# Patient Record
Sex: Male | Born: 1940 | Race: White | Hispanic: No | Smoking: Former smoker
Health system: Southern US, Community
[De-identification: ages and names within clinical notes are randomized; demographics above are authoritative.]

## PROBLEM LIST (undated history)

## (undated) DIAGNOSIS — M545 Low back pain, unspecified: Secondary | ICD-10-CM

## (undated) DIAGNOSIS — I10 Essential (primary) hypertension: Secondary | ICD-10-CM

## (undated) DIAGNOSIS — E785 Hyperlipidemia, unspecified: Secondary | ICD-10-CM

## (undated) DIAGNOSIS — M109 Gout, unspecified: Secondary | ICD-10-CM

## (undated) DIAGNOSIS — N182 Chronic kidney disease, stage 2 (mild): Secondary | ICD-10-CM

## (undated) DIAGNOSIS — K8020 Calculus of gallbladder without cholecystitis without obstruction: Secondary | ICD-10-CM

## (undated) DIAGNOSIS — F32A Depression, unspecified: Secondary | ICD-10-CM

## (undated) DIAGNOSIS — E119 Type 2 diabetes mellitus without complications: Secondary | ICD-10-CM

## (undated) DIAGNOSIS — K760 Fatty (change of) liver, not elsewhere classified: Secondary | ICD-10-CM

## (undated) DIAGNOSIS — F329 Major depressive disorder, single episode, unspecified: Secondary | ICD-10-CM

## (undated) DIAGNOSIS — E669 Obesity, unspecified: Secondary | ICD-10-CM

## (undated) DIAGNOSIS — M25559 Pain in unspecified hip: Secondary | ICD-10-CM

## (undated) DIAGNOSIS — J90 Pleural effusion, not elsewhere classified: Secondary | ICD-10-CM

## (undated) HISTORY — DX: Essential (primary) hypertension: I10

## (undated) HISTORY — DX: Obesity, unspecified: E66.9

## (undated) HISTORY — DX: Chronic kidney disease, stage 2 (mild): N18.2

## (undated) HISTORY — DX: Low back pain: M54.5

## (undated) HISTORY — DX: Hyperlipidemia, unspecified: E78.5

## (undated) HISTORY — DX: Low back pain, unspecified: M54.50

## (undated) HISTORY — DX: Major depressive disorder, single episode, unspecified: F32.9

## (undated) HISTORY — DX: Type 2 diabetes mellitus without complications: E11.9

## (undated) HISTORY — DX: Pain in unspecified hip: M25.559

## (undated) HISTORY — DX: Gout, unspecified: M10.9

## (undated) HISTORY — DX: Depression, unspecified: F32.A

## (undated) HISTORY — PX: APPENDECTOMY: SHX54

---

## 1996-04-17 HISTORY — PX: CYST EXCISION: SHX5701

## 2005-01-03 ENCOUNTER — Ambulatory Visit: Payer: Self-pay | Admitting: Internal Medicine

## 2008-03-16 ENCOUNTER — Encounter: Admission: RE | Admit: 2008-03-16 | Discharge: 2008-03-16 | Payer: Self-pay | Admitting: Internal Medicine

## 2009-04-17 HISTORY — PX: TOTAL NEPHRECTOMY: SHX415

## 2009-08-19 ENCOUNTER — Encounter: Admission: RE | Admit: 2009-08-19 | Discharge: 2009-08-19 | Payer: Self-pay | Admitting: Internal Medicine

## 2009-08-30 ENCOUNTER — Encounter: Admission: RE | Admit: 2009-08-30 | Discharge: 2009-08-30 | Payer: Self-pay | Admitting: Internal Medicine

## 2009-09-29 ENCOUNTER — Encounter: Admission: RE | Admit: 2009-09-29 | Discharge: 2009-09-29 | Payer: Self-pay | Admitting: Urology

## 2009-10-06 ENCOUNTER — Encounter: Payer: Self-pay | Admitting: Urology

## 2009-10-06 ENCOUNTER — Inpatient Hospital Stay (HOSPITAL_COMMUNITY): Admission: RE | Admit: 2009-10-06 | Discharge: 2009-10-09 | Payer: Self-pay | Admitting: Urology

## 2010-05-03 ENCOUNTER — Encounter
Admission: RE | Admit: 2010-05-03 | Discharge: 2010-05-03 | Payer: Self-pay | Source: Home / Self Care | Attending: Internal Medicine | Admitting: Internal Medicine

## 2010-07-03 LAB — BASIC METABOLIC PANEL
BUN: 16 mg/dL (ref 6–23)
BUN: 25 mg/dL — ABNORMAL HIGH (ref 6–23)
CO2: 28 mEq/L (ref 19–32)
CO2: 31 mEq/L (ref 19–32)
Calcium: 10 mg/dL (ref 8.4–10.5)
Calcium: 8.1 mg/dL — ABNORMAL LOW (ref 8.4–10.5)
Calcium: 8.8 mg/dL (ref 8.4–10.5)
Chloride: 98 mEq/L (ref 96–112)
Creatinine, Ser: 0.91 mg/dL (ref 0.4–1.5)
Creatinine, Ser: 1.8 mg/dL — ABNORMAL HIGH (ref 0.4–1.5)
GFR calc Af Amer: 46 mL/min — ABNORMAL LOW (ref >60)
GFR calc Af Amer: 52 mL/min — ABNORMAL LOW (ref >60)
GFR calc Af Amer: >60 mL/min (ref >60)
GFR calc non Af Amer: 43 mL/min — ABNORMAL LOW (ref >60)
GFR calc non Af Amer: >60 mL/min (ref >60)
Sodium: 141 mEq/L (ref 135–145)

## 2010-07-03 LAB — GLUCOSE, CAPILLARY
Glucose-Capillary: 116 mg/dL — ABNORMAL HIGH (ref 70–99)
Glucose-Capillary: 119 mg/dL — ABNORMAL HIGH (ref 70–99)
Glucose-Capillary: 121 mg/dL — ABNORMAL HIGH (ref 70–99)
Glucose-Capillary: 126 mg/dL — ABNORMAL HIGH (ref 70–99)
Glucose-Capillary: 143 mg/dL — ABNORMAL HIGH (ref 70–99)
Glucose-Capillary: 164 mg/dL — ABNORMAL HIGH (ref 70–99)
Glucose-Capillary: 184 mg/dL — ABNORMAL HIGH (ref 70–99)

## 2010-07-03 LAB — CBC
HCT: 42.5 % (ref 39.0–52.0)
HCT: 47.3 % (ref 39.0–52.0)
Hemoglobin: 14.3 g/dL (ref 13.0–17.0)
Hemoglobin: 15.6 g/dL (ref 13.0–17.0)
MCH: 30.3 pg (ref 26.0–34.0)
MCH: 30.5 pg (ref 26.0–34.0)
MCHC: 33 g/dL (ref 30.0–36.0)
MCHC: 33.3 g/dL (ref 30.0–36.0)
MCV: 90.7 fL (ref 78.0–100.0)
MCV: 91 fL (ref 78.0–100.0)
MCV: 91.1 fL (ref 78.0–100.0)
Platelets: 192 10*3/uL (ref 150–400)
Platelets: 211 10*3/uL (ref 150–400)
Platelets: 218 10*3/uL (ref 150–400)
RDW: 13.7 % (ref 11.5–15.5)
RDW: 14 % (ref 11.5–15.5)
WBC: 6.8 10*3/uL (ref 4.0–10.5)

## 2010-07-03 LAB — SURGICAL PCR SCREEN: Staphylococcus aureus: NEGATIVE

## 2010-07-03 LAB — DIFFERENTIAL
Eosinophils Relative: 0 % (ref 0–5)
Lymphs Abs: 0.5 10*3/uL — ABNORMAL LOW (ref 0.7–4.0)
Monocytes Absolute: 0.9 10*3/uL (ref 0.1–1.0)
Neutro Abs: 9.7 10*3/uL — ABNORMAL HIGH (ref 1.7–7.7)
Neutrophils Relative %: 87 % — ABNORMAL HIGH (ref 43–77)

## 2011-11-28 IMAGING — CR DG ORBITS FOR FOREIGN BODY
2 series · 2 of 2 positions shown · non-contrast
Comparison: None available.

CLINICAL DATA: Metal exposure.

ORBITS FOR FOREIGN BODY - 2 VIEW

[view not recorded (1 of 2)]
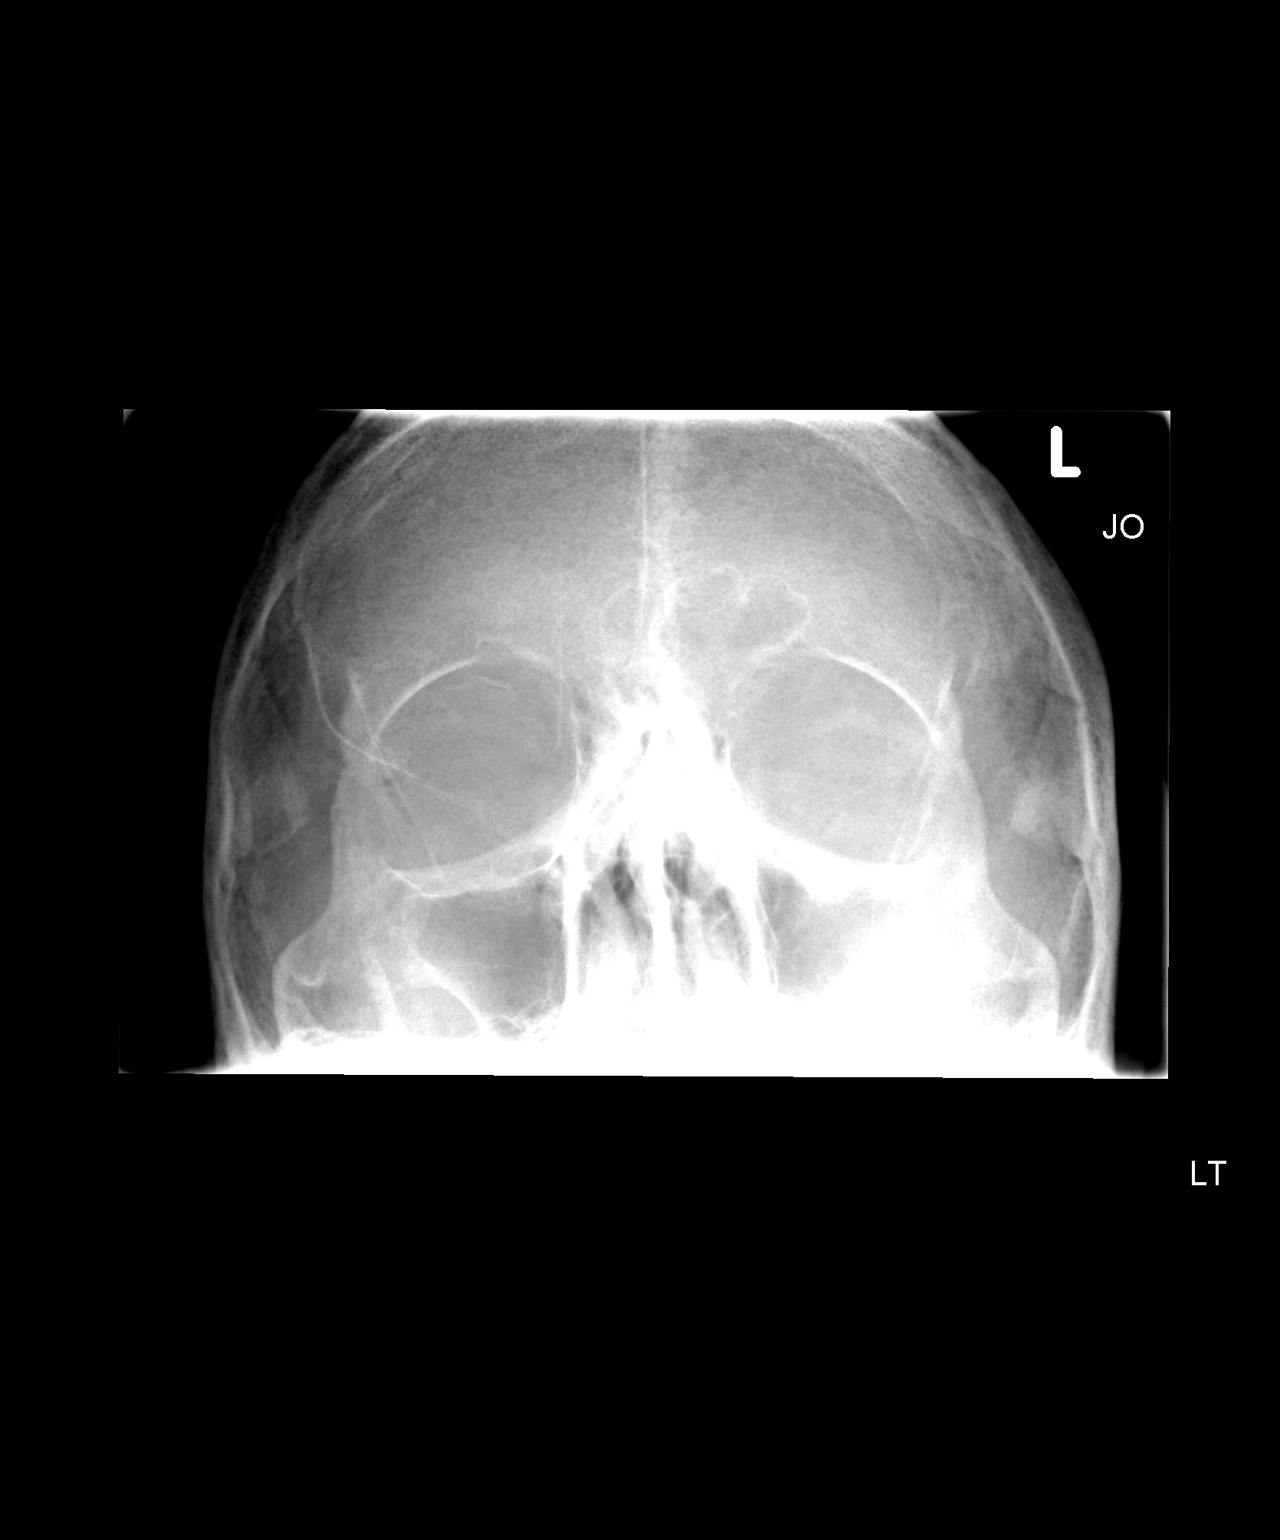

[view not recorded (2 of 2)]
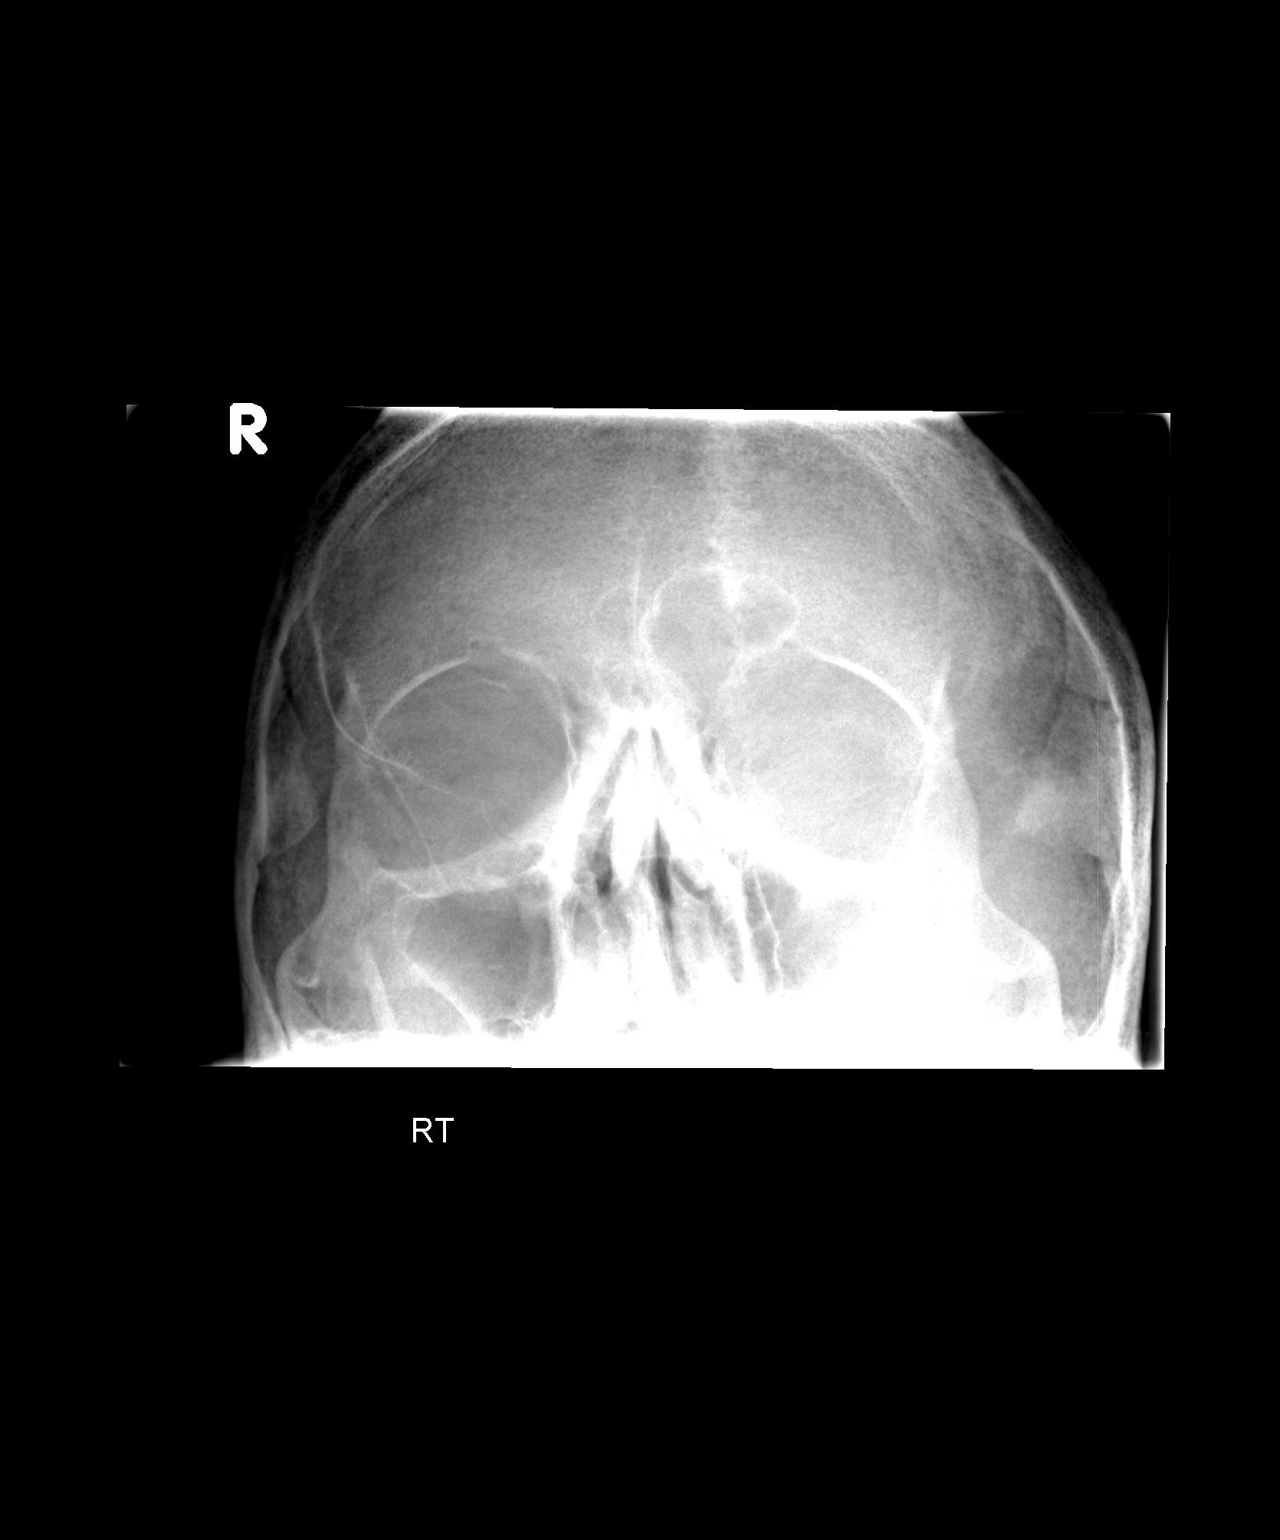

[2 of 2 positions shown; findings below may reference images not displayed]

FINDINGS: Two views the orbits demonstrate no radiopaque foreign
bodies.  The visualized paranasal sinuses are clear.  The left
maxillary sinus is smaller than the right, suggesting probable
chronic disease.
IMPRESSION: No radiopaque foreign body.

## 2012-07-16 ENCOUNTER — Other Ambulatory Visit: Payer: Medicare Other

## 2012-07-16 ENCOUNTER — Other Ambulatory Visit: Payer: Self-pay | Admitting: Geriatric Medicine

## 2012-07-16 DIAGNOSIS — E785 Hyperlipidemia, unspecified: Secondary | ICD-10-CM

## 2012-07-16 DIAGNOSIS — E119 Type 2 diabetes mellitus without complications: Secondary | ICD-10-CM

## 2012-07-16 DIAGNOSIS — N182 Chronic kidney disease, stage 2 (mild): Secondary | ICD-10-CM

## 2012-07-16 DIAGNOSIS — M109 Gout, unspecified: Secondary | ICD-10-CM

## 2012-07-17 LAB — COMPREHENSIVE METABOLIC PANEL
ALT: 14 IU/L (ref 0–44)
Albumin/Globulin Ratio: 1.8 (ref 1.1–2.5)
Alkaline Phosphatase: 66 IU/L (ref 39–117)
BUN/Creatinine Ratio: 18 (ref 10–22)
GFR calc Af Amer: 61 mL/min/{1.73_m2} (ref >59)
GFR calc non Af Amer: 52 mL/min/{1.73_m2} — ABNORMAL LOW (ref >59)
Glucose: 185 mg/dL — ABNORMAL HIGH (ref 65–99)
Potassium: 4.3 mmol/L (ref 3.5–5.2)
Total Bilirubin: 0.5 mg/dL (ref 0.0–1.2)
Total Protein: 6.2 g/dL (ref 6.0–8.5)

## 2012-07-17 LAB — URIC ACID: Uric Acid: 5.2 mg/dL (ref 3.7–8.6)

## 2012-07-17 LAB — HEMOGLOBIN A1C
Est. average glucose Bld gHb Est-mCnc: 160 mg/dL
Hgb A1c MFr Bld: 7.2 % — ABNORMAL HIGH (ref 4.8–5.6)

## 2012-07-17 LAB — LIPID PANEL
LDL Calculated: 119 mg/dL — ABNORMAL HIGH (ref 0–99)
VLDL Cholesterol Cal: 27 mg/dL (ref 5–40)

## 2012-07-23 ENCOUNTER — Other Ambulatory Visit: Payer: Self-pay | Admitting: *Deleted

## 2012-07-23 MED ORDER — HYDROCODONE-ACETAMINOPHEN 10-325 MG PO TABS: 1.0000 | ORAL_TABLET | Freq: Four times a day (QID) | ORAL | Status: DC | PRN

## 2012-07-24 ENCOUNTER — Ambulatory Visit: Payer: Medicare Other | Admitting: Internal Medicine

## 2012-08-14 ENCOUNTER — Encounter: Payer: Self-pay | Admitting: Geriatric Medicine

## 2012-08-14 ENCOUNTER — Ambulatory Visit: Payer: Medicare Other | Admitting: Internal Medicine

## 2012-08-15 ENCOUNTER — Ambulatory Visit: Payer: Medicare Other | Admitting: Internal Medicine

## 2012-08-26 ENCOUNTER — Other Ambulatory Visit: Payer: Self-pay | Admitting: *Deleted

## 2012-08-26 MED ORDER — HYDROCODONE-ACETAMINOPHEN 10-325 MG PO TABS: 1.0000 | ORAL_TABLET | Freq: Four times a day (QID) | ORAL | Status: DC | PRN

## 2012-09-17 ENCOUNTER — Other Ambulatory Visit: Payer: Self-pay | Admitting: *Deleted

## 2012-09-17 MED ORDER — DOXAZOSIN MESYLATE 8 MG PO TABS: ORAL_TABLET | ORAL | Status: DC

## 2012-10-22 ENCOUNTER — Other Ambulatory Visit: Payer: Self-pay | Admitting: Geriatric Medicine

## 2012-10-22 MED ORDER — OLMESARTAN-AMLODIPINE-HCTZ 40-10-25 MG PO TABS: ORAL_TABLET | ORAL | Status: DC

## 2012-10-22 MED ORDER — METFORMIN HCL 500 MG PO TABS: ORAL_TABLET | ORAL | Status: DC

## 2012-11-14 ENCOUNTER — Other Ambulatory Visit: Payer: Self-pay | Admitting: Geriatric Medicine

## 2012-11-14 DIAGNOSIS — E119 Type 2 diabetes mellitus without complications: Secondary | ICD-10-CM

## 2012-11-14 DIAGNOSIS — M109 Gout, unspecified: Secondary | ICD-10-CM

## 2012-11-14 DIAGNOSIS — E785 Hyperlipidemia, unspecified: Secondary | ICD-10-CM

## 2012-11-14 DIAGNOSIS — N182 Chronic kidney disease, stage 2 (mild): Secondary | ICD-10-CM

## 2012-11-15 ENCOUNTER — Other Ambulatory Visit: Payer: Medicare Other

## 2012-11-15 DIAGNOSIS — N182 Chronic kidney disease, stage 2 (mild): Secondary | ICD-10-CM

## 2012-11-15 DIAGNOSIS — M109 Gout, unspecified: Secondary | ICD-10-CM

## 2012-11-15 DIAGNOSIS — E119 Type 2 diabetes mellitus without complications: Secondary | ICD-10-CM

## 2012-11-15 DIAGNOSIS — E785 Hyperlipidemia, unspecified: Secondary | ICD-10-CM

## 2012-11-16 LAB — COMPREHENSIVE METABOLIC PANEL
AST: 16 IU/L (ref 0–40)
Alkaline Phosphatase: 63 IU/L (ref 39–117)
BUN/Creatinine Ratio: 16 (ref 10–22)
BUN: 26 mg/dL (ref 8–27)
CO2: 26 mmol/L (ref 18–29)
Chloride: 98 mmol/L (ref 97–108)
Sodium: 139 mmol/L (ref 134–144)

## 2012-11-16 LAB — URIC ACID: Uric Acid: 10.2 mg/dL — ABNORMAL HIGH (ref 3.7–8.6)

## 2012-11-16 LAB — LIPID PANEL
Cholesterol, Total: 189 mg/dL (ref 100–199)
Triglycerides: 140 mg/dL (ref 0–149)

## 2012-11-20 ENCOUNTER — Encounter: Payer: Self-pay | Admitting: Internal Medicine

## 2012-11-20 ENCOUNTER — Ambulatory Visit (INDEPENDENT_AMBULATORY_CARE_PROVIDER_SITE_OTHER): Payer: Medicare Other | Admitting: Internal Medicine

## 2012-11-20 VITALS — BP 190/70 | HR 69 | Temp 97.4°F | Resp 18 | Wt 232.4 lb

## 2012-11-20 DIAGNOSIS — M25559 Pain in unspecified hip: Secondary | ICD-10-CM

## 2012-11-20 DIAGNOSIS — M25551 Pain in right hip: Secondary | ICD-10-CM

## 2012-11-20 DIAGNOSIS — E785 Hyperlipidemia, unspecified: Secondary | ICD-10-CM

## 2012-11-20 DIAGNOSIS — M109 Gout, unspecified: Secondary | ICD-10-CM | POA: Insufficient documentation

## 2012-11-20 DIAGNOSIS — M25552 Pain in left hip: Secondary | ICD-10-CM | POA: Insufficient documentation

## 2012-11-20 DIAGNOSIS — N179 Acute kidney failure, unspecified: Secondary | ICD-10-CM | POA: Insufficient documentation

## 2012-11-20 DIAGNOSIS — E119 Type 2 diabetes mellitus without complications: Secondary | ICD-10-CM

## 2012-11-20 DIAGNOSIS — E669 Obesity, unspecified: Secondary | ICD-10-CM

## 2012-11-20 DIAGNOSIS — I10 Essential (primary) hypertension: Secondary | ICD-10-CM | POA: Insufficient documentation

## 2012-11-20 DIAGNOSIS — M545 Low back pain: Secondary | ICD-10-CM

## 2012-11-20 DIAGNOSIS — N182 Chronic kidney disease, stage 2 (mild): Secondary | ICD-10-CM

## 2012-11-20 NOTE — Patient Instructions (Addendum)
Continue current medications. Resume allopurinol to prevent gout. Try Aleve twice daily to help hip pain.

## 2012-11-20 NOTE — Progress Notes (Signed)
Subjective:    Patient ID: Douglas Soto, male    DOB: 26-Nov-1940, 72 y.o.   MRN: [id number]  HPI   Gout, unspecified: he sopped the allopurinol. Did not understaqnd it will prevent gout attaqcks.  Chronic kidney disease, stage II (mild): stable  Essential hypertension, malignant; continues with elevated SBP in office, but it is ,140 at home.  Lumbago: chronic and unchanged.  Diabetes mellitus without complication: adequate control  Hyperlipidemia: controlled  Obesity, unspecified: unchanged  Hip pain, bilateral: Hurting more in his hips. Cannot walk more than 100' without having to stop due to pain. Both hips affected.   Current Outpatient Prescriptions on File Prior to Visit  Medication Sig Dispense Refill  . allopurinol (ZYLOPRIM) 300 MG tablet       . doxazosin (CARDURA) 8 MG tablet Take one tablet by mouth once daily for blood pressure  30 tablet  6  . HYDROcodone-acetaminophen (NORCO) 10-325 MG per tablet Take 1 tablet by mouth every 6 (six) hours as needed for pain.  120 tablet  0  . metFORMIN (GLUCOPHAGE) 500 MG tablet Take one tablet by mouth once daily to control diabetes.  30 tablet  3  . multivitamin-iron-minerals-folic acid (CENTRUM) chewable tablet Chew 1 tablet by mouth daily.      . Olmesartan-Amlodipine-HCTZ (TRIBENZOR) 40-10-25 MG TABS Take one tablet by mouth once daily for blood pressure.  30 tablet  3   No current facility-administered medications on file prior to visit.    Review of Systems  Constitutional: Negative.   HENT: Negative.   Eyes: Negative.   Respiratory: Negative.   Cardiovascular: Negative for chest pain, palpitations and leg swelling.  Gastrointestinal: Negative.   Endocrine: Negative for heat intolerance, polydipsia, polyphagia and polyuria.       Diabetic  Genitourinary: Negative.   Musculoskeletal: Positive for back pain.       Hx gout  Skin: Negative.   Neurological: Negative.   Hematological: Negative.    Psychiatric/Behavioral: Negative.        Objective:BP 190/70  Pulse 69  Temp(Src) 97.4 F (36.3 C) (Oral)  Resp 18  Wt 232 lb 6.4 oz (105.416 kg)  SpO2 95%    Physical Exam  Constitutional: He is oriented to person, place, and time. No distress.  overweight  HENT:  Head: Normocephalic and atraumatic.  Right Ear: External ear normal.  Left Ear: External ear normal.  Eyes: Conjunctivae and EOM are normal. Pupils are equal, round, and reactive to light.  Neck: Normal range of motion. Neck supple. No JVD present. No tracheal deviation present. No thyromegaly present.  Cardiovascular: Normal rate, regular rhythm, normal heart sounds and intact distal pulses.  Exam reveals no gallop and no friction rub.   No murmur heard. Pulmonary/Chest: Effort normal and breath sounds normal. No respiratory distress. He has no wheezes. He has no rales.  Abdominal: Soft. Bowel sounds are normal. He exhibits no distension and no mass. There is no tenderness.  Musculoskeletal: Normal range of motion. He exhibits no edema.  Tender in lower back and hips.  Lymphadenopathy:    He has no cervical adenopathy.  Neurological: He is alert and oriented to person, place, and time. He has normal reflexes. No cranial nerve deficit. Coordination normal.  Skin: No rash noted. No erythema. No pallor.  Psychiatric: He has a normal mood and affect. His behavior is normal. Judgment and thought content normal.      Appointment on 11/15/2012  Component Date Value Range Status  . Glucose  11/15/2012 91  65 - 99 mg/dL Final  . BUN 78/29/5621 26  8 - 27 mg/dL Final  . Creatinine, Ser 11/15/2012 1.62* 0.76 - 1.27 mg/dL Final  . GFR calc non Af Amer 11/15/2012 42* >59 mL/min/1.73 Final  . GFR calc Af Amer 11/15/2012 49* >59 mL/min/1.73 Final  . BUN/Creatinine Ratio 11/15/2012 16  10 - 22 Final  . Sodium 11/15/2012 139  134 - 144 mmol/L Final  . Potassium 11/15/2012 4.1  3.5 - 5.2 mmol/L Final  . Chloride 11/15/2012 98   97 - 108 mmol/L Final  . CO2 11/15/2012 26  18 - 29 mmol/L Final  . Calcium 11/15/2012 10.0  8.6 - 10.2 mg/dL Final  . Total Protein 11/15/2012 6.5  6.0 - 8.5 g/dL Final  . Albumin 30/86/5784 4.1  3.5 - 4.8 g/dL Final  . Globulin, Total 11/15/2012 2.4  1.5 - 4.5 g/dL Final  . Albumin/Globulin Ratio 11/15/2012 1.7  1.1 - 2.5 Final  . Total Bilirubin 11/15/2012 0.4  0.0 - 1.2 mg/dL Final  . Alkaline Phosphatase 11/15/2012 63  39 - 117 IU/L Final  . AST 11/15/2012 16  0 - 40 IU/L Final  . ALT 11/15/2012 19  0 - 44 IU/L Final  . Cholesterol, Total 11/15/2012 189  100 - 199 mg/dL Final  . Triglycerides 11/15/2012 140  0 - 149 mg/dL Final  . HDL 69/62/9528 40  >39 mg/dL Final   Comment: According to ATP-III Guidelines, HDL-C >59 mg/dL is considered a                          negative risk factor for CHD.  Marland Kitchen VLDL Cholesterol Cal 11/15/2012 28  5 - 40 mg/dL Final  . LDL Calculated 11/15/2012 413* 0 - 99 mg/dL Final  . Chol/HDL Ratio 11/15/2012 4.7  0.0 - 5.0 ratio units Final  . Hemoglobin A1C 11/15/2012 7.1* 4.8 - 5.6 % Final   Comment:          Increased risk for diabetes: 5.7 - 6.4                                   Diabetes: >6.4                                   Glycemic control for adults with diabetes: <7.0  . Estimated average glucose 11/15/2012 157   Final  . Uric Acid 11/15/2012 10.2* 3.7 - 8.6 mg/dL Final              Therapeutic target for gout patients: <6.0       Assessment & Plan:  Gout, unspecified: resume allopurinol - Plan: Uric Acid  Chronic kidney disease, stage II (mild) - Plan: Basic metabolic panel  Essential hypertension, malignant: stable  Lumbago: continue pain meds as needed  Diabetes mellitus without complication - Plan: Hemoglobin A1c, Microalbumin/Creatinine Ratio, Urine  Hyperlipidemia - Plan: Lipid panel  Obesity, unspecified: encouraged weight loss  Hip pain, bilateral - Plan: Ambulatory referral to Orthopedic Surgery

## 2012-12-18 ENCOUNTER — Other Ambulatory Visit: Payer: Self-pay

## 2012-12-18 MED ORDER — HYDROCODONE-ACETAMINOPHEN 10-325 MG PO TABS: 1.0000 | ORAL_TABLET | Freq: Four times a day (QID) | ORAL | Status: DC | PRN

## 2013-01-15 ENCOUNTER — Other Ambulatory Visit: Payer: Self-pay | Admitting: *Deleted

## 2013-01-15 MED ORDER — ALLOPURINOL 300 MG PO TABS: 300.0000 mg | ORAL_TABLET | Freq: Every day | ORAL | Status: DC

## 2013-01-23 ENCOUNTER — Other Ambulatory Visit: Payer: Self-pay | Admitting: *Deleted

## 2013-01-23 MED ORDER — HYDROCODONE-ACETAMINOPHEN 10-325 MG PO TABS: 1.0000 | ORAL_TABLET | Freq: Four times a day (QID) | ORAL | Status: DC | PRN

## 2013-02-16 ENCOUNTER — Other Ambulatory Visit: Payer: Self-pay | Admitting: Internal Medicine

## 2013-02-20 ENCOUNTER — Other Ambulatory Visit: Payer: Self-pay | Admitting: *Deleted

## 2013-02-20 MED ORDER — HYDROCODONE-ACETAMINOPHEN 10-325 MG PO TABS: 1.0000 | ORAL_TABLET | Freq: Four times a day (QID) | ORAL | Status: DC | PRN

## 2013-03-17 ENCOUNTER — Other Ambulatory Visit: Payer: Medicare Other

## 2013-03-19 ENCOUNTER — Ambulatory Visit: Payer: Medicare Other | Admitting: Internal Medicine

## 2013-03-27 ENCOUNTER — Other Ambulatory Visit: Payer: Self-pay | Admitting: *Deleted

## 2013-03-27 MED ORDER — HYDROCODONE-ACETAMINOPHEN 10-325 MG PO TABS: 1.0000 | ORAL_TABLET | Freq: Four times a day (QID) | ORAL | Status: DC | PRN

## 2013-03-28 ENCOUNTER — Other Ambulatory Visit: Payer: Self-pay | Admitting: Internal Medicine

## 2013-04-24 ENCOUNTER — Other Ambulatory Visit: Payer: Self-pay | Admitting: *Deleted

## 2013-04-24 MED ORDER — HYDROCODONE-ACETAMINOPHEN 10-325 MG PO TABS
1.0000 | ORAL_TABLET | Freq: Four times a day (QID) | ORAL | Status: DC | PRN
Start: 2013-04-24 — End: 2013-05-23

## 2013-05-23 ENCOUNTER — Other Ambulatory Visit: Payer: Self-pay | Admitting: *Deleted

## 2013-05-23 MED ORDER — HYDROCODONE-ACETAMINOPHEN 10-325 MG PO TABS: 1.0000 | ORAL_TABLET | Freq: Four times a day (QID) | ORAL | Status: DC | PRN

## 2013-05-24 ENCOUNTER — Other Ambulatory Visit: Payer: Self-pay | Admitting: Internal Medicine

## 2013-06-10 ENCOUNTER — Other Ambulatory Visit: Payer: Medicare Other

## 2013-06-10 DIAGNOSIS — E119 Type 2 diabetes mellitus without complications: Secondary | ICD-10-CM

## 2013-06-10 DIAGNOSIS — E785 Hyperlipidemia, unspecified: Secondary | ICD-10-CM

## 2013-06-10 DIAGNOSIS — M109 Gout, unspecified: Secondary | ICD-10-CM

## 2013-06-10 DIAGNOSIS — N182 Chronic kidney disease, stage 2 (mild): Secondary | ICD-10-CM

## 2013-06-11 LAB — URIC ACID: Uric Acid: 5.4 mg/dL (ref 3.7–8.6)

## 2013-06-11 LAB — BASIC METABOLIC PANEL
BUN / CREAT RATIO: 15 (ref 10–22)
BUN: 20 mg/dL (ref 8–27)
CHLORIDE: 94 mmol/L — AB (ref 97–108)
CO2: 31 mmol/L — ABNORMAL HIGH (ref 18–29)
CREATININE: 1.31 mg/dL — AB (ref 0.76–1.27)
Calcium: 10.5 mg/dL — ABNORMAL HIGH (ref 8.6–10.2)
GFR calc non Af Amer: 54 mL/min/{1.73_m2} — ABNORMAL LOW (ref >59)
GFR, EST AFRICAN AMERICAN: 63 mL/min/{1.73_m2} (ref >59)
Glucose: 289 mg/dL — ABNORMAL HIGH (ref 65–99)
Potassium: 3.8 mmol/L (ref 3.5–5.2)
Sodium: 140 mmol/L (ref 134–144)

## 2013-06-11 LAB — HEMOGLOBIN A1C
Est. average glucose Bld gHb Est-mCnc: 209 mg/dL
Hgb A1c MFr Bld: 8.9 % — ABNORMAL HIGH (ref 4.8–5.6)

## 2013-06-11 LAB — LIPID PANEL
CHOL/HDL RATIO: 5.6 ratio — AB (ref 0.0–5.0)
Cholesterol, Total: 217 mg/dL — ABNORMAL HIGH (ref 100–199)
HDL: 39 mg/dL — AB (ref >39)
LDL CALC: 124 mg/dL — AB (ref 0–99)
TRIGLYCERIDES: 270 mg/dL — AB (ref 0–149)
VLDL Cholesterol Cal: 54 mg/dL — ABNORMAL HIGH (ref 5–40)

## 2013-06-13 ENCOUNTER — Other Ambulatory Visit: Payer: Medicare Other

## 2013-06-17 ENCOUNTER — Encounter: Payer: Self-pay | Admitting: Internal Medicine

## 2013-06-17 ENCOUNTER — Ambulatory Visit (INDEPENDENT_AMBULATORY_CARE_PROVIDER_SITE_OTHER): Payer: Medicare Other | Admitting: Internal Medicine

## 2013-06-17 VITALS — BP 160/78 | HR 73 | Temp 97.3°F | Wt 239.0 lb

## 2013-06-17 DIAGNOSIS — N182 Chronic kidney disease, stage 2 (mild): Secondary | ICD-10-CM

## 2013-06-17 DIAGNOSIS — I10 Essential (primary) hypertension: Secondary | ICD-10-CM

## 2013-06-17 DIAGNOSIS — E119 Type 2 diabetes mellitus without complications: Secondary | ICD-10-CM

## 2013-06-17 DIAGNOSIS — E785 Hyperlipidemia, unspecified: Secondary | ICD-10-CM

## 2013-06-17 DIAGNOSIS — M545 Low back pain, unspecified: Secondary | ICD-10-CM

## 2013-06-17 MED ORDER — HYDROCODONE-ACETAMINOPHEN 10-325 MG PO TABS: ORAL_TABLET | ORAL | Status: DC

## 2013-06-17 NOTE — Progress Notes (Signed)
Patient ID: Douglas Soto, male   DOB: 09-25-1940, 73 y.o.   MRN: [id number]    Location:    [facility]  Place of Service:  OFFICE    No Known Allergies  Chief Complaint  Patient presents with  . Medical Managment of Chronic Issues    4 month follow-up   . Sinus Problem    Ongoing sinus problem   . Tinnitus    Right ear ringing and ? if clogged     HPI:  Lumbago : chronic. unchanged  Essential hypertension, malignant: improved  Diabetes mellitus without complication: poor control. Gaining weight. Eats sweets and pre-made food  Chronic kidney disease, stage II (mild) : stable  Hyperlipidemia -: stable  Depression: "I don't care about anything". Goes to bar about twice weekly to drink with friends. Stays at home when his children invite him over.    Medications: Patient's Medications  New Prescriptions   No medications on file  Previous Medications   ALLOPURINOL (ZYLOPRIM) 300 MG TABLET    Take 1 tablet (300 mg total) by mouth daily.   DOXAZOSIN (CARDURA) 8 MG TABLET    take 1 tablet by mouth once daily for blood pressure   HYDROCODONE-ACETAMINOPHEN (NORCO) 10-325 MG PER TABLET    Take 1 tablet by mouth every 6 (six) hours as needed.   METFORMIN (GLUCOPHAGE) 500 MG TABLET    take 1 tablet by mouth once daily for diabetes CONTROL   MULTIVITAMIN-IRON-MINERALS-FOLIC ACID (CENTRUM) CHEWABLE TABLET    Chew 1 tablet by mouth daily.   TRIBENZOR 40-10-25 MG TABS    take 1 tablet by mouth once daily for blood pressure  Modified Medications   No medications on file  Discontinued Medications   No medications on file     Review of Systems  Constitutional: Negative.   HENT: Negative.   Eyes: Negative.   Respiratory: Negative.   Cardiovascular: Negative for chest pain, palpitations and leg swelling.  Gastrointestinal: Negative.   Endocrine: Negative for heat intolerance, polydipsia, polyphagia and polyuria.       Diabetic  Genitourinary: Negative.   Musculoskeletal: Positive  for back pain.       Hx gout  Skin: Negative.   Neurological: Negative.   Hematological: Negative.   Psychiatric/Behavioral: Negative.     Filed Vitals:   06/17/13 1501  BP: 160/78  Pulse: 73  Temp: 97.3 F (36.3 C)  TempSrc: Oral  Weight: 239 lb (108.41 kg)  SpO2: 98%   Physical Exam  Constitutional: He is oriented to person, place, and time. No distress.  overweight  HENT:  Head: Normocephalic and atraumatic.  Right Ear: External ear normal.  Left Ear: External ear normal.  Eyes: Conjunctivae and EOM are normal. Pupils are equal, round, and reactive to light.  Neck: Normal range of motion. Neck supple. No JVD present. No tracheal deviation present. No thyromegaly present.  Cardiovascular: Normal rate, regular rhythm, normal heart sounds and intact distal pulses.  Exam reveals no gallop and no friction rub.   No murmur heard. Pulmonary/Chest: Effort normal and breath sounds normal. No respiratory distress. He has no wheezes. He has no rales.  Abdominal: Soft. Bowel sounds are normal. He exhibits no distension and no mass. There is no tenderness.  Musculoskeletal: Normal range of motion. He exhibits no edema.  Tender in lower back and hips.  Lymphadenopathy:    He has no cervical adenopathy.  Neurological: He is alert and oriented to person, place, and time. He has normal reflexes. No  cranial nerve deficit. Coordination normal.  Skin: No rash noted. No erythema. No pallor.  Psychiatric: He has a normal mood and affect. His behavior is normal. Judgment and thought content normal.     Labs reviewed: Appointment on 06/10/2013  Component Date Value Ref Range Status  . Uric Acid 06/10/2013 5.4  3.7 - 8.6 mg/dL Final              Therapeutic target for gout patients: <6.0  . Glucose 06/10/2013 289* 65 - 99 mg/dL Final  . BUN 06/10/2013 20  8 - 27 mg/dL Final  . Creatinine, Ser 06/10/2013 1.31* 0.76 - 1.27 mg/dL Final  . GFR calc non Af Amer 06/10/2013 54* >59 mL/min/1.73  Final  . GFR calc Af Amer 06/10/2013 63  >59 mL/min/1.73 Final  . BUN/Creatinine Ratio 06/10/2013 15  10 - 22 Final  . Sodium 06/10/2013 140  134 - 144 mmol/L Final  . Potassium 06/10/2013 3.8  3.5 - 5.2 mmol/L Final  . Chloride 06/10/2013 94* 97 - 108 mmol/L Final  . CO2 06/10/2013 31* 18 - 29 mmol/L Final  . Calcium 06/10/2013 10.5* 8.6 - 10.2 mg/dL Final  . Cholesterol, Total 06/10/2013 217* 100 - 199 mg/dL Final  . Triglycerides 06/10/2013 270* 0 - 149 mg/dL Final  . HDL 06/10/2013 39* >39 mg/dL Final   Comment: According to ATP-III Guidelines, HDL-C >59 mg/dL is considered a                          negative risk factor for CHD.  Marland Kitchen VLDL Cholesterol Cal 06/10/2013 54* 5 - 40 mg/dL Final  . LDL Calculated 06/10/2013 124* 0 - 99 mg/dL Final  . Chol/HDL Ratio 06/10/2013 5.6* 0.0 - 5.0 ratio units Final   Comment:                                   T. Chol/HDL Ratio                                                                      Men  Women                                                        1/2 Avg.Risk  3.4    3.3                                                            Avg.Risk  5.0    4.4  2X Avg.Risk  9.6    7.1                                                         3X Avg.Risk 23.4   11.0  . Hemoglobin A1C 06/10/2013 8.9* 4.8 - 5.6 % Final   Comment:          Increased risk for diabetes: 5.7 - 6.4                                   Diabetes: >6.4                                   Glycemic control for adults with diabetes: <7.0  . Estimated average glucose 06/10/2013 209   Final      Assessment/Plan  1. Lumbago - HYDROcodone-acetaminophen (NORCO) 10-325 MG per tablet; One every 6 hours as needed for pain control  Dispense: 120 tablet; Refill: 0  2. Essential hypertension, malignant Continue current medications  3. Diabetes mellitus without complication Continue current meds. Focus on weight loss. Choose better  foods for diet. - Hemoglobin A1c; Future  4. Chronic kidney disease, stage II (mild) stable - Comprehensive metabolic panel; Future  5. Hyperlipidemia stable - Lipid panel; Future

## 2013-06-17 NOTE — Patient Instructions (Signed)
Take medication as listed. 

## 2013-06-18 LAB — MICROALBUMIN / CREATININE URINE RATIO
Creatinine, Ur: 135.3 mg/dL (ref 22.0–328.0)
MICROALB/CREAT RATIO: 96.8 mg/g creat — ABNORMAL HIGH (ref 0.0–30.0)
Microalbumin, Urine: 131 ug/mL — ABNORMAL HIGH (ref 0.0–17.0)

## 2013-06-23 ENCOUNTER — Other Ambulatory Visit: Payer: Self-pay | Admitting: *Deleted

## 2013-06-23 MED ORDER — METFORMIN HCL 500 MG PO TABS: ORAL_TABLET | ORAL | Status: DC

## 2013-07-24 ENCOUNTER — Other Ambulatory Visit: Payer: Self-pay | Admitting: *Deleted

## 2013-07-24 DIAGNOSIS — M545 Low back pain, unspecified: Secondary | ICD-10-CM

## 2013-07-24 MED ORDER — HYDROCODONE-ACETAMINOPHEN 10-325 MG PO TABS: ORAL_TABLET | ORAL | Status: DC

## 2013-08-19 ENCOUNTER — Other Ambulatory Visit: Payer: Self-pay | Admitting: Internal Medicine

## 2013-08-22 ENCOUNTER — Other Ambulatory Visit: Payer: Self-pay | Admitting: *Deleted

## 2013-08-22 DIAGNOSIS — M545 Low back pain, unspecified: Secondary | ICD-10-CM

## 2013-08-22 MED ORDER — HYDROCODONE-ACETAMINOPHEN 10-325 MG PO TABS: ORAL_TABLET | ORAL | Status: DC

## 2013-09-22 ENCOUNTER — Other Ambulatory Visit: Payer: Self-pay | Admitting: *Deleted

## 2013-09-22 DIAGNOSIS — M545 Low back pain, unspecified: Secondary | ICD-10-CM

## 2013-09-22 MED ORDER — HYDROCODONE-ACETAMINOPHEN 10-325 MG PO TABS: ORAL_TABLET | ORAL | Status: DC

## 2013-10-08 ENCOUNTER — Other Ambulatory Visit: Payer: Medicare Other

## 2013-10-08 DIAGNOSIS — N182 Chronic kidney disease, stage 2 (mild): Secondary | ICD-10-CM

## 2013-10-08 DIAGNOSIS — E785 Hyperlipidemia, unspecified: Secondary | ICD-10-CM

## 2013-10-08 DIAGNOSIS — E119 Type 2 diabetes mellitus without complications: Secondary | ICD-10-CM

## 2013-10-09 LAB — LIPID PANEL
Chol/HDL Ratio: 4.4 ratio units (ref 0.0–5.0)
Cholesterol, Total: 168 mg/dL (ref 100–199)
HDL: 38 mg/dL — AB (ref >39)
LDL Calculated: 93 mg/dL (ref 0–99)
Triglycerides: 185 mg/dL — ABNORMAL HIGH (ref 0–149)
VLDL CHOLESTEROL CAL: 37 mg/dL (ref 5–40)

## 2013-10-09 LAB — COMPREHENSIVE METABOLIC PANEL
A/G RATIO: 1.7 (ref 1.1–2.5)
ALT: 15 IU/L (ref 0–44)
AST: 10 IU/L (ref 0–40)
Albumin: 4 g/dL (ref 3.5–4.8)
Alkaline Phosphatase: 68 IU/L (ref 39–117)
BUN/Creatinine Ratio: 15 (ref 10–22)
BUN: 26 mg/dL (ref 8–27)
CALCIUM: 9.9 mg/dL (ref 8.6–10.2)
CO2: 29 mmol/L (ref 18–29)
CREATININE: 1.76 mg/dL — AB (ref 0.76–1.27)
Chloride: 96 mmol/L — ABNORMAL LOW (ref 97–108)
GFR calc Af Amer: 44 mL/min/{1.73_m2} — ABNORMAL LOW (ref >59)
GFR calc non Af Amer: 38 mL/min/{1.73_m2} — ABNORMAL LOW (ref >59)
GLOBULIN, TOTAL: 2.3 g/dL (ref 1.5–4.5)
GLUCOSE: 207 mg/dL — AB (ref 65–99)
POTASSIUM: 4 mmol/L (ref 3.5–5.2)
Sodium: 140 mmol/L (ref 134–144)
TOTAL PROTEIN: 6.3 g/dL (ref 6.0–8.5)
Total Bilirubin: 0.5 mg/dL (ref 0.0–1.2)

## 2013-10-09 LAB — HEMOGLOBIN A1C
ESTIMATED AVERAGE GLUCOSE: 200 mg/dL
Hgb A1c MFr Bld: 8.6 % — ABNORMAL HIGH (ref 4.8–5.6)

## 2013-10-10 ENCOUNTER — Encounter: Payer: Self-pay | Admitting: *Deleted

## 2013-10-10 ENCOUNTER — Telehealth: Payer: Self-pay | Admitting: *Deleted

## 2013-10-10 NOTE — Telephone Encounter (Signed)
Message copied by Eilene Ghazi on Fri Oct 10, 2013  9:53 AM ------      Message from: [place], [place] L      Created: Thu Oct 09, 2013  5:20 PM       Slight improvement in sugar average. ------

## 2013-10-10 NOTE — Telephone Encounter (Signed)
Spoke with patient regarding lab results, he states that he has made changes with his diet I encouraged to continue doing what he is doing.

## 2013-10-15 ENCOUNTER — Ambulatory Visit (INDEPENDENT_AMBULATORY_CARE_PROVIDER_SITE_OTHER): Payer: Medicare Other | Admitting: Internal Medicine

## 2013-10-15 ENCOUNTER — Encounter: Payer: Self-pay | Admitting: Internal Medicine

## 2013-10-15 VITALS — BP 164/80 | HR 82 | Temp 97.0°F | Wt 238.0 lb

## 2013-10-15 DIAGNOSIS — E119 Type 2 diabetes mellitus without complications: Secondary | ICD-10-CM

## 2013-10-15 DIAGNOSIS — E669 Obesity, unspecified: Secondary | ICD-10-CM

## 2013-10-15 DIAGNOSIS — M545 Low back pain, unspecified: Secondary | ICD-10-CM

## 2013-10-15 DIAGNOSIS — N182 Chronic kidney disease, stage 2 (mild): Secondary | ICD-10-CM

## 2013-10-15 DIAGNOSIS — E785 Hyperlipidemia, unspecified: Secondary | ICD-10-CM

## 2013-10-15 DIAGNOSIS — I4891 Unspecified atrial fibrillation: Secondary | ICD-10-CM | POA: Insufficient documentation

## 2013-10-15 DIAGNOSIS — I10 Essential (primary) hypertension: Secondary | ICD-10-CM

## 2013-10-15 MED ORDER — METFORMIN HCL 1000 MG PO TABS: ORAL_TABLET | ORAL | Status: DC

## 2013-10-15 MED ORDER — METOPROLOL TARTRATE 50 MG PO TABS: ORAL_TABLET | ORAL | Status: DC

## 2013-10-15 MED ORDER — HYDROCODONE-ACETAMINOPHEN 10-325 MG PO TABS: ORAL_TABLET | ORAL | Status: DC

## 2013-10-15 NOTE — Patient Instructions (Signed)
Add metoprolol as directed. Continue all other current medications.

## 2013-10-15 NOTE — Progress Notes (Signed)
Patient ID: Douglas Soto, male   DOB: 10/14/1940, 73 y.o.   MRN: [id number]    Location:    [facility]  Place of Service:  OFFICE    No Known Allergies  Chief Complaint  Patient presents with  . Medical Management of Chronic Issues    blood pressure, blood sugar, CKD, cholesterol   . Leg Swelling    off and on, more lately     HPI:  Has noted a pulse irregularity from time to time. Otherwise aymptomatic  Diabetes mellitus without complication - poor control. Poor dietary compliance  Chronic kidney disease, stage II (mild): stable  Essential hypertension, malignant - poor control  Hyperlipidemia: imporoved  Midline low back pain without sciatica - benefits from HYDROcodone-acetaminophen (NORCO) 10-325 MG per tablet  Obesity, unspecified: gaining  Atrial fibrillation, unspecified - new     Medications: Patient's Medications  New Prescriptions   No medications on file  Previous Medications   ALLOPURINOL (ZYLOPRIM) 300 MG TABLET    take 1 tablet by mouth once daily   DOXAZOSIN (CARDURA) 8 MG TABLET    take 1 tablet by mouth once daily for blood pressure   HYDROCODONE-ACETAMINOPHEN (NORCO) 10-325 MG PER TABLET    One every 6 hours as needed for pain control   METFORMIN (GLUCOPHAGE) 500 MG TABLET    Take one tablet by mouth once daily to control diabetes   MULTIVITAMIN-IRON-MINERALS-FOLIC ACID (CENTRUM) CHEWABLE TABLET    Chew 1 tablet by mouth daily.   TRIBENZOR 40-10-25 MG TABS    take 1 tablet by mouth once daily for blood pressure  Modified Medications   No medications on file  Discontinued Medications   No medications on file     Review of Systems  Constitutional: Negative.   HENT: Negative.   Eyes: Negative.   Respiratory: Negative.   Cardiovascular: Positive for palpitations and leg swelling. Negative for chest pain.  Gastrointestinal: Negative.   Endocrine: Negative for heat intolerance, polydipsia, polyphagia and polyuria.       Diabetic  Genitourinary:  Negative.   Musculoskeletal: Positive for back pain.       Hx gout  Skin: Negative.   Neurological: Negative.   Hematological: Negative.   Psychiatric/Behavioral: Negative.     Filed Vitals:   10/15/13 1136  BP: 206/96  Pulse: 84  Temp: 97 F (36.1 C)  TempSrc: Oral  Weight: 238 lb (107.956 kg)   There is no height on file to calculate BMI.  Physical Exam  Constitutional: He is oriented to person, place, and time. No distress.  overweight  HENT:  Head: Normocephalic and atraumatic.  Right Ear: External ear normal.  Left Ear: External ear normal.  Eyes: Conjunctivae and EOM are normal. Pupils are equal, round, and reactive to light.  Neck: Normal range of motion. Neck supple. No JVD present. No tracheal deviation present. No thyromegaly present.  Cardiovascular: Normal rate and intact distal pulses.  Exam reveals no gallop and no friction rub.   No murmur heard. AF  Pulmonary/Chest: Effort normal and breath sounds normal. No respiratory distress. He has no wheezes. He has no rales.  Abdominal: Soft. Bowel sounds are normal. He exhibits no distension and no mass. There is no tenderness.  Musculoskeletal: Normal range of motion. He exhibits no edema.  Tender in lower back and hips.  Lymphadenopathy:    He has no cervical adenopathy.  Neurological: He is alert and oriented to person, place, and time. He has normal reflexes. No cranial nerve  deficit. Coordination normal.  Skin: No rash noted. No erythema. No pallor.  Psychiatric: He has a normal mood and affect. His behavior is normal. Judgment and thought content normal.     Labs reviewed: Appointment on 10/08/2013  Component Date Value Ref Range Status  . Hemoglobin A1C 10/08/2013 8.6* 4.8 - 5.6 % Final   Comment:          Increased risk for diabetes: 5.7 - 6.4                                   Diabetes: >6.4                                   Glycemic control for adults with diabetes: <7.0  . Estimated average glucose  10/08/2013 200   Final  . Glucose 10/08/2013 207* 65 - 99 mg/dL Final  . BUN 10/08/2013 26  8 - 27 mg/dL Final  . Creatinine, Ser 10/08/2013 1.76* 0.76 - 1.27 mg/dL Final  . GFR calc non Af Amer 10/08/2013 38* >59 mL/min/1.73 Final  . GFR calc Af Amer 10/08/2013 44* >59 mL/min/1.73 Final  . BUN/Creatinine Ratio 10/08/2013 15  10 - 22 Final  . Sodium 10/08/2013 140  134 - 144 mmol/L Final  . Potassium 10/08/2013 4.0  3.5 - 5.2 mmol/L Final  . Chloride 10/08/2013 96* 97 - 108 mmol/L Final  . CO2 10/08/2013 29  18 - 29 mmol/L Final  . Calcium 10/08/2013 9.9  8.6 - 10.2 mg/dL Final  . Total Protein 10/08/2013 6.3  6.0 - 8.5 g/dL Final  . Albumin 10/08/2013 4.0  3.5 - 4.8 g/dL Final  . Globulin, Total 10/08/2013 2.3  1.5 - 4.5 g/dL Final  . Albumin/Globulin Ratio 10/08/2013 1.7  1.1 - 2.5 Final  . Total Bilirubin 10/08/2013 0.5  0.0 - 1.2 mg/dL Final  . Alkaline Phosphatase 10/08/2013 68  39 - 117 IU/L Final  . AST 10/08/2013 10  0 - 40 IU/L Final  . ALT 10/08/2013 15  0 - 44 IU/L Final  . Cholesterol, Total 10/08/2013 168  100 - 199 mg/dL Final  . Triglycerides 10/08/2013 185* 0 - 149 mg/dL Final  . HDL 10/08/2013 38* >39 mg/dL Final   Comment: According to ATP-III Guidelines, HDL-C >59 mg/dL is considered a                          negative risk factor for CHD.  Marland Kitchen VLDL Cholesterol Cal 10/08/2013 37  5 - 40 mg/dL Final  . LDL Calculated 10/08/2013 93  0 - 99 mg/dL Final  . Chol/HDL Ratio 10/08/2013 4.4  0.0 - 5.0 ratio units Final   Comment:                                   T. Chol/HDL Ratio                                                                      Men  Women  1/2 Avg.Risk  3.4    3.3                                                            Avg.Risk  5.0    4.4                                                         2X Avg.Risk  9.6    7.1                                                         3X Avg.Risk 23.4   11.0       Assessment/Plan  1. Diabetes mellitus without complication Increase metformin - metFORMIN (GLUCOPHAGE) 1000 MG tablet; One each morning to control diabetes  Dispense: 90 tablet; Refill: 3  2. Chronic kidney disease, stage II (mild) obsereve  3. Essential hypertension, malignant Continue current medications and add metoprolol (LOPRESSOR) 50 MG tablet; One twice daily to control BP and heart rhythm  Dispense: 180 tablet; Refill: 3  4. Hyperlipidemia controlled  5. Midline low back pain without sciatica - HYDROcodone-acetaminophen (NORCO) 10-325 MG per tablet; One every 6 hours as needed for pain control  Dispense: 120 tablet; Refill: 0  6. Obesity, unspecified Follow diet and lose weight  7. Atrial fibrillation, unspecified New. Added metoprolol (LOPRESSOR) 50 MG tablet; One twice daily to control BP and heart rhythm  Dispense: 180 tablet; Refill: 3 - EKG 12-Lead

## 2013-10-16 ENCOUNTER — Encounter: Payer: Self-pay | Admitting: Internal Medicine

## 2013-10-18 ENCOUNTER — Other Ambulatory Visit: Payer: Self-pay | Admitting: Internal Medicine

## 2013-10-20 ENCOUNTER — Other Ambulatory Visit: Payer: Self-pay | Admitting: *Deleted

## 2013-10-29 ENCOUNTER — Encounter: Payer: Self-pay | Admitting: Internal Medicine

## 2013-10-29 ENCOUNTER — Ambulatory Visit (INDEPENDENT_AMBULATORY_CARE_PROVIDER_SITE_OTHER): Payer: Medicare Other | Admitting: Internal Medicine

## 2013-10-29 VITALS — BP 134/82 | HR 63 | Temp 97.2°F | Resp 10 | Ht 66.08 in | Wt 234.0 lb

## 2013-10-29 DIAGNOSIS — I10 Essential (primary) hypertension: Secondary | ICD-10-CM

## 2013-10-29 DIAGNOSIS — E119 Type 2 diabetes mellitus without complications: Secondary | ICD-10-CM

## 2013-10-29 DIAGNOSIS — E785 Hyperlipidemia, unspecified: Secondary | ICD-10-CM

## 2013-10-29 DIAGNOSIS — N182 Chronic kidney disease, stage 2 (mild): Secondary | ICD-10-CM

## 2013-10-29 NOTE — Progress Notes (Signed)
Patient ID: Douglas Soto, male   DOB: 1940/12/18, 73 y.o.   MRN: [id number]    Location:    [facility]  Place of Service:  OFFICE    No Known Allergies  Chief Complaint  Patient presents with  . Follow-up    2 week follow-up   . Form Completion    [employer] Form, Patient refuses to continue with MMSE     HPI:  Essential hypertension, malignant - improved. Tolerating medications OK.  Diabetes mellitus without complication - has improved diet   Medications: Patient's Medications  New Prescriptions   No medications on file  Previous Medications   ALLOPURINOL (ZYLOPRIM) 300 MG TABLET    take 1 tablet by mouth once daily   DOXAZOSIN (CARDURA) 8 MG TABLET    take 1 tablet by mouth once daily for blood pressure   HYDROCODONE-ACETAMINOPHEN (NORCO) 10-325 MG PER TABLET    One every 6 hours as needed for pain control   METFORMIN (GLUCOPHAGE) 1000 MG TABLET    One each morning to control diabetes   METOPROLOL (LOPRESSOR) 50 MG TABLET    One twice daily to control BP and heart rhythm   MULTIVITAMIN-IRON-MINERALS-FOLIC ACID (CENTRUM) CHEWABLE TABLET    Chew 1 tablet by mouth daily.   TRIBENZOR 40-10-25 MG TABS    take 1 tablet by mouth once daily for blood pressure  Modified Medications   No medications on file  Discontinued Medications   No medications on file     Review of Systems  Constitutional: Negative.   HENT: Negative.   Eyes: Negative.   Respiratory: Negative.   Cardiovascular: Positive for palpitations and leg swelling. Negative for chest pain.  Gastrointestinal: Negative.   Endocrine: Negative for heat intolerance, polydipsia, polyphagia and polyuria.       Diabetic  Genitourinary: Negative.   Musculoskeletal: Positive for back pain.       Hx gout  Skin: Negative.   Neurological: Negative.   Hematological: Negative.   Psychiatric/Behavioral: Negative.     Filed Vitals:   10/29/13 1615  BP: 134/82  Pulse: 63  Temp: 97.2 F (36.2 C)  TempSrc: Oral  Resp: 10    Height: 5' 6.08" (1.678 m)  Weight: 234 lb (106.142 kg)  SpO2: 96%   Body mass index is 37.7 kg/(m^2).  Physical Exam  Constitutional: He is oriented to person, place, and time. No distress.  overweight  HENT:  Head: Normocephalic and atraumatic.  Right Ear: External ear normal.  Left Ear: External ear normal.  Eyes: Conjunctivae and EOM are normal. Pupils are equal, round, and reactive to light.  Neck: Normal range of motion. Neck supple. No JVD present. No tracheal deviation present. No thyromegaly present.  Cardiovascular: Normal rate and intact distal pulses.  Exam reveals no gallop and no friction rub.   No murmur heard. AF  Pulmonary/Chest: Effort normal and breath sounds normal. No respiratory distress. He has no wheezes. He has no rales.  Abdominal: Soft. Bowel sounds are normal. He exhibits no distension and no mass. There is no tenderness.  Musculoskeletal: Normal range of motion. He exhibits no edema.  Tender in lower back and hips.  Lymphadenopathy:    He has no cervical adenopathy.  Neurological: He is alert and oriented to person, place, and time. He has normal reflexes. No cranial nerve deficit. Coordination normal.  Skin: No rash noted. No erythema. No pallor.  Psychiatric: He has a normal mood and affect. His behavior is normal. Judgment and thought content normal.  Labs reviewed: Appointment on 10/08/2013  Component Date Value Ref Range Status  . Hemoglobin A1C 10/08/2013 8.6* 4.8 - 5.6 % Final   Comment:          Increased risk for diabetes: 5.7 - 6.4                                   Diabetes: >6.4                                   Glycemic control for adults with diabetes: <7.0  . Estimated average glucose 10/08/2013 200   Final  . Glucose 10/08/2013 207* 65 - 99 mg/dL Final  . BUN 10/08/2013 26  8 - 27 mg/dL Final  . Creatinine, Ser 10/08/2013 1.76* 0.76 - 1.27 mg/dL Final  . GFR calc non Af Amer 10/08/2013 38* >59 mL/min/1.73 Final  . GFR calc Af  Amer 10/08/2013 44* >59 mL/min/1.73 Final  . BUN/Creatinine Ratio 10/08/2013 15  10 - 22 Final  . Sodium 10/08/2013 140  134 - 144 mmol/L Final  . Potassium 10/08/2013 4.0  3.5 - 5.2 mmol/L Final  . Chloride 10/08/2013 96* 97 - 108 mmol/L Final  . CO2 10/08/2013 29  18 - 29 mmol/L Final  . Calcium 10/08/2013 9.9  8.6 - 10.2 mg/dL Final  . Total Protein 10/08/2013 6.3  6.0 - 8.5 g/dL Final  . Albumin 10/08/2013 4.0  3.5 - 4.8 g/dL Final  . Globulin, Total 10/08/2013 2.3  1.5 - 4.5 g/dL Final  . Albumin/Globulin Ratio 10/08/2013 1.7  1.1 - 2.5 Final  . Total Bilirubin 10/08/2013 0.5  0.0 - 1.2 mg/dL Final  . Alkaline Phosphatase 10/08/2013 68  39 - 117 IU/L Final  . AST 10/08/2013 10  0 - 40 IU/L Final  . ALT 10/08/2013 15  0 - 44 IU/L Final  . Cholesterol, Total 10/08/2013 168  100 - 199 mg/dL Final  . Triglycerides 10/08/2013 185* 0 - 149 mg/dL Final  . HDL 10/08/2013 38* >39 mg/dL Final   Comment: According to ATP-III Guidelines, HDL-C >59 mg/dL is considered a                          negative risk factor for CHD.  Marland Kitchen VLDL Cholesterol Cal 10/08/2013 37  5 - 40 mg/dL Final  . LDL Calculated 10/08/2013 93  0 - 99 mg/dL Final  . Chol/HDL Ratio 10/08/2013 4.4  0.0 - 5.0 ratio units Final   Comment:                                   T. Chol/HDL Ratio                                                                      Men  Women  1/2 Avg.Risk  3.4    3.3                                                            Avg.Risk  5.0    4.4                                                         2X Avg.Risk  9.6    7.1                                                         3X Avg.Risk 23.4   11.0      Assessment/Plan  1. Essential hypertension, malignant Continue current meds - Comprehensive metabolic panel; Future  2. Diabetes mellitus without complication Follow diet and take metformin - Hemoglobin A1c; Future - Comprehensive  metabolic panel; Future - Microalbumin, urine; Future  3. Hyperlipidemia - Lipid panel; Future  4. Chronic kidney disease, stage II (mild) -CMP, future

## 2013-10-29 NOTE — Patient Instructions (Signed)
Continue current medications. 

## 2013-11-01 ENCOUNTER — Encounter: Payer: Self-pay | Admitting: Internal Medicine

## 2013-11-17 ENCOUNTER — Other Ambulatory Visit: Payer: Self-pay | Admitting: *Deleted

## 2013-11-17 DIAGNOSIS — M545 Low back pain, unspecified: Secondary | ICD-10-CM

## 2013-11-17 MED ORDER — HYDROCODONE-ACETAMINOPHEN 10-325 MG PO TABS: ORAL_TABLET | ORAL | Status: DC

## 2013-11-17 NOTE — Telephone Encounter (Signed)
Patient Requested 

## 2013-12-23 ENCOUNTER — Other Ambulatory Visit: Payer: Self-pay | Admitting: *Deleted

## 2013-12-23 DIAGNOSIS — M545 Low back pain, unspecified: Secondary | ICD-10-CM

## 2013-12-23 MED ORDER — HYDROCODONE-ACETAMINOPHEN 10-325 MG PO TABS: ORAL_TABLET | ORAL | Status: DC

## 2013-12-23 NOTE — Telephone Encounter (Signed)
Patient Requested 

## 2014-01-19 ENCOUNTER — Other Ambulatory Visit: Payer: Self-pay | Admitting: Internal Medicine

## 2014-01-20 ENCOUNTER — Other Ambulatory Visit: Payer: Self-pay | Admitting: *Deleted

## 2014-01-20 DIAGNOSIS — M545 Low back pain, unspecified: Secondary | ICD-10-CM

## 2014-01-20 MED ORDER — HYDROCODONE-ACETAMINOPHEN 10-325 MG PO TABS: ORAL_TABLET | ORAL | Status: DC

## 2014-01-20 NOTE — Telephone Encounter (Signed)
Patient Requested and will pick up 

## 2014-02-03 ENCOUNTER — Encounter: Payer: Self-pay | Admitting: Internal Medicine

## 2014-02-03 ENCOUNTER — Ambulatory Visit (INDEPENDENT_AMBULATORY_CARE_PROVIDER_SITE_OTHER): Payer: Medicare Other | Admitting: Internal Medicine

## 2014-02-03 VITALS — BP 132/80 | HR 82 | Temp 97.9°F | Wt 240.0 lb

## 2014-02-03 DIAGNOSIS — M545 Low back pain, unspecified: Secondary | ICD-10-CM

## 2014-02-03 DIAGNOSIS — M25552 Pain in left hip: Secondary | ICD-10-CM

## 2014-02-03 DIAGNOSIS — N182 Chronic kidney disease, stage 2 (mild): Secondary | ICD-10-CM

## 2014-02-03 DIAGNOSIS — M25551 Pain in right hip: Secondary | ICD-10-CM

## 2014-02-03 DIAGNOSIS — E669 Obesity, unspecified: Secondary | ICD-10-CM

## 2014-02-03 DIAGNOSIS — E119 Type 2 diabetes mellitus without complications: Secondary | ICD-10-CM

## 2014-02-03 DIAGNOSIS — I10 Essential (primary) hypertension: Secondary | ICD-10-CM

## 2014-02-03 MED ORDER — HYDROCODONE-ACETAMINOPHEN 10-325 MG PO TABS: ORAL_TABLET | ORAL | Status: DC

## 2014-02-03 NOTE — Progress Notes (Signed)
Patient ID: Douglas Soto, male   DOB: June 05, 1940, 73 y.o.   MRN: [id number]    Facility  [facility]    Place of Service:   OFFICE   No Known Allergies  Chief Complaint  Patient presents with  . Medical Management of Chronic Issues    3 month follow-up, ongoing lower back pain, and ongoing fatigue   . Immunizations    Refused flu vaccine     HPI:  Midline low back pain without sciatica -stable. Benefits from: HYDROcodone-acetaminophen ([place]) 10-325 MG per tablet  Diabetes mellitus without complication: stable  Chronic kidney disease, stage II (mild): F/U lab next visit  Essential hypertension, malignant: im proved  Hip pain, bilateral: no longer able to do as much. Has hired someone to cut his grass  Obesity: gained 6 # in the last 3 months. Retaining a little fluid.    Medications: Patient's Medications  New Prescriptions   No medications on file  Previous Medications   ALLOPURINOL (ZYLOPRIM) 300 MG TABLET    take 1 tablet by mouth once daily   DOXAZOSIN (CARDURA) 8 MG TABLET    take 1 tablet by mouth once daily for blood pressure   HYDROCODONE-ACETAMINOPHEN (NORCO) 10-325 MG PER TABLET    Take One tablet by mouth every 6 hours as needed for pain control   METFORMIN (GLUCOPHAGE) 1000 MG TABLET    One each morning to control diabetes   METOPROLOL (LOPRESSOR) 50 MG TABLET    One twice daily to control BP and heart rhythm   MULTIVITAMIN-IRON-MINERALS-FOLIC ACID (CENTRUM) CHEWABLE TABLET    Chew 1 tablet by mouth daily.   TRIBENZOR 40-10-25 MG TABS    take 1 tablet by mouth once daily for blood pressure  Modified Medications   No medications on file  Discontinued Medications   No medications on file     Review of Systems  Constitutional: Negative.   HENT: Positive for tinnitus.   Eyes: Negative.   Respiratory: Negative.   Cardiovascular: Positive for palpitations and leg swelling. Negative for chest pain.  Gastrointestinal: Negative.   Endocrine: Negative for heat  intolerance, polydipsia, polyphagia and polyuria.       Diabetic  Genitourinary: Negative.   Musculoskeletal: Positive for back pain.       Hx gout  Skin: Negative.   Neurological: Negative.   Hematological: Negative.   Psychiatric/Behavioral: Negative.     Filed Vitals:   02/03/14 1405  BP: 132/80  Pulse: 82  Temp: 97.9 F (36.6 C)  TempSrc: Oral  Weight: 240 lb (108.863 kg)  SpO2: 95%   Body mass index is 38.66 kg/(m^2).  Physical Exam  Constitutional: He is oriented to person, place, and time. No distress.  overweight  HENT:  Head: Normocephalic and atraumatic.  Right Ear: External ear normal.  Left Ear: External ear normal.  Eyes: Conjunctivae and EOM are normal. Pupils are equal, round, and reactive to light.  Neck: Normal range of motion. Neck supple. No JVD present. No tracheal deviation present. No thyromegaly present.  Cardiovascular: Normal rate and intact distal pulses.  Exam reveals no gallop and no friction rub.   No murmur heard. AF  Pulmonary/Chest: Effort normal and breath sounds normal. No respiratory distress. He has no wheezes. He has no rales.  Abdominal: Soft. Bowel sounds are normal. He exhibits no distension and no mass. There is no tenderness.  Musculoskeletal: Normal range of motion. He exhibits edema (1+ bipedal).  Tender in lower back and hips.  Lymphadenopathy:  He has no cervical adenopathy.  Neurological: He is alert and oriented to person, place, and time. He has normal reflexes. No cranial nerve deficit. Coordination normal.  Skin: No rash noted. No erythema. No pallor.  Psychiatric: He has a normal mood and affect. His behavior is normal. Judgment and thought content normal.     Labs reviewed: No visits with results within 3 Month(s) from this visit. Latest known visit with results is:  Appointment on 10/08/2013  Component Date Value Ref Range Status  . Hemoglobin A1C 10/08/2013 8.6* 4.8 - 5.6 % Final   Comment:           Increased risk for diabetes: 5.7 - 6.4                                   Diabetes: >6.4                                   Glycemic control for adults with diabetes: <7.0  . Estimated average glucose 10/08/2013 200   Final  . Glucose 10/08/2013 207* 65 - 99 mg/dL Final  . BUN 10/08/2013 26  8 - 27 mg/dL Final  . Creatinine, Ser 10/08/2013 1.76* 0.76 - 1.27 mg/dL Final  . GFR calc non Af Amer 10/08/2013 38* >59 mL/min/1.73 Final  . GFR calc Af Amer 10/08/2013 44* >59 mL/min/1.73 Final  . BUN/Creatinine Ratio 10/08/2013 15  10 - 22 Final  . Sodium 10/08/2013 140  134 - 144 mmol/L Final  . Potassium 10/08/2013 4.0  3.5 - 5.2 mmol/L Final  . Chloride 10/08/2013 96* 97 - 108 mmol/L Final  . CO2 10/08/2013 29  18 - 29 mmol/L Final  . Calcium 10/08/2013 9.9  8.6 - 10.2 mg/dL Final  . Total Protein 10/08/2013 6.3  6.0 - 8.5 g/dL Final  . Albumin 10/08/2013 4.0  3.5 - 4.8 g/dL Final  . Globulin, Total 10/08/2013 2.3  1.5 - 4.5 g/dL Final  . Albumin/Globulin Ratio 10/08/2013 1.7  1.1 - 2.5 Final  . Total Bilirubin 10/08/2013 0.5  0.0 - 1.2 mg/dL Final  . Alkaline Phosphatase 10/08/2013 68  39 - 117 IU/L Final  . AST 10/08/2013 10  0 - 40 IU/L Final  . ALT 10/08/2013 15  0 - 44 IU/L Final  . Cholesterol, Total 10/08/2013 168  100 - 199 mg/dL Final  . Triglycerides 10/08/2013 185* 0 - 149 mg/dL Final  . HDL 10/08/2013 38* >39 mg/dL Final   Comment: According to ATP-III Guidelines, HDL-C >59 mg/dL is considered a                          negative risk factor for CHD.  Marland Kitchen VLDL Cholesterol Cal 10/08/2013 37  5 - 40 mg/dL Final  . LDL Calculated 10/08/2013 93  0 - 99 mg/dL Final  . Chol/HDL Ratio 10/08/2013 4.4  0.0 - 5.0 ratio units Final   Comment:                                   T. Chol/HDL Ratio  Men  Women                                                        1/2 Avg.Risk  3.4    3.3                                                             Avg.Risk  5.0    4.4                                                         2X Avg.Risk  9.6    7.1                                                         3X Avg.Risk 23.4   11.0     Assessment/Plan  1. Midline low back pain without sciatica - HYDROcodone-acetaminophen (NORCO) 10-325 MG per tablet; Take One tablet by mouth every 6 hours as needed for pain control  Dispense: 120 tablet; Refill: 0  2. Diabetes mellitus without complication Continue current meds  3. Chronic kidney disease, stage II (mild) -CMP, future  4. Essential hypertension, malignant Continue current meds  5. Hip pain, bilateral Continue HC/APAP  6. Obesity Follow appropriate diet

## 2014-02-03 NOTE — Patient Instructions (Signed)
Return for lab prior to next appointment.

## 2014-03-24 ENCOUNTER — Other Ambulatory Visit: Payer: Self-pay | Admitting: *Deleted

## 2014-03-24 DIAGNOSIS — M545 Low back pain, unspecified: Secondary | ICD-10-CM

## 2014-03-24 MED ORDER — HYDROCODONE-ACETAMINOPHEN 10-325 MG PO TABS: ORAL_TABLET | ORAL | Status: DC

## 2014-03-24 NOTE — Telephone Encounter (Signed)
Patient Requested and will pick up 

## 2014-04-08 ENCOUNTER — Encounter: Payer: Self-pay | Admitting: *Deleted

## 2014-04-21 ENCOUNTER — Other Ambulatory Visit: Payer: Self-pay | Admitting: *Deleted

## 2014-04-21 DIAGNOSIS — M545 Low back pain, unspecified: Secondary | ICD-10-CM

## 2014-04-21 MED ORDER — HYDROCODONE-ACETAMINOPHEN 10-325 MG PO TABS: ORAL_TABLET | ORAL | Status: DC

## 2014-04-21 NOTE — Telephone Encounter (Signed)
Patient requested and will pick up 

## 2014-05-18 ENCOUNTER — Other Ambulatory Visit: Payer: Medicare Other

## 2014-05-18 DIAGNOSIS — E785 Hyperlipidemia, unspecified: Secondary | ICD-10-CM

## 2014-05-18 DIAGNOSIS — I10 Essential (primary) hypertension: Secondary | ICD-10-CM

## 2014-05-18 DIAGNOSIS — E119 Type 2 diabetes mellitus without complications: Secondary | ICD-10-CM

## 2014-05-19 LAB — COMPREHENSIVE METABOLIC PANEL
ALBUMIN: 4 g/dL (ref 3.5–4.8)
ALT: 14 IU/L (ref 0–44)
AST: 11 IU/L (ref 0–40)
Albumin/Globulin Ratio: 1.7 (ref 1.1–2.5)
Alkaline Phosphatase: 70 IU/L (ref 39–117)
BUN/Creatinine Ratio: 15 (ref 10–22)
BUN: 22 mg/dL (ref 8–27)
CALCIUM: 9.5 mg/dL (ref 8.6–10.2)
CO2: 26 mmol/L (ref 18–29)
Chloride: 97 mmol/L (ref 97–108)
Creatinine, Ser: 1.47 mg/dL — ABNORMAL HIGH (ref 0.76–1.27)
GFR calc non Af Amer: 47 mL/min/{1.73_m2} — ABNORMAL LOW (ref >59)
GFR, EST AFRICAN AMERICAN: 54 mL/min/{1.73_m2} — AB (ref >59)
Globulin, Total: 2.3 g/dL (ref 1.5–4.5)
Glucose: 207 mg/dL — ABNORMAL HIGH (ref 65–99)
Potassium: 4.1 mmol/L (ref 3.5–5.2)
SODIUM: 138 mmol/L (ref 134–144)
TOTAL PROTEIN: 6.3 g/dL (ref 6.0–8.5)
Total Bilirubin: 0.6 mg/dL (ref 0.0–1.2)

## 2014-05-19 LAB — LIPID PANEL
CHOL/HDL RATIO: 4 ratio (ref 0.0–5.0)
Cholesterol, Total: 191 mg/dL (ref 100–199)
HDL: 48 mg/dL (ref >39)
LDL Calculated: 118 mg/dL — ABNORMAL HIGH (ref 0–99)
TRIGLYCERIDES: 126 mg/dL (ref 0–149)
VLDL Cholesterol Cal: 25 mg/dL (ref 5–40)

## 2014-05-19 LAB — HEMOGLOBIN A1C
Est. average glucose Bld gHb Est-mCnc: 275 mg/dL
Hgb A1c MFr Bld: 11.2 % — ABNORMAL HIGH (ref 4.8–5.6)

## 2014-05-20 ENCOUNTER — Other Ambulatory Visit: Payer: Medicare Other

## 2014-05-20 ENCOUNTER — Ambulatory Visit (INDEPENDENT_AMBULATORY_CARE_PROVIDER_SITE_OTHER): Payer: Medicare Other | Admitting: Internal Medicine

## 2014-05-20 ENCOUNTER — Encounter: Payer: Self-pay | Admitting: Internal Medicine

## 2014-05-20 VITALS — BP 120/58 | HR 84 | Temp 97.6°F | Resp 20 | Ht 66.0 in | Wt 240.0 lb

## 2014-05-20 DIAGNOSIS — M545 Low back pain, unspecified: Secondary | ICD-10-CM

## 2014-05-20 DIAGNOSIS — E669 Obesity, unspecified: Secondary | ICD-10-CM

## 2014-05-20 DIAGNOSIS — N182 Chronic kidney disease, stage 2 (mild): Secondary | ICD-10-CM

## 2014-05-20 DIAGNOSIS — E1165 Type 2 diabetes mellitus with hyperglycemia: Secondary | ICD-10-CM

## 2014-05-20 DIAGNOSIS — I10 Essential (primary) hypertension: Secondary | ICD-10-CM | POA: Diagnosis not present

## 2014-05-20 DIAGNOSIS — E785 Hyperlipidemia, unspecified: Secondary | ICD-10-CM

## 2014-05-20 DIAGNOSIS — E1122 Type 2 diabetes mellitus with diabetic chronic kidney disease: Secondary | ICD-10-CM | POA: Insufficient documentation

## 2014-05-20 DIAGNOSIS — E1129 Type 2 diabetes mellitus with other diabetic kidney complication: Secondary | ICD-10-CM | POA: Diagnosis not present

## 2014-05-20 DIAGNOSIS — M25551 Pain in right hip: Secondary | ICD-10-CM

## 2014-05-20 DIAGNOSIS — IMO0002 Reserved for concepts with insufficient information to code with codable children: Secondary | ICD-10-CM

## 2014-05-20 DIAGNOSIS — E119 Type 2 diabetes mellitus without complications: Secondary | ICD-10-CM | POA: Diagnosis not present

## 2014-05-20 DIAGNOSIS — M25552 Pain in left hip: Secondary | ICD-10-CM

## 2014-05-20 MED ORDER — HYDROCODONE-ACETAMINOPHEN 10-325 MG PO TABS: ORAL_TABLET | ORAL | Status: DC

## 2014-05-20 NOTE — Progress Notes (Signed)
Patient ID: Douglas Soto, male   DOB: 22-Jun-1940, 74 y.o.   MRN: [id number]    Facility  [facility]    Place of Service:   OFFICE   No Known Allergies  Chief Complaint  Patient presents with  . Medical Management of Chronic Issues    HPI:  DM type 2, uncontrolled, with renal complications -poor control with rise in A1c to 11.2. He admits to dietary noncompliance. "Eating a lot of junk food". This includes starches and sweets.   Obesity: Dietary noncompliance. No weight loss.  Hyperlipidemia: Stable  Midline low back pain without sciatica - unchanged. Does not wish to have referral to specialist at this time. Prefers to continue on his pain medications.  Essential hypertension, malignant - under control now.  Chronic kidney disease, stage II (mild) -unchanged  Hip pain, bilateral -he is aware that weight loss could help this problem    Medications: Patient's Medications  New Prescriptions   No medications on file  Previous Medications   ALLOPURINOL (ZYLOPRIM) 300 MG TABLET    take 1 tablet by mouth once daily   DOXAZOSIN (CARDURA) 8 MG TABLET    take 1 tablet by mouth once daily for blood pressure   HYDROCODONE-ACETAMINOPHEN (NORCO) 10-325 MG PER TABLET    Take One tablet by mouth every 6 hours as needed for pain control   METFORMIN (GLUCOPHAGE) 1000 MG TABLET    One each morning to control diabetes   METOPROLOL (LOPRESSOR) 50 MG TABLET    One twice daily to control BP and heart rhythm   MULTIVITAMIN-IRON-MINERALS-FOLIC ACID (CENTRUM) CHEWABLE TABLET    Chew 1 tablet by mouth daily.   TRIBENZOR 40-10-25 MG TABS    take 1 tablet by mouth once daily for blood pressure  Modified Medications   No medications on file  Discontinued Medications   No medications on file     Review of Systems  Constitutional: Negative.   HENT: Positive for tinnitus.   Eyes: Negative.   Respiratory: Negative.   Cardiovascular: Positive for palpitations and leg swelling. Negative for chest pain.   Gastrointestinal: Negative.   Endocrine: Negative for heat intolerance, polydipsia, polyphagia and polyuria.       Diabetic  Genitourinary: Negative.   Musculoskeletal: Positive for back pain.       Hx gout  Skin: Negative.   Neurological: Negative.   Hematological: Negative.   Psychiatric/Behavioral: Negative.     Filed Vitals:   05/20/14 1332  BP: 120/58  Pulse: 84  Temp: 97.6 F (36.4 C)  TempSrc: Oral  Resp: 20  Height: 5\' 6"  (1.676 m)  Weight: 240 lb (108.863 kg)  SpO2: 95%   Body mass index is 38.76 kg/(m^2).  Physical Exam  Constitutional: He is oriented to person, place, and time. No distress.  overweight  HENT:  Head: Normocephalic and atraumatic.  Right Ear: External ear normal.  Left Ear: External ear normal.  Eyes: Conjunctivae and EOM are normal. Pupils are equal, round, and reactive to light.  Neck: Normal range of motion. Neck supple. No JVD present. No tracheal deviation present. No thyromegaly present.  Cardiovascular: Normal rate and intact distal pulses.  Exam reveals no gallop and no friction rub.   No murmur heard. AF  Pulmonary/Chest: Effort normal and breath sounds normal. No respiratory distress. He has no wheezes. He has no rales.  Abdominal: Soft. Bowel sounds are normal. He exhibits no distension and no mass. There is no tenderness.  Musculoskeletal: Normal range of motion. He  exhibits edema (1+ bipedal).  Tender in lower back and hips.  Lymphadenopathy:    He has no cervical adenopathy.  Neurological: He is alert and oriented to person, place, and time. He has normal reflexes. No cranial nerve deficit. Coordination normal.  Skin: No rash noted. No erythema. No pallor.  Psychiatric: He has a normal mood and affect. His behavior is normal. Judgment and thought content normal.     Labs reviewed: Appointment on 05/18/2014  Component Date Value Ref Range Status  . Hgb A1c MFr Bld 05/18/2014 11.2* 4.8 - 5.6 % Final   Comment:           Pre-diabetes: 5.7 - 6.4          Diabetes: >6.4          Glycemic control for adults with diabetes: <7.0   . Est. average glucose Bld gHb Est-m* 05/18/2014 275   Final  . Glucose 05/18/2014 207* 65 - 99 mg/dL Final  . BUN 05/18/2014 22  8 - 27 mg/dL Final  . Creatinine, Ser 05/18/2014 1.47* 0.76 - 1.27 mg/dL Final  . GFR calc non Af Amer 05/18/2014 47* >59 mL/min/1.73 Final  . GFR calc Af Amer 05/18/2014 54* >59 mL/min/1.73 Final  . BUN/Creatinine Ratio 05/18/2014 15  10 - 22 Final  . Sodium 05/18/2014 138  134 - 144 mmol/L Final  . Potassium 05/18/2014 4.1  3.5 - 5.2 mmol/L Final  . Chloride 05/18/2014 97  97 - 108 mmol/L Final  . CO2 05/18/2014 26  18 - 29 mmol/L Final  . Calcium 05/18/2014 9.5  8.6 - 10.2 mg/dL Final  . Total Protein 05/18/2014 6.3  6.0 - 8.5 g/dL Final  . Albumin 05/18/2014 4.0  3.5 - 4.8 g/dL Final  . Globulin, Total 05/18/2014 2.3  1.5 - 4.5 g/dL Final  . Albumin/Globulin Ratio 05/18/2014 1.7  1.1 - 2.5 Final  . Total Bilirubin 05/18/2014 0.6  0.0 - 1.2 mg/dL Final  . Alkaline Phosphatase 05/18/2014 70  39 - 117 IU/L Final  . AST 05/18/2014 11  0 - 40 IU/L Final  . ALT 05/18/2014 14  0 - 44 IU/L Final  . Cholesterol, Total 05/18/2014 191  100 - 199 mg/dL Final  . Triglycerides 05/18/2014 126  0 - 149 mg/dL Final  . HDL 05/18/2014 48  >39 mg/dL Final   Comment: According to ATP-III Guidelines, HDL-C >59 mg/dL is considered a negative risk factor for CHD.   Marland Kitchen VLDL Cholesterol Cal 05/18/2014 25  5 - 40 mg/dL Final  . LDL Calculated 05/18/2014 118* 0 - 99 mg/dL Final  . Chol/HDL Ratio 05/18/2014 4.0  0.0 - 5.0 ratio units Final   Comment:                                   T. Chol/HDL Ratio                                             Men  Women                               1/2 Avg.Risk  3.4    3.3  Avg.Risk  5.0    4.4                                2X Avg.Risk  9.6    7.1                                3X Avg.Risk 23.4    11.0      Assessment/Plan  1. DM type 2, uncontrolled, with renal complications Discussed dietary compliance. Did not change medications. Patient has been able to successfully monitor diet and lose weight in the past. - Hemoglobin A1c; Future - Basic metabolic panel; Future - Microalbumin, urine; Future  2. Obesity Discussed dietary compliance necessity for weight loss. Improvements in average blood sugar, hip pain, and back pain were mentioned to the patient.  3. Hyperlipidemia Controlled  4. Midline low back pain without sciatica Lose weight - HYDROcodone-acetaminophen ([place]) 10-325 MG per tablet; Take One tablet by mouth every 6 hours as needed for pain control  Dispense: 120 tablet; Refill: 0  5. Essential hypertension, malignant Controlled - Basic metabolic panel; Future  6. Chronic kidney disease, stage II (mild) Unchanged  7. Hip pain, bilateral Lose weight

## 2014-05-21 LAB — MICROALBUMIN, URINE: Microalbumin, Urine: 38.9 ug/mL — ABNORMAL HIGH (ref 0.0–17.0)

## 2014-06-19 ENCOUNTER — Other Ambulatory Visit: Payer: Self-pay | Admitting: *Deleted

## 2014-06-19 DIAGNOSIS — M545 Low back pain, unspecified: Secondary | ICD-10-CM

## 2014-06-19 MED ORDER — HYDROCODONE-ACETAMINOPHEN 10-325 MG PO TABS: ORAL_TABLET | ORAL | Status: DC

## 2014-06-19 NOTE — Telephone Encounter (Signed)
Patient Requested and will pick up 

## 2014-06-25 ENCOUNTER — Other Ambulatory Visit: Payer: Self-pay | Admitting: Internal Medicine

## 2014-07-21 ENCOUNTER — Other Ambulatory Visit: Payer: Self-pay | Admitting: *Deleted

## 2014-07-21 DIAGNOSIS — M545 Low back pain, unspecified: Secondary | ICD-10-CM

## 2014-07-21 MED ORDER — HYDROCODONE-ACETAMINOPHEN 10-325 MG PO TABS: ORAL_TABLET | ORAL | Status: DC

## 2014-07-21 NOTE — Telephone Encounter (Signed)
Patient Requested and will pick up 

## 2014-08-18 ENCOUNTER — Other Ambulatory Visit: Payer: Medicare Other

## 2014-08-18 ENCOUNTER — Other Ambulatory Visit: Payer: Self-pay | Admitting: *Deleted

## 2014-08-18 DIAGNOSIS — E1129 Type 2 diabetes mellitus with other diabetic kidney complication: Secondary | ICD-10-CM

## 2014-08-18 DIAGNOSIS — M545 Low back pain, unspecified: Secondary | ICD-10-CM

## 2014-08-18 DIAGNOSIS — E1165 Type 2 diabetes mellitus with hyperglycemia: Secondary | ICD-10-CM | POA: Diagnosis not present

## 2014-08-18 DIAGNOSIS — IMO0002 Reserved for concepts with insufficient information to code with codable children: Secondary | ICD-10-CM

## 2014-08-18 DIAGNOSIS — I10 Essential (primary) hypertension: Secondary | ICD-10-CM

## 2014-08-18 MED ORDER — HYDROCODONE-ACETAMINOPHEN 10-325 MG PO TABS
ORAL_TABLET | ORAL | Status: DC
Start: 2014-08-18 — End: 2014-09-18

## 2014-08-18 NOTE — Telephone Encounter (Signed)
Patient requested while here for bloodwork.

## 2014-08-19 LAB — BASIC METABOLIC PANEL
BUN/Creatinine Ratio: 11 (ref 10–22)
BUN: 16 mg/dL (ref 8–27)
CO2: 25 mmol/L (ref 18–29)
Calcium: 10 mg/dL (ref 8.6–10.2)
Chloride: 98 mmol/L (ref 97–108)
Creatinine, Ser: 1.47 mg/dL — ABNORMAL HIGH (ref 0.76–1.27)
GFR calc non Af Amer: 47 mL/min/{1.73_m2} — ABNORMAL LOW (ref >59)
GFR, EST AFRICAN AMERICAN: 54 mL/min/{1.73_m2} — AB (ref >59)
GLUCOSE: 190 mg/dL — AB (ref 65–99)
POTASSIUM: 4 mmol/L (ref 3.5–5.2)
Sodium: 141 mmol/L (ref 134–144)

## 2014-08-19 LAB — HEMOGLOBIN A1C
Est. average glucose Bld gHb Est-mCnc: 200 mg/dL
Hgb A1c MFr Bld: 8.6 % — ABNORMAL HIGH (ref 4.8–5.6)

## 2014-08-20 ENCOUNTER — Other Ambulatory Visit: Payer: Medicare Other

## 2014-08-20 DIAGNOSIS — E1165 Type 2 diabetes mellitus with hyperglycemia: Secondary | ICD-10-CM | POA: Diagnosis not present

## 2014-08-20 DIAGNOSIS — E1129 Type 2 diabetes mellitus with other diabetic kidney complication: Secondary | ICD-10-CM | POA: Diagnosis not present

## 2014-08-20 DIAGNOSIS — IMO0002 Reserved for concepts with insufficient information to code with codable children: Secondary | ICD-10-CM

## 2014-08-21 LAB — MICROALBUMIN, URINE: Microalbumin, Urine: 19 ug/mL — ABNORMAL HIGH (ref 0.0–17.0)

## 2014-08-26 ENCOUNTER — Ambulatory Visit (INDEPENDENT_AMBULATORY_CARE_PROVIDER_SITE_OTHER): Payer: Medicare Other | Admitting: Internal Medicine

## 2014-08-26 ENCOUNTER — Encounter: Payer: Self-pay | Admitting: Internal Medicine

## 2014-08-26 VITALS — BP 130/74 | HR 78 | Temp 97.6°F | Ht 66.0 in | Wt 239.8 lb

## 2014-08-26 DIAGNOSIS — E669 Obesity, unspecified: Secondary | ICD-10-CM

## 2014-08-26 DIAGNOSIS — I10 Essential (primary) hypertension: Secondary | ICD-10-CM | POA: Diagnosis not present

## 2014-08-26 DIAGNOSIS — E785 Hyperlipidemia, unspecified: Secondary | ICD-10-CM | POA: Diagnosis not present

## 2014-08-26 DIAGNOSIS — IMO0002 Reserved for concepts with insufficient information to code with codable children: Secondary | ICD-10-CM

## 2014-08-26 DIAGNOSIS — E1165 Type 2 diabetes mellitus with hyperglycemia: Secondary | ICD-10-CM

## 2014-08-26 DIAGNOSIS — E1129 Type 2 diabetes mellitus with other diabetic kidney complication: Secondary | ICD-10-CM

## 2014-08-26 DIAGNOSIS — N182 Chronic kidney disease, stage 2 (mild): Secondary | ICD-10-CM | POA: Diagnosis not present

## 2014-08-26 NOTE — Progress Notes (Signed)
Patient ID: Douglas Soto, male   DOB: 10/30/1940, 74 y.o.   MRN: [id number]    Facility  [facility]    Place of Service:   OFFICE    No Known Allergies  Chief Complaint  Patient presents with  . Medical Management of Chronic Issues    3 Month follow up    HPI:  DM type 2, uncontrolled, with renal complications: improving. A1c was 11.2 3 mo ago, now 8.6.  Essential hypertension, malignant: controlled  Chronic kidney disease, stage II (mild): unchanged  Obesity: unchanged. Non compliant with diet  Hyperlipidemia: needs follow up    Medications: Patient's Medications  New Prescriptions   No medications on file  Previous Medications   ALLOPURINOL (ZYLOPRIM) 300 MG TABLET    take 1 tablet by mouth once daily   DOXAZOSIN (CARDURA) 8 MG TABLET    take 1 tablet by mouth once daily for blood pressure   HYDROCODONE-ACETAMINOPHEN (NORCO) 10-325 MG PER TABLET    Take One tablet by mouth every 6 hours as needed for pain control   METFORMIN (GLUCOPHAGE) 1000 MG TABLET    One each morning to control diabetes   METOPROLOL (LOPRESSOR) 50 MG TABLET    One twice daily to control BP and heart rhythm   MULTIVITAMIN-IRON-MINERALS-FOLIC ACID (CENTRUM) CHEWABLE TABLET    Chew 1 tablet by mouth daily.   TRIBENZOR 40-10-25 MG TABS    take 1 tablet by mouth once daily for blood pressure  Modified Medications   No medications on file  Discontinued Medications   No medications on file     Review of Systems  Constitutional: Negative.   HENT: Positive for tinnitus.   Eyes: Negative.   Respiratory: Negative.   Cardiovascular: Positive for palpitations and leg swelling. Negative for chest pain.  Gastrointestinal: Negative.   Endocrine: Negative for heat intolerance, polydipsia, polyphagia and polyuria.       Diabetic  Genitourinary: Negative.   Musculoskeletal: Positive for back pain.       Hx gout  Skin: Negative.   Neurological: Negative.   Hematological: Negative.     Psychiatric/Behavioral: Negative.     Filed Vitals:   08/26/14 1146 08/26/14 1156  BP: 148/82 130/74  Pulse: 84 78  Temp: 97.6 F (36.4 C)   TempSrc: Oral   Height: 5\' 6"  (1.676 m)   Weight: 239 lb 12.8 oz (108.773 kg)    Body mass index is 38.72 kg/(m^2).  Physical Exam  Constitutional: He is oriented to person, place, and time. No distress.  overweight  HENT:  Head: Normocephalic and atraumatic.  Right Ear: External ear normal.  Left Ear: External ear normal.  Eyes: Conjunctivae and EOM are normal. Pupils are equal, round, and reactive to light.  Neck: Normal range of motion. Neck supple. No JVD present. No tracheal deviation present. No thyromegaly present.  Cardiovascular: Normal rate and intact distal pulses.  Exam reveals no gallop and no friction rub.   No murmur heard. AF  Pulmonary/Chest: Effort normal and breath sounds normal. No respiratory distress. He has no wheezes. He has no rales.  Abdominal: Soft. Bowel sounds are normal. He exhibits no distension and no mass. There is no tenderness.  Musculoskeletal: Normal range of motion. He exhibits edema (1+ bipedal).  Tender in lower back and hips.  Lymphadenopathy:    He has no cervical adenopathy.  Neurological: He is alert and oriented to person, place, and time. He has normal reflexes. No cranial nerve deficit. Coordination normal.  Skin:  No rash noted. No erythema. No pallor.  Psychiatric: He has a normal mood and affect. His behavior is normal. Judgment and thought content normal.     Labs reviewed: Appointment on 08/20/2014  Component Date Value Ref Range Status  . Microalbum.,U,Random 08/20/2014 19.0* 0.0 - 17.0 ug/mL Final   Comment: **Effective Aug 24, 2014 the reference interval for**   Microalbumin, Urine will be changing to:                                         Not Estab.   Appointment on 08/18/2014  Component Date Value Ref Range Status  . Hgb A1c MFr Bld 08/18/2014 8.6* 4.8 - 5.6 % Final    Comment:          Pre-diabetes: 5.7 - 6.4          Diabetes: >6.4          Glycemic control for adults with diabetes: <7.0   . Est. average glucose Bld gHb Est-m* 08/18/2014 200   Final  . Glucose 08/18/2014 190* 65 - 99 mg/dL Final  . BUN 08/18/2014 16  8 - 27 mg/dL Final  . Creatinine, Ser 08/18/2014 1.47* 0.76 - 1.27 mg/dL Final  . GFR calc non Af Amer 08/18/2014 47* >59 mL/min/1.73 Final  . GFR calc Af Amer 08/18/2014 54* >59 mL/min/1.73 Final  . BUN/Creatinine Ratio 08/18/2014 11  10 - 22 Final  . Sodium 08/18/2014 141  134 - 144 mmol/L Final  . Potassium 08/18/2014 4.0  3.5 - 5.2 mmol/L Final  . Chloride 08/18/2014 98  97 - 108 mmol/L Final  . CO2 08/18/2014 25  18 - 29 mmol/L Final  . Calcium 08/18/2014 10.0  8.6 - 10.2 mg/dL Final     Assessment/Plan  1. DM type 2, uncontrolled, with renal complications continue current meds. Has dropped the A1C  By nearly 3 points since last visit - Hemoglobin A1c; Future - Basic metabolic panel; Future  2. Essential hypertension, malignant - Basic metabolic panel; Future  3. Chronic kidney disease, stage II (mild) stable  4. Obesity Continue to diet  5. Hyperlipidemia - Lipid panel; Future

## 2014-08-29 ENCOUNTER — Other Ambulatory Visit: Payer: Self-pay | Admitting: Internal Medicine

## 2014-09-18 ENCOUNTER — Other Ambulatory Visit: Payer: Self-pay | Admitting: *Deleted

## 2014-09-18 DIAGNOSIS — M545 Low back pain, unspecified: Secondary | ICD-10-CM

## 2014-09-18 MED ORDER — HYDROCODONE-ACETAMINOPHEN 10-325 MG PO TABS
ORAL_TABLET | ORAL | Status: DC
Start: 2014-09-18 — End: 2014-10-20

## 2014-09-18 NOTE — Telephone Encounter (Signed)
Patient requested and will pick up Monday.

## 2014-10-20 ENCOUNTER — Other Ambulatory Visit: Payer: Self-pay

## 2014-10-20 DIAGNOSIS — M545 Low back pain, unspecified: Secondary | ICD-10-CM

## 2014-10-20 MED ORDER — HYDROCODONE-ACETAMINOPHEN 10-325 MG PO TABS: ORAL_TABLET | ORAL | Status: DC

## 2014-10-20 NOTE — Telephone Encounter (Signed)
Patient aware rx will be available with in the next hour

## 2014-10-28 ENCOUNTER — Other Ambulatory Visit: Payer: Self-pay | Admitting: Internal Medicine

## 2014-11-19 ENCOUNTER — Other Ambulatory Visit: Payer: Self-pay | Admitting: *Deleted

## 2014-11-19 DIAGNOSIS — M545 Low back pain, unspecified: Secondary | ICD-10-CM

## 2014-11-19 MED ORDER — HYDROCODONE-ACETAMINOPHEN 10-325 MG PO TABS: ORAL_TABLET | ORAL | Status: DC

## 2014-11-19 NOTE — Telephone Encounter (Signed)
Patient requested and will pick up 

## 2014-12-07 ENCOUNTER — Other Ambulatory Visit: Payer: Medicare Other

## 2014-12-07 DIAGNOSIS — E1129 Type 2 diabetes mellitus with other diabetic kidney complication: Secondary | ICD-10-CM | POA: Diagnosis not present

## 2014-12-07 DIAGNOSIS — E1165 Type 2 diabetes mellitus with hyperglycemia: Principal | ICD-10-CM

## 2014-12-07 DIAGNOSIS — E785 Hyperlipidemia, unspecified: Secondary | ICD-10-CM

## 2014-12-07 DIAGNOSIS — I10 Essential (primary) hypertension: Secondary | ICD-10-CM

## 2014-12-07 DIAGNOSIS — IMO0002 Reserved for concepts with insufficient information to code with codable children: Secondary | ICD-10-CM

## 2014-12-08 LAB — LIPID PANEL
Chol/HDL Ratio: 5.6 ratio units — ABNORMAL HIGH (ref 0.0–5.0)
Cholesterol, Total: 201 mg/dL — ABNORMAL HIGH (ref 100–199)
HDL: 36 mg/dL — AB (ref >39)
LDL CALC: 115 mg/dL — AB (ref 0–99)
Triglycerides: 252 mg/dL — ABNORMAL HIGH (ref 0–149)
VLDL CHOLESTEROL CAL: 50 mg/dL — AB (ref 5–40)

## 2014-12-08 LAB — BASIC METABOLIC PANEL
BUN / CREAT RATIO: 17 (ref 10–22)
BUN: 27 mg/dL (ref 8–27)
CALCIUM: 10.9 mg/dL — AB (ref 8.6–10.2)
CHLORIDE: 93 mmol/L — AB (ref 97–108)
CO2: 30 mmol/L — ABNORMAL HIGH (ref 18–29)
CREATININE: 1.57 mg/dL — AB (ref 0.76–1.27)
GFR calc Af Amer: 50 mL/min/{1.73_m2} — ABNORMAL LOW (ref >59)
GFR calc non Af Amer: 43 mL/min/{1.73_m2} — ABNORMAL LOW (ref >59)
Glucose: 312 mg/dL — ABNORMAL HIGH (ref 65–99)
Potassium: 3.7 mmol/L (ref 3.5–5.2)
Sodium: 140 mmol/L (ref 134–144)

## 2014-12-08 LAB — HEMOGLOBIN A1C
ESTIMATED AVERAGE GLUCOSE: 283 mg/dL
Hgb A1c MFr Bld: 11.5 % — ABNORMAL HIGH (ref 4.8–5.6)

## 2014-12-09 ENCOUNTER — Ambulatory Visit (INDEPENDENT_AMBULATORY_CARE_PROVIDER_SITE_OTHER): Payer: Medicare Other | Admitting: Internal Medicine

## 2014-12-09 ENCOUNTER — Encounter: Payer: Self-pay | Admitting: Internal Medicine

## 2014-12-09 VITALS — BP 118/68 | HR 81 | Temp 97.3°F | Resp 20 | Ht 66.0 in | Wt 232.0 lb

## 2014-12-09 DIAGNOSIS — E669 Obesity, unspecified: Secondary | ICD-10-CM | POA: Diagnosis not present

## 2014-12-09 DIAGNOSIS — E1129 Type 2 diabetes mellitus with other diabetic kidney complication: Secondary | ICD-10-CM | POA: Diagnosis not present

## 2014-12-09 DIAGNOSIS — E1165 Type 2 diabetes mellitus with hyperglycemia: Secondary | ICD-10-CM

## 2014-12-09 DIAGNOSIS — E785 Hyperlipidemia, unspecified: Secondary | ICD-10-CM

## 2014-12-09 DIAGNOSIS — I10 Essential (primary) hypertension: Secondary | ICD-10-CM | POA: Diagnosis not present

## 2014-12-09 DIAGNOSIS — N182 Chronic kidney disease, stage 2 (mild): Secondary | ICD-10-CM | POA: Diagnosis not present

## 2014-12-09 DIAGNOSIS — IMO0002 Reserved for concepts with insufficient information to code with codable children: Secondary | ICD-10-CM

## 2014-12-09 MED ORDER — EMPAGLIFLOZIN 10 MG PO TABS: ORAL_TABLET | ORAL | Status: DC

## 2014-12-09 NOTE — Progress Notes (Signed)
Patient ID: Douglas Soto, male   DOB: 02-02-1941, 74 y.o.   MRN: [id number]    Facility  [facility]    Place of Service:   OFFICE    No Known Allergies  Chief Complaint  Patient presents with  . Medical Management of Chronic Issues    3 month follow-up    HPI:  DM type 2, uncontrolled, with renal complications - noncompliant with diet. Eating chocolates. Hemoglobin A1c is much higher than 3 months ago. A1c now 11.5. Patient has experienced increasing episodes of malaise and nausea since the metformin was increased. He denies diarrhea.  Essential hypertension, malignant - controlled  Chronic kidney disease, stage II (mild) - unchanged  Hyperlipidemia - mild elevation in LDL and slightly low HDL   Obesity - 7 pound weight loss since last seen    Medications: Patient's Medications  New Prescriptions   No medications on file  Previous Medications   ALLOPURINOL (ZYLOPRIM) 300 MG TABLET    take 1 tablet by mouth once daily   DOXAZOSIN (CARDURA) 8 MG TABLET    take 1 tablet by mouth once daily for blood pressure   HYDROCODONE-ACETAMINOPHEN (NORCO) 10-325 MG PER TABLET    Take One tablet by mouth every 6 hours as needed for pain control   METFORMIN (GLUCOPHAGE) 1000 MG TABLET    take 1 tablet by mouth every morning TO CONTROL DIABETES   METOPROLOL (LOPRESSOR) 50 MG TABLET    take 1 tablet by mouth twice a day TO CONTROL BP AND HEART RHYTHM   MULTIVITAMIN-IRON-MINERALS-FOLIC ACID (CENTRUM) CHEWABLE TABLET    Chew 1 tablet by mouth daily.   TRIBENZOR 40-10-25 MG TABS    take 1 tablet by mouth once daily for blood pressure  Modified Medications   No medications on file  Discontinued Medications   No medications on file     Review of Systems  Constitutional: Negative.   HENT: Positive for tinnitus.   Eyes: Negative.   Respiratory: Negative.   Cardiovascular: Positive for palpitations. Negative for chest pain and leg swelling.  Gastrointestinal: Negative.   Endocrine: Negative  for heat intolerance, polydipsia, polyphagia and polyuria.       Diabetic  Genitourinary: Negative.   Musculoskeletal: Positive for back pain.       Hx gout  Skin: Negative.   Neurological: Negative.   Hematological: Negative.   Psychiatric/Behavioral: Negative.   All other systems reviewed and are negative.   Filed Vitals:   12/09/14 1127  BP: 118/68  Pulse: 81  Temp: 97.3 F (36.3 C)  TempSrc: Oral  Resp: 20  Height: 5\' 6"  (1.676 m)  Weight: 232 lb (105.235 kg)  SpO2: 96%   Body mass index is 37.46 kg/(m^2).  Physical Exam  Constitutional: He is oriented to person, place, and time. No distress.  overweight  HENT:  Head: Normocephalic and atraumatic.  Right Ear: External ear normal.  Left Ear: External ear normal.  Eyes: Conjunctivae and EOM are normal. Pupils are equal, round, and reactive to light.  Neck: Normal range of motion. Neck supple. No JVD present. No tracheal deviation present. No thyromegaly present.  Cardiovascular: Normal rate, regular rhythm and intact distal pulses.  Exam reveals no gallop and no friction rub.   No murmur heard. Pulmonary/Chest: Effort normal and breath sounds normal. No respiratory distress. He has no wheezes. He has no rales.  Abdominal: Soft. Bowel sounds are normal. He exhibits no distension and no mass. There is no tenderness.  Musculoskeletal: Normal range  of motion. He exhibits no edema.  Tender in lower back and hips.  Lymphadenopathy:    He has no cervical adenopathy.  Neurological: He is alert and oriented to person, place, and time. He has normal reflexes. No cranial nerve deficit. Coordination normal.  Skin: No rash noted. No erythema. No pallor.  Psychiatric: He has a normal mood and affect. His behavior is normal. Judgment and thought content normal.     Labs reviewed: Appointment on 12/07/2014  Component Date Value Ref Range Status  . Hgb A1c MFr Bld 12/07/2014 11.5* 4.8 - 5.6 % Final   Comment:           Pre-diabetes: 5.7 - 6.4          Diabetes: >6.4          Glycemic control for adults with diabetes: <7.0   . Est. average glucose Bld gHb Est-m* 12/07/2014 283   Final  . Glucose 12/07/2014 312* 65 - 99 mg/dL Final  . BUN 12/07/2014 27  8 - 27 mg/dL Final  . Creatinine, Ser 12/07/2014 1.57* 0.76 - 1.27 mg/dL Final  . GFR calc non Af Amer 12/07/2014 43* >59 mL/min/1.73 Final  . GFR calc Af Amer 12/07/2014 50* >59 mL/min/1.73 Final  . BUN/Creatinine Ratio 12/07/2014 17  10 - 22 Final  . Sodium 12/07/2014 140  134 - 144 mmol/L Final  . Potassium 12/07/2014 3.7  3.5 - 5.2 mmol/L Final  . Chloride 12/07/2014 93* 97 - 108 mmol/L Final  . CO2 12/07/2014 30* 18 - 29 mmol/L Final  . Calcium 12/07/2014 10.9* 8.6 - 10.2 mg/dL Final  . Cholesterol, Total 12/07/2014 201* 100 - 199 mg/dL Final  . Triglycerides 12/07/2014 252* 0 - 149 mg/dL Final  . HDL 12/07/2014 36* >39 mg/dL Final   Comment: According to ATP-III Guidelines, HDL-C >59 mg/dL is considered a negative risk factor for CHD.   Marland Kitchen VLDL Cholesterol Cal 12/07/2014 50* 5 - 40 mg/dL Final  . LDL Calculated 12/07/2014 115* 0 - 99 mg/dL Final  . Chol/HDL Ratio 12/07/2014 5.6* 0.0 - 5.0 ratio units Final   Comment:                                   T. Chol/HDL Ratio                                             Men  Women                               1/2 Avg.Risk  3.4    3.3                                   Avg.Risk  5.0    4.4                                2X Avg.Risk  9.6    7.1                                3X Avg.Risk 23.4  11.0      Assessment/Plan  1. DM type 2, uncontrolled, with renal complications Discontinue metformin due to complaints of nausea since we increased the dose - empagliflozin (JARDIANCE) 10 MG TABS tablet; One each morning to control diabetes  Dispense: 30 tablet; Refill: 5 - Hemoglobin A1c; Future - Basic metabolic panel; Future  2. Essential hypertension, malignant Continue current medication - Basic  metabolic panel; Future  3. Chronic kidney disease, stage II (mild) Continue to observe - Basic metabolic panel; Future  4. Hyperlipidemia Continue to observe - Lipid panel; Future  5. Obesity Encouraged continued weight loss

## 2014-12-23 ENCOUNTER — Other Ambulatory Visit: Payer: Self-pay

## 2014-12-23 DIAGNOSIS — M545 Low back pain, unspecified: Secondary | ICD-10-CM

## 2014-12-23 MED ORDER — HYDROCODONE-ACETAMINOPHEN 10-325 MG PO TABS: ORAL_TABLET | ORAL | Status: DC

## 2015-01-01 ENCOUNTER — Other Ambulatory Visit: Payer: Self-pay | Admitting: Internal Medicine

## 2015-01-21 ENCOUNTER — Other Ambulatory Visit: Payer: Self-pay

## 2015-01-22 ENCOUNTER — Other Ambulatory Visit: Payer: Self-pay | Admitting: *Deleted

## 2015-01-22 DIAGNOSIS — M545 Low back pain, unspecified: Secondary | ICD-10-CM

## 2015-01-22 MED ORDER — HYDROCODONE-ACETAMINOPHEN 10-325 MG PO TABS: ORAL_TABLET | ORAL | Status: DC

## 2015-01-22 NOTE — Telephone Encounter (Signed)
Patient requested and will pick up 

## 2015-02-22 ENCOUNTER — Other Ambulatory Visit: Payer: Self-pay | Admitting: *Deleted

## 2015-02-22 DIAGNOSIS — M545 Low back pain, unspecified: Secondary | ICD-10-CM

## 2015-02-22 MED ORDER — HYDROCODONE-ACETAMINOPHEN 10-325 MG PO TABS: ORAL_TABLET | ORAL | Status: DC

## 2015-02-22 NOTE — Telephone Encounter (Signed)
Patient requested and will pick up. Narcotic contract printed. 

## 2015-03-08 ENCOUNTER — Other Ambulatory Visit: Payer: Medicare Other

## 2015-03-08 DIAGNOSIS — E1165 Type 2 diabetes mellitus with hyperglycemia: Secondary | ICD-10-CM | POA: Diagnosis not present

## 2015-03-08 DIAGNOSIS — E785 Hyperlipidemia, unspecified: Secondary | ICD-10-CM | POA: Diagnosis not present

## 2015-03-08 DIAGNOSIS — I10 Essential (primary) hypertension: Secondary | ICD-10-CM

## 2015-03-08 DIAGNOSIS — N182 Chronic kidney disease, stage 2 (mild): Secondary | ICD-10-CM | POA: Diagnosis not present

## 2015-03-08 DIAGNOSIS — E1129 Type 2 diabetes mellitus with other diabetic kidney complication: Secondary | ICD-10-CM | POA: Diagnosis not present

## 2015-03-08 DIAGNOSIS — IMO0002 Reserved for concepts with insufficient information to code with codable children: Secondary | ICD-10-CM

## 2015-03-09 LAB — LIPID PANEL
CHOL/HDL RATIO: 5.4 ratio — AB (ref 0.0–5.0)
CHOLESTEROL TOTAL: 195 mg/dL (ref 100–199)
HDL: 36 mg/dL — AB (ref >39)
LDL CALC: 123 mg/dL — AB (ref 0–99)
TRIGLYCERIDES: 180 mg/dL — AB (ref 0–149)
VLDL Cholesterol Cal: 36 mg/dL (ref 5–40)

## 2015-03-09 LAB — BASIC METABOLIC PANEL
BUN/Creatinine Ratio: 14 (ref 10–22)
BUN: 20 mg/dL (ref 8–27)
CALCIUM: 10 mg/dL (ref 8.6–10.2)
CO2: 33 mmol/L — AB (ref 18–29)
CREATININE: 1.42 mg/dL — AB (ref 0.76–1.27)
Chloride: 93 mmol/L — ABNORMAL LOW (ref 97–106)
GFR calc Af Amer: 56 mL/min/{1.73_m2} — ABNORMAL LOW (ref >59)
GFR, EST NON AFRICAN AMERICAN: 49 mL/min/{1.73_m2} — AB (ref >59)
GLUCOSE: 296 mg/dL — AB (ref 65–99)
POTASSIUM: 4.1 mmol/L (ref 3.5–5.2)
Sodium: 138 mmol/L (ref 136–144)

## 2015-03-09 LAB — HEMOGLOBIN A1C
Est. average glucose Bld gHb Est-mCnc: 292 mg/dL
Hgb A1c MFr Bld: 11.8 % — ABNORMAL HIGH (ref 4.8–5.6)

## 2015-03-10 ENCOUNTER — Encounter: Payer: Self-pay | Admitting: Internal Medicine

## 2015-03-10 ENCOUNTER — Encounter: Payer: Medicare Other | Admitting: Internal Medicine

## 2015-03-10 NOTE — Patient Instructions (Signed)
Bring copy of Advance Directives- Health Care Power of Attorney and/or Living Will to next appointment.   

## 2015-03-24 ENCOUNTER — Other Ambulatory Visit: Payer: Self-pay

## 2015-03-24 ENCOUNTER — Ambulatory Visit: Payer: Medicare Other | Admitting: Internal Medicine

## 2015-03-24 DIAGNOSIS — M545 Low back pain, unspecified: Secondary | ICD-10-CM

## 2015-03-24 MED ORDER — HYDROCODONE-ACETAMINOPHEN 10-325 MG PO TABS: ORAL_TABLET | ORAL | Status: DC

## 2015-03-30 ENCOUNTER — Ambulatory Visit (INDEPENDENT_AMBULATORY_CARE_PROVIDER_SITE_OTHER): Payer: Medicare Other | Admitting: Internal Medicine

## 2015-03-30 ENCOUNTER — Encounter: Payer: Self-pay | Admitting: Internal Medicine

## 2015-03-30 VITALS — BP 148/82 | HR 83 | Temp 97.4°F | Resp 20 | Ht 66.0 in | Wt 241.2 lb

## 2015-03-30 DIAGNOSIS — N182 Chronic kidney disease, stage 2 (mild): Secondary | ICD-10-CM

## 2015-03-30 DIAGNOSIS — M545 Low back pain: Secondary | ICD-10-CM

## 2015-03-30 DIAGNOSIS — G8929 Other chronic pain: Secondary | ICD-10-CM | POA: Diagnosis not present

## 2015-03-30 DIAGNOSIS — E1165 Type 2 diabetes mellitus with hyperglycemia: Secondary | ICD-10-CM | POA: Diagnosis not present

## 2015-03-30 DIAGNOSIS — E1122 Type 2 diabetes mellitus with diabetic chronic kidney disease: Secondary | ICD-10-CM

## 2015-03-30 DIAGNOSIS — N181 Chronic kidney disease, stage 1: Secondary | ICD-10-CM | POA: Diagnosis not present

## 2015-03-30 DIAGNOSIS — E785 Hyperlipidemia, unspecified: Secondary | ICD-10-CM | POA: Diagnosis not present

## 2015-03-30 DIAGNOSIS — IMO0002 Reserved for concepts with insufficient information to code with codable children: Secondary | ICD-10-CM

## 2015-03-30 DIAGNOSIS — I10 Essential (primary) hypertension: Secondary | ICD-10-CM

## 2015-03-30 MED ORDER — EMPAGLIFLOZIN 10 MG PO TABS: ORAL_TABLET | ORAL | Status: DC

## 2015-03-30 NOTE — Progress Notes (Signed)
Patient ID: Douglas Soto, male   DOB: 09-29-1940, 74 y.o.   MRN: [id number]    Facility  [facility]    Place of Service:   OFFICE    No Known Allergies  Chief Complaint  Patient presents with  . Medical Management of Chronic Issues    3 month follow-up for Hypertension ,DM, Hyperlipidemia    HPI:  Uncontrolled type 2 diabetes mellitus with stage 1 chronic kidney disease, without long-term current use of insulin (HCC) - has not been taking empagliflozin (JARDIANCE) 10 MG TABS table. Very poor dietary compliance  Chronic kidney disease, stage II (mild) - stable  Essential hypertension, malignant - mild elevation in SBP. No headache or chest pain  Hyperlipidemia - rising LDL. Poor dietary compliance  Chronic midline low back pain without sciatica - hurts every day    Medications: Patient's Medications  New Prescriptions   No medications on file  Previous Medications   ALLOPURINOL (ZYLOPRIM) 300 MG TABLET    take 1 tablet by mouth once daily   DOXAZOSIN (CARDURA) 8 MG TABLET    take 1 tablet by mouth once daily for blood pressure   EMPAGLIFLOZIN (JARDIANCE) 10 MG TABS TABLET    One each morning to control diabetes   HYDROCODONE-ACETAMINOPHEN (NORCO) 10-325 MG TABLET    Take One tablet by mouth every 6 hours as needed for pain control   METOPROLOL (LOPRESSOR) 50 MG TABLET    take 1 tablet by mouth twice a day TO CONTROL BP AND HEART RHYTHM   MULTIVITAMIN-IRON-MINERALS-FOLIC ACID (CENTRUM) CHEWABLE TABLET    Chew 1 tablet by mouth daily.   TRIBENZOR 40-10-25 MG TABS    take 1 tablet by mouth once daily for blood pressure  Modified Medications   No medications on file  Discontinued Medications   No medications on file    Review of Systems  Constitutional:       Gaining weight  HENT: Positive for tinnitus.   Eyes: Negative.   Respiratory: Negative.   Cardiovascular: Positive for palpitations. Negative for chest pain and leg swelling.  Gastrointestinal: Negative.   Endocrine:  Negative for heat intolerance, polydipsia, polyphagia and polyuria.       Diabetic  Genitourinary: Negative.   Musculoskeletal: Positive for back pain.       Hx gout  Skin: Negative.   Neurological: Negative.   Hematological: Negative.   Psychiatric/Behavioral: Negative.   All other systems reviewed and are negative.   Filed Vitals:   03/30/15 1542  BP: 148/82  Pulse: 83  Temp: 97.4 F (36.3 C)  TempSrc: Oral  Resp: 20  Height: 5' 6"  (1.676 m)  Weight: 241 lb 3.2 oz (109.408 kg)  SpO2: 95%   Body mass index is 38.95 kg/(m^2).  Physical Exam  Constitutional: He is oriented to person, place, and time. No distress.  overweight  HENT:  Head: Normocephalic and atraumatic.  Right Ear: External ear normal.  Left Ear: External ear normal.  Eyes: Conjunctivae and EOM are normal. Pupils are equal, round, and reactive to light.  Neck: Normal range of motion. Neck supple. No JVD present. No tracheal deviation present. No thyromegaly present.  Cardiovascular: Normal rate, regular rhythm and intact distal pulses.  Exam reveals no gallop and no friction rub.   No murmur heard. Pulmonary/Chest: Effort normal and breath sounds normal. No respiratory distress. He has no wheezes. He has no rales.  Abdominal: Soft. Bowel sounds are normal. He exhibits no distension and no mass. There is no tenderness.  Musculoskeletal: Normal range of motion. He exhibits no edema.  Tender in lower back and hips.  Lymphadenopathy:    He has no cervical adenopathy.  Neurological: He is alert and oriented to person, place, and time. He has normal reflexes. No cranial nerve deficit. Coordination normal.  Skin: No rash noted. No erythema. No pallor.  Psychiatric: He has a normal mood and affect. His behavior is normal. Judgment and thought content normal.    Labs reviewed: Lab Summary Latest Ref Rng 03/08/2015 12/07/2014 08/18/2014 05/18/2014  Hemoglobin 13.0-17.0 g/dL (None) (None) (None) (None)  Hematocrit  39.0-52.0 % (None) (None) (None) (None)  White count - (None) (None) (None) (None)  Platelet count - (None) (None) (None) (None)  Sodium 136 - 144 mmol/L 138 140 141 138  Potassium 3.5 - 5.2 mmol/L 4.1 3.7 4.0 4.1  Calcium 8.6 - 10.2 mg/dL 10.0 10.9(H) 10.0 9.5  Phosphorus - (None) (None) (None) (None)  Creatinine 0.76 - 1.27 mg/dL 1.42(H) 1.57(H) 1.47(H) 1.47(H)  AST 0 - 40 IU/L (None) (None) (None) 11  Alk Phos 39 - 117 IU/L (None) (None) (None) 70  Bilirubin 0.0 - 1.2 mg/dL (None) (None) (None) 0.6  Glucose 65 - 99 mg/dL 296(H) 312(H) 190(H) 207(H)  Cholesterol - (None) (None) (None) (None)  HDL cholesterol >39 mg/dL 36(L) 36(L) (None) 48  Triglycerides 0 - 149 mg/dL 180(H) 252(H) (None) 126  LDL Direct - (None) (None) (None) (None)  LDL Calc 0 - 99 mg/dL 123(H) 115(H) (None) 118(H)  Total protein - (None) (None) (None) (None)  Albumin 3.5 - 4.8 g/dL (None) (None) (None) 4.0   No results found for: TSH, T3TOTAL, T4TOTAL, THYROIDAB Lab Results  Component Value Date   BUN 20 03/08/2015   Lab Results  Component Value Date   HGBA1C 11.8* 03/08/2015    Assessment/Plan  1. Uncontrolled type 2 diabetes mellitus with stage 1 chronic kidney disease, without long-term current use of insulin (HCC) Resume empagliflozin (JARDIANCE) 10 MG TABS tablet; One each morning to control diabetes  Dispense: 30 tablet; Refill: 5 - Hemoglobin A1c; Future - Basic metabolic panel; Future - Microalbumin, urine; Future  2. Chronic kidney disease, stage II (mild) stable  3. Essential hypertension, malignant Mild elevation in SBP  4. Hyperlipidemia Worsening LDL  5. Chronic midline low back pain without sciatica unchanged

## 2015-03-30 NOTE — Progress Notes (Signed)
This encounter was created in error - please disregard.

## 2015-04-06 ENCOUNTER — Other Ambulatory Visit: Payer: Self-pay | Admitting: Internal Medicine

## 2015-04-08 ENCOUNTER — Other Ambulatory Visit: Payer: Self-pay

## 2015-04-08 MED ORDER — OLMESARTAN-AMLODIPINE-HCTZ 40-10-25 MG PO TABS: ORAL_TABLET | ORAL | Status: DC

## 2015-04-23 ENCOUNTER — Other Ambulatory Visit: Payer: Self-pay | Admitting: *Deleted

## 2015-04-23 DIAGNOSIS — M545 Low back pain, unspecified: Secondary | ICD-10-CM

## 2015-04-23 MED ORDER — HYDROCODONE-ACETAMINOPHEN 10-325 MG PO TABS: ORAL_TABLET | ORAL | Status: DC

## 2015-04-23 NOTE — Telephone Encounter (Signed)
Patient requested and will pick up 

## 2015-05-24 ENCOUNTER — Other Ambulatory Visit: Payer: Self-pay | Admitting: *Deleted

## 2015-05-24 DIAGNOSIS — M545 Low back pain, unspecified: Secondary | ICD-10-CM

## 2015-05-24 MED ORDER — HYDROCODONE-ACETAMINOPHEN 10-325 MG PO TABS: ORAL_TABLET | ORAL | Status: DC

## 2015-05-24 NOTE — Telephone Encounter (Signed)
Patient requested and will pick up 

## 2015-06-21 ENCOUNTER — Other Ambulatory Visit: Payer: Self-pay | Admitting: *Deleted

## 2015-06-21 DIAGNOSIS — M545 Low back pain, unspecified: Secondary | ICD-10-CM

## 2015-06-21 MED ORDER — HYDROCODONE-ACETAMINOPHEN 10-325 MG PO TABS: ORAL_TABLET | ORAL | Status: DC

## 2015-06-21 NOTE — Telephone Encounter (Signed)
Patient requested and will pick up 

## 2015-06-28 ENCOUNTER — Other Ambulatory Visit: Payer: Medicare Other

## 2015-06-28 DIAGNOSIS — N181 Chronic kidney disease, stage 1: Principal | ICD-10-CM

## 2015-06-28 DIAGNOSIS — E1122 Type 2 diabetes mellitus with diabetic chronic kidney disease: Secondary | ICD-10-CM | POA: Diagnosis not present

## 2015-06-28 DIAGNOSIS — E1165 Type 2 diabetes mellitus with hyperglycemia: Principal | ICD-10-CM

## 2015-06-28 DIAGNOSIS — IMO0002 Reserved for concepts with insufficient information to code with codable children: Secondary | ICD-10-CM

## 2015-06-29 LAB — BASIC METABOLIC PANEL
BUN/Creatinine Ratio: 15 (ref 10–22)
BUN: 23 mg/dL (ref 8–27)
CHLORIDE: 95 mmol/L — AB (ref 96–106)
CO2: 30 mmol/L — AB (ref 18–29)
Calcium: 9.7 mg/dL (ref 8.6–10.2)
Creatinine, Ser: 1.52 mg/dL — ABNORMAL HIGH (ref 0.76–1.27)
GFR calc Af Amer: 52 mL/min/{1.73_m2} — ABNORMAL LOW (ref >59)
GFR calc non Af Amer: 45 mL/min/{1.73_m2} — ABNORMAL LOW (ref >59)
GLUCOSE: 142 mg/dL — AB (ref 65–99)
POTASSIUM: 4 mmol/L (ref 3.5–5.2)
SODIUM: 139 mmol/L (ref 134–144)

## 2015-06-29 LAB — HEMOGLOBIN A1C
ESTIMATED AVERAGE GLUCOSE: 183 mg/dL
HEMOGLOBIN A1C: 8 % — AB (ref 4.8–5.6)

## 2015-06-30 ENCOUNTER — Encounter: Payer: Self-pay | Admitting: Internal Medicine

## 2015-06-30 ENCOUNTER — Other Ambulatory Visit: Payer: Medicare Other

## 2015-06-30 ENCOUNTER — Ambulatory Visit (INDEPENDENT_AMBULATORY_CARE_PROVIDER_SITE_OTHER): Payer: Medicare Other | Admitting: Internal Medicine

## 2015-06-30 VITALS — BP 128/64 | HR 54 | Temp 97.7°F | Ht 66.0 in | Wt 224.0 lb

## 2015-06-30 DIAGNOSIS — M545 Low back pain, unspecified: Secondary | ICD-10-CM

## 2015-06-30 DIAGNOSIS — E1122 Type 2 diabetes mellitus with diabetic chronic kidney disease: Secondary | ICD-10-CM

## 2015-06-30 DIAGNOSIS — E1165 Type 2 diabetes mellitus with hyperglycemia: Secondary | ICD-10-CM

## 2015-06-30 DIAGNOSIS — N182 Chronic kidney disease, stage 2 (mild): Secondary | ICD-10-CM

## 2015-06-30 DIAGNOSIS — IMO0002 Reserved for concepts with insufficient information to code with codable children: Secondary | ICD-10-CM

## 2015-06-30 DIAGNOSIS — N181 Chronic kidney disease, stage 1: Secondary | ICD-10-CM | POA: Diagnosis not present

## 2015-06-30 DIAGNOSIS — I10 Essential (primary) hypertension: Secondary | ICD-10-CM | POA: Diagnosis not present

## 2015-06-30 DIAGNOSIS — E785 Hyperlipidemia, unspecified: Secondary | ICD-10-CM

## 2015-06-30 DIAGNOSIS — G8929 Other chronic pain: Secondary | ICD-10-CM

## 2015-06-30 NOTE — Progress Notes (Signed)
Patient ID: Douglas Soto, male   DOB: June 21, 1940, 75 y.o.   MRN: [id number]   Location:  [facility]  Provider: Jeanmarie Hubert, M.D.  Code Status: full Goals of Care:  Advanced Directives 06/30/2015  Does patient have an advance directive? Yes  Type of Advance Directive -  Does patient want to make changes to advanced directive? -  Copy of advanced directive(s) in chart? -     Chief Complaint  Patient presents with  . Medical Management of Chronic Issues    3 mo f/u w/ labs blood sugar, blood pressure, CKD, cholesterol    HPI: Patient is a 75 y.o. male seen today for medical management of chronic diseases.    Uncontrolled type 2 diabetes mellitus with stage 2 chronic kidney disease, without long-term current use of insulin ([facility]) - patient is following a better diet. He has lost 17 pounds. His hemoglobin A1c has dropped to 8.0 from a previous value of greater than 11.  Chronic kidney disease, stage II (mild) - stable  Essential hypertension, malignant - controlled  Chronic midline low back pain without sciatica - continues to benefit from hydrocodone                 Hyperlipidemia - controlled     Past Medical History  Diagnosis Date  . Gout, unspecified   . Chronic kidney disease, stage II (mild)   . Pain in joint, pelvic region and thigh   . Essential hypertension, malignant   . Special screening for malignant neoplasm of prostate   . Lumbago   . Depression   . Diabetes mellitus without complication ([facility])   . Hyperlipidemia   . Obesity, unspecified     Past Surgical History  Procedure Laterality Date  . Appendectomy    . Cyst excision  1998    back  . 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  Always use your most recent med list.               allopurinol 300 MG tablet  Commonly known as:  ZYLOPRIM  take 1 tablet by mouth once daily     doxazosin 8 MG tablet  Commonly known as:  CARDURA  take 1 tablet by mouth once daily for blood pressure     empagliflozin 10 MG Tabs tablet  Commonly known as:  JARDIANCE  One each morning to control diabetes     HYDROcodone-acetaminophen 10-325 MG tablet  Commonly known as:  NORCO  Take One tablet by mouth every 6 hours as needed for pain control     metoprolol 50 MG tablet  Commonly known as:  LOPRESSOR  take 1 tablet by mouth twice a day TO CONTROL BP AND HEART RHYTHM     multivitamin-iron-minerals-folic acid chewable tablet  Chew 1 tablet by mouth daily.     Olmesartan-Amlodipine-HCTZ 40-10-25 MG Tabs  Commonly known as:  TRIBENZOR  take 1 tablet by mouth once daily for blood pressure        Review of Systems:  Review of Systems  Constitutional:       Gaining weight  HENT: Positive for tinnitus.   Eyes: Negative.   Respiratory: Negative.   Cardiovascular: Positive for palpitations. Negative for chest pain and leg swelling.  Gastrointestinal: Negative.   Endocrine: Negative for heat intolerance, polydipsia,  polyphagia and polyuria.       Diabetic  Genitourinary: Negative.   Musculoskeletal: Positive for back pain.       Hx gout  Skin: Negative.   Neurological: Negative.   Hematological: Negative.   Psychiatric/Behavioral: Negative.   All other systems reviewed and are negative.   Health Maintenance  Topic Date Due  . PNA vac Low Risk Adult (2 of 2 - PCV13) 04/17/2009  . TETANUS/TDAP  06/15/2012  . INFLUENZA VACCINE  10/16/2015 (Originally 11/16/2014)  . OPHTHALMOLOGY EXAM  04/17/2016 (Originally 01/24/1952)  . COLONOSCOPY  04/17/2016 (Originally 01/24/1992)  . FOOT EXAM  08/26/2015  . HEMOGLOBIN A1C  12/29/2015  . ZOSTAVAX  Completed    Physical Exam: Filed Vitals:   06/30/15 1427  BP: 128/64  Pulse: 54  Temp: 97.7 F (36.5 C)  TempSrc: Oral  Height: 5\' 6"  (1.676 m)  Weight: 224 lb (101.606 kg)  SpO2: 96%   Body mass index is 36.17  kg/(m^2). Physical Exam  Constitutional: He is oriented to person, place, and time. No distress.  overweight  HENT:  Head: Normocephalic and atraumatic.  Right Ear: External ear normal.  Left Ear: External ear normal.  Eyes: Conjunctivae and EOM are normal. Pupils are equal, round, and reactive to light.  Neck: Normal range of motion. Neck supple. No JVD present. No tracheal deviation present. No thyromegaly present.  Cardiovascular: Normal rate, regular rhythm and intact distal pulses.  Exam reveals no gallop and no friction rub.   No murmur heard. Pulmonary/Chest: Effort normal and breath sounds normal. No respiratory distress. He has no wheezes. He has no rales.  Abdominal: Soft. Bowel sounds are normal. He exhibits no distension and no mass. There is no tenderness.  Musculoskeletal: Normal range of motion. He exhibits no edema.  Tender in lower back and hips.  Lymphadenopathy:    He has no cervical adenopathy.  Neurological: He is alert and oriented to person, place, and time. He has normal reflexes. No cranial nerve deficit. Coordination normal.  Skin: No rash noted. No erythema. No pallor.  Psychiatric: He has a normal mood and affect. His behavior is normal. Judgment and thought content normal.    Labs reviewed: Basic Metabolic Panel:  Recent Labs  12/07/14 0933 03/08/15 1116 06/28/15 0900  NA 140 138 139  K 3.7 4.1 4.0  CL 93* 93* 95*  CO2 30* 33* 30*  GLUCOSE 312* 296* 142*  BUN 27 20 23   CREATININE 1.57* 1.42* 1.52*  CALCIUM 10.9* 10.0 9.7   Liver Function Tests: No results for input(s): AST, ALT, ALKPHOS, BILITOT, PROT, ALBUMIN in the last 8760 hours. No results for input(s): LIPASE, AMYLASE in the last 8760 hours. No results for input(s): AMMONIA in the last 8760 hours. CBC: No results for input(s): WBC, NEUTROABS, HGB, HCT, MCV, PLT in the last 8760 hours. Lipid Panel:  Recent Labs  12/07/14 0933 03/08/15 1116  CHOL 201* 195  HDL 36* 36*  LDLCALC  115* 123*  TRIG 252* 180*  CHOLHDL 5.6* 5.4*   Lab Results  Component Value Date   HGBA1C 8.0* 06/28/2015    Procedures since last visit: No results found.  Assessment/Plan 1. Uncontrolled type 2 diabetes mellitus with stage 2 chronic kidney disease, without long-term current use of insulin (HCC) - Hemoglobin A1c; Future - Basic metabolic panel; Future - Microalbumin, urine; Future  2. Chronic kidney disease, stage II (mild) - Basic metabolic panel; Future  3. Essential hypertension, malignant - Basic metabolic panel; Future  4.  Chronic midline low back pain without sciatica Continue pain medicines as needed  5. Hyperlipidemia - Lipid panel; Future

## 2015-07-01 LAB — MICROALBUMIN, URINE: Microalbumin, Urine: 16.8 ug/mL

## 2015-07-23 ENCOUNTER — Other Ambulatory Visit: Payer: Self-pay | Admitting: *Deleted

## 2015-07-23 DIAGNOSIS — M545 Low back pain, unspecified: Secondary | ICD-10-CM

## 2015-07-23 MED ORDER — HYDROCODONE-ACETAMINOPHEN 10-325 MG PO TABS: 1.0000 | ORAL_TABLET | Freq: Four times a day (QID) | ORAL | Status: DC | PRN

## 2015-08-09 ENCOUNTER — Other Ambulatory Visit: Payer: Self-pay | Admitting: *Deleted

## 2015-08-09 MED ORDER — DOXAZOSIN MESYLATE 8 MG PO TABS: ORAL_TABLET | ORAL | Status: DC

## 2015-08-09 NOTE — Telephone Encounter (Signed)
[facility] 

## 2015-08-18 ENCOUNTER — Encounter: Payer: Self-pay | Admitting: Internal Medicine

## 2015-08-20 ENCOUNTER — Other Ambulatory Visit: Payer: Self-pay | Admitting: *Deleted

## 2015-08-20 DIAGNOSIS — M545 Low back pain, unspecified: Secondary | ICD-10-CM

## 2015-08-20 MED ORDER — HYDROCODONE-ACETAMINOPHEN 10-325 MG PO TABS: 1.0000 | ORAL_TABLET | Freq: Four times a day (QID) | ORAL | Status: DC | PRN

## 2015-08-20 NOTE — Telephone Encounter (Signed)
Patient requested and will pick up 

## 2015-09-17 ENCOUNTER — Telehealth: Payer: Self-pay | Admitting: Internal Medicine

## 2015-09-17 ENCOUNTER — Other Ambulatory Visit: Payer: Self-pay | Admitting: Internal Medicine

## 2015-09-17 ENCOUNTER — Other Ambulatory Visit: Payer: Self-pay | Admitting: *Deleted

## 2015-09-17 DIAGNOSIS — R609 Edema, unspecified: Secondary | ICD-10-CM

## 2015-09-17 DIAGNOSIS — M545 Low back pain, unspecified: Secondary | ICD-10-CM

## 2015-09-17 MED ORDER — FUROSEMIDE 20 MG PO TABS: ORAL_TABLET | ORAL | Status: DC

## 2015-09-17 MED ORDER — HYDROCODONE-ACETAMINOPHEN 10-325 MG PO TABS: 1.0000 | ORAL_TABLET | Freq: Four times a day (QID) | ORAL | Status: DC | PRN

## 2015-09-17 NOTE — Telephone Encounter (Signed)
Patient was notified that Rx is ready to be picked up.

## 2015-09-17 NOTE — Telephone Encounter (Signed)
Received call from patient saying he had swelling of the feet and ankles for weeks. Getting concerned. Non compliant with diet.  Appt in 2 months.  Sent new Rx for furosemide 20 mg qd to his pharmacy.

## 2015-09-17 NOTE — Telephone Encounter (Signed)
Patient requested and will pick up 

## 2015-10-11 ENCOUNTER — Other Ambulatory Visit: Payer: Self-pay | Admitting: *Deleted

## 2015-10-11 MED ORDER — OLMESARTAN-AMLODIPINE-HCTZ 40-10-25 MG PO TABS: ORAL_TABLET | ORAL | Status: DC

## 2015-10-11 NOTE — Telephone Encounter (Signed)
[facility] 

## 2015-10-22 ENCOUNTER — Other Ambulatory Visit: Payer: Self-pay

## 2015-10-22 DIAGNOSIS — M545 Low back pain, unspecified: Secondary | ICD-10-CM

## 2015-10-22 MED ORDER — HYDROCODONE-ACETAMINOPHEN 10-325 MG PO TABS: 1.0000 | ORAL_TABLET | Freq: Four times a day (QID) | ORAL | Status: DC | PRN

## 2015-11-09 NOTE — Addendum Note (Signed)
Addended by: Moshe Cipro MESHELL A on: 11/09/2015 04:21 PM   Modules accepted: Orders

## 2015-11-11 ENCOUNTER — Other Ambulatory Visit: Payer: Self-pay

## 2015-11-11 MED ORDER — METFORMIN HCL 1000 MG PO TABS: 1000.0000 mg | ORAL_TABLET | Freq: Every day | ORAL | 3 refills | Status: DC

## 2015-11-11 MED ORDER — METOPROLOL TARTRATE 50 MG PO TABS: ORAL_TABLET | ORAL | 3 refills | Status: DC

## 2015-11-11 NOTE — Telephone Encounter (Signed)
Refill request was received from [facility] for metoprolol 50 mg tablets and metformin 1000 mg tablets.  Rx were sent electronically.

## 2015-11-19 ENCOUNTER — Other Ambulatory Visit: Payer: Medicare Other

## 2015-11-19 ENCOUNTER — Other Ambulatory Visit: Payer: Self-pay

## 2015-11-19 DIAGNOSIS — I10 Essential (primary) hypertension: Secondary | ICD-10-CM

## 2015-11-19 DIAGNOSIS — IMO0002 Reserved for concepts with insufficient information to code with codable children: Secondary | ICD-10-CM

## 2015-11-19 DIAGNOSIS — E1165 Type 2 diabetes mellitus with hyperglycemia: Principal | ICD-10-CM

## 2015-11-19 DIAGNOSIS — E785 Hyperlipidemia, unspecified: Secondary | ICD-10-CM | POA: Diagnosis not present

## 2015-11-19 DIAGNOSIS — E1122 Type 2 diabetes mellitus with diabetic chronic kidney disease: Secondary | ICD-10-CM | POA: Diagnosis not present

## 2015-11-19 DIAGNOSIS — M545 Low back pain, unspecified: Secondary | ICD-10-CM

## 2015-11-19 DIAGNOSIS — N182 Chronic kidney disease, stage 2 (mild): Secondary | ICD-10-CM | POA: Diagnosis not present

## 2015-11-19 LAB — LIPID PANEL
CHOLESTEROL: 203 mg/dL — AB (ref 125–200)
HDL: 51 mg/dL (ref >=40)
LDL Cholesterol: 125 mg/dL (ref <130)
TRIGLYCERIDES: 134 mg/dL (ref <150)
Total CHOL/HDL Ratio: 4 Ratio (ref <=5.0)
VLDL: 27 mg/dL (ref <30)

## 2015-11-19 LAB — BASIC METABOLIC PANEL
BUN: 32 mg/dL — AB (ref 7–25)
CHLORIDE: 92 mmol/L — AB (ref 98–110)
CO2: 36 mmol/L — ABNORMAL HIGH (ref 20–31)
CREATININE: 1.85 mg/dL — AB (ref 0.70–1.18)
Calcium: 9.9 mg/dL (ref 8.6–10.3)
Glucose, Bld: 168 mg/dL — ABNORMAL HIGH (ref 65–99)
Potassium: 4 mmol/L (ref 3.5–5.3)
Sodium: 138 mmol/L (ref 135–146)

## 2015-11-19 LAB — HEMOGLOBIN A1C
HEMOGLOBIN A1C: 7.7 % — AB (ref <5.7)
MEAN PLASMA GLUCOSE: 174 mg/dL

## 2015-11-19 MED ORDER — HYDROCODONE-ACETAMINOPHEN 10-325 MG PO TABS: 1.0000 | ORAL_TABLET | Freq: Four times a day (QID) | ORAL | 0 refills | Status: DC | PRN

## 2015-11-20 LAB — MICROALBUMIN, URINE: MICROALB UR: 17.2 mg/dL

## 2015-11-23 ENCOUNTER — Encounter: Payer: Medicare Other | Admitting: Internal Medicine

## 2015-12-01 ENCOUNTER — Encounter: Payer: Self-pay | Admitting: Internal Medicine

## 2015-12-01 ENCOUNTER — Ambulatory Visit (INDEPENDENT_AMBULATORY_CARE_PROVIDER_SITE_OTHER): Payer: Medicare Other | Admitting: Internal Medicine

## 2015-12-01 VITALS — BP 152/78 | HR 84 | Temp 98.1°F | Ht 67.0 in | Wt 221.0 lb

## 2015-12-01 DIAGNOSIS — E785 Hyperlipidemia, unspecified: Secondary | ICD-10-CM

## 2015-12-01 DIAGNOSIS — I10 Essential (primary) hypertension: Secondary | ICD-10-CM

## 2015-12-01 DIAGNOSIS — E1165 Type 2 diabetes mellitus with hyperglycemia: Secondary | ICD-10-CM

## 2015-12-01 DIAGNOSIS — E1122 Type 2 diabetes mellitus with diabetic chronic kidney disease: Secondary | ICD-10-CM

## 2015-12-01 DIAGNOSIS — N182 Chronic kidney disease, stage 2 (mild): Secondary | ICD-10-CM

## 2015-12-01 DIAGNOSIS — R609 Edema, unspecified: Secondary | ICD-10-CM | POA: Diagnosis not present

## 2015-12-01 DIAGNOSIS — M109 Gout, unspecified: Secondary | ICD-10-CM | POA: Diagnosis not present

## 2015-12-01 DIAGNOSIS — M25552 Pain in left hip: Secondary | ICD-10-CM

## 2015-12-01 DIAGNOSIS — G8929 Other chronic pain: Secondary | ICD-10-CM

## 2015-12-01 DIAGNOSIS — M25551 Pain in right hip: Secondary | ICD-10-CM | POA: Diagnosis not present

## 2015-12-01 DIAGNOSIS — E669 Obesity, unspecified: Secondary | ICD-10-CM | POA: Diagnosis not present

## 2015-12-01 DIAGNOSIS — M545 Low back pain: Secondary | ICD-10-CM | POA: Diagnosis not present

## 2015-12-01 DIAGNOSIS — I4891 Unspecified atrial fibrillation: Secondary | ICD-10-CM | POA: Diagnosis not present

## 2015-12-01 DIAGNOSIS — IMO0002 Reserved for concepts with insufficient information to code with codable children: Secondary | ICD-10-CM

## 2015-12-01 MED ORDER — APIXABAN 5 MG PO TABS: ORAL_TABLET | ORAL | 4 refills | Status: DC

## 2015-12-01 MED ORDER — APIXABAN 5 MG PO TABS: ORAL_TABLET | ORAL | 0 refills | Status: DC

## 2015-12-01 NOTE — Progress Notes (Signed)
Facility  [facility]    Place of Service:   OFFICE    No Known Allergies  Chief Complaint  Patient presents with  . Annual Exam    Wellness exam  . Medical Management of Chronic Issues    medication management, blood pressure, blood sugar, cholesterol, CKD, A-Fib    HPI:  Essential hypertension, malignant - mild elevation in the SBP  Chronic kidney disease, stage II (mild) - mild rise in the BUN and the creatinine since last checked  Uncontrolled type 2 diabetes mellitus with stage 2 chronic kidney disease, without long-term current use of insulin (HCC) - some improvement in the A1c since last checked. Still not compliant with diet.  Edema, unspecified type - 2+ biedal  Hyperlipidemia - controlled  Chronic midline low back pain without sciatica - chronic and unchanged  Obesity - not complaint with diet  Gout, unspecified - no attacks in many months  Hip pain, bilateral - chronic and unchanged  Atrial fibrillation, unspecified type ([place]) - new diagnosis. There ws evidence of this on his EKG in 2016. No chest pain or palpitations. Chronically fatigued and with little stamina. Has not seen cardiologist and is not anticoagulated.   Medications: Patient's Medications  New Prescriptions   No medications on file  Previous Medications   ALLOPURINOL (ZYLOPRIM) 300 MG TABLET    take 1 tablet by mouth once daily   DOXAZOSIN (CARDURA) 8 MG TABLET    Take one tablet by mouth once daily for blood pressure   EMPAGLIFLOZIN (JARDIANCE) 10 MG TABS TABLET    One each morning to control diabetes   FUROSEMIDE (LASIX) 20 MG TABLET    One each morning to reduce edema   HYDROCODONE-ACETAMINOPHEN (NORCO) 10-325 MG TABLET    Take 1 tablet by mouth every 6 (six) hours as needed (pain).   METFORMIN (GLUCOPHAGE) 1000 MG TABLET    Take 1 tablet (1,000 mg total) by mouth daily with breakfast.   METOPROLOL (LOPRESSOR) 50 MG TABLET    take 1 tablet by mouth twice a day TO CONTROL BP AND HEART RHYTHM     MULTIVITAMIN-IRON-MINERALS-FOLIC ACID (CENTRUM) CHEWABLE TABLET    Chew 1 tablet by mouth daily.   OLMESARTAN-AMLODIPINE-HCTZ (TRIBENZOR) 40-10-25 MG TABS    take 1 tablet by mouth once daily for blood pressure  Modified Medications   No medications on file  Discontinued Medications   No medications on file     Review of Systems  Constitutional: Positive for fatigue. Negative for activity change, appetite change, fever and unexpected weight change.       Gaining weight  HENT: Positive for tinnitus. Negative for congestion, ear pain, hearing loss, rhinorrhea, sore throat, trouble swallowing and voice change.   Eyes: Negative.        Corrective lenses  Respiratory: Negative.  Negative for cough, choking, chest tightness, shortness of breath and wheezing.   Cardiovascular: Positive for palpitations and leg swelling. Negative for chest pain.       History of AF  Gastrointestinal: Negative.  Negative for abdominal distention, abdominal pain, constipation, diarrhea, nausea, rectal pain and vomiting.  Endocrine: Negative for cold intolerance, heat intolerance, polydipsia, polyphagia and polyuria.       Diabetic  Genitourinary: Negative.  Negative for dysuria, frequency, testicular pain and urgency.       Not incontinent  Musculoskeletal: Positive for back pain. Negative for arthralgias, gait problem, myalgias and neck pain.       Hx gout  Skin: Negative.  Negative for color change, pallor and rash.  Allergic/Immunologic: Negative.   Neurological: Negative.  Negative for dizziness, tremors, syncope, speech difficulty, weakness, numbness and headaches.  Hematological: Negative.  Negative for adenopathy. Does not bruise/bleed easily.  Psychiatric/Behavioral: Negative.  Negative for behavioral problems, confusion, decreased concentration, hallucinations and sleep disturbance. The patient is not nervous/anxious.   All other systems reviewed and are negative.   Vitals:   12/01/15 1416  BP:  (!) 152/78  Pulse: 84  Temp: 98.1 F (36.7 C)  TempSrc: Oral  SpO2: 95%  Weight: 221 lb (100.2 kg)  Height: 5' 7"  (1.702 m)   Wt Readings from Last 3 Encounters:  12/01/15 221 lb (100.2 kg)  06/30/15 224 lb (101.6 kg)  03/30/15 241 lb 3.2 oz (109.4 kg)    Body mass index is 34.61 kg/m.  Physical Exam  Constitutional: He is oriented to person, place, and time. He appears well-developed and well-nourished. No distress.  overweight  HENT:  Head: Normocephalic and atraumatic.  Right Ear: External ear normal.  Left Ear: External ear normal.  Nose: Nose normal.  Mouth/Throat: Oropharynx is clear and moist. No oropharyngeal exudate.  Eyes: Conjunctivae and EOM are normal. Pupils are equal, round, and reactive to light.  Neck: Normal range of motion. Neck supple. No JVD present. No tracheal deviation present. No thyromegaly present.  Cardiovascular: Normal rate, regular rhythm, normal heart sounds and intact distal pulses.  Exam reveals no gallop and no friction rub.   No murmur heard. Af. Rate controlled.  Pulmonary/Chest: Effort normal and breath sounds normal. No respiratory distress. He has no wheezes. He has no rales. He exhibits no tenderness.  Abdominal: Soft. Bowel sounds are normal. He exhibits no distension and no mass. There is no tenderness.  Musculoskeletal: Normal range of motion. He exhibits edema. He exhibits no tenderness.  Tender in lower back and hips.  Lymphadenopathy:    He has no cervical adenopathy.  Neurological: He is alert and oriented to person, place, and time. He has normal reflexes. No cranial nerve deficit. Coordination normal.  Skin: No rash noted. No erythema. No pallor.  Psychiatric: He has a normal mood and affect. His behavior is normal. Judgment and thought content normal.     Labs reviewed: Lab Summary Latest Ref Rng & Units 11/19/2015 06/28/2015 03/08/2015 12/07/2014  Hemoglobin 13.0-17.0 g/dL (None) (None) (None) (None)  Hematocrit 39.0-52.0  % (None) (None) (None) (None)  White count - (None) (None) (None) (None)  Platelet count - (None) (None) (None) (None)  Sodium 135 - 146 mmol/L 138 139 138 140  Potassium 3.5 - 5.3 mmol/L 4.0 4.0 4.1 3.7  Calcium 8.6 - 10.3 mg/dL 9.9 9.7 10.0 10.9(H)  Phosphorus - (None) (None) (None) (None)  Creatinine 0.70 - 1.18 mg/dL 1.85(H) 1.52(H) 1.42(H) 1.57(H)  AST - (None) (None) (None) (None)  Alk Phos - (None) (None) (None) (None)  Bilirubin - (None) (None) (None) (None)  Glucose 65 - 99 mg/dL 168(H) 142(H) 296(H) 312(H)  Cholesterol 125 - 200 mg/dL 203(H) (None) (None) (None)  HDL cholesterol >=40 mg/dL 51 (None) 36(L) 36(L)  Triglycerides <150 mg/dL 134 (None) 180(H) 252(H)  LDL Direct - (None) (None) (None) (None)  LDL Calc <130 mg/dL 125 (None) 123(H) 115(H)  Total protein - (None) (None) (None) (None)  Albumin - (None) (None) (None) (None)  Some recent data might be hidden   No results found for: TSH Lab Results  Component Value Date   BUN 32 (H) 11/19/2015   BUN 23 06/28/2015  BUN 20 03/08/2015   Lab Results  Component Value Date   CREATININE 1.85 (H) 11/19/2015   CREATININE 1.52 (H) 06/28/2015   CREATININE 1.42 (H) 03/08/2015   Lab Results  Component Value Date   HGBA1C 7.7 (H) 11/19/2015   HGBA1C 8.0 (H) 06/28/2015   HGBA1C 11.8 (H) 03/08/2015       Assessment/Plan  1. Essential hypertension, malignant Reasonably well controlled - EKG 74-[facility] - Basic metabolic panel; Future  2. Chronic kidney disease, stage II (mild) Slightly worse  - Basic metabolic panel; Future  3. Uncontrolled type 2 diabetes mellitus with stage 2 chronic kidney disease, without long-term current use of insulin ([place]) Emphasized weight loss and dietary compliance - Hemoglobin A1c; Future - Basic metabolic panel; Future  4. Edema, unspecified type chronic  5. Hyperlipidemia controlled  6. Chronic midline low back pain without sciatica unchanged  7. Obesity Follow diet  8.  Gout, unspecified asymptomatic  9. Hip pain, bilateral unchanged  10. Atrial fibrillation, unspecified type ([place]) New problem - apixaban (ELIQUIS) 5 MG TABS tablet; One twice daily to reduce risk of stroke associated with atrial fibrillation  Dispense: 180 tablet; Refill: 4 - apixaban (ELIQUIS) 5 MG TABS tablet; One twice daily to reduce the risk of stroke associated with atrial fibrillation  Dispense: 60 tablet; Refill: 0 -Cardiology referral

## 2015-12-02 ENCOUNTER — Telehealth: Payer: Self-pay | Admitting: *Deleted

## 2015-12-02 NOTE — Telephone Encounter (Signed)
Received Prior Authorization request from [employer] for Eliquis. Called #[phone] and spoke with Kya and was APPROVED through 04/16/2016. [id number]

## 2015-12-17 ENCOUNTER — Other Ambulatory Visit: Payer: Self-pay | Admitting: *Deleted

## 2015-12-17 ENCOUNTER — Encounter: Payer: Self-pay | Admitting: Cardiology

## 2015-12-17 DIAGNOSIS — M545 Low back pain, unspecified: Secondary | ICD-10-CM

## 2015-12-17 MED ORDER — HYDROCODONE-ACETAMINOPHEN 10-325 MG PO TABS: 1.0000 | ORAL_TABLET | Freq: Four times a day (QID) | ORAL | 0 refills | Status: DC | PRN

## 2015-12-17 NOTE — Telephone Encounter (Signed)
Spoke with patient and advised rx ready for pick-up and it will be at the front desk.  

## 2015-12-17 NOTE — Telephone Encounter (Signed)
Patient requested and will pick up 

## 2015-12-21 ENCOUNTER — Institutional Professional Consult (permissible substitution): Payer: Self-pay | Admitting: Cardiology

## 2015-12-21 NOTE — Progress Notes (Deleted)
Electrophysiology Office Note   Date:  12/21/2015   ID:  Douglas Soto, DOB November 19, 1940, MRN [id number]  PCP:  Jeanmarie Hubert, MD  Cardiologist:  *** Primary Electrophysiologist: *** Elim Peale Meredith Leeds, MD    No chief complaint on file.    History of Present Illness: Douglas Soto is a 75 y.o. male who presents today for electrophysiology evaluation.   Hx CKD II, DM2, HTN who was recently found to be in atrial fibrillation by his PCP.  He was placed on Eliquis.   Today, he denies*** symptoms of palpitations, chest pain, shortness of breath, orthopnea, PND, lower extremity edema, claudication, dizziness, presyncope, syncope, bleeding, or neurologic sequela. The patient is tolerating medications without difficulties and is otherwise without complaint today.    Past Medical History:  Diagnosis Date  . Chronic kidney disease, stage II (mild)   . Depression   . Diabetes mellitus without complication ([place])   . Essential hypertension, malignant   . Gout, unspecified   . Hyperlipidemia   . Lumbago   . Obesity, unspecified   . Pain in joint, pelvic region and thigh   . Special screening for malignant neoplasm of prostate    Past Surgical History:  Procedure Laterality Date  . APPENDECTOMY    . CYST EXCISION  1998   back  . 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 allopurinol (ZYLOPRIM) 300 MG tablet take 1 tablet by mouth once daily 30 tablet 5  . apixaban (ELIQUIS) 5 MG TABS tablet One twice daily to reduce risk of stroke associated with atrial fibrillation 180 tablet 4  . apixaban (ELIQUIS) 5 MG TABS tablet One twice daily to reduce the risk of stroke associated with atrial fibrillation 60 tablet 0  . doxazosin (CARDURA) 8 MG tablet Take one tablet by mouth once daily for blood pressure 30 tablet 5  . empagliflozin (JARDIANCE) 10 MG TABS tablet One each morning to control diabetes 30  tablet 5  . furosemide (LASIX) 20 MG tablet One each morning to reduce edema 30 tablet 5  . HYDROcodone-acetaminophen (NORCO) 10-325 MG tablet Take 1 tablet by mouth every 6 (six) hours as needed (pain). 120 tablet 0  . metFORMIN (GLUCOPHAGE) 1000 MG tablet Take 1 tablet (1,000 mg total) by mouth daily with breakfast. 30 tablet 3  . metoprolol (LOPRESSOR) 50 MG tablet take 1 tablet by mouth twice a day TO CONTROL BP AND HEART RHYTHM 180 tablet 3  . multivitamin-iron-minerals-folic acid (CENTRUM) chewable tablet Chew 1 tablet by mouth daily.    . Olmesartan-Amlodipine-HCTZ (TRIBENZOR) 40-10-25 MG TABS take 1 tablet by mouth once daily for blood pressure 30 tablet 5   No current facility-administered medications for this visit.     Allergies:   Review of patient's allergies indicates no known allergies.   Social History:  The patient  reports that he quit smoking about 43 years ago. His smokeless tobacco use includes Chew. He reports that he drinks about 4.2 oz of alcohol per week . He reports that he does not use drugs.   Family History:  The patient's family history includes Diabetes in his brother and brother; Heart Problems in his father and mother; Heart disease in his brother; Pneumonia in his maternal grandfather.    ROS:  Please see the history of present illness.   Otherwise, review of systems is positive for ***.   All other systems are reviewed  and negative.    PHYSICAL EXAM: VS:  There were no vitals taken for this visit. , BMI There is no height or weight on file to calculate BMI. GEN: Well nourished, well developed, in no acute distress  HEENT: normal  Neck: no JVD, carotid bruits, or masses Cardiac: ***RRR; no murmurs, rubs, or gallops,no edema  Respiratory:  clear to auscultation bilaterally, normal work of breathing GI: soft, nontender, nondistended, + BS MS: no deformity or atrophy  Skin: warm and dry Neuro:  Strength and sensation are intact Psych: euthymic mood, full  affect  EKG:  EKG {ACTION; IS/IS [id number] ordered today. Personal review of the ekg ordered shows ***  Recent Labs: 11/19/2015: BUN 32; Creat 1.85; Potassium 4.0; Sodium 138    Lipid Panel     Component Value Date/Time   CHOL 203 (H) 11/19/2015 0803   CHOL 195 03/08/2015 1116   TRIG 134 11/19/2015 0803   HDL 51 11/19/2015 0803   HDL 36 (L) 03/08/2015 1116   CHOLHDL 4.0 11/19/2015 0803   VLDL 27 11/19/2015 0803   LDLCALC 125 11/19/2015 0803   LDLCALC 123 (H) 03/08/2015 1116     Wt Readings from Last 3 Encounters:  12/01/15 221 lb (100.2 kg)  06/30/15 224 lb (101.6 kg)  03/30/15 241 lb 3.2 oz (109.4 kg)      Other studies Reviewed: Additional studies/ records that were reviewed today include: Epic notes   ASSESSMENT AND PLAN:  1.  Persistent atrial fibrillation:  This patients CHA2DS2-VASc Score and unadjusted Ischemic Stroke Rate (% per year) is equal to 3.2 % stroke rate/year from a score of 3  Above score calculated as 1 point each if present [CHF, HTN, DM, Vascular=MI/PAD/Aortic Plaque, Age if 65-74, or Male] Above score calculated as 2 points each if present [Age > 75, or Stroke/TIA/TE]  2. Hypertension:   Current medicines are reviewed at length with the patient today.   The patient {ACTIONS; HAS/DOES NOT HAVE:19233} concerns regarding his medicines.  The following changes were made today:  {NONE DEFAULTED:18576::"none"}  Labs/ tests ordered today include: *** No orders of the defined types were placed in this encounter.    Disposition:   FU with *** {gen number [id number] {Days to years:10300}  Signed, Rafaela Dinius Meredith Leeds, MD  12/21/2015 3:19 PM     [place] [address] [phone] (office) [phone] (fax)

## 2015-12-22 ENCOUNTER — Encounter: Payer: Self-pay | Admitting: Cardiology

## 2016-01-14 ENCOUNTER — Other Ambulatory Visit: Payer: Self-pay | Admitting: *Deleted

## 2016-01-14 DIAGNOSIS — M545 Low back pain, unspecified: Secondary | ICD-10-CM

## 2016-01-14 MED ORDER — HYDROCODONE-ACETAMINOPHEN 10-325 MG PO TABS: 1.0000 | ORAL_TABLET | Freq: Four times a day (QID) | ORAL | 0 refills | Status: DC | PRN

## 2016-01-14 NOTE — Telephone Encounter (Signed)
Patient requested and will pick up 

## 2016-02-08 ENCOUNTER — Other Ambulatory Visit: Payer: Self-pay | Admitting: Internal Medicine

## 2016-02-15 ENCOUNTER — Telehealth: Payer: Self-pay

## 2016-02-15 ENCOUNTER — Other Ambulatory Visit: Payer: Self-pay | Admitting: *Deleted

## 2016-02-15 MED ORDER — HYDROCODONE-ACETAMINOPHEN 10-325 MG PO TABS: 1.0000 | ORAL_TABLET | Freq: Four times a day (QID) | ORAL | 0 refills | Status: DC | PRN

## 2016-02-15 NOTE — Telephone Encounter (Signed)
Patient requested and will pick up 

## 2016-02-15 NOTE — Telephone Encounter (Signed)
I called patient to let him know that he has a prescription ready to pick up at the office. Prescription was placed in filing cabinet at front desk.  

## 2016-03-12 ENCOUNTER — Other Ambulatory Visit: Payer: Self-pay | Admitting: Internal Medicine

## 2016-03-16 ENCOUNTER — Other Ambulatory Visit: Payer: Self-pay

## 2016-03-16 MED ORDER — HYDROCODONE-ACETAMINOPHEN 10-325 MG PO TABS: 1.0000 | ORAL_TABLET | Freq: Four times a day (QID) | ORAL | 0 refills | Status: DC | PRN

## 2016-03-25 NOTE — Progress Notes (Signed)
Cardiology Office Note    Date:  03/27/2016   ID:  ALVESTER KNOEDLER, DOB 1941/01/22, MRN [id number]  PCP:  Jeanmarie Hubert, MD  Cardiologist:  Oralia Criger [place], MD    History of Present Illness:  Douglas Soto is a 75 y.o. male referred by Dr. Nyoka Cowden for evaluation of atrial fibrillation. He has a history of DM, HTN, and CKD stage 2. He also has hyperlipidemia.  He was noted on his last physical to be in Afib. This was also seen on Ecg in July 2015. He states sometimes he feels his heart beat irregular but otherwise denies any chest pain, dizziness, SOB. Mild edema at times. No syncope. No prior history of TIA or CVA. Overall feels he is doing very well. No other prior cardiac history or evaluation.   Past Medical History:  Diagnosis Date  . Chronic kidney disease, stage II (mild)   . Depression   . Diabetes mellitus without complication ([place])   . Essential hypertension, malignant   . Gout, unspecified   . Hyperlipidemia   . Lumbago   . Obesity, unspecified   . Pain in joint, pelvic region and thigh   . Special screening for malignant neoplasm of prostate     Past Surgical History:  Procedure Laterality Date  . APPENDECTOMY    . CYST EXCISION  1998   back  . 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 allopurinol (ZYLOPRIM) 300 MG tablet take 1 tablet by mouth once daily 30 tablet 5  . apixaban (ELIQUIS) 5 MG TABS tablet One twice daily to reduce risk of stroke associated with atrial fibrillation 180 tablet 4  . apixaban (ELIQUIS) 5 MG TABS tablet One twice daily to reduce the risk of stroke associated with atrial fibrillation 60 tablet 0  . doxazosin (CARDURA) 8 MG tablet take 1 tablet by mouth once daily for blood pressure 30 tablet 5  . empagliflozin (JARDIANCE) 10 MG TABS tablet One each morning to control diabetes 30 tablet 5  . furosemide (LASIX) 20 MG tablet One  each morning to reduce edema 30 tablet 5  . HYDROcodone-acetaminophen (NORCO) 10-325 MG tablet Take 1 tablet by mouth every 6 (six) hours as needed (pain). 120 tablet 0  . metFORMIN (GLUCOPHAGE) 1000 MG tablet take 1 tablet by mouth once daily WITH BREAKFAST 30 tablet 3  . metoprolol (LOPRESSOR) 50 MG tablet take 1 tablet by mouth twice a day TO CONTROL BP AND HEART RHYTHM 180 tablet 3  . multivitamin-iron-minerals-folic acid (CENTRUM) chewable tablet Chew 1 tablet by mouth daily.    . Olmesartan-Amlodipine-HCTZ (TRIBENZOR) 40-10-25 MG TABS take 1 tablet by mouth once daily for blood pressure 30 tablet 5   No facility-administered medications prior to visit.      Allergies:   Patient has no known allergies.   Social History   Social History  . Marital status: Widowed    Spouse name: N/A  . Number of children: N/A  . Years of education: N/A   Social History Main Topics  . Smoking status: Former Smoker    Quit date: 04/17/1972  . Smokeless tobacco: Current User    Types: Chew  . Alcohol use 4.2 oz/week    7 Cans of beer per week  . Drug use: No  . Sexual activity: Not Asked   Other Topics Concern  . None   Social History Narrative  Widow   Former smoker stopped 1974, chews tobacco   Alcohol beer on weekends   Exercise  Walks dog           Family History:  The patient's family history includes Diabetes in his brother and brother; Heart Problems in his father and mother; Heart disease in his brother; Pneumonia in his maternal grandfather.   ROS:   Please see the history of present illness.    ROS All other systems reviewed and are negative.   PHYSICAL EXAM:   VS:  BP 124/70   Pulse 73   Ht 5\' 8"  (1.727 m)   Wt 235 lb 3.2 oz (106.7 kg)   BMI 35.76 kg/m    GEN: Well nourished, overweight, in no acute distress  HEENT: normal  Neck: no JVD, carotid bruits, or masses Cardiac: IRRR; no murmurs, rubs, or gallops,no edema  Respiratory:  clear to auscultation  bilaterally, normal work of breathing GI: soft, nontender, nondistended, + BS MS: no deformity or atrophy  Skin: warm and dry, no rash Neuro:  Alert and Oriented x 3, Strength and sensation are intact Psych: euthymic mood, full affect  Wt Readings from Last 3 Encounters:  03/27/16 235 lb 3.2 oz (106.7 kg)  12/01/15 221 lb (100.2 kg)  06/30/15 224 lb (101.6 kg)      Studies/Labs Reviewed:   EKG:  EKG is ordered today.  The ekg ordered today demonstrates Atrial fibrillation with rate 73. Otherwise normal.  Recent Labs: 11/19/2015: BUN 32; Creat 1.85; Potassium 4.0; Sodium 138   Lipid Panel    Component Value Date/Time   CHOL 203 (H) 11/19/2015 0803   CHOL 195 03/08/2015 1116   TRIG 134 11/19/2015 0803   HDL 51 11/19/2015 0803   HDL 36 (L) 03/08/2015 1116   CHOLHDL 4.0 11/19/2015 0803   VLDL 27 11/19/2015 0803   LDLCALC 125 11/19/2015 0803   LDLCALC 123 (H) 03/08/2015 1116    Additional studies/ records that were reviewed today include:  none    ASSESSMENT:    1. Persistent atrial fibrillation ([place])   2. Essential hypertension, malignant   3. Pure hypercholesterolemia   4. Uncontrolled type 2 diabetes mellitus with stage 2 chronic kidney disease, without long-term current use of insulin (HCC)      PLAN:  In order of problems listed above:  1. Atrial fibrillation is probably of longstanding duration. Rate is well controlled on metoprolol. [place] Vasc score of 3. Now on Eliquis for anticoagulation. To completer work up will check TSH and CBC. Will check Echo. Would recommend long term strategy of rate control and anticoagulation since he is asymptomatic. 2. BP is currently well controlled.     Medication Adjustments/Labs and Tests Ordered: Current medicines are reviewed at length with the patient today.  Concerns regarding medicines are outlined above.  Medication changes, Labs and Tests ordered today are listed in the Patient Instructions below. Patient Instructions   We will  Check your blood counts and thyroid test  We will schedule you for an Echocardiogram  I will see you in 3 months.    Signed, Ankur Snowdon [place], MD  03/27/2016 11:04 AM    [place] [address] [place], [place], [address] [phone]

## 2016-03-27 ENCOUNTER — Ambulatory Visit (INDEPENDENT_AMBULATORY_CARE_PROVIDER_SITE_OTHER): Payer: Medicare Other | Admitting: Cardiology

## 2016-03-27 ENCOUNTER — Encounter: Payer: Self-pay | Admitting: Cardiology

## 2016-03-27 VITALS — BP 124/70 | HR 73 | Ht 68.0 in | Wt 235.2 lb

## 2016-03-27 DIAGNOSIS — I4819 Other persistent atrial fibrillation: Secondary | ICD-10-CM

## 2016-03-27 DIAGNOSIS — I481 Persistent atrial fibrillation: Secondary | ICD-10-CM | POA: Diagnosis not present

## 2016-03-27 DIAGNOSIS — N182 Chronic kidney disease, stage 2 (mild): Secondary | ICD-10-CM

## 2016-03-27 DIAGNOSIS — IMO0002 Reserved for concepts with insufficient information to code with codable children: Secondary | ICD-10-CM

## 2016-03-27 DIAGNOSIS — E1122 Type 2 diabetes mellitus with diabetic chronic kidney disease: Secondary | ICD-10-CM

## 2016-03-27 DIAGNOSIS — I10 Essential (primary) hypertension: Secondary | ICD-10-CM | POA: Diagnosis not present

## 2016-03-27 DIAGNOSIS — E7800 Pure hypercholesterolemia, unspecified: Secondary | ICD-10-CM

## 2016-03-27 DIAGNOSIS — E1165 Type 2 diabetes mellitus with hyperglycemia: Secondary | ICD-10-CM | POA: Diagnosis not present

## 2016-03-27 LAB — CBC WITH DIFFERENTIAL/PLATELET
BASOS ABS: 0 {cells}/uL (ref 0–200)
Basophils Relative: 0 %
EOS ABS: 182 {cells}/uL (ref 15–500)
Eosinophils Relative: 2 %
HEMATOCRIT: 45.7 % (ref 38.5–50.0)
HEMOGLOBIN: 15 g/dL (ref 13.2–17.1)
LYMPHS ABS: 1365 {cells}/uL (ref 850–3900)
LYMPHS PCT: 15 %
MCH: 30 pg (ref 27.0–33.0)
MCHC: 32.8 g/dL (ref 32.0–36.0)
MCV: 91.4 fL (ref 80.0–100.0)
MONO ABS: 819 {cells}/uL (ref 200–950)
MPV: 9.7 fL (ref 7.5–12.5)
Monocytes Relative: 9 %
NEUTROS PCT: 74 %
Neutro Abs: 6734 cells/uL (ref 1500–7800)
Platelets: 232 10*3/uL (ref 140–400)
RBC: 5 MIL/uL (ref 4.20–5.80)
RDW: 13.5 % (ref 11.0–15.0)
WBC: 9.1 10*3/uL (ref 3.8–10.8)

## 2016-03-27 LAB — TSH: TSH: 1.63 mIU/L (ref 0.40–4.50)

## 2016-03-27 NOTE — Patient Instructions (Signed)
We will  Check your blood counts and thyroid test  We will schedule you for an Echocardiogram  I will see you in 3 months.

## 2016-03-31 ENCOUNTER — Other Ambulatory Visit: Payer: Medicare Other

## 2016-03-31 DIAGNOSIS — E1165 Type 2 diabetes mellitus with hyperglycemia: Principal | ICD-10-CM

## 2016-03-31 DIAGNOSIS — N182 Chronic kidney disease, stage 2 (mild): Secondary | ICD-10-CM | POA: Diagnosis not present

## 2016-03-31 DIAGNOSIS — I10 Essential (primary) hypertension: Secondary | ICD-10-CM | POA: Diagnosis not present

## 2016-03-31 DIAGNOSIS — E1122 Type 2 diabetes mellitus with diabetic chronic kidney disease: Secondary | ICD-10-CM | POA: Diagnosis not present

## 2016-03-31 DIAGNOSIS — IMO0002 Reserved for concepts with insufficient information to code with codable children: Secondary | ICD-10-CM

## 2016-03-31 LAB — BASIC METABOLIC PANEL
BUN: 28 mg/dL — AB (ref 7–25)
CALCIUM: 9.8 mg/dL (ref 8.6–10.3)
CO2: 33 mmol/L — ABNORMAL HIGH (ref 20–31)
Chloride: 96 mmol/L — ABNORMAL LOW (ref 98–110)
Creat: 1.55 mg/dL — ABNORMAL HIGH (ref 0.70–1.18)
GLUCOSE: 220 mg/dL — AB (ref 65–99)
Potassium: 3.4 mmol/L — ABNORMAL LOW (ref 3.5–5.3)
SODIUM: 140 mmol/L (ref 135–146)

## 2016-03-31 LAB — HEMOGLOBIN A1C
Hgb A1c MFr Bld: 8.6 % — ABNORMAL HIGH (ref <5.7)
Mean Plasma Glucose: 200 mg/dL

## 2016-04-04 ENCOUNTER — Encounter: Payer: Self-pay | Admitting: Internal Medicine

## 2016-04-04 ENCOUNTER — Ambulatory Visit (INDEPENDENT_AMBULATORY_CARE_PROVIDER_SITE_OTHER): Payer: Medicare Other | Admitting: Internal Medicine

## 2016-04-04 VITALS — BP 150/84 | HR 85 | Temp 97.5°F | Ht 68.0 in | Wt 238.0 lb

## 2016-04-04 DIAGNOSIS — M545 Low back pain, unspecified: Secondary | ICD-10-CM

## 2016-04-04 DIAGNOSIS — E7800 Pure hypercholesterolemia, unspecified: Secondary | ICD-10-CM | POA: Diagnosis not present

## 2016-04-04 DIAGNOSIS — E1165 Type 2 diabetes mellitus with hyperglycemia: Secondary | ICD-10-CM

## 2016-04-04 DIAGNOSIS — E1122 Type 2 diabetes mellitus with diabetic chronic kidney disease: Secondary | ICD-10-CM

## 2016-04-04 DIAGNOSIS — Z6836 Body mass index (BMI) 36.0-36.9, adult: Secondary | ICD-10-CM | POA: Diagnosis not present

## 2016-04-04 DIAGNOSIS — I481 Persistent atrial fibrillation: Secondary | ICD-10-CM

## 2016-04-04 DIAGNOSIS — I10 Essential (primary) hypertension: Secondary | ICD-10-CM | POA: Diagnosis not present

## 2016-04-04 DIAGNOSIS — G8929 Other chronic pain: Secondary | ICD-10-CM

## 2016-04-04 DIAGNOSIS — IMO0001 Reserved for inherently not codable concepts without codable children: Secondary | ICD-10-CM

## 2016-04-04 DIAGNOSIS — N182 Chronic kidney disease, stage 2 (mild): Secondary | ICD-10-CM

## 2016-04-04 DIAGNOSIS — IMO0002 Reserved for concepts with insufficient information to code with codable children: Secondary | ICD-10-CM

## 2016-04-04 DIAGNOSIS — E6609 Other obesity due to excess calories: Secondary | ICD-10-CM | POA: Diagnosis not present

## 2016-04-04 DIAGNOSIS — I4819 Other persistent atrial fibrillation: Secondary | ICD-10-CM

## 2016-04-04 NOTE — Progress Notes (Signed)
Facility  [facility]    Place of Service:   OFFICE    No Known Allergies  Chief Complaint  Patient presents with  . Medical Management of Chronic Issues    4 month medication management blood pressure, blood sugar, CKD, cholesterol , review labs.    HPI:  Uncontrolled type 2 diabetes mellitus with stage 2 chronic kidney disease, without long-term current use of insulin (HCC) - noncompliant with diet  Chronic kidney disease, stage II (mild) - unchanged  Persistent atrial fibrillation ([place]) - scheduled for echo in jan 2018. Seeing cardiologist  Essential hypertension, malignant - mild elevation in SBP today  Pure hypercholesterolemia - needs follow up  Class 2 obesity due to excess calories with serious comorbidity and body mass index (BMI) of 36.0 to 36.9 in adult - noncompliant with diet  Chronic midline low back pain without sciatica - may be getting worse, but he does not want to see specialist yet.    Medications: Patient's Medications  New Prescriptions   No medications on file  Previous Medications   ALLOPURINOL (ZYLOPRIM) 300 MG TABLET    take 1 tablet by mouth once daily   APIXABAN (ELIQUIS) 5 MG TABS TABLET    One twice daily to reduce risk of stroke associated with atrial fibrillation   APIXABAN (ELIQUIS) 5 MG TABS TABLET    One twice daily to reduce the risk of stroke associated with atrial fibrillation   DOXAZOSIN (CARDURA) 8 MG TABLET    take 1 tablet by mouth once daily for blood pressure   EMPAGLIFLOZIN (JARDIANCE) 10 MG TABS TABLET    One each morning to control diabetes   FUROSEMIDE (LASIX) 20 MG TABLET    One each morning to reduce edema   HYDROCODONE-ACETAMINOPHEN (NORCO) 10-325 MG TABLET    Take 1 tablet by mouth every 6 (six) hours as needed (pain).   METFORMIN (GLUCOPHAGE) 1000 MG TABLET    take 1 tablet by mouth once daily WITH BREAKFAST   METOPROLOL (LOPRESSOR) 50 MG TABLET    take 1 tablet by mouth twice a day TO CONTROL BP AND HEART RHYTHM   MULTIVITAMIN-IRON-MINERALS-FOLIC ACID (CENTRUM) CHEWABLE TABLET    Chew 1 tablet by mouth daily.   OLMESARTAN-AMLODIPINE-HCTZ (TRIBENZOR) 40-10-25 MG TABS    take 1 tablet by mouth once daily for blood pressure  Modified Medications   No medications on file  Discontinued Medications   No medications on file    Review of Systems  Constitutional: Positive for fatigue. Negative for activity change, appetite change, fever and unexpected weight change.       Gaining weight  HENT: Positive for tinnitus. Negative for congestion, ear pain, hearing loss, rhinorrhea, sore throat, trouble swallowing and voice change.   Eyes: Negative.        Corrective lenses  Respiratory: Negative.  Negative for cough, choking, chest tightness, shortness of breath and wheezing.   Cardiovascular: Positive for palpitations and leg swelling. Negative for chest pain.       History of AF  Gastrointestinal: Negative.  Negative for abdominal distention, abdominal pain, constipation, diarrhea, nausea, rectal pain and vomiting.  Endocrine: Negative for cold intolerance, heat intolerance, polydipsia, polyphagia and polyuria.       Diabetic  Genitourinary: Negative.  Negative for dysuria, frequency, testicular pain and urgency.       Not incontinent  Musculoskeletal: Positive for back pain. Negative for arthralgias, gait problem, myalgias and neck pain.       Hx gout  Skin:  Negative.  Negative for color change, pallor and rash.  Allergic/Immunologic: Negative.   Neurological: Negative.  Negative for dizziness, tremors, syncope, speech difficulty, weakness, numbness and headaches.  Hematological: Negative.  Negative for adenopathy. Does not bruise/bleed easily.  Psychiatric/Behavioral: Negative.  Negative for behavioral problems, confusion, decreased concentration, hallucinations and sleep disturbance. The patient is not nervous/anxious.   All other systems reviewed and are negative.   Vitals:   04/04/16 1359  BP: (!)  150/84  Pulse: 85  Temp: 97.5 F (36.4 C)  SpO2: 93%  Weight: 238 lb (108 kg)  Height: _0  (1.727 m)   Body mass index is 36.19 kg/m. Wt Readings from Last 3 Encounters:  04/04/16 238 lb (108 kg)  03/27/16 235 lb 3.2 oz (106.7 kg)  12/01/15 221 lb (100.2 kg)      Physical Exam  Constitutional: He is oriented to person, place, and time. He appears well-developed and well-nourished. No distress.  overweight  HENT:  Head: Normocephalic and atraumatic.  Right Ear: External ear normal.  Left Ear: External ear normal.  Nose: Nose normal.  Mouth/Throat: Oropharynx is clear and moist. No oropharyngeal exudate.  Eyes: Conjunctivae and EOM are normal. Pupils are equal, round, and reactive to light.  Neck: Normal range of motion. Neck supple. No JVD present. No tracheal deviation present. No thyromegaly present.  Cardiovascular: Normal rate, regular rhythm, normal heart sounds and intact distal pulses.  Exam reveals no gallop and no friction rub.   No murmur heard. Af. Rate controlled.  Pulmonary/Chest: Effort normal and breath sounds normal. No respiratory distress. He has no wheezes. He has no rales. He exhibits no tenderness.  Abdominal: Soft. Bowel sounds are normal. He exhibits no distension and no mass. There is no tenderness.  Musculoskeletal: Normal range of motion. He exhibits edema. He exhibits no tenderness.  Tender in lower back and hips.  Lymphadenopathy:    He has no cervical adenopathy.  Neurological: He is alert and oriented to person, place, and time. He has normal reflexes. No cranial nerve deficit. Coordination normal.  Skin: No rash noted. No erythema. No pallor.  Psychiatric: He has a normal mood and affect. His behavior is normal. Judgment and thought content normal.    Labs reviewed: Lab Summary Latest Ref Rng & Units 03/31/2016 03/27/2016 11/19/2015 06/28/2015 03/08/2015  Hemoglobin 13.2 - 17.1 g/dL (None) 15.0 (None) (None) (None)  Hematocrit 38.5 - 50.0 %  (None) 45.7 (None) (None) (None)  White count 3.8 - 10.8 K/uL (None) 9.1 (None) (None) (None)  Platelet count 140 - 400 K/uL (None) 232 (None) (None) (None)  Sodium 135 - 146 mmol/L 140 (None) 138 139 138  Potassium 3.5 - 5.3 mmol/L 3.4(L) (None) 4.0 4.0 4.1  Calcium 8.6 - 10.3 mg/dL 9.8 (None) 9.9 9.7 10.0  Phosphorus - (None) (None) (None) (None) (None)  Creatinine 0.70 - 1.18 mg/dL 1.55(H) (None) 1.85(H) 1.52(H) 1.42(H)  AST - (None) (None) (None) (None) (None)  Alk Phos - (None) (None) (None) (None) (None)  Bilirubin - (None) (None) (None) (None) (None)  Glucose 65 - 99 mg/dL 220(H) (None) 168(H) 142(H) 296(H)  Cholesterol 125 - 200 mg/dL (None) (None) 203(H) (None) (None)  HDL cholesterol >=40 mg/dL (None) (None) 51 (None) 36(L)  Triglycerides <150 mg/dL (None) (None) 134 (None) 180(H)  LDL Direct - (None) (None) (None) (None) (None)  LDL Calc <130 mg/dL (None) (None) 125 (None) 123(H)  Total protein - (None) (None) (None) (None) (None)  Albumin - (None) (None) (None) (None) (None)  Some  recent data might be hidden   Lab Results  Component Value Date   TSH 1.63 03/27/2016   Lab Results  Component Value Date   BUN 28 (H) 03/31/2016   BUN 32 (H) 11/19/2015   BUN 23 06/28/2015   Lab Results  Component Value Date   HGBA1C 8.6 (H) 03/31/2016   HGBA1C 7.7 (H) 11/19/2015   HGBA1C 8.0 (H) 06/28/2015    Assessment/Plan  1. Uncontrolled type 2 diabetes mellitus with stage 2 chronic kidney disease, without long-term current use of insulin (HCC) Follow diet - Hemoglobin A1c; Future - Basic metabolic panel; Future - Microalbumin, urine; Future  2. Chronic kidney disease, stage II (mild) - Basic metabolic panel; Future  3. Persistent atrial fibrillation ([place]) Follow up with cardiologist  4. Essential hypertension, malignant Continue current meds - Basic metabolic panel; Future  5. Pure hypercholesterolemia - Lipid panel; Future  6. Class 2 obesity due to excess  calories with serious comorbidity and body mass index (BMI) of 36.0 to 36.9 in adult Follow diet  7. Chronic midline low back pain without sciatica The current medical regimen is effective;  continue present plan and medications.

## 2016-04-11 ENCOUNTER — Other Ambulatory Visit: Payer: Self-pay | Admitting: Internal Medicine

## 2016-04-11 DIAGNOSIS — R609 Edema, unspecified: Secondary | ICD-10-CM

## 2016-04-18 ENCOUNTER — Other Ambulatory Visit: Payer: Self-pay

## 2016-04-18 ENCOUNTER — Telehealth: Payer: Self-pay

## 2016-04-18 ENCOUNTER — Ambulatory Visit (HOSPITAL_COMMUNITY): Payer: Medicare Other | Attending: Cardiovascular Disease

## 2016-04-18 ENCOUNTER — Other Ambulatory Visit: Payer: Self-pay | Admitting: *Deleted

## 2016-04-18 DIAGNOSIS — I481 Persistent atrial fibrillation: Secondary | ICD-10-CM | POA: Insufficient documentation

## 2016-04-18 DIAGNOSIS — I517 Cardiomegaly: Secondary | ICD-10-CM | POA: Diagnosis not present

## 2016-04-18 DIAGNOSIS — E1165 Type 2 diabetes mellitus with hyperglycemia: Secondary | ICD-10-CM | POA: Insufficient documentation

## 2016-04-18 DIAGNOSIS — N182 Chronic kidney disease, stage 2 (mild): Secondary | ICD-10-CM

## 2016-04-18 DIAGNOSIS — I4819 Other persistent atrial fibrillation: Secondary | ICD-10-CM

## 2016-04-18 DIAGNOSIS — E1122 Type 2 diabetes mellitus with diabetic chronic kidney disease: Secondary | ICD-10-CM | POA: Insufficient documentation

## 2016-04-18 DIAGNOSIS — E7800 Pure hypercholesterolemia, unspecified: Secondary | ICD-10-CM

## 2016-04-18 DIAGNOSIS — IMO0002 Reserved for concepts with insufficient information to code with codable children: Secondary | ICD-10-CM

## 2016-04-18 DIAGNOSIS — I10 Essential (primary) hypertension: Secondary | ICD-10-CM

## 2016-04-18 MED ORDER — HYDROCODONE-ACETAMINOPHEN 10-325 MG PO TABS: 1.0000 | ORAL_TABLET | Freq: Four times a day (QID) | ORAL | 0 refills | Status: DC | PRN

## 2016-04-18 NOTE — Telephone Encounter (Signed)
Patient requested and will pick up 

## 2016-04-18 NOTE — Telephone Encounter (Signed)
I spoke with patient to let him know that there is a prescription ready to be picked up at the office. Prescription for hydrocodone/APAP 10-325 mg tablets #120.  Prescription was placed in filing cabinet at front desk.

## 2016-05-11 ENCOUNTER — Other Ambulatory Visit: Payer: Self-pay | Admitting: Internal Medicine

## 2016-05-18 ENCOUNTER — Other Ambulatory Visit: Payer: Self-pay | Admitting: Internal Medicine

## 2016-05-18 ENCOUNTER — Telehealth: Payer: Self-pay | Admitting: Internal Medicine

## 2016-05-18 DIAGNOSIS — G8929 Other chronic pain: Secondary | ICD-10-CM

## 2016-05-18 DIAGNOSIS — M109 Gout, unspecified: Secondary | ICD-10-CM

## 2016-05-18 DIAGNOSIS — M545 Low back pain: Principal | ICD-10-CM

## 2016-05-18 MED ORDER — NAPROXEN 500 MG PO TABS: ORAL_TABLET | ORAL | 5 refills | Status: DC

## 2016-05-18 MED ORDER — HYDROCODONE-ACETAMINOPHEN 10-325 MG PO TABS: 1.0000 | ORAL_TABLET | Freq: Four times a day (QID) | ORAL | 0 refills | Status: DC | PRN

## 2016-05-18 NOTE — Telephone Encounter (Signed)
Patient is requesting refill of Hydrocodone/ APAP. Also states that he is having a painful gout attack in the foot. Says he has been taking the allopurinol regularly. Says the hydocodone does not work for the gout pains.  I refilled HC/ APAP and wrote Rx for Naprosyn

## 2016-06-15 ENCOUNTER — Other Ambulatory Visit: Payer: Self-pay

## 2016-06-15 DIAGNOSIS — M545 Low back pain, unspecified: Secondary | ICD-10-CM

## 2016-06-15 DIAGNOSIS — G8929 Other chronic pain: Secondary | ICD-10-CM

## 2016-06-15 MED ORDER — HYDROCODONE-ACETAMINOPHEN 10-325 MG PO TABS: 1.0000 | ORAL_TABLET | Freq: Four times a day (QID) | ORAL | 0 refills | Status: DC | PRN

## 2016-06-16 ENCOUNTER — Other Ambulatory Visit: Payer: Self-pay | Admitting: *Deleted

## 2016-06-16 DIAGNOSIS — G8929 Other chronic pain: Secondary | ICD-10-CM

## 2016-06-16 DIAGNOSIS — M545 Low back pain: Principal | ICD-10-CM

## 2016-06-16 MED ORDER — HYDROCODONE-ACETAMINOPHEN 10-325 MG PO TABS: 1.0000 | ORAL_TABLET | Freq: Four times a day (QID) | ORAL | 0 refills | Status: DC | PRN

## 2016-06-16 NOTE — Telephone Encounter (Signed)
Patient requested and will pick up 

## 2016-06-16 NOTE — Telephone Encounter (Signed)
Patient informed rx signed and ready for pickup  

## 2016-06-23 NOTE — Progress Notes (Deleted)
Cardiology Office Note    Date:  06/23/2016   ID:  Douglas Soto, DOB 05/05/1940, MRN [id number]  PCP:  Jeanmarie Hubert, MD  Cardiologist:  Maxen Rowland Martinique, MD    History of Present Illness:  Douglas Soto is a 76 y.o. male seen for follow up  of atrial fibrillation. He has a history of DM, HTN, and CKD stage 2. He also has hyperlipidemia.  He was noted in August 2017 to be in Afib. This was also seen on Ecg in July 2015. Confirmed again in Dec. 2017. He states sometimes he feels his heart beat irregular but otherwise denies any chest pain, dizziness, SOB. Mild edema at times. No syncope. No prior history of TIA or CVA. Overall feels he is doing very well. No other prior cardiac history or evaluation.   Past Medical History:  Diagnosis Date  . Chronic kidney disease, stage II (mild)   . Depression   . Diabetes mellitus without complication ([place])   . Essential hypertension, malignant   . Gout, unspecified   . Hyperlipidemia   . Lumbago   . Obesity, unspecified   . Pain in joint, pelvic region and thigh   . Special screening for malignant neoplasm of prostate     Past Surgical History:  Procedure Laterality Date  . APPENDECTOMY    . CYST EXCISION  1998   back  . 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 allopurinol (ZYLOPRIM) 300 MG tablet take 1 tablet by mouth once daily 30 tablet 5  . allopurinol (ZYLOPRIM) 300 MG tablet take 1 tablet by mouth once daily 30 tablet 3  . apixaban (ELIQUIS) 5 MG TABS tablet One twice daily to reduce risk of stroke associated with atrial fibrillation 180 tablet 4  . apixaban (ELIQUIS) 5 MG TABS tablet One twice daily to reduce the risk of stroke associated with atrial fibrillation 60 tablet 0  . doxazosin (CARDURA) 8 MG tablet take 1 tablet by mouth once daily for blood pressure 30 tablet 5  . empagliflozin (JARDIANCE) 10 MG TABS  tablet One each morning to control diabetes 30 tablet 5  . furosemide (LASIX) 20 MG tablet take 1 tablet by mouth every morning to REDUCE EDEMA 30 tablet 5  . HYDROcodone-acetaminophen (NORCO) 10-325 MG tablet Take 1 tablet by mouth every 6 (six) hours as needed (pain). 120 tablet 0  . metFORMIN (GLUCOPHAGE) 1000 MG tablet take 1 tablet by mouth once daily WITH BREAKFAST 30 tablet 3  . metoprolol (LOPRESSOR) 50 MG tablet take 1 tablet by mouth twice a day TO CONTROL BP AND HEART RHYTHM 180 tablet 3  . multivitamin-iron-minerals-folic acid (CENTRUM) chewable tablet Chew 1 tablet by mouth daily.    . naproxen (NAPROSYN) 500 MG tablet Take one tablet twice daily to help gout pains 30 tablet 5  . Olmesartan-Amlodipine-HCTZ 40-10-25 MG TABS take 1 tablet by mouth once daily for blood pressure 30 tablet 5   No facility-administered medications prior to visit.      Allergies:   Patient has no known allergies.   Social History   Social History  . Marital status: Widowed    Spouse name: N/A  . Number of children: N/A  . Years of education: N/A   Social History Main Topics  . Smoking status: Former Smoker    Quit date: 04/17/1972  . Smokeless tobacco: Current User  Types: Chew  . Alcohol use 4.2 oz/week    7 Cans of beer per week  . Drug use: No  . Sexual activity: Not on file   Other Topics Concern  . Not on file   Social History Narrative   Widow   Former smoker stopped 1974, chews tobacco   Alcohol beer on weekends   Exercise  Walks dog           Family History:  The patient's family history includes Diabetes in his brother and brother; Heart Problems in his father and mother; Heart disease in his brother; Pneumonia in his maternal grandfather.   ROS:   Please see the history of present illness.    ROS All other systems reviewed and are negative.   PHYSICAL EXAM:   VS:  There were no vitals taken for this visit.   GEN: Well nourished, overweight, in no acute distress    HEENT: normal  Neck: no JVD, carotid bruits, or masses Cardiac: IRRR; no murmurs, rubs, or gallops,no edema  Respiratory:  clear to auscultation bilaterally, normal work of breathing GI: soft, nontender, nondistended, + BS MS: no deformity or atrophy  Skin: warm and dry, no rash Neuro:  Alert and Oriented x 3, Strength and sensation are intact Psych: euthymic mood, full affect  Wt Readings from Last 3 Encounters:  04/04/16 238 lb (108 kg)  03/27/16 235 lb 3.2 oz (106.7 kg)  12/01/15 221 lb (100.2 kg)      Studies/Labs Reviewed:   EKG:  EKG is ordered today.  The ekg ordered today demonstrates Atrial fibrillation with rate 73. Otherwise normal.  Recent Labs: 03/27/2016: Hemoglobin 15.0; Platelets 232; TSH 1.63 03/31/2016: BUN 28; Creat 1.55; Potassium 3.4; Sodium 140   Lipid Panel    Component Value Date/Time   CHOL 203 (H) 11/19/2015 0803   CHOL 195 03/08/2015 1116   TRIG 134 11/19/2015 0803   HDL 51 11/19/2015 0803   HDL 36 (L) 03/08/2015 1116   CHOLHDL 4.0 11/19/2015 0803   VLDL 27 11/19/2015 0803   LDLCALC 125 11/19/2015 0803   LDLCALC 123 (H) 03/08/2015 1116    Additional studies/ records that were reviewed today include:  Echo: 04/18/16: Study Conclusions  - Left ventricle: The cavity size was normal. There was mild   concentric hypertrophy. Systolic function was normal. The   estimated ejection fraction was in the range of 60% to 65%. Wall   motion was normal; there were no regional wall motion   abnormalities. - Mitral valve: Calcified annulus. Mildly thickened leaflets . - Left atrium: The atrium was mildly dilated. - Pulmonary arteries: PA peak pressure: 32 mm Hg (S).    ASSESSMENT:    No diagnosis found.   PLAN:  In order of problems listed above:  1. Atrial fibrillation is probably of longstanding duration. Rate is well controlled on metoprolol. [place] Vasc score of 3. Now on Eliquis for anticoagulation.  Would recommend long term strategy of  rate control and anticoagulation since he is asymptomatic. 2. BP is currently well controlled.     Medication Adjustments/Labs and Tests Ordered: Current medicines are reviewed at length with the patient today.  Concerns regarding medicines are outlined above.  Medication changes, Labs and Tests ordered today are listed in the Patient Instructions below. There are no Patient Instructions on file for this visit.   Signed, Gabrien Mentink Martinique, MD  06/23/2016 8:12 AM    [facility] [address], [place], [place], [address] [phone]

## 2016-06-26 ENCOUNTER — Ambulatory Visit: Payer: Medicare Other | Admitting: Cardiology

## 2016-07-10 ENCOUNTER — Other Ambulatory Visit: Payer: Self-pay | Admitting: Internal Medicine

## 2016-07-10 ENCOUNTER — Other Ambulatory Visit: Payer: Medicare Other

## 2016-07-12 ENCOUNTER — Ambulatory Visit: Payer: Medicare Other | Admitting: Internal Medicine

## 2016-07-17 ENCOUNTER — Other Ambulatory Visit: Payer: Self-pay

## 2016-07-17 ENCOUNTER — Other Ambulatory Visit: Payer: Medicare Other

## 2016-07-17 DIAGNOSIS — E7800 Pure hypercholesterolemia, unspecified: Secondary | ICD-10-CM

## 2016-07-17 DIAGNOSIS — N182 Chronic kidney disease, stage 2 (mild): Secondary | ICD-10-CM | POA: Diagnosis not present

## 2016-07-17 DIAGNOSIS — E1122 Type 2 diabetes mellitus with diabetic chronic kidney disease: Secondary | ICD-10-CM

## 2016-07-17 DIAGNOSIS — I10 Essential (primary) hypertension: Secondary | ICD-10-CM

## 2016-07-17 DIAGNOSIS — E1165 Type 2 diabetes mellitus with hyperglycemia: Principal | ICD-10-CM

## 2016-07-17 DIAGNOSIS — IMO0002 Reserved for concepts with insufficient information to code with codable children: Secondary | ICD-10-CM

## 2016-07-17 DIAGNOSIS — G8929 Other chronic pain: Secondary | ICD-10-CM

## 2016-07-17 DIAGNOSIS — M545 Low back pain: Principal | ICD-10-CM

## 2016-07-17 LAB — LIPID PANEL
CHOL/HDL RATIO: 3.3 ratio (ref <5.0)
Cholesterol: 168 mg/dL (ref <200)
HDL: 51 mg/dL (ref >40)
LDL CALC: 106 mg/dL — AB (ref <100)
TRIGLYCERIDES: 57 mg/dL (ref <150)
VLDL: 11 mg/dL (ref <30)

## 2016-07-17 LAB — BASIC METABOLIC PANEL
BUN: 37 mg/dL — AB (ref 7–25)
CHLORIDE: 96 mmol/L — AB (ref 98–110)
CO2: 30 mmol/L (ref 20–31)
Calcium: 9.3 mg/dL (ref 8.6–10.3)
Creat: 1.83 mg/dL — ABNORMAL HIGH (ref 0.70–1.18)
Glucose, Bld: 195 mg/dL — ABNORMAL HIGH (ref 65–99)
POTASSIUM: 3.7 mmol/L (ref 3.5–5.3)
SODIUM: 137 mmol/L (ref 135–146)

## 2016-07-17 MED ORDER — HYDROCODONE-ACETAMINOPHEN 10-325 MG PO TABS: 1.0000 | ORAL_TABLET | Freq: Four times a day (QID) | ORAL | 0 refills | Status: DC | PRN

## 2016-07-17 NOTE — Telephone Encounter (Signed)
Patient called and requested medication stated he has a lab appt. Stated that he would pick up at his lab appt at 11:15 am

## 2016-07-18 LAB — MICROALBUMIN, URINE: MICROALB UR: 2.8 mg/dL

## 2016-07-18 LAB — HEMOGLOBIN A1C
HEMOGLOBIN A1C: 8.3 % — AB (ref <5.7)
Mean Plasma Glucose: 192 mg/dL

## 2016-07-19 ENCOUNTER — Encounter: Payer: Self-pay | Admitting: Internal Medicine

## 2016-07-19 ENCOUNTER — Ambulatory Visit (INDEPENDENT_AMBULATORY_CARE_PROVIDER_SITE_OTHER): Payer: Medicare Other | Admitting: Internal Medicine

## 2016-07-19 VITALS — BP 114/64 | HR 75 | Temp 97.8°F | Ht 68.0 in | Wt 238.0 lb

## 2016-07-19 DIAGNOSIS — I481 Persistent atrial fibrillation: Secondary | ICD-10-CM | POA: Diagnosis not present

## 2016-07-19 DIAGNOSIS — I10 Essential (primary) hypertension: Secondary | ICD-10-CM

## 2016-07-19 DIAGNOSIS — E1122 Type 2 diabetes mellitus with diabetic chronic kidney disease: Secondary | ICD-10-CM

## 2016-07-19 DIAGNOSIS — E7800 Pure hypercholesterolemia, unspecified: Secondary | ICD-10-CM

## 2016-07-19 DIAGNOSIS — Z6836 Body mass index (BMI) 36.0-36.9, adult: Secondary | ICD-10-CM | POA: Diagnosis not present

## 2016-07-19 DIAGNOSIS — IMO0002 Reserved for concepts with insufficient information to code with codable children: Secondary | ICD-10-CM

## 2016-07-19 DIAGNOSIS — M545 Low back pain, unspecified: Secondary | ICD-10-CM

## 2016-07-19 DIAGNOSIS — G8929 Other chronic pain: Secondary | ICD-10-CM | POA: Diagnosis not present

## 2016-07-19 DIAGNOSIS — H60391 Other infective otitis externa, right ear: Secondary | ICD-10-CM

## 2016-07-19 DIAGNOSIS — I4819 Other persistent atrial fibrillation: Secondary | ICD-10-CM

## 2016-07-19 DIAGNOSIS — E1165 Type 2 diabetes mellitus with hyperglycemia: Secondary | ICD-10-CM

## 2016-07-19 DIAGNOSIS — E6609 Other obesity due to excess calories: Secondary | ICD-10-CM

## 2016-07-19 DIAGNOSIS — IMO0001 Reserved for inherently not codable concepts without codable children: Secondary | ICD-10-CM

## 2016-07-19 DIAGNOSIS — N182 Chronic kidney disease, stage 2 (mild): Secondary | ICD-10-CM

## 2016-07-19 DIAGNOSIS — H6090 Unspecified otitis externa, unspecified ear: Secondary | ICD-10-CM | POA: Insufficient documentation

## 2016-07-19 DIAGNOSIS — R609 Edema, unspecified: Secondary | ICD-10-CM

## 2016-07-19 MED ORDER — NEOMYCIN-POLYMYXIN-HC 3.5-10000-1 OT SOLN: OTIC | 0 refills | Status: DC

## 2016-07-19 MED ORDER — EMPAGLIFLOZIN 25 MG PO TABS: ORAL_TABLET | ORAL | 3 refills | Status: DC

## 2016-07-19 NOTE — Addendum Note (Signed)
Addended by: Estill Dooms on: 07/19/2016 03:11 PM   Modules accepted: Orders

## 2016-07-19 NOTE — Progress Notes (Addendum)
Facility  [facility]    Place of Service:   OFFICE    No Known Allergies  Chief Complaint  Patient presents with  . Medical Management of Chronic Issues    3 month medication management blood pressure, blood sugar, A-Fib, cholesterol, review labs.  . Ear Pain    right ear infected better today, last week worse.     HPI:  Uncontrolled type 2 diabetes mellitus with stage 2 chronic kidney disease, without long-term current use of insulin ([place]) - could be better controlled. Non compliant ewith diet.  Essential hypertension, malignant - controlled  Chronic kidney disease, stage II (mild) - slight worseniing over the last year  Persistent atrial fibrillation ([place]) - remains in controlled rate and is anticoagulated  Edema, unspecified type - occurs off and on. Does not use the furosemide every day. Goes down overnight.  Pure hypercholesterolemia - controlled  Chronic midline low back pain without sciatica - unchanged. contines to benefit from current pain meds.  Class 2 obesity due to excess calories with serious comorbidity and body mass index (BMI) of 36.0 to 36.9 in adult - lives alonbe. Non compliant with diet.    Medications: Patient's Medications  New Prescriptions   No medications on file  Previous Medications   ALLOPURINOL (ZYLOPRIM) 300 MG TABLET    take 1 tablet by mouth once daily   APIXABAN (ELIQUIS) 5 MG TABS TABLET    One twice daily to reduce the risk of stroke associated with atrial fibrillation   DOXAZOSIN (CARDURA) 8 MG TABLET    take 1 tablet by mouth once daily for blood pressure   EMPAGLIFLOZIN (JARDIANCE) 10 MG TABS TABLET    One each morning to control diabetes   FUROSEMIDE (LASIX) 20 MG TABLET    take 1 tablet by mouth every morning to REDUCE EDEMA   HYDROCODONE-ACETAMINOPHEN (NORCO) 10-325 MG TABLET    Take 1 tablet by mouth every 6 (six) hours as needed (pain).   METFORMIN (GLUCOPHAGE) 1000 MG TABLET    take 1 tablet by mouth once daily WITH BREAKFAST     METOPROLOL (LOPRESSOR) 50 MG TABLET    take 1 tablet by mouth twice a day TO CONTROL BP AND HEART RHYTHM   MULTIVITAMIN-IRON-MINERALS-FOLIC ACID (CENTRUM) CHEWABLE TABLET    Chew 1 tablet by mouth daily.   NAPROXEN (NAPROSYN) 500 MG TABLET    Take one tablet twice daily to help gout pains   OLMESARTAN-AMLODIPINE-HCTZ 40-10-25 MG TABS    take 1 tablet by mouth once daily for blood pressure  Modified Medications   No medications on file  Discontinued Medications   ALLOPURINOL (ZYLOPRIM) 300 MG TABLET    take 1 tablet by mouth once daily   APIXABAN (ELIQUIS) 5 MG TABS TABLET    One twice daily to reduce risk of stroke associated with atrial fibrillation    Review of Systems  Constitutional: Positive for fatigue. Negative for activity change, appetite change, fever and unexpected weight change.       Gaining weight  HENT: Positive for tinnitus. Negative for congestion, ear pain, hearing loss, rhinorrhea, sore throat, trouble swallowing and voice change.   Eyes: Negative.        Corrective lenses  Respiratory: Negative.  Negative for cough, choking, chest tightness, shortness of breath and wheezing.   Cardiovascular: Positive for palpitations and leg swelling. Negative for chest pain.       History of AF  Gastrointestinal: Negative.  Negative for abdominal distention, abdominal pain, constipation,  diarrhea, nausea, rectal pain and vomiting.  Endocrine: Negative for cold intolerance, heat intolerance, polydipsia, polyphagia and polyuria.       Diabetic  Genitourinary: Negative.  Negative for dysuria, frequency, testicular pain and urgency.       Not incontinent  Musculoskeletal: Positive for back pain. Negative for arthralgias, gait problem, myalgias and neck pain.       Hx gout  Skin: Negative.  Negative for color change, pallor and rash.  Allergic/Immunologic: Negative.   Neurological: Negative.  Negative for dizziness, tremors, syncope, speech difficulty, weakness, numbness and  headaches.  Hematological: Negative.  Negative for adenopathy. Does not bruise/bleed easily.  Psychiatric/Behavioral: Negative.  Negative for behavioral problems, confusion, decreased concentration, hallucinations and sleep disturbance. The patient is not nervous/anxious.   All other systems reviewed and are negative.   Vitals:   07/19/16 1413  BP: 114/64  Pulse: 75  Temp: 97.8 F (36.6 C)  TempSrc: Oral  SpO2: 98%  Weight: 238 lb (108 kg)  Height: _0  (1.727 m)   Body mass index is 36.19 kg/m. Wt Readings from Last 3 Encounters:  07/19/16 238 lb (108 kg)  04/04/16 238 lb (108 kg)  03/27/16 235 lb 3.2 oz (106.7 kg)      Physical Exam  Constitutional: He is oriented to person, place, and time. He appears well-developed and well-nourished. No distress.  overweight  HENT:  Head: Normocephalic and atraumatic.  Right Ear: External ear normal.  Left Ear: External ear normal.  Nose: Nose normal.  Mouth/Throat: Oropharynx is clear and moist. No oropharyngeal exudate.  Irritated in the right EAC  Eyes: Conjunctivae and EOM are normal. Pupils are equal, round, and reactive to light.  Neck: Normal range of motion. Neck supple. No JVD present. No tracheal deviation present. No thyromegaly present.  Cardiovascular: Normal rate, regular rhythm, normal heart sounds and intact distal pulses.  Exam reveals no gallop and no friction rub.   No murmur heard. Af. Rate controlled.  Pulmonary/Chest: Effort normal and breath sounds normal. No respiratory distress. He has no wheezes. He has no rales. He exhibits no tenderness.  Abdominal: Soft. Bowel sounds are normal. He exhibits no distension and no mass. There is no tenderness.  Musculoskeletal: Normal range of motion. He exhibits edema. He exhibits no tenderness.  Tender in lower back and hips.  Lymphadenopathy:    He has no cervical adenopathy.  Neurological: He is alert and oriented to person, place, and time. He has normal reflexes.  No cranial nerve deficit. Coordination normal.  Skin: No rash noted. No erythema. No pallor.  Psychiatric: He has a normal mood and affect. His behavior is normal. Judgment and thought content normal.    Labs reviewed: Lab Summary Latest Ref Rng & Units 07/17/2016 03/31/2016 03/27/2016 11/19/2015 06/28/2015  Hemoglobin 13.2 - 17.1 g/dL (None) (None) 15.0 (None) (None)  Hematocrit 38.5 - 50.0 % (None) (None) 45.7 (None) (None)  White count 3.8 - 10.8 K/uL (None) (None) 9.1 (None) (None)  Platelet count 140 - 400 K/uL (None) (None) 232 (None) (None)  Sodium 135 - 146 mmol/L 137 140 (None) 138 139  Potassium 3.5 - 5.3 mmol/L 3.7 3.4(L) (None) 4.0 4.0  Calcium 8.6 - 10.3 mg/dL 9.3 9.8 (None) 9.9 9.7  Phosphorus - (None) (None) (None) (None) (None)  Creatinine 0.70 - 1.18 mg/dL 1.83(H) 1.55(H) (None) 1.85(H) 1.52(H)  AST - (None) (None) (None) (None) (None)  Alk Phos - (None) (None) (None) (None) (None)  Bilirubin - (None) (None) (None) (None) (None)  Glucose 65 - 99 mg/dL 195(H) 220(H) (None) 168(H) 142(H)  Cholesterol <200 mg/dL 168 (None) (None) 203(H) (None)  HDL cholesterol >40 mg/dL 51 (None) (None) 51 (None)  Triglycerides <150 mg/dL 57 (None) (None) 134 (None)  LDL Direct - (None) (None) (None) (None) (None)  LDL Calc <100 mg/dL 106(H) (None) (None) 125 (None)  Total protein - (None) (None) (None) (None) (None)  Albumin - (None) (None) (None) (None) (None)  Some recent data might be hidden   Lab Results  Component Value Date   TSH 1.63 03/27/2016   Lab Results  Component Value Date   BUN 37 (H) 07/17/2016   BUN 28 (H) 03/31/2016   BUN 32 (H) 11/19/2015   Lab Results  Component Value Date   HGBA1C 8.3 (H) 07/17/2016   HGBA1C 8.6 (H) 03/31/2016   HGBA1C 7.7 (H) 11/19/2015    Assessment/Plan  1. Uncontrolled type 2 diabetes mellitus with stage 2 chronic kidney disease, without long-term current use of insulin  Increase dose of Jardiance: - empagliflozin (JARDIANCE) 25 MG  TABS tablet; Take one each morning to control diabetes  Dispense: 90 tablet; Refill: 3 - Hemoglobin A1c; Future - Basic metabolic panel; Future  2. Essential hypertension, malignant The current medical regimen is effective;  continue present plan and medications. - Basic metabolic panel; Future  3. Chronic kidney disease, stage II (mild) The current medical regimen is effective;  continue present plan and medications. - Basic metabolic panel; Future  4. Persistent atrial fibrillation (HCC) - EKG 12-Lead; Future  5. Edema, unspecified type Use furosemide  6. Pure hypercholesterolemia The current medical regimen is effective;  continue present plan and medications.  7. Chronic midline low back pain without sciatica The current medical regimen is effective;  continue present plan and medications.  8. Class 2 obesity due to excess calories with serious comorbidity and body mass index (BMI) of 36.0 to 36.9 in adult Strongly encouraged weight loss.  9. Right otitis -Cortisporin Otic 3 gtts tid to right EAC

## 2016-08-11 ENCOUNTER — Other Ambulatory Visit: Payer: Self-pay | Admitting: Internal Medicine

## 2016-08-16 ENCOUNTER — Other Ambulatory Visit: Payer: Self-pay | Admitting: *Deleted

## 2016-08-16 DIAGNOSIS — M545 Low back pain: Principal | ICD-10-CM

## 2016-08-16 DIAGNOSIS — G8929 Other chronic pain: Secondary | ICD-10-CM

## 2016-08-16 MED ORDER — HYDROCODONE-ACETAMINOPHEN 10-325 MG PO TABS
1.0000 | ORAL_TABLET | Freq: Four times a day (QID) | ORAL | 0 refills | Status: DC | PRN
Start: 2016-08-16 — End: 2016-09-15

## 2016-08-16 NOTE — Telephone Encounter (Signed)
Patient requested and will pick up 

## 2016-08-18 ENCOUNTER — Telehealth: Payer: Self-pay | Admitting: *Deleted

## 2016-08-18 NOTE — Telephone Encounter (Signed)
Tried calling patient, Voicemail not set up and cannot leave message. Will try again later.

## 2016-08-18 NOTE — Telephone Encounter (Signed)
Patient walked into office to pick up Rx and stated that he is having Watery eyes and Cough and wants to know what he can take for his allergies.  Also stated that since he has had the cough he pulled something in his back and wants a muscle relaxer.  Also stated that he can't sleep at night and wants to know what he can take for rest.  I offered patient an appointment but he refused and stated that he wasn't coming in for this. Please Advise.

## 2016-08-18 NOTE — Telephone Encounter (Signed)
May use Claritin 10 mg daily for allergies, also can use refresh eye drops as needed to help with eyes Can use cough suppressant such as delsym OTC which is a 12 hour cough syrup.  To use pain medication as prescribed for back pain. May also use OTC biofreeze, benegay, icyhot OR try the salonpas patch OTC Will need an appt if symptoms persist

## 2016-08-23 NOTE — Telephone Encounter (Signed)
Tried calling patient, Voicemail not set up and cannot leave message.

## 2016-09-04 ENCOUNTER — Other Ambulatory Visit: Payer: Self-pay | Admitting: Internal Medicine

## 2016-09-15 ENCOUNTER — Other Ambulatory Visit: Payer: Self-pay | Admitting: *Deleted

## 2016-09-15 DIAGNOSIS — M545 Low back pain: Principal | ICD-10-CM

## 2016-09-15 DIAGNOSIS — G8929 Other chronic pain: Secondary | ICD-10-CM

## 2016-09-15 MED ORDER — HYDROCODONE-ACETAMINOPHEN 10-325 MG PO TABS: 1.0000 | ORAL_TABLET | Freq: Four times a day (QID) | ORAL | 0 refills | Status: DC | PRN

## 2016-09-15 NOTE — Telephone Encounter (Signed)
Patient requested and will pick up 

## 2016-10-04 NOTE — Addendum Note (Signed)
Addended by: Royann Shivers A on: 10/04/2016 02:39 PM   Modules accepted: Orders

## 2016-10-10 ENCOUNTER — Other Ambulatory Visit: Payer: Self-pay | Admitting: Internal Medicine

## 2016-10-10 DIAGNOSIS — R609 Edema, unspecified: Secondary | ICD-10-CM

## 2016-10-13 ENCOUNTER — Other Ambulatory Visit: Payer: Self-pay | Admitting: *Deleted

## 2016-10-13 DIAGNOSIS — G8929 Other chronic pain: Secondary | ICD-10-CM

## 2016-10-13 DIAGNOSIS — M545 Low back pain: Principal | ICD-10-CM

## 2016-10-13 MED ORDER — HYDROCODONE-ACETAMINOPHEN 10-325 MG PO TABS
1.0000 | ORAL_TABLET | Freq: Four times a day (QID) | ORAL | 0 refills | Status: DC | PRN
Start: 2016-10-13 — End: 2016-11-14

## 2016-10-13 NOTE — Telephone Encounter (Signed)
Patient requested and will pick up 

## 2016-11-14 ENCOUNTER — Other Ambulatory Visit: Payer: Self-pay | Admitting: Nurse Practitioner

## 2016-11-14 ENCOUNTER — Other Ambulatory Visit: Payer: Medicare Other

## 2016-11-14 ENCOUNTER — Other Ambulatory Visit: Payer: Self-pay

## 2016-11-14 DIAGNOSIS — E1122 Type 2 diabetes mellitus with diabetic chronic kidney disease: Secondary | ICD-10-CM | POA: Diagnosis not present

## 2016-11-14 DIAGNOSIS — E1165 Type 2 diabetes mellitus with hyperglycemia: Secondary | ICD-10-CM | POA: Diagnosis not present

## 2016-11-14 DIAGNOSIS — I10 Essential (primary) hypertension: Secondary | ICD-10-CM

## 2016-11-14 DIAGNOSIS — G8929 Other chronic pain: Secondary | ICD-10-CM

## 2016-11-14 DIAGNOSIS — IMO0001 Reserved for inherently not codable concepts without codable children: Secondary | ICD-10-CM

## 2016-11-14 DIAGNOSIS — M545 Low back pain: Principal | ICD-10-CM

## 2016-11-14 DIAGNOSIS — R609 Edema, unspecified: Secondary | ICD-10-CM

## 2016-11-14 DIAGNOSIS — N182 Chronic kidney disease, stage 2 (mild): Secondary | ICD-10-CM | POA: Diagnosis not present

## 2016-11-14 DIAGNOSIS — IMO0002 Reserved for concepts with insufficient information to code with codable children: Secondary | ICD-10-CM

## 2016-11-14 MED ORDER — HYDROCODONE-ACETAMINOPHEN 10-325 MG PO TABS: 1.0000 | ORAL_TABLET | Freq: Four times a day (QID) | ORAL | 0 refills | Status: DC | PRN

## 2016-11-14 NOTE — Telephone Encounter (Signed)
Last refill 10/13/16. appt 11/16/16 with Janett Billow.

## 2016-11-15 LAB — HEMOGLOBIN A1C
Hgb A1c MFr Bld: 8.4 % — ABNORMAL HIGH (ref <5.7)
Mean Plasma Glucose: 194 mg/dL

## 2016-11-15 LAB — BASIC METABOLIC PANEL
BUN: 27 mg/dL — ABNORMAL HIGH (ref 7–25)
CALCIUM: 10 mg/dL (ref 8.6–10.3)
CO2: 33 mmol/L — AB (ref 20–31)
Chloride: 97 mmol/L — ABNORMAL LOW (ref 98–110)
Creat: 1.62 mg/dL — ABNORMAL HIGH (ref 0.70–1.18)
GLUCOSE: 189 mg/dL — AB (ref 65–99)
Potassium: 4.2 mmol/L (ref 3.5–5.3)
SODIUM: 142 mmol/L (ref 135–146)

## 2016-11-16 ENCOUNTER — Encounter: Payer: Self-pay | Admitting: Nurse Practitioner

## 2016-11-16 ENCOUNTER — Ambulatory Visit (INDEPENDENT_AMBULATORY_CARE_PROVIDER_SITE_OTHER): Payer: Medicare Other | Admitting: Nurse Practitioner

## 2016-11-16 VITALS — BP 122/74 | HR 63 | Temp 98.1°F | Resp 17 | Ht 68.0 in | Wt 232.6 lb

## 2016-11-16 DIAGNOSIS — N182 Chronic kidney disease, stage 2 (mild): Secondary | ICD-10-CM | POA: Diagnosis not present

## 2016-11-16 DIAGNOSIS — M1A9XX0 Chronic gout, unspecified, without tophus (tophi): Secondary | ICD-10-CM

## 2016-11-16 DIAGNOSIS — IMO0002 Reserved for concepts with insufficient information to code with codable children: Secondary | ICD-10-CM

## 2016-11-16 DIAGNOSIS — R609 Edema, unspecified: Secondary | ICD-10-CM | POA: Diagnosis not present

## 2016-11-16 DIAGNOSIS — I10 Essential (primary) hypertension: Secondary | ICD-10-CM | POA: Diagnosis not present

## 2016-11-16 DIAGNOSIS — E1165 Type 2 diabetes mellitus with hyperglycemia: Secondary | ICD-10-CM

## 2016-11-16 DIAGNOSIS — I481 Persistent atrial fibrillation: Secondary | ICD-10-CM

## 2016-11-16 DIAGNOSIS — E1122 Type 2 diabetes mellitus with diabetic chronic kidney disease: Secondary | ICD-10-CM

## 2016-11-16 DIAGNOSIS — I4819 Other persistent atrial fibrillation: Secondary | ICD-10-CM

## 2016-11-16 DIAGNOSIS — Z23 Encounter for immunization: Secondary | ICD-10-CM

## 2016-11-16 MED ORDER — METFORMIN HCL 1000 MG PO TABS: 1000.0000 mg | ORAL_TABLET | Freq: Two times a day (BID) | ORAL | 3 refills | Status: DC

## 2016-11-16 NOTE — Progress Notes (Signed)
Careteam: Patient Care Team: Lauree Chandler, NP as PCP - General (Geriatric Medicine)  Advanced Directive information Does Patient Have a Medical Advance Directive?: Yes, Type of Advance Directive: [place];Living will  No Known Allergies  Chief Complaint  Patient presents with  . Medical Management of Chronic Issues    Pt is being seen for a 3 month routine visit. Pt wants prevnar 13 today. Does not want shingrix     HPI: Patient is a 76 y.o. male seen in the office today for routine follow up.  Former pt of Dr Nyoka Cowden. Pt with hx of uncontrolled DM with CKD, htn, a fib, venous insufficiency, hyperlipidemia, chronic back pain, obesity and others Doing well. No new complaints since previous visit with Dr Nyoka Cowden.  Uses naproxen as needed for pain in his big toe that he associates with gout. Rarely uses (1 time in 8 month). Takes 1 pill and goes away.  Edema- uses lasix every day. Swelling is Not bad at all.  htn- takes blood pressure at home- 120s/70s at home. Taking olmesartan-amldipine-hctz and lopressor twice daily and doxazosin  noncompliant with diet- eats a lot of sodium, states he does not eat right.  Unable to do exercise due to pain in hips and back- uses hydrocodone/apap for this pain. Takes medication from this office only. Takes all that is prescribed and does not have side effects from medication.   Review of Systems:  Review of Systems  Constitutional: Negative for chills, fever and weight loss.  HENT: Negative for tinnitus.   Respiratory: Negative for cough, sputum production and shortness of breath.   Cardiovascular: Negative for chest pain, palpitations and leg swelling.  Gastrointestinal: Negative for abdominal pain, constipation, diarrhea and heartburn.  Genitourinary: Negative for dysuria, frequency and urgency.  Musculoskeletal: Positive for back pain and joint pain. Negative for falls and myalgias.  Skin: Negative.   Neurological:  Negative for dizziness and headaches.  Psychiatric/Behavioral: Negative for depression and memory loss. The patient does not have insomnia.     Past Medical History:  Diagnosis Date  . Chronic kidney disease, stage II (mild)   . Depression   . Diabetes mellitus without complication ([place])   . Essential hypertension, malignant   . Gout, unspecified   . Hyperlipidemia   . Lumbago   . Obesity, unspecified   . Pain in joint, pelvic region and thigh   . Special screening for malignant neoplasm of prostate    Past Surgical History:  Procedure Laterality Date  . APPENDECTOMY    . CYST EXCISION  1998   back  . 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 His smokeless tobacco use includes Chew. He reports that he drinks about 3.0 oz of alcohol per week . He reports that he does not use drugs.  Family History  Problem Relation Age of Onset  . Heart Problems Father   . Heart disease Brother   . Diabetes Brother   . Diabetes Brother   . Heart Problems Mother   . Pneumonia Maternal Grandfather     Medications: Patient's Medications  New Prescriptions   No medications on file  Previous Medications   ALLOPURINOL (ZYLOPRIM) 300 MG TABLET    take 1 tablet by mouth once daily   APIXABAN (ELIQUIS) 5 MG TABS TABLET    One twice daily to reduce the risk of stroke associated with atrial  fibrillation   DOXAZOSIN (CARDURA) 8 MG TABLET    take 1 tablet by mouth once daily for blood pressure   EMPAGLIFLOZIN (JARDIANCE) 25 MG TABS TABLET    Take one each morning to control diabetes   FUROSEMIDE (LASIX) 20 MG TABLET    take 1 tablet by mouth every morning to REDUCE EDEMA   HYDROCODONE-ACETAMINOPHEN (NORCO) 10-325 MG TABLET    Take 1 tablet by mouth every 6 (six) hours as needed (pain).   METFORMIN (GLUCOPHAGE) 1000 MG TABLET    take 1 tablet by mouth once daily WITH BREAKFAST   METOPROLOL (LOPRESSOR) 50 MG TABLET     take 1 tablet by mouth twice a day TO CONTROL BP AND HEART RHYTHM   MULTIVITAMIN-IRON-MINERALS-FOLIC ACID (CENTRUM) CHEWABLE TABLET    Chew 1 tablet by mouth daily.   NAPROXEN (NAPROSYN) 500 MG TABLET    Take one tablet twice daily to help gout pains   OLMESARTAN-AMLODIPINE-HCTZ 40-10-25 MG TABS    take 1 tablet by mouth once daily for blood pressure  Modified Medications   No medications on file  Discontinued Medications   NEOMYCIN-POLYMYXIN-HYDROCORTISONE (CORTISPORIN) OTIC SOLUTION    3 drops in affected ear 3 times daily to treat infection     Physical Exam:  Vitals:   11/16/16 1310  BP: 122/74  Pulse: 63  Resp: 17  Temp: 98.1 F (36.7 C)  TempSrc: Oral  SpO2: 96%  Weight: 232 lb 9.6 oz (105.5 kg)  Height: 5' 8"  (1.727 m)   Body mass index is 35.37 kg/m.  Physical Exam  Constitutional: He is oriented to person, place, and time. He appears well-developed and well-nourished. No distress.  overweight  HENT:  Head: Normocephalic and atraumatic.  Nose: Nose normal.  Mouth/Throat: Oropharynx is clear and moist. No oropharyngeal exudate.  Irritated in the right EAC  Eyes: Pupils are equal, round, and reactive to light. Conjunctivae and EOM are normal.  Neck: Normal range of motion. Neck supple. No JVD present. No tracheal deviation present. No thyromegaly present.  Cardiovascular: Normal rate and normal heart sounds.  An irregularly irregular rhythm present. Exam reveals no gallop and no friction rub.   No murmur heard. Pulmonary/Chest: Effort normal and breath sounds normal. No respiratory distress. He has no wheezes. He has no rales. He exhibits no tenderness.  Abdominal: Soft. Bowel sounds are normal. He exhibits no distension and no mass. There is no tenderness.  Musculoskeletal: Normal range of motion. He exhibits edema (trace). He exhibits no tenderness.  Lymphadenopathy:    He has no cervical adenopathy.  Neurological: He is alert and oriented to person, place, and  time. He has normal reflexes. No cranial nerve deficit. Coordination normal.  Skin: No rash noted. No erythema. No pallor.  Psychiatric: He has a normal mood and affect. His behavior is normal. Judgment and thought content normal.    Labs reviewed: Basic Metabolic Panel:  Recent Labs  03/27/16 1141 03/31/16 0811 07/17/16 1100 11/14/16 0216  NA  --  140 137 142  K  --  3.4* 3.7 4.2  CL  --  96* 96* 97*  CO2  --  33* 30 33*  GLUCOSE  --  220* 195* 189*  BUN  --  28* 37* 27*  CREATININE  --  1.55* 1.83* 1.62*  CALCIUM  --  9.8 9.3 10.0  TSH 1.63  --   --   --   Estimated Creatinine Clearance: 47.1 mL/min (A) (by C-G formula based on SCr of 1.62  mg/dL (H)).  Liver Function Tests: No results for input(s): AST, ALT, ALKPHOS, BILITOT, PROT, ALBUMIN in the last 8760 hours. No results for input(s): LIPASE, AMYLASE in the last 8760 hours. No results for input(s): AMMONIA in the last 8760 hours. CBC:  Recent Labs  03/27/16 1141  WBC 9.1  NEUTROABS 6,734  HGB 15.0  HCT 45.7  MCV 91.4  PLT 232   Lipid Panel:  Recent Labs  11/19/15 0803 07/17/16 1100  CHOL 203* 168  HDL 51 51  LDLCALC 125 106*  TRIG 134 57  CHOLHDL 4.0 3.3   TSH:  Recent Labs  03/27/16 1141  TSH 1.63   A1C: Lab Results  Component Value Date   HGBA1C 8.4 (H) 11/14/2016     Assessment/Plan 1. Need for pneumococcal vaccine - Pneumococcal conjugate vaccine 13-valent  2. Uncontrolled type 2 diabetes mellitus with stage 2 chronic kidney disease, without long-term current use of insulin (HCC) -not controlled, poor diet choices. Discussed dietary changes.  Will increase metformin to 1000 mg BID with meals. To cont jardiance 25 mg by mouth daily  - metFORMIN (GLUCOPHAGE) 1000 MG tablet; Take 1 tablet (1,000 mg total) by mouth 2 (two) times daily with a meal.  Dispense: 30 tablet; Refill: 3 - CMP with eGFR; Future - Hemoglobin A1c; Future  3. Essential hypertension, malignant Stable on current  regimen, to take blood pressure once stopping lasix. Cont current regimen and discussed DASH diet.   4. Chronic kidney disease, stage II (mild) Stable, encouraged proper hydration. Will stop lasix and instructed to avoid NSAIDS.   5. Persistent atrial fibrillation (HCC) -rate controlled, conts on lopressor for rate and eliquis for anticoagulation.  - CBC with Differential/Platelets; Future  6. Edema, unspecified type -improved, currently without edema. Will stop lasix at this time and monitor.   7. Chronic gout involving toe without tophus, unspecified cause, unspecified laterality -unsure if he is taking allopurinol, will call us with this once he gets home -uses naproxen rarely as needed for pain, due to renal disease would prefer him not taking this if possible.  - Uric acid; Future  To follow up in 3 months with labs prior to visit.  Carlos American. Harle Battiest  [facility] [phone] 8 am - 5 pm) [phone] (after hours)

## 2016-11-16 NOTE — Patient Instructions (Addendum)
To increase metformin to 1000 mg by mouth twice daily  STOP LASIX  Verify if you are taking the allopurinol   DASH Eating Plan DASH stands for "Dietary Approaches to Stop Hypertension." The DASH eating plan is a healthy eating plan that has been shown to reduce high blood pressure (hypertension). It may also reduce your risk for type 2 diabetes, heart disease, and stroke. The DASH eating plan may also help with weight loss. What are tips for following this plan? General guidelines  Avoid eating more than 2,300 mg (milligrams) of salt (sodium) a day. If you have hypertension, you may need to reduce your sodium intake to 1,500 mg a day.  Limit alcohol intake to no more than 1 drink a day for nonpregnant women and 2 drinks a day for men. One drink equals 12 oz of beer, 5 oz of wine, or 1 oz of hard liquor.  Work with your health care provider to maintain a healthy body weight or to lose weight. Ask what an ideal weight is for you.  Get at least 30 minutes of exercise that causes your heart to beat faster (aerobic exercise) most days of the week. Activities may include walking, swimming, or biking.  Work with your health care provider or diet and nutrition specialist (dietitian) to adjust your eating plan to your individual calorie needs. Reading food labels  Check food labels for the amount of sodium per serving. Choose foods with less than 5 percent of the Daily Value of sodium. Generally, foods with less than 300 mg of sodium per serving fit into this eating plan.  To find whole grains, look for the word "whole" as the first word in the ingredient list. Shopping  Buy products labeled as "low-sodium" or "no salt added."  Buy fresh foods. Avoid canned foods and premade or frozen meals. Cooking  Avoid adding salt when cooking. Use salt-free seasonings or herbs instead of table salt or sea salt. Check with your health care provider or pharmacist before using salt substitutes.  Do not fry  foods. Cook foods using healthy methods such as baking, boiling, grilling, and broiling instead.  Cook with heart-healthy oils, such as olive, canola, soybean, or sunflower oil. Meal planning   Eat a balanced diet that includes: ? 5 or more servings of fruits and vegetables each day. At each meal, try to fill half of your plate with fruits and vegetables. ? Up to 6-8 servings of whole grains each day. ? Less than 6 oz of lean meat, poultry, or fish each day. A 3-oz serving of meat is about the same size as a deck of cards. One egg equals 1 oz. ? 2 servings of low-fat dairy each day. ? A serving of nuts, seeds, or beans 5 times each week. ? Heart-healthy fats. Healthy fats called Omega-3 fatty acids are found in foods such as flaxseeds and coldwater fish, like sardines, salmon, and mackerel.  Limit how much you eat of the following: ? Canned or prepackaged foods. ? Food that is high in trans fat, such as fried foods. ? Food that is high in saturated fat, such as fatty meat. ? Sweets, desserts, sugary drinks, and other foods with added sugar. ? Full-fat dairy products.  Do not salt foods before eating.  Try to eat at least 2 vegetarian meals each week.  Eat more home-cooked food and less restaurant, buffet, and fast food.  When eating at a restaurant, ask that your food be prepared with less salt or  no salt, if possible. What foods are recommended? The items listed may not be a complete list. Talk with your dietitian about what dietary choices are best for you. Grains Whole-grain or whole-wheat bread. Whole-grain or whole-wheat pasta. Brown rice. [place]. Bulgur. Whole-grain and low-sodium cereals. Pita bread. Low-fat, low-sodium crackers. Whole-wheat flour tortillas. Vegetables Fresh or frozen vegetables (raw, steamed, roasted, or grilled). Low-sodium or reduced-sodium tomato and vegetable juice. Low-sodium or reduced-sodium tomato sauce and tomato paste. Low-sodium or  reduced-sodium canned vegetables. Fruits All fresh, dried, or frozen fruit. Canned fruit in natural juice (without added sugar). Meat and other protein foods Skinless chicken or [place]. Ground chicken or [place]. Pork with fat trimmed off. Fish and seafood. Egg whites. Dried beans, peas, or lentils. Unsalted nuts, nut butters, and seeds. Unsalted canned beans. Lean cuts of beef with fat trimmed off. Low-sodium, lean deli meat. Dairy Low-fat (1%) or fat-free (skim) milk. Fat-free, low-fat, or reduced-fat cheeses. Nonfat, low-sodium ricotta or cottage cheese. Low-fat or nonfat yogurt. Low-fat, low-sodium cheese. Fats and oils Soft margarine without trans fats. Vegetable oil. Low-fat, reduced-fat, or light mayonnaise and salad dressings (reduced-sodium). Canola, safflower, olive, soybean, and sunflower oils. Avocado. Seasoning and other foods Herbs. Spices. Seasoning mixes without salt. Unsalted popcorn and pretzels. Fat-free sweets. What foods are not recommended? The items listed may not be a complete list. Talk with your dietitian about what dietary choices are best for you. Grains Baked goods made with fat, such as croissants, muffins, or some breads. Dry pasta or rice meal packs. Vegetables Creamed or fried vegetables. Vegetables in a cheese sauce. Regular canned vegetables (not low-sodium or reduced-sodium). Regular canned tomato sauce and paste (not low-sodium or reduced-sodium). Regular tomato and vegetable juice (not low-sodium or reduced-sodium). Angie Fava. Olives. Fruits Canned fruit in a light or heavy syrup. Fried fruit. Fruit in cream or butter sauce. Meat and other protein foods Fatty cuts of meat. Ribs. Fried meat. Berniece Salines. Sausage. Bologna and other processed lunch meats. Salami. Fatback. Hotdogs. Bratwurst. Salted nuts and seeds. Canned beans with added salt. Canned or smoked fish. Whole eggs or egg yolks. Chicken or [place] with skin. Dairy Whole or 2% milk, cream, and half-and-half.  Whole or full-fat cream cheese. Whole-fat or sweetened yogurt. Full-fat cheese. Nondairy creamers. Whipped toppings. Processed cheese and cheese spreads. Fats and oils Butter. Stick margarine. Lard. Shortening. Ghee. Bacon fat. Tropical oils, such as coconut, palm kernel, or palm oil. Seasoning and other foods Salted popcorn and pretzels. Onion salt, garlic salt, seasoned salt, table salt, and sea salt. Worcestershire sauce. Tartar sauce. Barbecue sauce. Teriyaki sauce. Soy sauce, including reduced-sodium. Steak sauce. Canned and packaged gravies. Fish sauce. Oyster sauce. Cocktail sauce. Horseradish that you find on the shelf. Ketchup. Mustard. Meat flavorings and tenderizers. Bouillon cubes. Hot sauce and Tabasco sauce. Premade or packaged marinades. Premade or packaged taco seasonings. Relishes. Regular salad dressings. Where to find more information:  National Heart, Lung, and [place]: https://wilson-eaton.com/  American Heart Association: www.heart.org Summary  The DASH eating plan is a healthy eating plan that has been shown to reduce high blood pressure (hypertension). It may also reduce your risk for type 2 diabetes, heart disease, and stroke.  With the DASH eating plan, you should limit salt (sodium) intake to 2,300 mg a day. If you have hypertension, you may need to reduce your sodium intake to 1,500 mg a day.  When on the DASH eating plan, aim to eat more fresh fruits and vegetables, whole grains, lean proteins, low-fat dairy, and  heart-healthy fats.  Work with your health care provider or diet and nutrition specialist (dietitian) to adjust your eating plan to your individual calorie needs. This information is not intended to replace advice given to you by your health care provider. Make sure you discuss any questions you have with your health care provider. Document Released: 03/23/2011 Document Revised: 03/27/2016 Document Reviewed: 03/27/2016 Elsevier Interactive Patient Education   2017 [employer].

## 2016-11-17 LAB — URIC ACID: Uric Acid, Serum: 12.5 mg/dL — ABNORMAL HIGH (ref 4.0–8.0)

## 2016-11-21 ENCOUNTER — Other Ambulatory Visit: Payer: Self-pay

## 2016-11-21 DIAGNOSIS — M1A9XX0 Chronic gout, unspecified, without tophus (tophi): Secondary | ICD-10-CM

## 2016-11-28 DIAGNOSIS — Z1211 Encounter for screening for malignant neoplasm of colon: Secondary | ICD-10-CM | POA: Diagnosis not present

## 2016-11-28 DIAGNOSIS — Z1212 Encounter for screening for malignant neoplasm of rectum: Secondary | ICD-10-CM | POA: Diagnosis not present

## 2016-11-28 LAB — COLOGUARD: COLOGUARD: POSITIVE

## 2016-12-06 ENCOUNTER — Other Ambulatory Visit: Payer: Self-pay | Admitting: Nurse Practitioner

## 2016-12-06 ENCOUNTER — Encounter: Payer: Self-pay | Admitting: *Deleted

## 2016-12-06 ENCOUNTER — Encounter: Payer: Self-pay | Admitting: Nurse Practitioner

## 2016-12-06 DIAGNOSIS — R195 Other fecal abnormalities: Secondary | ICD-10-CM

## 2016-12-06 NOTE — Progress Notes (Signed)
cologuard results came back positive, called Pt and gave him these results. Explained that a positive results indicated either precancerous or colorectal cancer and that he would need further evaluation by GI via colonoscopy.  Order has been placed for referral

## 2016-12-08 ENCOUNTER — Encounter: Payer: Self-pay | Admitting: Gastroenterology

## 2016-12-15 ENCOUNTER — Other Ambulatory Visit: Payer: Self-pay | Admitting: *Deleted

## 2016-12-15 DIAGNOSIS — G8929 Other chronic pain: Secondary | ICD-10-CM

## 2016-12-15 DIAGNOSIS — M545 Low back pain: Principal | ICD-10-CM

## 2016-12-15 MED ORDER — HYDROCODONE-ACETAMINOPHEN 10-325 MG PO TABS: 1.0000 | ORAL_TABLET | Freq: Four times a day (QID) | ORAL | 0 refills | Status: DC | PRN

## 2016-12-15 NOTE — Telephone Encounter (Signed)
Patient requested and will pick up [place]CC[place] Database Checked.  

## 2017-01-06 ENCOUNTER — Other Ambulatory Visit: Payer: Self-pay | Admitting: Internal Medicine

## 2017-01-15 ENCOUNTER — Other Ambulatory Visit: Payer: Self-pay | Admitting: *Deleted

## 2017-01-15 DIAGNOSIS — G8929 Other chronic pain: Secondary | ICD-10-CM

## 2017-01-15 DIAGNOSIS — M545 Low back pain, unspecified: Secondary | ICD-10-CM

## 2017-01-15 MED ORDER — HYDROCODONE-ACETAMINOPHEN 10-325 MG PO TABS: 1.0000 | ORAL_TABLET | Freq: Four times a day (QID) | ORAL | 0 refills | Status: DC | PRN

## 2017-01-15 NOTE — Telephone Encounter (Signed)
Patient requested and will pick up [place]CC[place] Database Checked.  

## 2017-01-18 ENCOUNTER — Telehealth: Payer: Self-pay | Admitting: *Deleted

## 2017-01-18 NOTE — Telephone Encounter (Signed)
Attempted to call patient to clarify if he is taking Eliquis or not.  It is still in his list of medications but was noted in appointment notes that he was not taking it.  There was no answer and no voicemail to leave message.  Will need to attempt patient again to confirm.

## 2017-02-08 ENCOUNTER — Encounter: Payer: Medicare Other | Admitting: Gastroenterology

## 2017-02-09 ENCOUNTER — Other Ambulatory Visit: Payer: Self-pay | Admitting: *Deleted

## 2017-02-09 MED ORDER — DOXAZOSIN MESYLATE 8 MG PO TABS: ORAL_TABLET | ORAL | 5 refills | Status: DC

## 2017-02-09 NOTE — Telephone Encounter (Signed)
[facility] 

## 2017-02-14 ENCOUNTER — Other Ambulatory Visit: Payer: Self-pay

## 2017-02-14 DIAGNOSIS — M545 Low back pain: Principal | ICD-10-CM

## 2017-02-14 DIAGNOSIS — G8929 Other chronic pain: Secondary | ICD-10-CM

## 2017-02-14 MED ORDER — HYDROCODONE-ACETAMINOPHEN 10-325 MG PO TABS: 1.0000 | ORAL_TABLET | Freq: Four times a day (QID) | ORAL | 0 refills | Status: DC | PRN

## 2017-02-14 NOTE — Telephone Encounter (Signed)
[place] Database verified and compliance confirmed   

## 2017-02-15 ENCOUNTER — Other Ambulatory Visit: Payer: Medicare Other

## 2017-02-15 DIAGNOSIS — E1122 Type 2 diabetes mellitus with diabetic chronic kidney disease: Secondary | ICD-10-CM | POA: Diagnosis not present

## 2017-02-15 DIAGNOSIS — N182 Chronic kidney disease, stage 2 (mild): Secondary | ICD-10-CM | POA: Diagnosis not present

## 2017-02-15 DIAGNOSIS — M1A9XX0 Chronic gout, unspecified, without tophus (tophi): Secondary | ICD-10-CM

## 2017-02-15 DIAGNOSIS — I481 Persistent atrial fibrillation: Secondary | ICD-10-CM | POA: Diagnosis not present

## 2017-02-15 DIAGNOSIS — I4819 Other persistent atrial fibrillation: Secondary | ICD-10-CM

## 2017-02-15 DIAGNOSIS — IMO0002 Reserved for concepts with insufficient information to code with codable children: Secondary | ICD-10-CM

## 2017-02-15 DIAGNOSIS — E1165 Type 2 diabetes mellitus with hyperglycemia: Principal | ICD-10-CM

## 2017-02-16 LAB — CBC WITH DIFFERENTIAL/PLATELET
BASOS ABS: 59 {cells}/uL (ref 0–200)
Basophils Relative: 0.7 %
EOS ABS: 378 {cells}/uL (ref 15–500)
Eosinophils Relative: 4.5 %
HEMATOCRIT: 41.3 % (ref 38.5–50.0)
HEMOGLOBIN: 13.7 g/dL (ref 13.2–17.1)
LYMPHS ABS: 1336 {cells}/uL (ref 850–3900)
MCH: 28.4 pg (ref 27.0–33.0)
MCHC: 33.2 g/dL (ref 32.0–36.0)
MCV: 85.7 fL (ref 80.0–100.0)
MPV: 10.1 fL (ref 7.5–12.5)
Monocytes Relative: 7.8 %
NEUTROS ABS: 5972 {cells}/uL (ref 1500–7800)
Neutrophils Relative %: 71.1 %
Platelets: 255 10*3/uL (ref 140–400)
RBC: 4.82 10*6/uL (ref 4.20–5.80)
RDW: 12.8 % (ref 11.0–15.0)
Total Lymphocyte: 15.9 %
WBC mixed population: 655 cells/uL (ref 200–950)
WBC: 8.4 10*3/uL (ref 3.8–10.8)

## 2017-02-16 LAB — HEMOGLOBIN A1C
HEMOGLOBIN A1C: 9.3 %{Hb} — AB (ref <5.7)
MEAN PLASMA GLUCOSE: 220 (calc)
eAG (mmol/L): 12.2 (calc)

## 2017-02-16 LAB — COMPLETE METABOLIC PANEL WITH GFR
AG RATIO: 1.5 (calc) (ref 1.0–2.5)
ALBUMIN MSPROF: 3.9 g/dL (ref 3.6–5.1)
ALT: 10 U/L (ref 9–46)
AST: 10 U/L (ref 10–35)
Alkaline phosphatase (APISO): 77 U/L (ref 40–115)
BUN/Creatinine Ratio: 12 (calc) (ref 6–22)
BUN: 20 mg/dL (ref 7–25)
CALCIUM: 10 mg/dL (ref 8.6–10.3)
CO2: 32 mmol/L (ref 20–32)
Chloride: 97 mmol/L — ABNORMAL LOW (ref 98–110)
Creat: 1.61 mg/dL — ABNORMAL HIGH (ref 0.70–1.18)
GFR, EST AFRICAN AMERICAN: 48 mL/min/{1.73_m2} — AB (ref >=60)
GFR, EST NON AFRICAN AMERICAN: 41 mL/min/{1.73_m2} — AB (ref >=60)
Globulin: 2.6 g/dL (calc) (ref 1.9–3.7)
Glucose, Bld: 203 mg/dL — ABNORMAL HIGH (ref 65–99)
POTASSIUM: 3.5 mmol/L (ref 3.5–5.3)
Sodium: 139 mmol/L (ref 135–146)
TOTAL PROTEIN: 6.5 g/dL (ref 6.1–8.1)
Total Bilirubin: 0.9 mg/dL (ref 0.2–1.2)

## 2017-02-16 LAB — URIC ACID: Uric Acid, Serum: 9.3 mg/dL — ABNORMAL HIGH (ref 4.0–8.0)

## 2017-02-19 ENCOUNTER — Ambulatory Visit (INDEPENDENT_AMBULATORY_CARE_PROVIDER_SITE_OTHER): Payer: Medicare Other | Admitting: Nurse Practitioner

## 2017-02-19 ENCOUNTER — Encounter: Payer: Self-pay | Admitting: Nurse Practitioner

## 2017-02-19 VITALS — BP 118/72 | HR 78 | Temp 97.5°F | Resp 18 | Ht 68.0 in | Wt 240.8 lb

## 2017-02-19 DIAGNOSIS — E1165 Type 2 diabetes mellitus with hyperglycemia: Secondary | ICD-10-CM | POA: Diagnosis not present

## 2017-02-19 DIAGNOSIS — Z6836 Body mass index (BMI) 36.0-36.9, adult: Secondary | ICD-10-CM

## 2017-02-19 DIAGNOSIS — E1129 Type 2 diabetes mellitus with other diabetic kidney complication: Secondary | ICD-10-CM | POA: Diagnosis not present

## 2017-02-19 DIAGNOSIS — E7800 Pure hypercholesterolemia, unspecified: Secondary | ICD-10-CM | POA: Diagnosis not present

## 2017-02-19 DIAGNOSIS — I10 Essential (primary) hypertension: Secondary | ICD-10-CM | POA: Diagnosis not present

## 2017-02-19 DIAGNOSIS — G8929 Other chronic pain: Secondary | ICD-10-CM | POA: Diagnosis not present

## 2017-02-19 DIAGNOSIS — IMO0002 Reserved for concepts with insufficient information to code with codable children: Secondary | ICD-10-CM

## 2017-02-19 DIAGNOSIS — M109 Gout, unspecified: Secondary | ICD-10-CM | POA: Diagnosis not present

## 2017-02-19 DIAGNOSIS — M545 Low back pain: Secondary | ICD-10-CM | POA: Diagnosis not present

## 2017-02-19 MED ORDER — FEBUXOSTAT 40 MG PO TABS: 40.0000 mg | ORAL_TABLET | Freq: Every day | ORAL | 3 refills | Status: DC

## 2017-02-19 NOTE — Progress Notes (Signed)
Careteam: Patient Care Team: Lauree Chandler, NP as PCP - General (Geriatric Medicine)  Advanced Directive information Does Patient Have a Medical Advance Directive?: Yes, Type of Advance Directive: [place];Living will, Does patient want to make changes to medical advance directive?: No - Patient declined  Allergies  Allergen Reactions  . Allopurinol Other (See Comments)    Whelps on body and sores in mouth     Chief Complaint  Patient presents with  . Medical Management of Chronic Issues    Pt is being seen for a 3 month routine visit. Pt reports no concerns today.      HPI: Patient is a 76 y.o. male seen in the office today for routine follow up.  Pt with hx of gout, DM, CKD, htn, a fib, venous insufficiency, hyperlipidemia, chronic back pain, obesity and others.   DM- at last visit discussed increasing metformin from 500 BID to 1000 mg BID, also taking jardiance   Gout- uric acid has improved but not at goal. Not taking allopurinol due to reaction. This was added to allergy list. No recent gout flares.   Edema- stopped lasix at last visit. No edema  A fib-on eliquis, no palpitations, no abnormal bleeding.   Feel good, would like to lose weight.  Knows he eats poorly.  Unable to walk because of hip and back pain.  Used to work on concrete and does not feel like anything will help his pain.  Has thought about surgery but does not want to go back to a hospital.  Review of Systems:  Review of Systems  Constitutional: Negative for chills, fever and weight loss.  HENT: Negative for tinnitus.   Respiratory: Negative for cough, sputum production and shortness of breath.   Cardiovascular: Negative for chest pain, palpitations and leg swelling.  Gastrointestinal: Negative for abdominal pain, constipation, diarrhea and heartburn.  Genitourinary: Negative for dysuria, frequency and urgency.  Musculoskeletal: Positive for back pain and joint pain.  Negative for falls and myalgias.  Skin: Negative.   Neurological: Negative for dizziness and headaches.  Psychiatric/Behavioral: Negative for depression and memory loss. The patient does not have insomnia.     Past Medical History:  Diagnosis Date  . Chronic kidney disease, stage II (mild)   . Depression   . Diabetes mellitus without complication ([place])   . Essential hypertension, malignant   . Gout, unspecified   . Hyperlipidemia   . Lumbago   . Obesity, unspecified   . Pain in joint, pelvic region and thigh   . Special screening for malignant neoplasm of prostate    Past Surgical History:  Procedure Laterality Date  . APPENDECTOMY    . CYST EXCISION  1998   back  . 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 His smokeless tobacco use includes chew. He reports that he drinks about 3.0 oz of alcohol per week. He reports that he does not use drugs.  Family History  Problem Relation Age of Onset  . Heart Problems Father   . Heart disease Brother   . Diabetes Brother   . Diabetes Brother   . Heart Problems Mother   . Pneumonia Maternal Grandfather     Medications:   Medication List        Accurate as of 02/19/17  1:22 PM. Always use your most recent med list.  apixaban 5 MG Tabs tablet Commonly known as:  ELIQUIS One twice daily to reduce the risk of stroke associated with atrial fibrillation   doxazosin 8 MG tablet Commonly known as:  CARDURA Take one tablet by mouth once daily for blood pressure   empagliflozin 25 MG Tabs tablet Commonly known as:  JARDIANCE Take one each morning to control diabetes   HYDROcodone-acetaminophen 10-325 MG tablet Commonly known as:  NORCO Take 1 tablet by mouth every 6 (six) hours as needed (pain).   metFORMIN 1000 MG tablet Commonly known as:  GLUCOPHAGE Take 1 tablet (1,000 mg total) by mouth 2 (two) times daily with a meal.     metoprolol tartrate 50 MG tablet Commonly known as:  LOPRESSOR take 1 tablet by mouth twice a day TO CONTROL BLOOD PRESSURE AND HEART RHYTHM   multivitamin-iron-minerals-folic acid chewable tablet   Olmesartan-Amlodipine-HCTZ 40-10-25 MG Tabs take 1 tablet by mouth once daily for blood pressure        Physical Exam:  Vitals:   02/19/17 1307  BP: 118/72  Pulse: 78  Resp: 18  Temp: (!) 97.5 F (36.4 C)  TempSrc: Oral  SpO2: 96%  Weight: 240 lb 12.8 oz (109.2 kg)  Height: _0  (1.727 m)   Body mass index is 36.61 kg/m.  Physical Exam  Constitutional: He is oriented to person, place, and time. He appears well-developed and well-nourished. No distress.  overweight  HENT:  Head: Normocephalic and atraumatic.  Nose: Nose normal.  Mouth/Throat: Oropharynx is clear and moist. No oropharyngeal exudate.  Eyes: Conjunctivae and EOM are normal. Pupils are equal, round, and reactive to light.  Neck: Normal range of motion. Neck supple.  Cardiovascular: Normal rate and normal heart sounds. An irregularly irregular rhythm present. Exam reveals no gallop and no friction rub.  No murmur heard. Pulmonary/Chest: Effort normal and breath sounds normal.  Abdominal: Soft. Bowel sounds are normal. He exhibits no distension. There is no tenderness.  Musculoskeletal: Normal range of motion. He exhibits no edema or tenderness.  Neurological: He is alert and oriented to person, place, and time. He has normal reflexes. No cranial nerve deficit. Coordination normal.  Skin: Skin is warm and dry.  Psychiatric: He has a normal mood and affect. His behavior is normal. Judgment and thought content normal.   Labs reviewed: Basic Metabolic Panel: Recent Labs    03/27/16 1141  07/17/16 1100 11/14/16 0216 02/15/17 1338  NA  --    < > 137 142 139  K  --    < > 3.7 4.2 3.5  CL  --    < > 96* 97* 97*  CO2  --    < > 30 33* 32  GLUCOSE  --    < > 195* 189* 203*  BUN  --    < > 37* 27* 20   CREATININE  --    < > 1.83* 1.62* 1.61*  CALCIUM  --    < > 9.3 10.0 10.0  TSH 1.63  --   --   --   --    < > = values in this interval not displayed.   Liver Function Tests: Recent Labs    02/15/17 1338  AST 10  ALT 10  BILITOT 0.9  PROT 6.5   No results for input(s): LIPASE, AMYLASE in the last 8760 hours. No results for input(s): AMMONIA in the last 8760 hours. CBC: Recent Labs    03/27/16 1141 02/15/17 1338  WBC 9.1 8.4  NEUTROABS 6,734 5,972  HGB 15.0 13.7  HCT 45.7 41.3  MCV 91.4 85.7  PLT 232 255   Lipid Panel: Recent Labs    07/17/16 1100  CHOL 168  HDL 51  LDLCALC 106*  TRIG 57  CHOLHDL 3.3   TSH: Recent Labs    03/27/16 1141  TSH 1.63   A1C: Lab Results  Component Value Date   HGBA1C 9.3 (H) 02/15/2017     Assessment/Plan 1. Hypertension, unspecified type Controlled on current regimen. Will cont at this time.   2. DM type 2, uncontrolled, with renal complications (HCC) -X8B worse, only using metformin once daily, educated on these directions, also to use jardiance. May need to start additional medication if A1c does not improve. Pt reports he is willing to work on diet as it has gotten worse.  - Hemoglobin A1c; Future - BMP with eGFR; Future  3. Gout, unspecified cause, unspecified chronicity, unspecified site -no recent flare but uric acid level elevated, will start uloric at this time and follow up level prior to next visit.  - febuxostat (ULORIC) 40 MG tablet; Take 1 tablet (40 mg total) daily by mouth.  Dispense: 30 tablet; Refill: 3 - Uric acid; Future  4. Pure hypercholesterolemia -diet information given will follow up lipids on next lab  - Lipid Panel; Future  5. Class 2 severe obesity due to excess calories with serious comorbidity and body mass index (BMI) of 36.0 to 36.9 in adult [facility]) -does not wish to see nutritionist. Encouraged weight loss with activity and diet modifications. Unable to do exercise due to chronic back and  hip pain. Information provided on diet.   6. Chronic midline low back pain without sciatica -stable and chronic. Uses hydrocodone-apap for pain relief.   Next appt:  Carlos American. Harle Battiest  [facility] [phone] 8 am - 5 pm) [phone] (after hours)

## 2017-02-19 NOTE — Patient Instructions (Signed)
Start taking metformin 1000 mg by mouth TWICE DAILY with breakfast and Supper  Add Uloric for gout.   DASH Eating Plan DASH stands for "Dietary Approaches to Stop Hypertension." The DASH eating plan is a healthy eating plan that has been shown to reduce high blood pressure (hypertension). It may also reduce your risk for type 2 diabetes, heart disease, and stroke. The DASH eating plan may also help with weight loss. What are tips for following this plan? General guidelines  Avoid eating more than 2,300 mg (milligrams) of salt (sodium) a day. If you have hypertension, you may need to reduce your sodium intake to 1,500 mg a day.  Limit alcohol intake to no more than 1 drink a day for nonpregnant women and 2 drinks a day for men. One drink equals 12 oz of beer, 5 oz of wine, or 1 oz of hard liquor.  Work with your health care provider to maintain a healthy body weight or to lose weight. Ask what an ideal weight is for you.  Get at least 30 minutes of exercise that causes your heart to beat faster (aerobic exercise) most days of the week. Activities may include walking, swimming, or biking.  Work with your health care provider or diet and nutrition specialist (dietitian) to adjust your eating plan to your individual calorie needs. Reading food labels  Check food labels for the amount of sodium per serving. Choose foods with less than 5 percent of the Daily Value of sodium. Generally, foods with less than 300 mg of sodium per serving fit into this eating plan.  To find whole grains, look for the word "whole" as the first word in the ingredient list. Shopping  Buy products labeled as "low-sodium" or "no salt added."  Buy fresh foods. Avoid canned foods and premade or frozen meals. Cooking  Avoid adding salt when cooking. Use salt-free seasonings or herbs instead of table salt or sea salt. Check with your health care provider or pharmacist before using salt substitutes.  Do not fry foods.  Cook foods using healthy methods such as baking, boiling, grilling, and broiling instead.  Cook with heart-healthy oils, such as olive, canola, soybean, or sunflower oil. Meal planning   Eat a balanced diet that includes: ? 5 or more servings of fruits and vegetables each day. At each meal, try to fill half of your plate with fruits and vegetables. ? Up to 6-8 servings of whole grains each day. ? Less than 6 oz of lean meat, poultry, or fish each day. A 3-oz serving of meat is about the same size as a deck of cards. One egg equals 1 oz. ? 2 servings of low-fat dairy each day. ? A serving of nuts, seeds, or beans 5 times each week. ? Heart-healthy fats. Healthy fats called Omega-3 fatty acids are found in foods such as flaxseeds and coldwater fish, like sardines, salmon, and mackerel.  Limit how much you eat of the following: ? Canned or prepackaged foods. ? Food that is high in trans fat, such as fried foods. ? Food that is high in saturated fat, such as fatty meat. ? Sweets, desserts, sugary drinks, and other foods with added sugar. ? Full-fat dairy products.  Do not salt foods before eating.  Try to eat at least 2 vegetarian meals each week.  Eat more home-cooked food and less restaurant, buffet, and fast food.  When eating at a restaurant, ask that your food be prepared with less salt or no salt, if  possible. What foods are recommended? The items listed may not be a complete list. Talk with your dietitian about what dietary choices are best for you. Grains Whole-grain or whole-wheat bread. Whole-grain or whole-wheat pasta. Brown rice. [place]. Bulgur. Whole-grain and low-sodium cereals. Pita bread. Low-fat, low-sodium crackers. Whole-wheat flour tortillas. Vegetables Fresh or frozen vegetables (raw, steamed, roasted, or grilled). Low-sodium or reduced-sodium tomato and vegetable juice. Low-sodium or reduced-sodium tomato sauce and tomato paste. Low-sodium or reduced-sodium  canned vegetables. Fruits All fresh, dried, or frozen fruit. Canned fruit in natural juice (without added sugar). Meat and other protein foods Skinless chicken or [place]. Ground chicken or [place]. Pork with fat trimmed off. Fish and seafood. Egg whites. Dried beans, peas, or lentils. Unsalted nuts, nut butters, and seeds. Unsalted canned beans. Lean cuts of beef with fat trimmed off. Low-sodium, lean deli meat. Dairy Low-fat (1%) or fat-free (skim) milk. Fat-free, low-fat, or reduced-fat cheeses. Nonfat, low-sodium ricotta or cottage cheese. Low-fat or nonfat yogurt. Low-fat, low-sodium cheese. Fats and oils Soft margarine without trans fats. Vegetable oil. Low-fat, reduced-fat, or light mayonnaise and salad dressings (reduced-sodium). Canola, safflower, olive, soybean, and sunflower oils. Avocado. Seasoning and other foods Herbs. Spices. Seasoning mixes without salt. Unsalted popcorn and pretzels. Fat-free sweets. What foods are not recommended? The items listed may not be a complete list. Talk with your dietitian about what dietary choices are best for you. Grains Baked goods made with fat, such as croissants, muffins, or some breads. Dry pasta or rice meal packs. Vegetables Creamed or fried vegetables. Vegetables in a cheese sauce. Regular canned vegetables (not low-sodium or reduced-sodium). Regular canned tomato sauce and paste (not low-sodium or reduced-sodium). Regular tomato and vegetable juice (not low-sodium or reduced-sodium). Angie Fava. Olives. Fruits Canned fruit in a light or heavy syrup. Fried fruit. Fruit in cream or butter sauce. Meat and other protein foods Fatty cuts of meat. Ribs. Fried meat. Berniece Salines. Sausage. Bologna and other processed lunch meats. Salami. Fatback. Hotdogs. Bratwurst. Salted nuts and seeds. Canned beans with added salt. Canned or smoked fish. Whole eggs or egg yolks. Chicken or [place] with skin. Dairy Whole or 2% milk, cream, and half-and-half. Whole or  full-fat cream cheese. Whole-fat or sweetened yogurt. Full-fat cheese. Nondairy creamers. Whipped toppings. Processed cheese and cheese spreads. Fats and oils Butter. Stick margarine. Lard. Shortening. Ghee. Bacon fat. Tropical oils, such as coconut, palm kernel, or palm oil. Seasoning and other foods Salted popcorn and pretzels. Onion salt, garlic salt, seasoned salt, table salt, and sea salt. Worcestershire sauce. Tartar sauce. Barbecue sauce. Teriyaki sauce. Soy sauce, including reduced-sodium. Steak sauce. Canned and packaged gravies. Fish sauce. Oyster sauce. Cocktail sauce. Horseradish that you find on the shelf. Ketchup. Mustard. Meat flavorings and tenderizers. Bouillon cubes. Hot sauce and Tabasco sauce. Premade or packaged marinades. Premade or packaged taco seasonings. Relishes. Regular salad dressings. Where to find more information:  National Heart, Lung, and [place]: https://wilson-eaton.com/  American Heart Association: www.heart.org Summary  The DASH eating plan is a healthy eating plan that has been shown to reduce high blood pressure (hypertension). It may also reduce your risk for type 2 diabetes, heart disease, and stroke.  With the DASH eating plan, you should limit salt (sodium) intake to 2,300 mg a day. If you have hypertension, you may need to reduce your sodium intake to 1,500 mg a day.  When on the DASH eating plan, aim to eat more fresh fruits and vegetables, whole grains, lean proteins, low-fat dairy, and heart-healthy fats.  Work with your health care provider or diet and nutrition specialist (dietitian) to adjust your eating plan to your individual calorie needs. This information is not intended to replace advice given to you by your health care provider. Make sure you discuss any questions you have with your health care provider. Document Released: 03/23/2011 Document Revised: 03/27/2016 Document Reviewed: 03/27/2016 Elsevier Interactive Patient Education  2017  [employer].

## 2017-02-23 ENCOUNTER — Telehealth: Payer: Self-pay

## 2017-02-23 NOTE — Telephone Encounter (Signed)
PA for uloric 40 mg tablets has been approved through 04/16/2018.    Patient and pharmacy have been notified.

## 2017-02-23 NOTE — Telephone Encounter (Signed)
Incoming fax received on Wednesday for PA-Uloric. I attempted to complete this prior authorization twice vis covermymeds and the information would not process.    I called yesterday around 4:30pm to initiate PA via phone ([phone]), fax received this morning, completed, and faxed back. Awaiting response from insurance company

## 2017-03-07 ENCOUNTER — Telehealth: Payer: Self-pay

## 2017-03-07 NOTE — Telephone Encounter (Signed)
I called patient to see if he would be interested in taking lipitor 20 mg daily to help lower his cholesterol. Patient stated that he was not interested in taking a statin due to past experience. Patient believes that he is already taking a cholesterol medication because he is taking olmesartan-amlodipine-HCTZ 40-10-25 mg. I explained that this specific medication is for treatment of hypertension but patient would not be swayed in his opinion. I confirmed with Dr. Eulas Post that this medication is not used to treat hyperlipidemia.

## 2017-03-07 NOTE — Telephone Encounter (Signed)
Noted  

## 2017-03-16 ENCOUNTER — Other Ambulatory Visit: Payer: Self-pay | Admitting: *Deleted

## 2017-03-16 DIAGNOSIS — M545 Low back pain, unspecified: Secondary | ICD-10-CM

## 2017-03-16 DIAGNOSIS — G8929 Other chronic pain: Secondary | ICD-10-CM

## 2017-03-16 MED ORDER — HYDROCODONE-ACETAMINOPHEN 10-325 MG PO TABS: 1.0000 | ORAL_TABLET | Freq: Four times a day (QID) | ORAL | 0 refills | Status: DC | PRN

## 2017-03-16 NOTE — Telephone Encounter (Signed)
Patient requested and will pick up [place] Database Verified.  

## 2017-03-26 ENCOUNTER — Telehealth: Payer: Self-pay | Admitting: Nurse Practitioner

## 2017-03-26 ENCOUNTER — Other Ambulatory Visit: Payer: Self-pay | Admitting: *Deleted

## 2017-03-26 DIAGNOSIS — N182 Chronic kidney disease, stage 2 (mild): Principal | ICD-10-CM

## 2017-03-26 DIAGNOSIS — IMO0002 Reserved for concepts with insufficient information to code with codable children: Secondary | ICD-10-CM

## 2017-03-26 DIAGNOSIS — E1165 Type 2 diabetes mellitus with hyperglycemia: Principal | ICD-10-CM

## 2017-03-26 DIAGNOSIS — E1122 Type 2 diabetes mellitus with diabetic chronic kidney disease: Secondary | ICD-10-CM

## 2017-03-26 MED ORDER — METFORMIN HCL 1000 MG PO TABS: 1000.0000 mg | ORAL_TABLET | Freq: Two times a day (BID) | ORAL | 3 refills | Status: DC

## 2017-03-26 NOTE — Telephone Encounter (Signed)
I called the patient to schedule his AWV, but there was no answer and the voicemail wasn't set up. VDM (DD)

## 2017-03-26 NOTE — Telephone Encounter (Signed)
[facility] 

## 2017-04-03 ENCOUNTER — Telehealth: Payer: Self-pay | Admitting: *Deleted

## 2017-04-03 NOTE — Telephone Encounter (Signed)
How long has he been having symptoms? an antibiotic will make his stomach virus symptoms worse and if he has not had symptoms for over 7 days would recommend supportive care Would recommend mucinex DM by mouth twice daily with full glass of water for 7 days Plain nasal saline spray throughout the day as needed for nasal congestion  May use tylenol 325 mg 2 tablets every 6 hours as needed aches and pains or sore throat humidifier in the home to help with the dry air Keep well hydrated- to use low sugar sports drink, can also dilute them and increase water as well

## 2017-04-03 NOTE — Telephone Encounter (Signed)
Patient called and stated that he has Chest and Head Congestion and Stomach Virus. Patient stated he cannot come in due to the Stomach Virus, Nausea and Diarrhea. Patient is wanting a antibiotic to help with the congestion. Stated he has a low grade fever and coughing up green mucus. Stated that he has tired OTC Mucinex and NyQuil and Increased his fluids with no relief. Please Advise.

## 2017-04-03 NOTE — Telephone Encounter (Signed)
Patient notified and agreed. Stated that he will keep taking the mucinex and increase his fluids. Will call if symptoms change.

## 2017-04-16 ENCOUNTER — Other Ambulatory Visit: Payer: Self-pay | Admitting: *Deleted

## 2017-04-16 DIAGNOSIS — G8929 Other chronic pain: Secondary | ICD-10-CM

## 2017-04-16 DIAGNOSIS — M545 Low back pain, unspecified: Secondary | ICD-10-CM

## 2017-04-16 MED ORDER — HYDROCODONE-ACETAMINOPHEN 10-325 MG PO TABS: 1.0000 | ORAL_TABLET | Freq: Four times a day (QID) | ORAL | 0 refills | Status: DC | PRN

## 2017-04-16 NOTE — Telephone Encounter (Signed)
Patient called requested Refill on Hydrocodone.  [place] Database Verified.  Pharmacy Confirmed Pended Rx and sent to [facility] for approval.

## 2017-04-23 ENCOUNTER — Other Ambulatory Visit: Payer: Self-pay | Admitting: *Deleted

## 2017-04-23 MED ORDER — OLMESARTAN-AMLODIPINE-HCTZ 40-10-25 MG PO TABS: ORAL_TABLET | ORAL | 1 refills | Status: DC

## 2017-04-23 NOTE — Telephone Encounter (Signed)
[facility] 

## 2017-04-30 ENCOUNTER — Other Ambulatory Visit: Payer: Self-pay

## 2017-04-30 DIAGNOSIS — M109 Gout, unspecified: Secondary | ICD-10-CM

## 2017-05-17 ENCOUNTER — Other Ambulatory Visit: Payer: Medicare Other

## 2017-05-18 ENCOUNTER — Other Ambulatory Visit: Payer: Self-pay | Admitting: *Deleted

## 2017-05-18 DIAGNOSIS — M545 Low back pain: Principal | ICD-10-CM

## 2017-05-18 DIAGNOSIS — G8929 Other chronic pain: Secondary | ICD-10-CM

## 2017-05-18 MED ORDER — HYDROCODONE-ACETAMINOPHEN 10-325 MG PO TABS: 1.0000 | ORAL_TABLET | Freq: Four times a day (QID) | ORAL | 0 refills | Status: DC | PRN

## 2017-05-18 NOTE — Telephone Encounter (Signed)
Patient requested. [place]CCSRS Database Verified Pharmacy Confirmed Pended Rx and sent to [facility] for approval.  

## 2017-05-21 ENCOUNTER — Other Ambulatory Visit: Payer: Medicare Other

## 2017-05-21 DIAGNOSIS — M109 Gout, unspecified: Secondary | ICD-10-CM

## 2017-05-21 DIAGNOSIS — E7800 Pure hypercholesterolemia, unspecified: Secondary | ICD-10-CM | POA: Diagnosis not present

## 2017-05-21 DIAGNOSIS — E1165 Type 2 diabetes mellitus with hyperglycemia: Secondary | ICD-10-CM | POA: Diagnosis not present

## 2017-05-21 DIAGNOSIS — IMO0002 Reserved for concepts with insufficient information to code with codable children: Secondary | ICD-10-CM

## 2017-05-21 DIAGNOSIS — E1129 Type 2 diabetes mellitus with other diabetic kidney complication: Secondary | ICD-10-CM

## 2017-05-22 ENCOUNTER — Encounter: Payer: Self-pay | Admitting: Nurse Practitioner

## 2017-05-22 ENCOUNTER — Ambulatory Visit (INDEPENDENT_AMBULATORY_CARE_PROVIDER_SITE_OTHER): Payer: Medicare Other | Admitting: Nurse Practitioner

## 2017-05-22 VITALS — BP 144/80 | HR 100 | Temp 98.1°F | Ht 68.0 in | Wt 219.0 lb

## 2017-05-22 DIAGNOSIS — IMO0002 Reserved for concepts with insufficient information to code with codable children: Secondary | ICD-10-CM

## 2017-05-22 DIAGNOSIS — G8929 Other chronic pain: Secondary | ICD-10-CM | POA: Diagnosis not present

## 2017-05-22 DIAGNOSIS — N183 Chronic kidney disease, stage 3 unspecified: Secondary | ICD-10-CM

## 2017-05-22 DIAGNOSIS — I10 Essential (primary) hypertension: Secondary | ICD-10-CM | POA: Diagnosis not present

## 2017-05-22 DIAGNOSIS — E7800 Pure hypercholesterolemia, unspecified: Secondary | ICD-10-CM | POA: Diagnosis not present

## 2017-05-22 DIAGNOSIS — M545 Low back pain: Secondary | ICD-10-CM

## 2017-05-22 DIAGNOSIS — E0822 Diabetes mellitus due to underlying condition with diabetic chronic kidney disease: Secondary | ICD-10-CM | POA: Diagnosis not present

## 2017-05-22 DIAGNOSIS — I481 Persistent atrial fibrillation: Secondary | ICD-10-CM | POA: Diagnosis not present

## 2017-05-22 DIAGNOSIS — I4819 Other persistent atrial fibrillation: Secondary | ICD-10-CM

## 2017-05-22 DIAGNOSIS — M109 Gout, unspecified: Secondary | ICD-10-CM | POA: Diagnosis not present

## 2017-05-22 DIAGNOSIS — E0865 Diabetes mellitus due to underlying condition with hyperglycemia: Secondary | ICD-10-CM | POA: Diagnosis not present

## 2017-05-22 DIAGNOSIS — Z6836 Body mass index (BMI) 36.0-36.9, adult: Secondary | ICD-10-CM

## 2017-05-22 LAB — URIC ACID: Uric Acid, Serum: 5.6 mg/dL (ref 4.0–8.0)

## 2017-05-22 LAB — LIPID PANEL
Cholesterol: 180 mg/dL (ref <200)
HDL: 44 mg/dL (ref >40)
LDL CHOLESTEROL (CALC): 114 mg/dL — AB
Non-HDL Cholesterol (Calc): 136 mg/dL (calc) — ABNORMAL HIGH (ref <130)
Total CHOL/HDL Ratio: 4.1 (calc) (ref <5.0)
Triglycerides: 114 mg/dL (ref <150)

## 2017-05-22 LAB — BASIC METABOLIC PANEL WITH GFR
BUN / CREAT RATIO: 13 (calc) (ref 6–22)
BUN: 21 mg/dL (ref 7–25)
CO2: 36 mmol/L — ABNORMAL HIGH (ref 20–32)
CREATININE: 1.56 mg/dL — AB (ref 0.70–1.18)
Calcium: 10 mg/dL (ref 8.6–10.3)
Chloride: 97 mmol/L — ABNORMAL LOW (ref 98–110)
GFR, EST AFRICAN AMERICAN: 50 mL/min/{1.73_m2} — AB (ref >=60)
GFR, EST NON AFRICAN AMERICAN: 43 mL/min/{1.73_m2} — AB (ref >=60)
Glucose, Bld: 179 mg/dL — ABNORMAL HIGH (ref 65–99)
Potassium: 4.4 mmol/L (ref 3.5–5.3)
Sodium: 139 mmol/L (ref 135–146)

## 2017-05-22 LAB — HEMOGLOBIN A1C
HEMOGLOBIN A1C: 8.4 %{Hb} — AB (ref <5.7)
MEAN PLASMA GLUCOSE: 194 (calc)
eAG (mmol/L): 10.8 (calc)

## 2017-05-22 MED ORDER — EMPAGLIFLOZIN 25 MG PO TABS: ORAL_TABLET | ORAL | 3 refills | Status: DC

## 2017-05-22 MED ORDER — ROSUVASTATIN CALCIUM 5 MG PO TABS: 5.0000 mg | ORAL_TABLET | Freq: Every day | ORAL | 2 refills | Status: DC

## 2017-05-22 NOTE — Patient Instructions (Addendum)
Good job on your weight loss!  Try to take metformin with food to see if this helps with feeling bad.  To start crestor 5 mg daily for cholesterol Continue to work on diet- A1c is better but not controlled- may need to add another medication if unable to tolerate the metformin twice daily  Take medication when you get home

## 2017-05-22 NOTE — Progress Notes (Signed)
Careteam: Patient Care Team: Lauree Chandler, NP as PCP - General (Geriatric Medicine)  Advanced Directive information Does Patient Have a Medical Advance Directive?: Yes, Type of Advance Directive: [place];Living will  Allergies  Allergen Reactions  . Allopurinol Other (See Comments)    Whelps on body and sores in mouth     Chief Complaint  Patient presents with  . Medical Management of Chronic Issues    3 month follow-up   . Medication Management    Did not take B/P medication today (forgot)      HPI: Patient is a 77 y.o. male seen in the office today for routine follow up.  Blood pressure at home 120s/70s HR in the 70s  Has had weight loss- stopped eating bread and weight went down from 241-219 lbs No exercise just diet changes. Hips hurt when he does any walking.   Pain in lower back and hips- using hydrocodone- apap- using every 6 hours using 3-4 a day. Occasional constipation. Getting only from [facility].  Takes all that is prescribed.   Gout- no recent flares- using uloric 40 mg daily   DM- A1c improved however not at goal. Metformin makes him feel bad,  Review of Systems:  Review of Systems  Constitutional: Negative for chills, fever and weight loss.  HENT: Negative for tinnitus.   Respiratory: Negative for cough, sputum production and shortness of breath.   Cardiovascular: Negative for chest pain, palpitations and leg swelling.  Gastrointestinal: Negative for abdominal pain, constipation, diarrhea and heartburn.  Genitourinary: Negative for dysuria, frequency and urgency.  Musculoskeletal: Positive for back pain and joint pain. Negative for falls and myalgias.  Skin: Negative.   Neurological: Negative for dizziness and headaches.  Psychiatric/Behavioral: Negative for depression and memory loss. The patient does not have insomnia.     Past Medical History:  Diagnosis Date  . Chronic kidney disease, stage II (mild)   . Depression   .  Diabetes mellitus without complication ([place])   . Essential hypertension, malignant   . Gout, unspecified   . Hyperlipidemia   . Lumbago   . Obesity, unspecified   . Pain in joint, pelvic region and thigh   . Special screening for malignant neoplasm of prostate    Past Surgical History:  Procedure Laterality Date  . APPENDECTOMY    . CYST EXCISION  1998   back  . 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 His smokeless tobacco use includes chew. He reports that he drinks about 3.0 oz of alcohol per week. He reports that he does not use drugs.  Family History  Problem Relation Age of Onset  . Heart Problems Father   . Heart disease Brother   . Diabetes Brother   . Diabetes Brother   . Heart Problems Mother   . Pneumonia Maternal Grandfather     Medications: Patient's Medications  New Prescriptions   No medications on file  Previous Medications   APIXABAN (ELIQUIS) 5 MG TABS TABLET    One twice daily to reduce the risk of stroke associated with atrial fibrillation   DOXAZOSIN (CARDURA) 8 MG TABLET    Take one tablet by mouth once daily for blood pressure   EMPAGLIFLOZIN (JARDIANCE) 25 MG TABS TABLET    Take one each morning to control diabetes   FEBUXOSTAT (ULORIC) 40 MG TABLET    Take 1 tablet (40  mg total) daily by mouth.   HYDROCODONE-ACETAMINOPHEN (NORCO) 10-325 MG TABLET    Take 1 tablet by mouth every 6 (six) hours as needed (pain).   METFORMIN (GLUCOPHAGE) 1000 MG TABLET    Take 1 tablet (1,000 mg total) by mouth 2 (two) times daily with a meal.   METOPROLOL TARTRATE (LOPRESSOR) 50 MG TABLET    take 1 tablet by mouth twice a day TO CONTROL BLOOD PRESSURE AND HEART RHYTHM   MULTIVITAMIN-IRON-MINERALS-FOLIC ACID (CENTRUM) CHEWABLE TABLET    Chew 1 tablet by mouth daily.   OLMESARTAN-AMLODIPINE-HCTZ 40-10-25 MG TABS    take 1 tablet by mouth once daily for blood pressure  Modified  Medications   No medications on file  Discontinued Medications   No medications on file     Physical Exam:  Vitals:   05/22/17 1307  BP: (!) 144/80  Pulse: 100  Temp: 98.1 F (36.7 C)  TempSrc: Oral  SpO2: 96%  Weight: 219 lb (99.3 kg)  Height: 5\' 8"  (1.727 m)   Body mass index is 33.3 kg/m.  Physical Exam  Constitutional: He is oriented to person, place, and time. He appears well-developed and well-nourished. No distress.  overweight  HENT:  Head: Normocephalic and atraumatic.  Nose: Nose normal.  Mouth/Throat: Oropharynx is clear and moist. No oropharyngeal exudate.  Eyes: Conjunctivae and EOM are normal. Pupils are equal, round, and reactive to light.  Neck: Normal range of motion. Neck supple.  Cardiovascular: Normal rate and normal heart sounds. An irregularly irregular rhythm present. Exam reveals no gallop and no friction rub.  No murmur heard. Pulmonary/Chest: Effort normal and breath sounds normal.  Abdominal: Soft. Bowel sounds are normal. He exhibits no distension. There is no tenderness.  Musculoskeletal: Normal range of motion. He exhibits no edema or tenderness.  Neurological: He is alert and oriented to person, place, and time. He has normal reflexes. No cranial nerve deficit. Coordination normal.  Skin: Skin is warm and dry.  Psychiatric: He has a normal mood and affect. His behavior is normal. Judgment and thought content normal.    Labs reviewed: Basic Metabolic Panel: Recent Labs    11/14/16 0216 02/15/17 1338 05/21/17 1400  NA 142 139 139  K 4.2 3.5 4.4  CL 97* 97* 97*  CO2 33* 32 36*  GLUCOSE 189* 203* 179*  BUN 27* 20 21  CREATININE 1.62* 1.61* 1.56*  CALCIUM 10.0 10.0 10.0   Liver Function Tests: Recent Labs    02/15/17 1338  AST 10  ALT 10  BILITOT 0.9  PROT 6.5   No results for input(s): LIPASE, AMYLASE in the last 8760 hours. No results for input(s): AMMONIA in the last 8760 hours. CBC: Recent Labs    02/15/17 1338    WBC 8.4  NEUTROABS 5,972  HGB 13.7  HCT 41.3  MCV 85.7  PLT 255   Lipid Panel: Recent Labs    07/17/16 1100 05/21/17 1400  CHOL 168 180  HDL 51 44  LDLCALC 106*  --   TRIG 57 114  CHOLHDL 3.3 4.1   TSH: No results for input(s): TSH in the last 8760 hours. A1C: Lab Results  Component Value Date   HGBA1C 8.4 (H) 05/21/2017     Assessment/Plan 1. Gout, unspecified cause, unspecified chronicity, unspecified site No recent flares, uric acid now at goal. To cont uloric daily  2. Hypertension, unspecified type Elevated today because he forgot medication, encouraged compliance with morning medications. Continue dietary changes and medication as prescribed.  3. Class 2 severe obesity due to excess calories with serious comorbidity and body mass index (BMI) of 36.0 to 36.9 in adult [facility]) -intentional weight loss due to diet modifications, to continue low carb, heart healthy diet.   4. Chronic midline low back pain without sciatica -continues on Hydrocodone-APAP   5. Pure hypercholesterolemia -not at goal despite dietary changes.  - rosuvastatin (CRESTOR) 5 MG tablet; Take 1 tablet (5 mg total) by mouth daily.  Dispense: 30 tablet; Refill: 2 - COMPLETE METABOLIC PANEL WITH GFR; Future - Lipid Panel; Future  6. CKD (chronic kidney disease) stage 3, GFR 30-59 ml/min (HCC) Stable, Encouraged proper hydration and to avoid NSAIDS (Aleve, Advil, Motrin, Ibuprofen). Stressed importance of proper blood pressure and blood sugar control.   7. Diabetes mellitus due to underlying condition, uncontrolled, with stage 3 chronic kidney disease, without long-term current use of insulin (HCC) -with dietary changes and weight loss. A1c has improved but not at goal. -discussed continuing dietary changes.  - only taking once daily and reports it makes him feel poorly, educated to take with food to see if this helps with symptoms and try to take twice daily with meals. -continues jardiance  daily  - empagliflozin (JARDIANCE) 25 MG TABS tablet; Take one each morning to control diabetes  Dispense: 90 tablet; Refill: 3 - Hemoglobin A1c; Future  8. Persistent atrial fibrillation (HCC) Rate controlled on metoprolol, continues on eliquis  -CBC; future  Next appt: 09/11/2017 with lab work prior to visit Jessica K. Harle Battiest  [facility] [phone] 8 am - 5 pm) [phone] (after hours)

## 2017-06-15 ENCOUNTER — Other Ambulatory Visit: Payer: Self-pay | Admitting: *Deleted

## 2017-06-15 DIAGNOSIS — M545 Low back pain, unspecified: Secondary | ICD-10-CM

## 2017-06-15 DIAGNOSIS — G8929 Other chronic pain: Secondary | ICD-10-CM

## 2017-06-15 MED ORDER — HYDROCODONE-ACETAMINOPHEN 10-325 MG PO TABS: 1.0000 | ORAL_TABLET | Freq: Four times a day (QID) | ORAL | 0 refills | Status: DC | PRN

## 2017-06-15 NOTE — Telephone Encounter (Signed)
Patient requested. [place]CCSRS Database Verified Pharmacy Confirmed Pended Rx and sent to [facility] for approval.  

## 2017-07-03 ENCOUNTER — Other Ambulatory Visit: Payer: Self-pay | Admitting: Nurse Practitioner

## 2017-07-03 DIAGNOSIS — M109 Gout, unspecified: Secondary | ICD-10-CM

## 2017-07-13 ENCOUNTER — Other Ambulatory Visit: Payer: Self-pay | Admitting: *Deleted

## 2017-07-13 DIAGNOSIS — M545 Low back pain, unspecified: Secondary | ICD-10-CM

## 2017-07-13 DIAGNOSIS — G8929 Other chronic pain: Secondary | ICD-10-CM

## 2017-07-13 MED ORDER — HYDROCODONE-ACETAMINOPHEN 10-325 MG PO TABS: 1.0000 | ORAL_TABLET | Freq: Four times a day (QID) | ORAL | 0 refills | Status: DC | PRN

## 2017-07-13 NOTE — Telephone Encounter (Signed)
Patient called requested narcotic Rx.  [place] Verified LR: 06/15/2017 Pharmacy Confirmed Pended Rx and sent to [facility] for approval.

## 2017-07-30 ENCOUNTER — Other Ambulatory Visit: Payer: Self-pay | Admitting: Nurse Practitioner

## 2017-07-30 DIAGNOSIS — N182 Chronic kidney disease, stage 2 (mild): Principal | ICD-10-CM

## 2017-07-30 DIAGNOSIS — E1122 Type 2 diabetes mellitus with diabetic chronic kidney disease: Secondary | ICD-10-CM

## 2017-07-30 DIAGNOSIS — E1165 Type 2 diabetes mellitus with hyperglycemia: Principal | ICD-10-CM

## 2017-07-30 DIAGNOSIS — IMO0002 Reserved for concepts with insufficient information to code with codable children: Secondary | ICD-10-CM

## 2017-08-06 ENCOUNTER — Other Ambulatory Visit: Payer: Self-pay | Admitting: Nurse Practitioner

## 2017-08-06 DIAGNOSIS — E7800 Pure hypercholesterolemia, unspecified: Secondary | ICD-10-CM

## 2017-08-14 ENCOUNTER — Other Ambulatory Visit: Payer: Self-pay | Admitting: *Deleted

## 2017-08-14 DIAGNOSIS — M545 Low back pain, unspecified: Secondary | ICD-10-CM

## 2017-08-14 DIAGNOSIS — G8929 Other chronic pain: Secondary | ICD-10-CM

## 2017-08-14 MED ORDER — HYDROCODONE-ACETAMINOPHEN 10-325 MG PO TABS: 1.0000 | ORAL_TABLET | Freq: Four times a day (QID) | ORAL | 0 refills | Status: DC | PRN

## 2017-08-14 NOTE — Telephone Encounter (Signed)
Patient requested [facility] Confirmed LR: 07/13/17 Pended Rx and sent to [facility] for approval.

## 2017-09-03 ENCOUNTER — Other Ambulatory Visit: Payer: Self-pay | Admitting: Nurse Practitioner

## 2017-09-11 ENCOUNTER — Other Ambulatory Visit: Payer: Medicare Other

## 2017-09-11 ENCOUNTER — Other Ambulatory Visit: Payer: Self-pay

## 2017-09-11 DIAGNOSIS — E0822 Diabetes mellitus due to underlying condition with diabetic chronic kidney disease: Secondary | ICD-10-CM | POA: Diagnosis not present

## 2017-09-11 DIAGNOSIS — IMO0002 Reserved for concepts with insufficient information to code with codable children: Secondary | ICD-10-CM

## 2017-09-11 DIAGNOSIS — I481 Persistent atrial fibrillation: Secondary | ICD-10-CM | POA: Diagnosis not present

## 2017-09-11 DIAGNOSIS — N183 Chronic kidney disease, stage 3 (moderate): Secondary | ICD-10-CM | POA: Diagnosis not present

## 2017-09-11 DIAGNOSIS — E0865 Diabetes mellitus due to underlying condition with hyperglycemia: Secondary | ICD-10-CM | POA: Diagnosis not present

## 2017-09-11 DIAGNOSIS — E7800 Pure hypercholesterolemia, unspecified: Secondary | ICD-10-CM | POA: Diagnosis not present

## 2017-09-11 DIAGNOSIS — I4819 Other persistent atrial fibrillation: Secondary | ICD-10-CM

## 2017-09-11 DIAGNOSIS — M545 Low back pain: Principal | ICD-10-CM

## 2017-09-11 DIAGNOSIS — G8929 Other chronic pain: Secondary | ICD-10-CM

## 2017-09-11 MED ORDER — HYDROCODONE-ACETAMINOPHEN 10-325 MG PO TABS: 1.0000 | ORAL_TABLET | Freq: Four times a day (QID) | ORAL | 0 refills | Status: DC | PRN

## 2017-09-11 NOTE — Telephone Encounter (Signed)
Opened in error

## 2017-09-11 NOTE — Telephone Encounter (Signed)
Patient requested a refill on hydrocodone. Rx was pended to provider for approval  after verifying last fill date, provider, and quantity on PMP [place]

## 2017-09-11 NOTE — Addendum Note (Signed)
Addended by: Denyse Amass on: 09/11/2017 01:46 PM   Modules accepted: Orders

## 2017-09-12 LAB — CBC WITH DIFFERENTIAL/PLATELET
Basophils Absolute: 41 cells/uL (ref 0–200)
Basophils Relative: 0.5 %
Eosinophils Absolute: 243 cells/uL (ref 15–500)
Eosinophils Relative: 3 %
HEMATOCRIT: 39.4 % (ref 38.5–50.0)
Hemoglobin: 13 g/dL — ABNORMAL LOW (ref 13.2–17.1)
LYMPHS ABS: 1304 {cells}/uL (ref 850–3900)
MCH: 29 pg (ref 27.0–33.0)
MCHC: 33 g/dL (ref 32.0–36.0)
MCV: 87.8 fL (ref 80.0–100.0)
MPV: 9.3 fL (ref 7.5–12.5)
Monocytes Relative: 9.2 %
NEUTROS PCT: 71.2 %
Neutro Abs: 5767 cells/uL (ref 1500–7800)
Platelets: 228 10*3/uL (ref 140–400)
RBC: 4.49 10*6/uL (ref 4.20–5.80)
RDW: 12.7 % (ref 11.0–15.0)
Total Lymphocyte: 16.1 %
WBC: 8.1 10*3/uL (ref 3.8–10.8)
WBCMIX: 745 {cells}/uL (ref 200–950)

## 2017-09-12 LAB — COMPLETE METABOLIC PANEL WITH GFR
AG Ratio: 1.7 (calc) (ref 1.0–2.5)
ALBUMIN MSPROF: 4 g/dL (ref 3.6–5.1)
ALKALINE PHOSPHATASE (APISO): 67 U/L (ref 40–115)
ALT: 11 U/L (ref 9–46)
AST: 13 U/L (ref 10–35)
BILIRUBIN TOTAL: 1.3 mg/dL — AB (ref 0.2–1.2)
BUN / CREAT RATIO: 15 (calc) (ref 6–22)
BUN: 24 mg/dL (ref 7–25)
CHLORIDE: 98 mmol/L (ref 98–110)
CO2: 31 mmol/L (ref 20–32)
CREATININE: 1.57 mg/dL — AB (ref 0.70–1.18)
Calcium: 10.2 mg/dL (ref 8.6–10.3)
GFR, EST AFRICAN AMERICAN: 49 mL/min/{1.73_m2} — AB (ref >=60)
GFR, Est Non African American: 42 mL/min/{1.73_m2} — ABNORMAL LOW (ref >=60)
GLUCOSE: 117 mg/dL — AB (ref 65–99)
Globulin: 2.4 g/dL (calc) (ref 1.9–3.7)
Potassium: 4 mmol/L (ref 3.5–5.3)
Sodium: 138 mmol/L (ref 135–146)
TOTAL PROTEIN: 6.4 g/dL (ref 6.1–8.1)

## 2017-09-12 LAB — LIPID PANEL
CHOLESTEROL: 147 mg/dL (ref <200)
HDL: 50 mg/dL (ref >40)
LDL Cholesterol (Calc): 78 mg/dL (calc)
Non-HDL Cholesterol (Calc): 97 mg/dL (calc) (ref <130)
TRIGLYCERIDES: 106 mg/dL (ref <150)
Total CHOL/HDL Ratio: 2.9 (calc) (ref <5.0)

## 2017-09-12 LAB — HEMOGLOBIN A1C
EAG (MMOL/L): 7.9 (calc)
Hgb A1c MFr Bld: 6.6 % of total Hgb — ABNORMAL HIGH (ref <5.7)
Mean Plasma Glucose: 143 (calc)

## 2017-09-14 ENCOUNTER — Encounter: Payer: Self-pay | Admitting: Nurse Practitioner

## 2017-09-14 ENCOUNTER — Ambulatory Visit (INDEPENDENT_AMBULATORY_CARE_PROVIDER_SITE_OTHER): Payer: Medicare Other | Admitting: Nurse Practitioner

## 2017-09-14 VITALS — BP 134/60 | HR 72 | Temp 98.0°F | Ht 68.0 in | Wt 228.0 lb

## 2017-09-14 DIAGNOSIS — E0865 Diabetes mellitus due to underlying condition with hyperglycemia: Secondary | ICD-10-CM | POA: Diagnosis not present

## 2017-09-14 DIAGNOSIS — N183 Chronic kidney disease, stage 3 unspecified: Secondary | ICD-10-CM

## 2017-09-14 DIAGNOSIS — M109 Gout, unspecified: Secondary | ICD-10-CM | POA: Diagnosis not present

## 2017-09-14 DIAGNOSIS — I10 Essential (primary) hypertension: Secondary | ICD-10-CM | POA: Diagnosis not present

## 2017-09-14 DIAGNOSIS — E66812 Obesity, class 2: Secondary | ICD-10-CM

## 2017-09-14 DIAGNOSIS — G8929 Other chronic pain: Secondary | ICD-10-CM | POA: Diagnosis not present

## 2017-09-14 DIAGNOSIS — E0822 Diabetes mellitus due to underlying condition with diabetic chronic kidney disease: Secondary | ICD-10-CM | POA: Diagnosis not present

## 2017-09-14 DIAGNOSIS — Z6836 Body mass index (BMI) 36.0-36.9, adult: Secondary | ICD-10-CM | POA: Diagnosis not present

## 2017-09-14 DIAGNOSIS — I4891 Unspecified atrial fibrillation: Secondary | ICD-10-CM

## 2017-09-14 DIAGNOSIS — M545 Low back pain, unspecified: Secondary | ICD-10-CM

## 2017-09-14 DIAGNOSIS — IMO0002 Reserved for concepts with insufficient information to code with codable children: Secondary | ICD-10-CM

## 2017-09-14 DIAGNOSIS — E7800 Pure hypercholesterolemia, unspecified: Secondary | ICD-10-CM

## 2017-09-14 MED ORDER — RIVAROXABAN 20 MG PO TABS: 20.0000 mg | ORAL_TABLET | Freq: Every day | ORAL | 1 refills | Status: DC

## 2017-09-14 NOTE — Progress Notes (Signed)
Careteam: Patient Care Team: Lauree Chandler, NP as PCP - General (Geriatric Medicine)  Advanced Directive information    Allergies  Allergen Reactions  . Allopurinol Other (See Comments)    Whelps on body and sores in mouth     Chief Complaint  Patient presents with  . Medical Management of Chronic Issues    3 month follow up and discuss labs     HPI: Patient is a 77 y.o. male seen in the office today for routine follow up.   Has completed healthcare POA and living will but no copy in epic- reports daughter and attorney have this.   Hyperlipidemia- started Crestor and been toleratng well.   DM- A1c improved to 6.6 from 8.4 he has started taking his medication at this time. Not made diet changes. Having to break metformin in half and taking them throughout the day and this resolves nausea. Also taking jardiance.   Chronic back, hip and knee pain. Using hydrocodone-apap PRN. Occasional constipation but mostly controlled on diet.   A fib- eliquis was prescribed but not taking due to bruising has not taken for a while. CHADs2 score of 4. Discussed risk of major stroke or MI with not taking anticoagulate. Using metoprolol for rate  Taking ASA unsure of dose but thinks it is 325 mg   Review of Systems:  Review of Systems  Constitutional: Negative for chills, fever and weight loss.  HENT: Negative for tinnitus.   Respiratory: Negative for cough, sputum production and shortness of breath.   Cardiovascular: Negative for chest pain, palpitations and leg swelling.  Gastrointestinal: Negative for abdominal pain, constipation, diarrhea and heartburn.  Genitourinary: Negative for dysuria, frequency and urgency.  Musculoskeletal: Positive for back pain and joint pain. Negative for falls and myalgias.  Skin: Negative.   Neurological: Negative for dizziness and headaches.  Psychiatric/Behavioral: Negative for depression and memory loss. The patient does not have insomnia.      Past Medical History:  Diagnosis Date  . Chronic kidney disease, stage II (mild)   . Depression   . Diabetes mellitus without complication ([place])   . Essential hypertension, malignant   . Gout, unspecified   . Hyperlipidemia   . Lumbago   . Obesity, unspecified   . Pain in joint, pelvic region and thigh   . Special screening for malignant neoplasm of prostate    Past Surgical History:  Procedure Laterality Date  . APPENDECTOMY    . CYST EXCISION  1998   back  . 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 His smokeless tobacco use includes chew. He reports that he drinks about 3.0 oz of alcohol per week. He reports that he does not use drugs.  Family History  Problem Relation Age of Onset  . Heart Problems Father   . Heart disease Brother   . Diabetes Brother   . Diabetes Brother   . Heart Problems Mother   . Pneumonia Maternal Grandfather     Medications: Patient's Medications  New Prescriptions   No medications on file  Previous Medications   APIXABAN (ELIQUIS) 5 MG TABS TABLET    One twice daily to reduce the risk of stroke associated with atrial fibrillation   DOXAZOSIN (CARDURA) 8 MG TABLET    TAKE 1 TABLET BY MOUTH ONCE DAILY FOR BLOOD PRESSURE   EMPAGLIFLOZIN (JARDIANCE) 25 MG TABS TABLET    Take one  each morning to control diabetes   HYDROCODONE-ACETAMINOPHEN (NORCO) 10-325 MG TABLET    Take 1 tablet by mouth every 6 (six) hours as needed (pain).   METFORMIN (GLUCOPHAGE) 1000 MG TABLET    TAKE 1 TABLET BY MOUTH TWICE DAILY WITH MEALS   METOPROLOL TARTRATE (LOPRESSOR) 50 MG TABLET    take 1 tablet by mouth twice a day TO CONTROL BLOOD PRESSURE AND HEART RHYTHM   MULTIVITAMIN-IRON-MINERALS-FOLIC ACID (CENTRUM) CHEWABLE TABLET    Chew 1 tablet by mouth daily.   OLMESARTAN-AMLODIPINE-HCTZ 40-10-25 MG TABS    take 1 tablet by mouth once daily for blood pressure   ROSUVASTATIN  (CRESTOR) 5 MG TABLET    TAKE 1 TABLET BY MOUTH ONCE DAILY   ULORIC 40 MG TABLET    TAKE 1 TABLET BY MOUTH ONCE DAILY  Modified Medications   No medications on file  Discontinued Medications   No medications on file     Physical Exam:  Vitals:   09/14/17 1119  BP: 134/60  Pulse: 72  Temp: 98 F (36.7 C)  SpO2: 96%  Weight: 228 lb (103.4 kg)  Height: 5\' 8"  (1.727 m)   Body mass index is 34.67 kg/m.  Physical Exam  Constitutional: He is oriented to person, place, and time. He appears well-developed and well-nourished. No distress.  overweight  HENT:  Head: Normocephalic and atraumatic.  Nose: Nose normal.  Mouth/Throat: Oropharynx is clear and moist. No oropharyngeal exudate.  Eyes: Pupils are equal, round, and reactive to light. Conjunctivae and EOM are normal.  Neck: Normal range of motion. Neck supple.  Cardiovascular: Normal rate and normal heart sounds. An irregularly irregular rhythm present. Exam reveals no gallop and no friction rub.  No murmur heard. Pulmonary/Chest: Effort normal and breath sounds normal.  Abdominal: Soft. Bowel sounds are normal. He exhibits no distension. There is no tenderness.  Musculoskeletal: Normal range of motion. He exhibits no edema or tenderness.  Neurological: He is alert and oriented to person, place, and time. He has normal reflexes. No cranial nerve deficit. Coordination normal.  Skin: Skin is warm and dry.  Psychiatric: He has a normal mood and affect. His behavior is normal. Judgment and thought content normal.    Labs reviewed: Basic Metabolic Panel: Recent Labs    02/15/17 1338 05/21/17 1400 09/11/17 1346  NA 139 139 138  K 3.5 4.4 4.0  CL 97* 97* 98  CO2 32 36* 31  GLUCOSE 203* 179* 117*  BUN 20 21 24   CREATININE 1.61* 1.56* 1.57*  CALCIUM 10.0 10.0 10.2   Liver Function Tests: Recent Labs    02/15/17 1338 09/11/17 1346  AST 10 13  ALT 10 11  BILITOT 0.9 1.3*  PROT 6.5 6.4   No results for input(s):  LIPASE, AMYLASE in the last 8760 hours. No results for input(s): AMMONIA in the last 8760 hours. CBC: Recent Labs    02/15/17 1338 09/11/17 1346  WBC 8.4 8.1  NEUTROABS 5,972 5,767  HGB 13.7 13.0*  HCT 41.3 39.4  MCV 85.7 87.8  PLT 255 228   Lipid Panel: Recent Labs    05/21/17 1400 09/11/17 1346  CHOL 180 147  HDL 44 50  LDLCALC 114* 78  TRIG 114 106  CHOLHDL 4.1 2.9   TSH: No results for input(s): TSH in the last 8760 hours. A1C: Lab Results  Component Value Date   HGBA1C 6.6 (H) 09/11/2017     Assessment/Plan 1. Atrial fibrillation, unspecified type ([place]) NOT currently taking eliquis due  to bruising however taking ASA possible 325 mg daily; educated ASA most likely causing bruising (still bruising even though he has been off eliquis for years) To decrease ASA to 81 mg daily and will add xarelto to reduce risk of MI or CVA. - rivaroxaban (XARELTO) 20 MG TABS tablet; Take 1 tablet (20 mg total) by mouth daily with supper.  Dispense: 30 tablet; Refill: 1 - 20 mg dosing based on calculated CrCl based on weight.   2. Hypertension, unspecified type - blood pressure stable, will cont current regimen   3. Class 2 severe obesity due to excess calories with serious comorbidity and body mass index (BMI) of 36.0 to 36.9 in adult [facility]) -weigh up since last visit, discussed working on diet modifications as prior to keep up with positive weight loss.   4. Pure hypercholesterolemia Improved on crestor, will cont current regimen.   5. Chronic midline low back pain without sciatica -controlled with hydrocodone-apap  6. CKD (chronic kidney disease) stage 3, GFR 30-59 ml/min (HCC) Encourage proper hydration and to avoid NSAIDS (Aleve, Advil, Motrin, Ibuprofen)   7. Gout, unspecified cause, unspecified chronicity, unspecified site - to continue uloric, no recent flares   Next appt: 3 months with lab work prior to [employer]. [place] [place] [facility] [phone]

## 2017-09-14 NOTE — Patient Instructions (Addendum)
To look at ASA that you are taking- to reduce ASA to 81 mg by mouth daily To start xarelto 20 mg by mouth daily to reduce the risk of major heart attack or stroke  To follow up in 3 months with Dr Eulas Post, lab work prior to appt

## 2017-09-19 ENCOUNTER — Other Ambulatory Visit: Payer: Self-pay

## 2017-09-19 NOTE — Patient Outreach (Signed)
[place] [facility]) Care Management  09/19/2017  [name] 20-Jul-1940 [id number]   Medication Adherence call to [facility] patient did not answer he is past due on Rosuvastatin 5 mg. [place] said patient pick up Rosuvastatin 5 mg on 09/12/17 for a 30 days supply. [name] is showing under [facility].for a Medication Adherence call.   [place] Management Direct Dial [phone]  Fax [phone] [email or web address]

## 2017-09-28 ENCOUNTER — Other Ambulatory Visit: Payer: Self-pay | Admitting: Nurse Practitioner

## 2017-10-11 ENCOUNTER — Other Ambulatory Visit: Payer: Self-pay | Admitting: *Deleted

## 2017-10-11 ENCOUNTER — Other Ambulatory Visit: Payer: Self-pay | Admitting: Nurse Practitioner

## 2017-10-11 DIAGNOSIS — G8929 Other chronic pain: Secondary | ICD-10-CM

## 2017-10-11 DIAGNOSIS — M545 Low back pain: Principal | ICD-10-CM

## 2017-10-11 DIAGNOSIS — E7800 Pure hypercholesterolemia, unspecified: Secondary | ICD-10-CM

## 2017-10-11 MED ORDER — HYDROCODONE-ACETAMINOPHEN 10-325 MG PO TABS: 1.0000 | ORAL_TABLET | Freq: Four times a day (QID) | ORAL | 0 refills | Status: DC | PRN

## 2017-10-11 NOTE — Telephone Encounter (Signed)
Patient requested [place] Verified LR: 09/11/17 Pharmacy Confirmed Pended Rx and sent to [facility] for approval.

## 2017-11-13 ENCOUNTER — Other Ambulatory Visit: Payer: Self-pay | Admitting: *Deleted

## 2017-11-13 DIAGNOSIS — G8929 Other chronic pain: Secondary | ICD-10-CM

## 2017-11-13 DIAGNOSIS — M545 Low back pain: Principal | ICD-10-CM

## 2017-11-13 MED ORDER — HYDROCODONE-ACETAMINOPHEN 10-325 MG PO TABS: 1.0000 | ORAL_TABLET | Freq: Four times a day (QID) | ORAL | 0 refills | Status: DC | PRN

## 2017-11-13 NOTE — Telephone Encounter (Signed)
Patient requested refill [place] Verified LR: 10/11/2017 Pharmacy Confirmed Pended Rx and sent to Dr. Eulas Post for approval. (Jessica's Patient)

## 2017-12-13 ENCOUNTER — Other Ambulatory Visit: Payer: Self-pay | Admitting: *Deleted

## 2017-12-13 DIAGNOSIS — G8929 Other chronic pain: Secondary | ICD-10-CM

## 2017-12-13 DIAGNOSIS — M545 Low back pain: Principal | ICD-10-CM

## 2017-12-13 MED ORDER — HYDROCODONE-ACETAMINOPHEN 10-325 MG PO TABS: 1.0000 | ORAL_TABLET | Freq: Four times a day (QID) | ORAL | 0 refills | Status: DC | PRN

## 2017-12-13 NOTE — Telephone Encounter (Signed)
Patient called and requested refill [facility] Verified LR: 11/13/2017 Pharmacy Confirmed Pended Rx and sent to [facility] for approval. (Jessica's Patient)

## 2017-12-14 ENCOUNTER — Other Ambulatory Visit: Payer: Self-pay | Admitting: Nurse Practitioner

## 2017-12-24 ENCOUNTER — Other Ambulatory Visit: Payer: Medicare Other

## 2017-12-24 DIAGNOSIS — I4891 Unspecified atrial fibrillation: Secondary | ICD-10-CM | POA: Diagnosis not present

## 2017-12-24 DIAGNOSIS — E7800 Pure hypercholesterolemia, unspecified: Secondary | ICD-10-CM

## 2017-12-24 DIAGNOSIS — IMO0002 Reserved for concepts with insufficient information to code with codable children: Secondary | ICD-10-CM

## 2017-12-24 DIAGNOSIS — E0822 Diabetes mellitus due to underlying condition with diabetic chronic kidney disease: Secondary | ICD-10-CM

## 2017-12-24 DIAGNOSIS — E0865 Diabetes mellitus due to underlying condition with hyperglycemia: Secondary | ICD-10-CM | POA: Diagnosis not present

## 2017-12-24 DIAGNOSIS — N183 Chronic kidney disease, stage 3 (moderate): Secondary | ICD-10-CM | POA: Diagnosis not present

## 2017-12-24 LAB — CBC WITH DIFFERENTIAL/PLATELET
Basophils Absolute: 51 cells/uL (ref 0–200)
Basophils Relative: 0.6 %
EOS PCT: 4.2 %
Eosinophils Absolute: 357 cells/uL (ref 15–500)
HEMATOCRIT: 27.9 % — AB (ref 38.5–50.0)
HEMOGLOBIN: 9.2 g/dL — AB (ref 13.2–17.1)
LYMPHS ABS: 1530 {cells}/uL (ref 850–3900)
MCH: 28.6 pg (ref 27.0–33.0)
MCHC: 33 g/dL (ref 32.0–36.0)
MCV: 86.6 fL (ref 80.0–100.0)
MPV: 9.7 fL (ref 7.5–12.5)
Monocytes Relative: 7.9 %
NEUTROS PCT: 69.3 %
Neutro Abs: 5891 cells/uL (ref 1500–7800)
Platelets: 229 10*3/uL (ref 140–400)
RBC: 3.22 10*6/uL — AB (ref 4.20–5.80)
RDW: 14.2 % (ref 11.0–15.0)
Total Lymphocyte: 18 %
WBC mixed population: 672 cells/uL (ref 200–950)
WBC: 8.5 10*3/uL (ref 3.8–10.8)

## 2017-12-24 LAB — COMPREHENSIVE METABOLIC PANEL
AG Ratio: 1.6 (calc) (ref 1.0–2.5)
ALT: 12 U/L (ref 9–46)
AST: 12 U/L (ref 10–35)
Albumin: 3.6 g/dL (ref 3.6–5.1)
Alkaline phosphatase (APISO): 58 U/L (ref 40–115)
BUN / CREAT RATIO: 14 (calc) (ref 6–22)
BUN: 23 mg/dL (ref 7–25)
CHLORIDE: 98 mmol/L (ref 98–110)
CO2: 34 mmol/L — ABNORMAL HIGH (ref 20–32)
CREATININE: 1.65 mg/dL — AB (ref 0.70–1.18)
Calcium: 9.8 mg/dL (ref 8.6–10.3)
GLOBULIN: 2.2 g/dL (ref 1.9–3.7)
GLUCOSE: 153 mg/dL — AB (ref 65–99)
Potassium: 3.8 mmol/L (ref 3.5–5.3)
Sodium: 139 mmol/L (ref 135–146)
Total Bilirubin: 0.7 mg/dL (ref 0.2–1.2)
Total Protein: 5.8 g/dL — ABNORMAL LOW (ref 6.1–8.1)

## 2017-12-24 LAB — LIPID PANEL
CHOLESTEROL: 135 mg/dL (ref <200)
HDL: 35 mg/dL — AB (ref >40)
LDL Cholesterol (Calc): 81 mg/dL (calc)
NON-HDL CHOLESTEROL (CALC): 100 mg/dL (ref <130)
Total CHOL/HDL Ratio: 3.9 (calc) (ref <5.0)
Triglycerides: 95 mg/dL (ref <150)

## 2017-12-24 LAB — HEMOGLOBIN A1C
EAG (MMOL/L): 8.5 (calc)
Hgb A1c MFr Bld: 7 % of total Hgb — ABNORMAL HIGH (ref <5.7)
Mean Plasma Glucose: 154 (calc)

## 2017-12-28 ENCOUNTER — Ambulatory Visit: Payer: Medicare Other | Admitting: Internal Medicine

## 2018-01-01 ENCOUNTER — Ambulatory Visit (INDEPENDENT_AMBULATORY_CARE_PROVIDER_SITE_OTHER): Payer: Medicare Other | Admitting: Internal Medicine

## 2018-01-01 ENCOUNTER — Encounter: Payer: Self-pay | Admitting: Internal Medicine

## 2018-01-01 VITALS — BP 128/78 | HR 70 | Temp 98.4°F | Ht 68.0 in | Wt 240.4 lb

## 2018-01-01 DIAGNOSIS — R600 Localized edema: Secondary | ICD-10-CM

## 2018-01-01 DIAGNOSIS — D649 Anemia, unspecified: Secondary | ICD-10-CM

## 2018-01-01 DIAGNOSIS — M109 Gout, unspecified: Secondary | ICD-10-CM

## 2018-01-01 DIAGNOSIS — E1169 Type 2 diabetes mellitus with other specified complication: Secondary | ICD-10-CM | POA: Diagnosis not present

## 2018-01-01 DIAGNOSIS — M25551 Pain in right hip: Secondary | ICD-10-CM

## 2018-01-01 DIAGNOSIS — E1159 Type 2 diabetes mellitus with other circulatory complications: Secondary | ICD-10-CM

## 2018-01-01 DIAGNOSIS — N183 Chronic kidney disease, stage 3 unspecified: Secondary | ICD-10-CM

## 2018-01-01 DIAGNOSIS — I480 Paroxysmal atrial fibrillation: Secondary | ICD-10-CM | POA: Diagnosis not present

## 2018-01-01 DIAGNOSIS — I10 Essential (primary) hypertension: Secondary | ICD-10-CM

## 2018-01-01 DIAGNOSIS — M25552 Pain in left hip: Secondary | ICD-10-CM

## 2018-01-01 DIAGNOSIS — R195 Other fecal abnormalities: Secondary | ICD-10-CM

## 2018-01-01 NOTE — Patient Instructions (Signed)
Continue current medications as ordered  Will call with xray results  Will call with GI referral for colonoscopy  Reduce carbs in diet and increase exercise as tolerated (chair exercises are good too)  Keep legs elevated when seated  Follow up in 4 mos with Janett Billow for DM, HTN, anemia, chronic pain. Fasting labs prior to appt

## 2018-01-01 NOTE — Progress Notes (Signed)
Patient ID: Douglas Soto, male   DOB: May 10, 1940, 77 y.o.   MRN: [id number]   Location:  [facility] OFFICE  Provider: DR Arletha Grippe  Code Status:  Goals of Care:  Advanced Directives 01/01/2018  Does Patient Have a Medical Advance Directive? No  Type of Advance Directive -  Does patient want to make changes to medical advance directive? -  Copy of Jolly in Chart? -     Chief Complaint  Patient presents with  . Medical Management of Chronic Issues    Pt is being seen for a 3 month routine visit.   . Depression    Score of 0  . Audit C Screening    Score of 4  . Health Maintenance    Pt is due for a DM foot exam    HPI: Patient is a 77 y.o. male seen today for medical management of chronic diseases.  He has never had a colonoscopy and has hx (+) cologuard in Aug 2018.  DM - BS not checked at home. A1c 7%; LDL 81  Hyperlipidemia - LDL at goal but not HDL; poor exercise tolerance; takes crestor. LDL 81; HDL 35  CKD - stage 3; Cr 1.65  Anemia - etiology unknown; await hemoccult test (provided kit in office today); Hgb 9.2  Gout - uric acid level 5.6; no recent attacks on uloric  HTN/PAF - BP stable on cardura and ; rate controlled on lopressor; takes xeralto daily.  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   Past Medical History:  Diagnosis Date  . Chronic kidney disease, stage II (mild)   . Depression   . Diabetes mellitus without complication ([place])   . Essential hypertension, malignant   . Gout, unspecified   . Hyperlipidemia   . Lumbago   . Obesity, unspecified   . Pain in joint, pelvic region and thigh   . Special screening for malignant neoplasm of prostate     Past Surgical History:  Procedure Laterality Date  . APPENDECTOMY    . CYST EXCISION  1998   back  . 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 His smokeless tobacco use includes chew. He reports that he drinks about 5.0 standard drinks of alcohol per week. He reports that he does not use drugs. Social History   Socioeconomic History  . Marital status: Widowed    Spouse name: Not on file  . Number of children: Not on file  . Years of education: Not on file  . Highest education level: Not on file  Occupational History  . Not on file  Social Needs  . Financial resource strain: Not on file  . Food insecurity:    Worry: Not on file    Inability: Not on file  . Transportation needs:    Medical: Not on file    Non-medical: Not on file  Tobacco Use  . Smoking status: Former Smoker    Last attempt to quit: 04/17/1972    Years since quitting: 45.7  . Smokeless tobacco: Current User    Types: Chew  Substance and Sexual Activity  . Alcohol use: Yes    Alcohol/week: 5.0 standard drinks    Types: 5 Cans of beer per week  . Drug use: No  . Sexual  activity: Not Currently  Lifestyle  . Physical activity:    Days per week: Not on file    Minutes per session: Not on file  . Stress: Not on file  Relationships  . Social connections:    Talks on phone: Not on file    Gets together: Not on file    Attends religious service: Not on file    Active member of club or organization: Not on file    Attends meetings of clubs or organizations: Not on file    Relationship status: Not on file  . Intimate partner violence:    Fear of current or ex partner: Not on file    Emotionally abused: Not on file    Physically abused: Not on file    Forced sexual activity: Not on file  Other Topics Concern  . Not on file  Social History Narrative   Widow   Former smoker stopped 1974, chews tobacco   Alcohol beer on weekends   Exercise  Walks dog       Family History  Problem Relation Age of Onset  . Heart Problems Father   . Heart disease Brother   . Diabetes Brother   . Diabetes Brother   .  Heart Problems Mother   . Pneumonia Maternal Grandfather     Allergies  Allergen Reactions  . Allopurinol Other (See Comments)    Whelps on body and sores in mouth     Outpatient Encounter Medications as of 01/01/2018  Medication Sig  . doxazosin (CARDURA) 8 MG tablet TAKE 1 TABLET BY MOUTH ONCE DAILY FOR BLOOD PRESSURE  . empagliflozin (JARDIANCE) 25 MG TABS tablet Take one each morning to control diabetes  . HYDROcodone-acetaminophen (NORCO) 10-325 MG tablet Take 1 tablet by mouth every 6 (six) hours as needed (pain).  . metFORMIN (GLUCOPHAGE) 1000 MG tablet TAKE 1 TABLET BY MOUTH TWICE DAILY WITH MEALS  . metoprolol tartrate (LOPRESSOR) 50 MG tablet TAKE 1 TABLET BY MOUTH TWICE DAILY TO CONTROL BLOOD PRESSURE AND HEART RHYTHM  . multivitamin-iron-minerals-folic acid (CENTRUM) chewable tablet Chew 1 tablet by mouth daily.  . Olmesartan-amLODIPine-HCTZ 40-10-25 MG TABS TAKE 1 TABLET BY MOUTH ONCE DAILY FOR BLOOD PRESSURE  . rivaroxaban (XARELTO) 20 MG TABS tablet Take 1 tablet (20 mg total) by mouth daily with supper.  . rosuvastatin (CRESTOR) 5 MG tablet TAKE 1 TABLET BY MOUTH ONCE DAILY  . ULORIC 40 MG tablet TAKE 1 TABLET BY MOUTH ONCE DAILY   No facility-administered encounter medications on file as of 01/01/2018.     Review of Systems:  Review of Systems  Constitutional: Positive for activity change and fatigue.  Cardiovascular: Positive for leg swelling.  Musculoskeletal: Positive for arthralgias and gait problem.  All other systems reviewed and are negative.   Health Maintenance  Topic Date Due  . FOOT EXAM  07/19/2017  . OPHTHALMOLOGY EXAM  03/15/2018 (Originally 01/24/1951)  . TETANUS/TDAP  03/15/2018 (Originally 06/15/2012)  . INFLUENZA VACCINE  01/15/2019 (Originally 11/15/2017)  . HEMOGLOBIN A1C  06/24/2018  . Fecal DNA (Cologuard)  11/29/2019  . PNA vac Low Risk Adult  Completed    Physical Exam: Vitals:   01/01/18 1416  BP: 128/78  Pulse: 70  Temp: 98.4 F  (36.9 C)  TempSrc: Oral  SpO2: 98%  Weight: 240 lb 6.4 oz (109 kg)  Height: 5' 8" (1.727 m)   Body mass index is 36.55 kg/m. Physical Exam  Constitutional: He is oriented to person, place, and time. He appears  well-developed and well-nourished.  HENT:  Mouth/Throat: Oropharynx is clear and moist.  MMM; no oral thrush  Eyes: Pupils are equal, round, and reactive to light. No scleral icterus.  Neck: Neck supple. Carotid bruit is not present. No thyromegaly present.  Cardiovascular: Normal rate, regular rhythm and intact distal pulses. Exam reveals no gallop and no friction rub.  Murmur (1/6 SEM) heard. +2 pitting BLE edema; no calf TTP; chronic venous stasis changes  Pulmonary/Chest: Effort normal and breath sounds normal. He has no wheezes. He has no rales. He exhibits no tenderness.  Reduced BS b/l but no w/r/r  Abdominal: Soft. Bowel sounds are normal. He exhibits distension. He exhibits no abdominal bruit, no pulsatile midline mass and no mass. There is no hepatomegaly. There is no tenderness. There is no rebound and no guarding.  obese  Musculoskeletal: He exhibits edema and tenderness.  Lymphadenopathy:    He has no cervical adenopathy.  Neurological: He is alert and oriented to person, place, and time. He has normal reflexes.  Skin: Skin is warm and dry. No rash noted.  Psychiatric: He has a normal mood and affect. His behavior is normal. Judgment and thought content normal.    Labs reviewed: Basic Metabolic Panel: Recent Labs    05/21/17 1400 09/11/17 1346 12/24/17 1335  NA 139 138 139  K 4.4 4.0 3.8  CL 97* 98 98  CO2 36* 31 34*  GLUCOSE 179* 117* 153*  BUN _0 CREATININE 1.56* 1.57* 1.65*  CALCIUM 10.0 10.2 9.8   Liver Function Tests: Recent Labs    02/15/17 1338 09/11/17 1346 12/24/17 1335  AST _1 ALT _2 BILITOT 0.9 1.3* 0.7  PROT 6.5 6.4 5.8*   No results for input(s): LIPASE, AMYLASE in the last 8760 hours. No results for  input(s): AMMONIA in the last 8760 hours. CBC: Recent Labs    02/15/17 1338 09/11/17 1346 12/24/17 1335  WBC 8.4 8.1 8.5  NEUTROABS 5,972 5,767 5,891  HGB 13.7 13.0* 9.2*  HCT 41.3 39.4 27.9*  MCV 85.7 87.8 86.6  PLT 255 228 229   Lipid Panel: Recent Labs    05/21/17 1400 09/11/17 1346 12/24/17 1335  CHOL 180 147 135  HDL 44 50 35*  LDLCALC 114* 78 81  TRIG 114 106 95  CHOLHDL 4.1 2.9 3.9   Lab Results  Component Value Date   HGBA1C 7.0 (H) 12/24/2017    Procedures since last visit: No results found.  Assessment/Plan   ICD-10-CM   1. Pain of both hip joints M25.551 DG Pelvis 1-2 Views   M25.552   2. Bilateral lower extremity edema R60.0   3. PAF (paroxysmal atrial fibrillation) (HCC) I48.0   4. CKD (chronic kidney disease) stage 3, GFR 30-59 ml/min (HCC) N18.3   5. Type 2 diabetes mellitus with other specified complication, without long-term current use of insulin (HCC) E11.69   6. Gout, unspecified cause, unspecified chronicity, unspecified site M10.9   7. Hypertension associated with diabetes ([place]) E11.59    I10   8. Positive colorectal cancer screening using Cologuard test R19.5 Ambulatory referral to Gastroenterology  9. Anemia, unspecified type D64.9 Ambulatory referral to Gastroenterology   Continue current medications as ordered  Will call with xray results  Will call with GI referral for colonoscopy  Reduce carbs in diet and increase exercise as tolerated (chair exercises are good too)  Keep legs elevated when seated  Follow up in 4 mos with Janett Billow for DM,  HTN, anemia, chronic pain. Fasting labs prior to appt (cmp, a1c, lipid panel, cbc)    Monica S. Perlie Gold  [facility] [address] [place], [place] [address] [phone] Cell (Monday-Friday 8 AM - 5 PM) [phone] After 5 PM and follow prompts

## 2018-01-02 ENCOUNTER — Encounter: Payer: Self-pay | Admitting: Gastroenterology

## 2018-01-15 ENCOUNTER — Other Ambulatory Visit: Payer: Self-pay | Admitting: *Deleted

## 2018-01-15 DIAGNOSIS — G8929 Other chronic pain: Secondary | ICD-10-CM

## 2018-01-15 DIAGNOSIS — M545 Low back pain: Principal | ICD-10-CM

## 2018-01-15 MED ORDER — HYDROCODONE-ACETAMINOPHEN 10-325 MG PO TABS: 1.0000 | ORAL_TABLET | Freq: Four times a day (QID) | ORAL | 0 refills | Status: DC | PRN

## 2018-01-15 NOTE — Telephone Encounter (Signed)
Patient requested refill NCCSRS Database Verified LR: 12/13/2017 Pharmacy Confirmed Pended Rx and sent to [facility] for approval.

## 2018-01-23 ENCOUNTER — Ambulatory Visit: Payer: Medicare Other | Admitting: Gastroenterology

## 2018-01-28 ENCOUNTER — Other Ambulatory Visit: Payer: Self-pay

## 2018-01-28 NOTE — Patient Outreach (Signed)
[facility]) Care Management  01/28/2018  Douglas Soto Dec 28, 1940 [id number]   Medication Adherence call to [name] patient did not answer patient is due on  Jardieance 25 mg and Rosuvastatin 5 mg under [place].  [facility] Management Direct Dial [phone]  Fax [phone] [name]

## 2018-02-13 ENCOUNTER — Other Ambulatory Visit: Payer: Self-pay | Admitting: *Deleted

## 2018-02-13 DIAGNOSIS — M545 Low back pain: Principal | ICD-10-CM

## 2018-02-13 DIAGNOSIS — G8929 Other chronic pain: Secondary | ICD-10-CM

## 2018-02-13 MED ORDER — HYDROCODONE-ACETAMINOPHEN 10-325 MG PO TABS: 1.0000 | ORAL_TABLET | Freq: Four times a day (QID) | ORAL | 0 refills | Status: DC | PRN

## 2018-02-13 NOTE — Telephone Encounter (Signed)
Patient requested Refill [place] Verified LR: 01/15/2018 Pended Rx and sent to [facility] for approval.

## 2018-02-15 DIAGNOSIS — K8020 Calculus of gallbladder without cholecystitis without obstruction: Secondary | ICD-10-CM

## 2018-02-15 DIAGNOSIS — K760 Fatty (change of) liver, not elsewhere classified: Secondary | ICD-10-CM

## 2018-02-15 DIAGNOSIS — J90 Pleural effusion, not elsewhere classified: Secondary | ICD-10-CM

## 2018-02-15 HISTORY — DX: Fatty (change of) liver, not elsewhere classified: K76.0

## 2018-02-15 HISTORY — DX: Pleural effusion, not elsewhere classified: J90

## 2018-02-15 HISTORY — DX: Calculus of gallbladder without cholecystitis without obstruction: K80.20

## 2018-02-19 ENCOUNTER — Other Ambulatory Visit: Payer: Self-pay

## 2018-02-19 ENCOUNTER — Telehealth: Payer: Self-pay

## 2018-02-19 ENCOUNTER — Emergency Department (HOSPITAL_COMMUNITY): Payer: Medicare Other

## 2018-02-19 ENCOUNTER — Inpatient Hospital Stay (HOSPITAL_COMMUNITY)
Admission: EM | Admit: 2018-02-19 | Discharge: 2018-02-25 | DRG: 377 | Disposition: A | Discharge status: Home / Self Care | Payer: Medicare Other | Attending: Family Medicine | Admitting: Family Medicine

## 2018-02-19 ENCOUNTER — Encounter (HOSPITAL_COMMUNITY): Payer: Self-pay

## 2018-02-19 DIAGNOSIS — E1129 Type 2 diabetes mellitus with other diabetic kidney complication: Secondary | ICD-10-CM | POA: Diagnosis not present

## 2018-02-19 DIAGNOSIS — I10 Essential (primary) hypertension: Secondary | ICD-10-CM | POA: Diagnosis present

## 2018-02-19 DIAGNOSIS — Z9114 Patient's other noncompliance with medication regimen: Secondary | ICD-10-CM

## 2018-02-19 DIAGNOSIS — R0602 Shortness of breath: Secondary | ICD-10-CM

## 2018-02-19 DIAGNOSIS — R1900 Intra-abdominal and pelvic swelling, mass and lump, unspecified site: Secondary | ICD-10-CM

## 2018-02-19 DIAGNOSIS — N183 Chronic kidney disease, stage 3 unspecified: Secondary | ICD-10-CM | POA: Insufficient documentation

## 2018-02-19 DIAGNOSIS — K922 Gastrointestinal hemorrhage, unspecified: Secondary | ICD-10-CM | POA: Diagnosis present

## 2018-02-19 DIAGNOSIS — E785 Hyperlipidemia, unspecified: Secondary | ICD-10-CM | POA: Diagnosis present

## 2018-02-19 DIAGNOSIS — Z7984 Long term (current) use of oral hypoglycemic drugs: Secondary | ICD-10-CM

## 2018-02-19 DIAGNOSIS — K2901 Acute gastritis with bleeding: Secondary | ICD-10-CM | POA: Diagnosis not present

## 2018-02-19 DIAGNOSIS — D62 Acute posthemorrhagic anemia: Secondary | ICD-10-CM | POA: Diagnosis not present

## 2018-02-19 DIAGNOSIS — Z833 Family history of diabetes mellitus: Secondary | ICD-10-CM

## 2018-02-19 DIAGNOSIS — N179 Acute kidney failure, unspecified: Secondary | ICD-10-CM | POA: Diagnosis present

## 2018-02-19 DIAGNOSIS — K921 Melena: Principal | ICD-10-CM | POA: Diagnosis present

## 2018-02-19 DIAGNOSIS — E1165 Type 2 diabetes mellitus with hyperglycemia: Secondary | ICD-10-CM | POA: Diagnosis not present

## 2018-02-19 DIAGNOSIS — Z9049 Acquired absence of other specified parts of digestive tract: Secondary | ICD-10-CM

## 2018-02-19 DIAGNOSIS — E876 Hypokalemia: Secondary | ICD-10-CM | POA: Diagnosis not present

## 2018-02-19 DIAGNOSIS — K8010 Calculus of gallbladder with chronic cholecystitis without obstruction: Secondary | ICD-10-CM | POA: Diagnosis not present

## 2018-02-19 DIAGNOSIS — E1122 Type 2 diabetes mellitus with diabetic chronic kidney disease: Secondary | ICD-10-CM | POA: Diagnosis not present

## 2018-02-19 DIAGNOSIS — E11649 Type 2 diabetes mellitus with hypoglycemia without coma: Secondary | ICD-10-CM | POA: Diagnosis not present

## 2018-02-19 DIAGNOSIS — I129 Hypertensive chronic kidney disease with stage 1 through stage 4 chronic kidney disease, or unspecified chronic kidney disease: Secondary | ICD-10-CM | POA: Diagnosis present

## 2018-02-19 DIAGNOSIS — Z8349 Family history of other endocrine, nutritional and metabolic diseases: Secondary | ICD-10-CM

## 2018-02-19 DIAGNOSIS — K760 Fatty (change of) liver, not elsewhere classified: Secondary | ICD-10-CM | POA: Diagnosis present

## 2018-02-19 DIAGNOSIS — R188 Other ascites: Secondary | ICD-10-CM | POA: Diagnosis present

## 2018-02-19 DIAGNOSIS — I4821 Permanent atrial fibrillation: Secondary | ICD-10-CM | POA: Diagnosis not present

## 2018-02-19 DIAGNOSIS — D123 Benign neoplasm of transverse colon: Secondary | ICD-10-CM | POA: Diagnosis not present

## 2018-02-19 DIAGNOSIS — M109 Gout, unspecified: Secondary | ICD-10-CM | POA: Diagnosis present

## 2018-02-19 DIAGNOSIS — D124 Benign neoplasm of descending colon: Secondary | ICD-10-CM

## 2018-02-19 DIAGNOSIS — Z8249 Family history of ischemic heart disease and other diseases of the circulatory system: Secondary | ICD-10-CM

## 2018-02-19 DIAGNOSIS — I272 Pulmonary hypertension, unspecified: Secondary | ICD-10-CM | POA: Diagnosis present

## 2018-02-19 DIAGNOSIS — Z6836 Body mass index (BMI) 36.0-36.9, adult: Secondary | ICD-10-CM

## 2018-02-19 DIAGNOSIS — I5031 Acute diastolic (congestive) heart failure: Secondary | ICD-10-CM

## 2018-02-19 DIAGNOSIS — Z888 Allergy status to other drugs, medicaments and biological substances status: Secondary | ICD-10-CM

## 2018-02-19 DIAGNOSIS — Z79891 Long term (current) use of opiate analgesic: Secondary | ICD-10-CM

## 2018-02-19 DIAGNOSIS — G8929 Other chronic pain: Secondary | ICD-10-CM | POA: Diagnosis present

## 2018-02-19 DIAGNOSIS — M545 Low back pain: Secondary | ICD-10-CM | POA: Diagnosis not present

## 2018-02-19 DIAGNOSIS — J9601 Acute respiratory failure with hypoxia: Secondary | ICD-10-CM | POA: Diagnosis not present

## 2018-02-19 DIAGNOSIS — Z905 Acquired absence of kidney: Secondary | ICD-10-CM

## 2018-02-19 DIAGNOSIS — H9190 Unspecified hearing loss, unspecified ear: Secondary | ICD-10-CM | POA: Diagnosis present

## 2018-02-19 DIAGNOSIS — K5730 Diverticulosis of large intestine without perforation or abscess without bleeding: Secondary | ICD-10-CM | POA: Diagnosis not present

## 2018-02-19 DIAGNOSIS — K8020 Calculus of gallbladder without cholecystitis without obstruction: Secondary | ICD-10-CM | POA: Diagnosis not present

## 2018-02-19 DIAGNOSIS — D649 Anemia, unspecified: Secondary | ICD-10-CM | POA: Diagnosis not present

## 2018-02-19 DIAGNOSIS — R140 Abdominal distension (gaseous): Secondary | ICD-10-CM | POA: Diagnosis present

## 2018-02-19 DIAGNOSIS — I482 Chronic atrial fibrillation, unspecified: Secondary | ICD-10-CM | POA: Diagnosis not present

## 2018-02-19 DIAGNOSIS — I340 Nonrheumatic mitral (valve) insufficiency: Secondary | ICD-10-CM | POA: Diagnosis not present

## 2018-02-19 DIAGNOSIS — K635 Polyp of colon: Secondary | ICD-10-CM

## 2018-02-19 DIAGNOSIS — Z85528 Personal history of other malignant neoplasm of kidney: Secondary | ICD-10-CM

## 2018-02-19 DIAGNOSIS — I959 Hypotension, unspecified: Secondary | ICD-10-CM | POA: Diagnosis not present

## 2018-02-19 DIAGNOSIS — D120 Benign neoplasm of cecum: Secondary | ICD-10-CM | POA: Diagnosis not present

## 2018-02-19 DIAGNOSIS — Z79899 Other long term (current) drug therapy: Secondary | ICD-10-CM

## 2018-02-19 DIAGNOSIS — D631 Anemia in chronic kidney disease: Secondary | ICD-10-CM | POA: Diagnosis not present

## 2018-02-19 DIAGNOSIS — D122 Benign neoplasm of ascending colon: Secondary | ICD-10-CM | POA: Diagnosis not present

## 2018-02-19 DIAGNOSIS — F17220 Nicotine dependence, chewing tobacco, uncomplicated: Secondary | ICD-10-CM | POA: Diagnosis present

## 2018-02-19 DIAGNOSIS — I872 Venous insufficiency (chronic) (peripheral): Secondary | ICD-10-CM | POA: Diagnosis present

## 2018-02-19 DIAGNOSIS — Z7982 Long term (current) use of aspirin: Secondary | ICD-10-CM

## 2018-02-19 DIAGNOSIS — D509 Iron deficiency anemia, unspecified: Secondary | ICD-10-CM | POA: Diagnosis not present

## 2018-02-19 DIAGNOSIS — J90 Pleural effusion, not elsewhere classified: Secondary | ICD-10-CM | POA: Diagnosis not present

## 2018-02-19 DIAGNOSIS — I509 Heart failure, unspecified: Secondary | ICD-10-CM

## 2018-02-19 DIAGNOSIS — R6881 Early satiety: Secondary | ICD-10-CM | POA: Diagnosis present

## 2018-02-19 DIAGNOSIS — R609 Edema, unspecified: Secondary | ICD-10-CM | POA: Diagnosis not present

## 2018-02-19 DIAGNOSIS — R630 Anorexia: Secondary | ICD-10-CM | POA: Diagnosis present

## 2018-02-19 DIAGNOSIS — R0902 Hypoxemia: Secondary | ICD-10-CM | POA: Diagnosis not present

## 2018-02-19 DIAGNOSIS — I4891 Unspecified atrial fibrillation: Secondary | ICD-10-CM | POA: Diagnosis not present

## 2018-02-19 DIAGNOSIS — Z836 Family history of other diseases of the respiratory system: Secondary | ICD-10-CM

## 2018-02-19 HISTORY — DX: Fatty (change of) liver, not elsewhere classified: K76.0

## 2018-02-19 HISTORY — DX: Calculus of gallbladder without cholecystitis without obstruction: K80.20

## 2018-02-19 HISTORY — DX: Pleural effusion, not elsewhere classified: J90

## 2018-02-19 LAB — BASIC METABOLIC PANEL
ANION GAP: 6 (ref 5–15)
BUN: 29 mg/dL — ABNORMAL HIGH (ref 8–23)
CHLORIDE: 99 mmol/L (ref 98–111)
CO2: 31 mmol/L (ref 22–32)
Calcium: 9.4 mg/dL (ref 8.9–10.3)
Creatinine, Ser: 1.82 mg/dL — ABNORMAL HIGH (ref 0.61–1.24)
GFR calc non Af Amer: 34 mL/min — ABNORMAL LOW (ref >60)
GFR, EST AFRICAN AMERICAN: 40 mL/min — AB (ref >60)
Glucose, Bld: 143 mg/dL — ABNORMAL HIGH (ref 70–99)
Potassium: 4 mmol/L (ref 3.5–5.1)
Sodium: 136 mmol/L (ref 135–145)

## 2018-02-19 LAB — CBC WITH DIFFERENTIAL/PLATELET
Abs Immature Granulocytes: 0.04 10*3/uL (ref 0.00–0.07)
Basophils Absolute: 0.1 10*3/uL (ref 0.0–0.1)
Basophils Relative: 1 %
Eosinophils Absolute: 0.1 10*3/uL (ref 0.0–0.5)
Eosinophils Relative: 1 %
HEMATOCRIT: 25.8 % — AB (ref 39.0–52.0)
HEMOGLOBIN: 7.4 g/dL — AB (ref 13.0–17.0)
IMMATURE GRANULOCYTES: 1 %
LYMPHS ABS: 0.8 10*3/uL (ref 0.7–4.0)
LYMPHS PCT: 10 %
MCH: 24 pg — ABNORMAL LOW (ref 26.0–34.0)
MCHC: 28.7 g/dL — ABNORMAL LOW (ref 30.0–36.0)
MCV: 83.8 fL (ref 80.0–100.0)
Monocytes Absolute: 0.7 10*3/uL (ref 0.1–1.0)
Monocytes Relative: 9 %
NEUTROS PCT: 78 %
NRBC: 0 % (ref 0.0–0.2)
Neutro Abs: 6.7 10*3/uL (ref 1.7–7.7)
Platelets: 199 10*3/uL (ref 150–400)
RBC: 3.08 MIL/uL — ABNORMAL LOW (ref 4.22–5.81)
RDW: 15.7 % — ABNORMAL HIGH (ref 11.5–15.5)
WBC: 8.4 10*3/uL (ref 4.0–10.5)

## 2018-02-19 LAB — PREPARE RBC (CROSSMATCH)

## 2018-02-19 LAB — PROTIME-INR
INR: 0.93
PROTHROMBIN TIME: 12.4 s (ref 11.4–15.2)

## 2018-02-19 LAB — I-STAT TROPONIN, ED: Troponin i, poc: 0 ng/mL (ref 0.00–0.08)

## 2018-02-19 LAB — ABO/RH: ABO/RH(D): O NEG

## 2018-02-19 LAB — BRAIN NATRIURETIC PEPTIDE: B NATRIURETIC PEPTIDE 5: 670 pg/mL — AB (ref 0.0–100.0)

## 2018-02-19 LAB — POC OCCULT BLOOD, ED: Fecal Occult Bld: POSITIVE — AB

## 2018-02-19 MED ORDER — ONDANSETRON HCL 4 MG PO TABS: 4.0000 mg | ORAL_TABLET | Freq: Four times a day (QID) | ORAL | Status: DC | PRN

## 2018-02-19 MED ORDER — SODIUM CHLORIDE 0.9 % IV SOLN
80.0000 mg | Freq: Once | INTRAVENOUS | Status: AC
  Administered 2018-02-19: 80 mg via INTRAVENOUS
  Filled 2018-02-19: qty 80

## 2018-02-19 MED ORDER — ROSUVASTATIN CALCIUM 10 MG PO TABS
5.0000 mg | ORAL_TABLET | Freq: Every day | ORAL | Status: DC
  Administered 2018-02-20 – 2018-02-21 (×2): 5 mg via ORAL
  Filled 2018-02-19 (×2): qty 1

## 2018-02-19 MED ORDER — AMLODIPINE BESYLATE 10 MG PO TABS: 10.0000 mg | ORAL_TABLET | Freq: Every day | ORAL | Status: DC

## 2018-02-19 MED ORDER — METOPROLOL TARTRATE 50 MG PO TABS: 50.0000 mg | ORAL_TABLET | Freq: Every day | ORAL | Status: DC

## 2018-02-19 MED ORDER — FUROSEMIDE 10 MG/ML IJ SOLN
60.0000 mg | Freq: Once | INTRAMUSCULAR | Status: AC
  Administered 2018-02-19: 60 mg via INTRAVENOUS
  Filled 2018-02-19: qty 6

## 2018-02-19 MED ORDER — ACETAMINOPHEN 650 MG RE SUPP: 650.0000 mg | Freq: Four times a day (QID) | RECTAL | Status: DC | PRN

## 2018-02-19 MED ORDER — INSULIN ASPART 100 UNIT/ML ~~LOC~~ SOLN: 0.0000 [IU] | Freq: Every day | SUBCUTANEOUS | Status: DC

## 2018-02-19 MED ORDER — HYDROCODONE-ACETAMINOPHEN 10-325 MG PO TABS
1.0000 | ORAL_TABLET | Freq: Four times a day (QID) | ORAL | Status: DC | PRN
  Administered 2018-02-20 – 2018-02-23 (×10): 1 via ORAL
  Filled 2018-02-19 (×10): qty 1

## 2018-02-19 MED ORDER — ONDANSETRON HCL 4 MG/2ML IJ SOLN: 4.0000 mg | Freq: Four times a day (QID) | INTRAMUSCULAR | Status: DC | PRN

## 2018-02-19 MED ORDER — FUROSEMIDE 10 MG/ML IJ SOLN: 40.0000 mg | Freq: Two times a day (BID) | INTRAMUSCULAR | Status: DC

## 2018-02-19 MED ORDER — INSULIN ASPART 100 UNIT/ML ~~LOC~~ SOLN
0.0000 [IU] | Freq: Three times a day (TID) | SUBCUTANEOUS | Status: DC
  Administered 2018-02-20: 1 [IU] via SUBCUTANEOUS
  Administered 2018-02-22: 2 [IU] via SUBCUTANEOUS
  Administered 2018-02-23: 1 [IU] via SUBCUTANEOUS
  Administered 2018-02-23: 2 [IU] via SUBCUTANEOUS
  Administered 2018-02-24: 1 [IU] via SUBCUTANEOUS
  Administered 2018-02-24: 2 [IU] via SUBCUTANEOUS
  Administered 2018-02-25: 1 [IU] via SUBCUTANEOUS
  Administered 2018-02-25: 2 [IU] via SUBCUTANEOUS

## 2018-02-19 MED ORDER — DOXAZOSIN MESYLATE 2 MG PO TABS: 8.0000 mg | ORAL_TABLET | Freq: Every day | ORAL | Status: DC

## 2018-02-19 MED ORDER — HYDRALAZINE HCL 20 MG/ML IJ SOLN: 5.0000 mg | INTRAMUSCULAR | Status: DC | PRN

## 2018-02-19 MED ORDER — SODIUM CHLORIDE 0.9 % IV SOLN
8.0000 mg/h | INTRAVENOUS | Status: DC
  Administered 2018-02-19 – 2018-02-20 (×2): 8 mg/h via INTRAVENOUS
  Filled 2018-02-19 (×3): qty 80

## 2018-02-19 MED ORDER — ZOLPIDEM TARTRATE 5 MG PO TABS: 5.0000 mg | ORAL_TABLET | Freq: Every evening | ORAL | Status: DC | PRN

## 2018-02-19 MED ORDER — CENTRUM PO CHEW: 1.0000 | CHEWABLE_TABLET | Freq: Every day | ORAL | Status: DC

## 2018-02-19 MED ORDER — ALBUTEROL SULFATE (2.5 MG/3ML) 0.083% IN NEBU: 2.5000 mg | INHALATION_SOLUTION | RESPIRATORY_TRACT | Status: DC | PRN

## 2018-02-19 MED ORDER — ACETAMINOPHEN 325 MG PO TABS
650.0000 mg | ORAL_TABLET | Freq: Four times a day (QID) | ORAL | Status: DC | PRN
  Filled 2018-02-19: qty 2

## 2018-02-19 MED ORDER — ADULT MULTIVITAMIN W/MINERALS CH
1.0000 | ORAL_TABLET | Freq: Every day | ORAL | Status: DC
  Administered 2018-02-20 – 2018-02-21 (×2): 1 via ORAL
  Filled 2018-02-19 (×2): qty 1

## 2018-02-19 MED ORDER — SODIUM CHLORIDE 0.9% IV SOLUTION: Freq: Once | INTRAVENOUS | Status: DC

## 2018-02-19 NOTE — Progress Notes (Signed)
Called to take report. I was informed that nurse is unavailable to give report.

## 2018-02-19 NOTE — ED Provider Notes (Signed)
[facility] EMERGENCY DEPARTMENT Provider Note   CSN: [id number] Arrival date & time: 02/19/18  1624     History   Chief Complaint Chief Complaint  Patient presents with  . Shortness of Breath    HPI Douglas Soto is a 77 y.o. male.  Pt presents to the ED today with sob and abdominal distention for the past few weeks.  Pt said his legs have gotten swollen.  Sob worse with exertion.  The pt denies f/c.  Cough started today.   He has also had black stool for several days.  He is supposed to be on Xarelto for a.fib, but has not taken it in months because it caused him to bleed.  He does take ASA.  He has never had a colonoscopy, but has one scheduled in 2 weeks with [facility].  CHA2DS2/VAS Stroke Risk Points  Current as of 3 minutes ago     4 >= 2 Points: High Risk  1 - 1.99 Points: Medium Risk  0 Points: Low Risk    This is the only CHA2DS2/VAS Stroke Risk Points available for the past  year.:  Last Change: N/A     Details    This score determines the patient's risk of having a stroke if the  patient has atrial fibrillation.       Points Metrics  0 Has Congestive Heart Failure:  No    Current as of 3 minutes ago  0 Has Vascular Disease:  No    Current as of 3 minutes ago  1 Has Hypertension:  Yes    Current as of 3 minutes ago  2 Age:  22    Current as of 3 minutes ago  1 Has Diabetes:  Yes    Current as of 3 minutes ago  0 Had Stroke:  No  Had TIA:  No  Had thromboembolism:  No    Current as of 3 minutes ago  0 Male:  No    Current as of 3 minutes ago              Past Medical History:  Diagnosis Date  . Chronic kidney disease, stage II (mild)   . Depression   . Diabetes mellitus without complication ([place])   . Essential hypertension, malignant   . Gout, unspecified   . Hyperlipidemia   . Lumbago   . Obesity, unspecified   . Pain in joint, pelvic region and thigh   . Special screening for malignant neoplasm of prostate      Patient Active Problem List   Diagnosis Date Noted  . GIB (gastrointestinal bleeding) 02/19/2018  . Symptomatic anemia 02/19/2018  . Pleural effusion on right 02/19/2018  . SOB (shortness of breath) 02/19/2018  . Otitis externa 07/19/2016  . Edema 09/17/2015  . DM type 2, uncontrolled, with renal complications ([place]) 47/12/6281  . Atrial fibrillation ([place]) 10/15/2013  . Hip pain, bilateral 11/20/2012  . Gout   . Acute renal failure superimposed on stage 3 chronic kidney disease ([place])   . Essential hypertension, malignant   . Lumbago   . Hyperlipidemia   . Obesity     Past Surgical History:  Procedure Laterality Date  . APPENDECTOMY    . CYST EXCISION  1998   back  . 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  Authorizing Provider  doxazosin (CARDURA) 8 MG tablet TAKE 1 TABLET BY MOUTH ONCE DAILY FOR BLOOD PRESSURE Patient taking differently: Take 8 mg by mouth daily. FOR BLOOD PRESSURE 09/03/17  Yes Lauree Chandler, NP  empagliflozin (JARDIANCE) 25 MG TABS tablet Take one each morning to control diabetes Patient taking differently: Take 25 mg by mouth every morning.  05/22/17  Yes Lauree Chandler, NP  HYDROcodone-acetaminophen (NORCO) 10-325 MG tablet Take 1 tablet by mouth every 6 (six) hours as needed (pain). 02/13/18  Yes Lauree Chandler, NP  metFORMIN (GLUCOPHAGE) 1000 MG tablet TAKE 1 TABLET BY MOUTH TWICE DAILY WITH MEALS Patient taking differently: Take 1,000 mg by mouth 2 (two) times daily.  07/30/17  Yes Lauree Chandler, NP  metoprolol tartrate (LOPRESSOR) 50 MG tablet TAKE 1 TABLET BY MOUTH TWICE DAILY TO CONTROL BLOOD PRESSURE AND HEART RHYTHM Patient taking differently: Take 50 mg by mouth daily.  12/14/17  Yes Lauree Chandler, NP  multivitamin-iron-minerals-folic acid (CENTRUM) chewable tablet Chew 1 tablet by mouth daily.   Yes  [provider]  Olmesartan-amLODIPine-HCTZ 40-10-25 MG TABS TAKE 1 TABLET BY MOUTH ONCE DAILY FOR BLOOD PRESSURE Patient taking differently: Take 1 tablet by mouth daily.  09/28/17  Yes Lauree Chandler, NP  rivaroxaban (XARELTO) 20 MG TABS tablet Take 1 tablet (20 mg total) by mouth daily with supper. 09/14/17  Yes Lauree Chandler, NP  rosuvastatin (CRESTOR) 5 MG tablet TAKE 1 TABLET BY MOUTH ONCE DAILY Patient taking differently: Take 5 mg by mouth daily.  10/11/17  Yes Lauree Chandler, NP  ULORIC 40 MG tablet TAKE 1 TABLET BY MOUTH ONCE DAILY Patient not taking: Reported on 02/19/2018 07/03/17   Lauree Chandler, NP    Family History Family History  Problem Relation Age of Onset  . Heart Problems Father   . Heart disease Brother   . Diabetes Brother   . Diabetes Brother   . Heart Problems Mother   . Pneumonia Maternal Grandfather     Social History Social History   Tobacco Use  . Smoking status: Former Smoker    Last attempt to quit: 04/17/1972    Years since quitting: 45.8  . Smokeless tobacco: Current User    Types: Chew  Substance Use Topics  . Alcohol use: Yes    Alcohol/week: 5.0 standard drinks    Types: 5 Cans of beer per week  . Drug use: No     Allergies   Allopurinol   Review of Systems Review of Systems  Respiratory: Positive for shortness of breath.   Cardiovascular: Positive for leg swelling.  Gastrointestinal: Positive for abdominal distention.  All other systems reviewed and are negative.    Physical Exam Updated Vital Signs BP (!) 134/104   Pulse 76   Temp (!) 97.4 F (36.3 C) (Oral)   Resp 18   Ht 5\' 8"  (1.727 m)   Wt 108.9 kg   SpO2 95%   BMI 36.49 kg/m   Physical Exam  Constitutional: He is oriented to person, place, and time. He appears well-developed and well-nourished.  HENT:  Head: Normocephalic and atraumatic.  Mouth/Throat: Oropharynx is clear and moist.  Eyes: Pupils are equal, round, and reactive to light.  EOM are normal.  Neck: Normal range of motion. Neck supple.  Cardiovascular: Normal rate. An irregularly irregular rhythm present.  Pulmonary/Chest: Tachypnea noted. He has rhonchi.  Abdominal: Soft. Bowel sounds are normal.  Genitourinary: Rectal exam shows guaiac positive stool.  Musculoskeletal:  Normal range of motion.       Right lower leg: He exhibits edema.       Left lower leg: He exhibits edema.  Neurological: He is alert and oriented to person, place, and time.  Skin: Skin is warm.  Psychiatric: He has a normal mood and affect. His behavior is normal.  Nursing note and vitals reviewed.    ED Treatments / Results  Labs (all labs ordered are listed, but only abnormal results are displayed) Labs Reviewed  BASIC METABOLIC PANEL - Abnormal; Notable for the following components:      Result Value   Glucose, Bld 143 (*)    BUN 29 (*)    Creatinine, Ser 1.82 (*)    GFR calc non Af Amer 34 (*)    GFR calc Af Amer 40 (*)    All other components within normal limits  CBC WITH DIFFERENTIAL/PLATELET - Abnormal; Notable for the following components:   RBC 3.08 (*)    Hemoglobin 7.4 (*)    HCT 25.8 (*)    MCH 24.0 (*)    MCHC 28.7 (*)    RDW 15.7 (*)    All other components within normal limits  BRAIN NATRIURETIC PEPTIDE - Abnormal; Notable for the following components:   B Natriuretic Peptide 670.0 (*)    All other components within normal limits  POC OCCULT BLOOD, ED - Abnormal; Notable for the following components:   Fecal Occult Bld POSITIVE (*)    All other components within normal limits  PROTIME-INR  I-STAT TROPONIN, ED  PREPARE RBC (CROSSMATCH)  TYPE AND SCREEN  ABO/RH    EKG EKG Interpretation  Date/Time:  Tuesday February 19 2018 16:33:50 EST Ventricular Rate:  77 PR Interval:    QRS Duration: 96 QT Interval:  384 QTC Calculation: 435 R Axis:   64 Text Interpretation:  Atrial fibrillation Low voltage, extremity and precordial leads No old tracing to  compare Confirmed by Isla Pence [phone]) on 02/19/2018 5:12:07 PM   Radiology Dg Chest Port 1 View  Result Date: 02/19/2018 CLINICAL DATA:  Short of breath EXAM: PORTABLE CHEST 1 VIEW COMPARISON:  09/29/2009 FINDINGS: Moderate right pleural effusion. Dense airspace disease at the right middle lobe and right base. Cardiomediastinal silhouette is largely obscured. There is vascular congestion. No pneumothorax. IMPRESSION: Moderate right pleural effusion with dense atelectasis or pneumonia at the right middle lobe and right base. Vascular congestion. Electronically Signed   By: Donavan Foil M.D.   On: 02/19/2018 17:10    Procedures Procedures (including critical care time)  Medications Ordered in ED Medications  0.9 %  sodium chloride infusion (Manually program via Guardrails IV Fluids) (has no administration in time range)  pantoprazole (PROTONIX) 80 mg in sodium chloride 0.9 % 100 mL IVPB (has no administration in time range)  pantoprazole (PROTONIX) 80 mg in sodium chloride 0.9 % 250 mL (0.32 mg/mL) infusion (has no administration in time range)  furosemide (LASIX) injection 60 mg (60 mg Intravenous Given 02/19/18 1712)     Initial Impression / Assessment and Plan / ED Course  I have reviewed the triage vital signs and the nursing notes.  Pertinent labs & imaging results that were available during my care of the patient were reviewed by me and considered in my medical decision making (see chart for details).  He is hypoxic, so pt was placed on oxygen.  On 2L, O2 sats are in the mid 90s.  Pt placed on a protonix gtt and 2 units  blood ordered for transfusion.    Pt d/w Dr. Fuller Plan ( GI) who will see pt in the morning as his vitals are stable.    CRITICAL CARE Performed by: Isla Pence   Total critical care time: 30 minutes  Critical care time was exclusive of separately billable procedures and treating other patients.  Critical care was necessary to treat or prevent  imminent or life-threatening deterioration.  Critical care was time spent personally by me on the following activities: development of treatment plan with patient and/or surrogate as well as nursing, discussions with consultants, evaluation of patient's response to treatment, examination of patient, obtaining history from patient or surrogate, ordering and performing treatments and interventions, ordering and review of laboratory studies, ordering and review of radiographic studies, pulse oximetry and re-evaluation of patient's condition.  Pt d/w Dr. Blaine Hamper (triad) for admission.  Final Clinical Impressions(s) / ED Diagnoses   Final diagnoses:  Pleural effusion on right  Symptomatic anemia  Upper GI bleed  CKD (chronic kidney disease) stage 3, GFR 30-59 ml/min (HCC)  Atrial fibrillation with controlled ventricular rate [facility])    ED Discharge Orders    None       Isla Pence, MD 02/19/18 1947

## 2018-02-19 NOTE — Progress Notes (Addendum)
BP 85/63. Text Paged Dr Blaine Hamper. Awaiting response. Call back received from Dr Blaine Hamper to recheck BP in leg. BP checked in right leg. BP 125/81. Dr Blaine Hamper made aware. Per Dr Blaine Hamper nurse should start blood transfusion and hold BP medication.

## 2018-02-19 NOTE — Telephone Encounter (Signed)
noted thank you

## 2018-02-19 NOTE — H&P (Signed)
History and Physical    Douglas Soto:[id number] DOB: 02/09/1941 DOA: 02/19/2018  Referring MD/NP/PA:   PCP: Lauree Chandler, NP   Patient coming from:  The patient is coming from home.  At baseline, pt is independent for most of ADL.        Chief Complaint: Dark stool, shortness of breath, bilateral leg edema and abdominal edema  HPI: Douglas Soto is a 77 y.o. 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  Patient states that he has been having bilateral leg edema and abdominal swelling/distention for more than 2 months, which has been progressively getting worse.  He developed shortness of breath in the past 2 weeks, which has been progressively getting worse.  His shortness of breath is worse on exertion.  No chest pain, cough, fever or chills.  He states that he he noted black stool in the past 2 days.  He has nausea, no vomiting, diarrhea or abdominal pain.  No symptoms of UTI.  No unilateral weakness.  Patient is taking baby aspirin.  Patient states that he stopped taking Xarelto a year ago since he had skin bruises.Marland Kitchen He never had colonoscopy or EGD in the past.  ED Course: pt was found to have positive FOBT, hemoglobin 7.4 (Hg 13.0 on 09/11/2017-->9.2 on 12/24/2017), BNP 670, INR 0.93, worsening renal function, temperature normal, no tachycardia, no tachypnea, oxygen saturation 88% on room air, which improved to 95% on 2 L nasal cannula oxygen.  Chest x-ray showed moderate right pleural effusion and vascular congestion.  Patient is admitted to telemetry bed as inpatient.  Review of Systems:   General: no fevers, chills, has poor appetite, has fatigue HEENT: no blurry vision, hearing changes or sore throat Respiratory: has dyspnea, no  coughing, wheezing CV: no chest pain, no palpitations GI: has nausea, no vomiting, abdominal pain, diarrhea, constipation. Has dark stool. GU: no dysuria, burning on urination, increased urinary frequency, hematuria  Ext: has leg edema Neuro: no unilateral weakness, numbness, or tingling, no vision change or hearing loss Skin: no rash, no skin tear. MSK: No muscle spasm, no deformity, no limitation of range of movement in spin Heme: No easy bruising.  Travel history: No recent long distant travel.  Allergy:  Allergies  Allergen Reactions  . Allopurinol Other (See Comments)    Whelps on body and sores in mouth     Past Medical History:  Diagnosis Date  . Chronic kidney disease, stage II (mild)   . Depression   . Diabetes mellitus without complication ([place])   . Essential hypertension, malignant   . Gout, unspecified   . Hyperlipidemia   . Lumbago   . Obesity, unspecified   . Pain in joint, pelvic region and thigh   . Special screening for malignant neoplasm of prostate     Past Surgical History:  Procedure Laterality Date  . APPENDECTOMY    . CYST EXCISION  1998   back  . 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 His smokeless tobacco use includes chew. He reports that he drinks about 5.0 standard drinks of alcohol per week. He reports that he does not use drugs.  Family History:  Family History  Problem Relation Age  of Onset  . Heart Problems Father   . Heart disease Brother   . Diabetes Brother   . Diabetes Brother   . Heart Problems Mother   . Pneumonia Maternal Grandfather      Prior to Admission medications   Medication Sig Start Date End Date Taking? Authorizing Provider  doxazosin (CARDURA) 8 MG tablet TAKE 1 TABLET BY MOUTH ONCE DAILY FOR BLOOD PRESSURE Patient taking differently: Take 8 mg by mouth daily. FOR BLOOD PRESSURE 09/03/17  Yes Lauree Chandler, NP    empagliflozin (JARDIANCE) 25 MG TABS tablet Take one each morning to control diabetes Patient taking differently: Take 25 mg by mouth every morning.  05/22/17  Yes Lauree Chandler, NP  HYDROcodone-acetaminophen (NORCO) 10-325 MG tablet Take 1 tablet by mouth every 6 (six) hours as needed (pain). 02/13/18  Yes Lauree Chandler, NP  metFORMIN (GLUCOPHAGE) 1000 MG tablet TAKE 1 TABLET BY MOUTH TWICE DAILY WITH MEALS Patient taking differently: Take 1,000 mg by mouth 2 (two) times daily.  07/30/17  Yes Lauree Chandler, NP  metoprolol tartrate (LOPRESSOR) 50 MG tablet TAKE 1 TABLET BY MOUTH TWICE DAILY TO CONTROL BLOOD PRESSURE AND HEART RHYTHM Patient taking differently: Take 50 mg by mouth daily.  12/14/17  Yes Lauree Chandler, NP  multivitamin-iron-minerals-folic acid (CENTRUM) chewable tablet Chew 1 tablet by mouth daily.   Yes [provider]  Olmesartan-amLODIPine-HCTZ 40-10-25 MG TABS TAKE 1 TABLET BY MOUTH ONCE DAILY FOR BLOOD PRESSURE Patient taking differently: Take 1 tablet by mouth daily.  09/28/17  Yes Lauree Chandler, NP  rivaroxaban (XARELTO) 20 MG TABS tablet Take 1 tablet (20 mg total) by mouth daily with supper. 09/14/17  Yes Lauree Chandler, NP  rosuvastatin (CRESTOR) 5 MG tablet TAKE 1 TABLET BY MOUTH ONCE DAILY Patient taking differently: Take 5 mg by mouth daily.  10/11/17  Yes Lauree Chandler, NP  ULORIC 40 MG tablet TAKE 1 TABLET BY MOUTH ONCE DAILY Patient not taking: Reported on 02/19/2018 07/03/17   Lauree Chandler, NP    Physical Exam: Vitals:   02/19/18 2252 02/19/18 2304 02/19/18 2307 02/19/18 2333  BP: (S) (!) 85/63 125/81  (!) 130/95  Pulse: 73 79 88 83  Resp: 18  18 20   Temp: 98.2 F (36.8 C)  (!) 97.4 F (36.3 C) (!) 97.5 F (36.4 C)  TempSrc: Oral  Oral Oral  SpO2: 96%  97% 96%  Weight:      Height:       General: Not in acute distress. Pale looking. HEENT:       Eyes: PERRL, EOMI, no scleral icterus.       ENT: No discharge  from the ears and nose, no pharynx injection, no tonsillar enlargement.        Neck: positive JVD, no bruit, no mass felt. Heme: No neck lymph node enlargement. Cardiac: S1/S2, RRR, No murmurs, No gallops or rubs. Respiratory: No rales, wheezing, rhonchi or rubs. GI: Soft, nondistended, nontender, no rebound pain, no organomegaly, BS present. GU: No hematuria Ext: 2+ pitting leg edema bilaterally. 2+DP/PT pulse bilaterally. Musculoskeletal: No joint deformities, No joint redness or warmth, no limitation of ROM in spin. Skin: No rashes.  Neuro: Alert, oriented X3, cranial nerves II-XII grossly intact, moves all extremities normally.   Psych: Patient is not psychotic, no suicidal or hemocidal ideation.  Labs on Admission: I have personally reviewed following labs and imaging studies  CBC: Recent Labs  Lab 02/19/18 1709 02/19/18 2257  WBC 8.4 8.7  NEUTROABS 6.7  --   HGB 7.4* 7.2*  HCT 25.8* 24.3*  MCV 83.8 81.5  PLT 199 081   Basic Metabolic Panel: Recent Labs  Lab 02/19/18 1709  NA 136  K 4.0  CL 99  CO2 31  GLUCOSE 143*  BUN 29*  CREATININE 1.82*  CALCIUM 9.4   GFR: Estimated Creatinine Clearance: 41.3 mL/min (A) (by C-G formula based on SCr of 1.82 mg/dL (H)). Liver Function Tests: Recent Labs  Lab 02/19/18 2257  AST 14*  ALT 14  ALKPHOS 55  BILITOT 1.2  PROT 5.6*  ALBUMIN 3.1*   No results for input(s): LIPASE, AMYLASE in the last 168 hours. No results for input(s): AMMONIA in the last 168 hours. Coagulation Profile: Recent Labs  Lab 02/19/18 1753  INR 0.93   Cardiac Enzymes: No results for input(s): CKTOTAL, CKMB, CKMBINDEX, TROPONINI in the last 168 hours. BNP (last 3 results) No results for input(s): PROBNP in the last 8760 hours. HbA1C: No results for input(s): HGBA1C in the last 72 hours. CBG: No results for input(s): GLUCAP in the last 168 hours. Lipid Profile: No results for input(s): CHOL, HDL, LDLCALC, TRIG, CHOLHDL, LDLDIRECT in the  last 72 hours. Thyroid Function Tests: No results for input(s): TSH, T4TOTAL, FREET4, T3FREE, THYROIDAB in the last 72 hours. Anemia Panel: Recent Labs    02/19/18 2257  VITAMINB12 241  FOLATE 9.1  FERRITIN 10*  TIBC 459*  IRON 27*  RETICCTPCT 3.1   Urine analysis: No results found for: COLORURINE, APPEARANCEUR, LABSPEC, PHURINE, GLUCOSEU, HGBUR, BILIRUBINUR, KETONESUR, PROTEINUR, UROBILINOGEN, NITRITE, LEUKOCYTESUR Sepsis Labs: @LABRCNTIP (procalcitonin:4,lacticidven:4) )No results found for this or any previous visit (from the past 240 hour(s)).   Radiological Exams on Admission: Dg Chest Port 1 View  Result Date: 02/19/2018 CLINICAL DATA:  Short of breath EXAM: PORTABLE CHEST 1 VIEW COMPARISON:  09/29/2009 FINDINGS: Moderate right pleural effusion. Dense airspace disease at the right middle lobe and right base. Cardiomediastinal silhouette is largely obscured. There is vascular congestion. No pneumothorax. IMPRESSION: Moderate right pleural effusion with dense atelectasis or pneumonia at the right middle lobe and right base. Vascular congestion. Electronically Signed   By: Donavan Foil M.D.   On: 02/19/2018 17:10     EKG: Independently reviewed.  Not done in ED, will get one.   Assessment/Plan Principal Problem:   GIB (gastrointestinal bleeding) Active Problems:   Acute renal failure superimposed on stage 3 chronic kidney disease (HCC)   Hyperlipidemia   Atrial fibrillation ([place])   DM type 2, uncontrolled, with renal complications (HCC)   Symptomatic anemia   Pleural effusion on right   Acute respiratory failure with hypoxia (HCC)   Acute CHF   Essential hypertension   GIB, symptomatic anemia due to blood loss: Hemoglobin dropped from 13.0 on 09/11/2017 to 9.2 on 12/24/2017, then 7.4 today.  Patient never had a EGD or colonoscopy in the past.  Source of bleeding is not complete clear.  GI, Dr. Fuller Plan of [facility] was consulted by EDP, will see patient tomorrow.  - will admit  to tele bed as inpt - transfuse 1 units of blood now - no IVF since pt is fluid overloaded clinically - GI consulted by Ed, will follow up recommendations - NPO for possible EGD - Start IV pantoprazole gtt - Zofran IV for nausea - Avoid NSAIDs and SQ heparin - Maintain IV access (2 large bore IVs if possible). - Monitor closely and follow q6h cbc, transfuse as necessary, if Hgb<7.0 -  [facility]: INR, PTT and type screen  Acute respiratory failure with hypoxia due to possible acute CHF: Patient's shortness of breath is likely due to combination of anemia and possible acute CHF.  Patient has bilateral leg edema extending to lower abdomen, positive JVD, elevated BNP 670, vascular congestion on chest x-ray, indicating possible CHF.  He had 2D echo on 04/18/2016 which showed EF 60-65%.  Patient likely has diastolic CHF given long history of hypertension - will give 1 dose of Lasix 60 mg x 1, and reevaluate volume status and renal function in the morning. -2D echo  Acute renal failure superimposed on stage 3 chronic kidney disease ([place]): Baseline creatinine 1.5, his creatinine 1.82, BUN 29, likely due to cardiorenal syndrome and continuation of diuretics and olmesartan -hold olmesartan -f/u BMP  Essential hypertension: Initial blood pressure was 134/104, per nurse report, patient had blood pressure 85/63 in both arms. Recheck Bp in leg, which is 125/81. -will hold all oral blood pressure medications now -PRN hydralazine IV  Hyperlipidemia: -crestor  Atrial Fibrillation: CHA2DS2-VASc Score is 5, needs oral anticoagulation. Patient used to be on Xarelto, but stopped taking it for a year due to easy bruise. Heart rate is well controlled. He states that he is taking baby aspirin -hold ASA due to GI bleeding -Hold metoprolol due to soft blood pressure  DM type 2, uncontrolled, with renal complications ([place]): Last A1c 7.0 on 12/24/17, fairly controled. Patient is taking Metformin and Jardiance at  home -SSI  Pleural effusion on right: Possibly due to acute CHF -give lasix as above   Inpatient status:  # Patient requires inpatient status due to high intensity of service, high risk for further deterioration and high frequency of surveillance required.  I certify that at the point of admission it is my clinical judgment that the patient will require inpatient hospital care spanning beyond 2 midnights from the point of admission.  . 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 Now patient has presenting symptoms include dark stool, shortness of breath, bilateral leg edema and abdominal swelling, symptomatic anemia . The worrisome physical exam findings include bilateral leg edema, swelling abdomen, pale looking, soft blood pressure . The initial radiographic and laboratory data are worrisome because of elevated BNP, worsening renal function, low hemoglobin 7.4, positive FOBT, pleural effusion, vascular congestion on chest x-ray . Current medical needs: please see my assessment and plan . Predictability of an adverse outcome (risk): Patient has a symptomatic anemia due to GI bleeding, and soft Bp. Needs blood transfusion.  Patient has soft blood pressure, but due to possible acute CHF, cannot give IV fluid.  Patient is clinically complicated, is at high risk of deteriorating.  DVT ppx: SCD Code Status: Full code Family Communication:   Yes, patient's    at bed side Disposition Plan:  Anticipate discharge back to previous home environment Consults called:  GI, Dr. Fuller Plan Admission status:  Inpatient/tele       Date of Service 02/20/2018    Ivor Costa [facility] Pager [phone]  If 7PM-7AM, please contact night-coverage [email or web address] Password [email or web address] 02/20/2018, 1:03 AM

## 2018-02-19 NOTE — ED Triage Notes (Signed)
Per [facility], pt from w/ a c/o abd distention for a couple of months and SOB for a couple of weeks (with exertion and lying flat). Hx of nephrectomy in 2005. No pain or discomfort. No CP. BP 142/67, HR 50-90s (possible a-fib), SPo2 88, CBG 144. 20 ga IV in left forearm.

## 2018-02-19 NOTE — Progress Notes (Signed)
Bed alarm turned of per patient request. Fall education done with patient. Patient verbalizes understanding.

## 2018-02-19 NOTE — Telephone Encounter (Signed)
FYI: Patient called to let provider know that he is going to the emergency room due to having shortness of breath, bilateral leg swelling, and just overall not feeling well. Pt stated that he has also been having black stool for several days.   Pt will be taken to ED by his daughter.

## 2018-02-20 ENCOUNTER — Inpatient Hospital Stay (HOSPITAL_COMMUNITY): Payer: Medicare Other

## 2018-02-20 ENCOUNTER — Encounter (HOSPITAL_COMMUNITY): Payer: Self-pay | Admitting: Physician Assistant

## 2018-02-20 DIAGNOSIS — I340 Nonrheumatic mitral (valve) insufficiency: Secondary | ICD-10-CM

## 2018-02-20 DIAGNOSIS — I10 Essential (primary) hypertension: Secondary | ICD-10-CM | POA: Diagnosis present

## 2018-02-20 DIAGNOSIS — K922 Gastrointestinal hemorrhage, unspecified: Secondary | ICD-10-CM

## 2018-02-20 LAB — VITAMIN B12: VITAMIN B 12: 241 pg/mL (ref 180–914)

## 2018-02-20 LAB — BASIC METABOLIC PANEL
ANION GAP: 8 (ref 5–15)
BUN: 30 mg/dL — ABNORMAL HIGH (ref 8–23)
CHLORIDE: 100 mmol/L (ref 98–111)
CO2: 30 mmol/L (ref 22–32)
Calcium: 9.4 mg/dL (ref 8.9–10.3)
Creatinine, Ser: 2.02 mg/dL — ABNORMAL HIGH (ref 0.61–1.24)
GFR calc non Af Amer: 30 mL/min — ABNORMAL LOW (ref >60)
GFR, EST AFRICAN AMERICAN: 35 mL/min — AB (ref >60)
GLUCOSE: 103 mg/dL — AB (ref 70–99)
POTASSIUM: 3.4 mmol/L — AB (ref 3.5–5.1)
Sodium: 138 mmol/L (ref 135–145)

## 2018-02-20 LAB — CBC
HCT: 28.8 % — ABNORMAL LOW (ref 39.0–52.0)
HEMATOCRIT: 24.3 % — AB (ref 39.0–52.0)
HEMOGLOBIN: 7.2 g/dL — AB (ref 13.0–17.0)
HEMOGLOBIN: 8 g/dL — AB (ref 13.0–17.0)
MCH: 24.2 pg — ABNORMAL LOW (ref 26.0–34.0)
MCH: 23.3 pg — ABNORMAL LOW (ref 26.0–34.0)
MCHC: 29.6 g/dL — ABNORMAL LOW (ref 30.0–36.0)
MCHC: 27.8 g/dL — AB (ref 30.0–36.0)
MCV: 81.5 fL (ref 80.0–100.0)
MCV: 83.7 fL (ref 80.0–100.0)
NRBC: 0 % (ref 0.0–0.2)
PLATELETS: 206 10*3/uL (ref 150–400)
PLATELETS: 218 10*3/uL (ref 150–400)
RBC: 2.98 MIL/uL — ABNORMAL LOW (ref 4.22–5.81)
RBC: 3.44 MIL/uL — AB (ref 4.22–5.81)
RDW: 15.7 % — AB (ref 11.5–15.5)
RDW: 15.5 % (ref 11.5–15.5)
WBC: 8.7 10*3/uL (ref 4.0–10.5)
WBC: 8.4 10*3/uL (ref 4.0–10.5)
nRBC: 0 % (ref 0.0–0.2)

## 2018-02-20 LAB — FERRITIN: Ferritin: 10 ng/mL — ABNORMAL LOW (ref 24–336)

## 2018-02-20 LAB — IRON AND TIBC
IRON: 27 ug/dL — AB (ref 45–182)
Saturation Ratios: 6 % — ABNORMAL LOW (ref 17.9–39.5)
TIBC: 459 ug/dL — AB (ref 250–450)
UIBC: 432 ug/dL

## 2018-02-20 LAB — HEMOGLOBIN AND HEMATOCRIT, BLOOD
HEMATOCRIT: 27.4 % — AB (ref 39.0–52.0)
HEMOGLOBIN: 8.1 g/dL — AB (ref 13.0–17.0)

## 2018-02-20 LAB — RETICULOCYTES
Immature Retic Fract: 28.5 % — ABNORMAL HIGH (ref 2.3–15.9)
RBC.: 3.07 MIL/uL — AB (ref 4.22–5.81)
Retic Count, Absolute: 96.1 10*3/uL (ref 19.0–186.0)
Retic Ct Pct: 3.1 % (ref 0.4–3.1)

## 2018-02-20 LAB — APTT: aPTT: 34 seconds (ref 24–36)

## 2018-02-20 LAB — HEPATIC FUNCTION PANEL
ALBUMIN: 3.1 g/dL — AB (ref 3.5–5.0)
ALK PHOS: 55 U/L (ref 38–126)
ALT: 14 U/L (ref 0–44)
AST: 14 U/L — ABNORMAL LOW (ref 15–41)
BILIRUBIN INDIRECT: 0.9 mg/dL (ref 0.3–0.9)
Bilirubin, Direct: 0.3 mg/dL — ABNORMAL HIGH (ref 0.0–0.2)
TOTAL PROTEIN: 5.6 g/dL — AB (ref 6.5–8.1)
Total Bilirubin: 1.2 mg/dL (ref 0.3–1.2)

## 2018-02-20 LAB — GLUCOSE, CAPILLARY
GLUCOSE-CAPILLARY: 120 mg/dL — AB (ref 70–99)
GLUCOSE-CAPILLARY: 96 mg/dL (ref 70–99)
Glucose-Capillary: 107 mg/dL — ABNORMAL HIGH (ref 70–99)
Glucose-Capillary: 127 mg/dL — ABNORMAL HIGH (ref 70–99)
Glucose-Capillary: 123 mg/dL — ABNORMAL HIGH (ref 70–99)

## 2018-02-20 LAB — ECHOCARDIOGRAM COMPLETE
Height: 68 in
WEIGHTICAEL: 3840 [oz_av]

## 2018-02-20 LAB — FOLATE: FOLATE: 9.1 ng/mL (ref >5.9)

## 2018-02-20 MED ORDER — PANTOPRAZOLE SODIUM 40 MG PO TBEC
40.0000 mg | DELAYED_RELEASE_TABLET | Freq: Two times a day (BID) | ORAL | Status: DC
  Administered 2018-02-20 – 2018-02-21 (×3): 40 mg via ORAL
  Filled 2018-02-20 (×3): qty 1

## 2018-02-20 MED ORDER — FUROSEMIDE 10 MG/ML IJ SOLN
40.0000 mg | Freq: Two times a day (BID) | INTRAMUSCULAR | Status: DC
  Administered 2018-02-20 (×2): 40 mg via INTRAVENOUS
  Filled 2018-02-20 (×2): qty 4

## 2018-02-20 NOTE — Consult Note (Addendum)
[facility] Consult: 8:17 AM 02/20/2018  LOS: 1 day    Referring Provider: Dr Verlon Au  Primary Care Physician:  Lauree Chandler, NP Primary Gastroenterologist:  None.  Has OV to see Dr Havery Moros in 2 weeks.     Reason for Consultation:  melena   HPI: Douglas Soto is a 77 y.o. male.  PMH DM 2.  CKD stage 3. 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  Atrial fib.  On 81 mg ASA, previously on Xarelto, stopped on his own due to bleeding from skin and bruising.  Htn.   Obesity.  Gout.  Venous insufficiency.  Chronic back pain, chronic narcotics.  EF normal in 04/2016.  S/p appy.  Fatty liver on MRI in 2011.   Cologuard positive 11/2016.  He cancelled direct colonoscopy pre-visit that month.   Has appt in 2 weeks with Dr Havery Moros No previous Colonoscopy or EGD or involvement with GI MDs.  .    2 weeks of DOE and increased fatigue.  Few months of LE swelling and more recent abd swelling leading to sense of bloating that is worsened by PO, has consumed little in last week.  Waist measuring 40 inches c/w normal 38 inches.  No N/V or abd pain.  No dysphagia.  Since the weekend has had 3 black, soft, tarry stools.    Trending anemic over last 6 months: 13 >> 9.2 from May to September.   Hgb 7.2 >> 8.  MCV 81 Ferritin 10, low iron and sats as well.   BUN/Creat proportionally elevated.   BNP 670. coags WNL.  Right pleural effusion with atx vs PNA, vasc congestion on x ray.       238# in 07/2016, 240# currently.   Chews tobacco.  Drinks 2 beers on Tuesdays, 3 on Saturdays when he meets with buddies.  No NSAIDs, no ASA other than 81 mg daily.  Cardiologist not aware of him self discontinuing AC meds.  No hx of anemia, blood transfusion, blood PR.  Normal stools are brown qod.  No hx abnormal LFTs.      Past  Medical History:  Diagnosis Date  . Chronic kidney disease, stage II (mild)   . Depression   . Diabetes mellitus without complication ([place])   . Essential hypertension, malignant   . Gout, unspecified   . Hyperlipidemia   . Lumbago   . Obesity, unspecified   . Pain in joint, pelvic region and thigh   . Special screening for malignant neoplasm of prostate     Past Surgical History:  Procedure Laterality Date  . APPENDECTOMY    . CYST EXCISION  1998   back  . 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 Authorizing Provider  doxazosin (CARDURA) 8 MG tablet TAKE 1 TABLET BY MOUTH ONCE DAILY FOR BLOOD PRESSURE Patient taking differently: Take 8 mg by mouth daily. FOR BLOOD PRESSURE 09/03/17  Yes Lauree Chandler, NP  empagliflozin (JARDIANCE) 25 MG  TABS tablet Take one each morning to control diabetes Patient taking differently: Take 25 mg by mouth every morning.  05/22/17  Yes Lauree Chandler, NP  HYDROcodone-acetaminophen (NORCO) 10-325 MG tablet Take 1 tablet by mouth every 6 (six) hours as needed (pain). 02/13/18  Yes Lauree Chandler, NP  metFORMIN (GLUCOPHAGE) 1000 MG tablet TAKE 1 TABLET BY MOUTH TWICE DAILY WITH MEALS Patient taking differently: Take 1,000 mg by mouth 2 (two) times daily.  07/30/17  Yes Lauree Chandler, NP  metoprolol tartrate (LOPRESSOR) 50 MG tablet TAKE 1 TABLET BY MOUTH TWICE DAILY TO CONTROL BLOOD PRESSURE AND HEART RHYTHM Patient taking differently: Take 50 mg by mouth daily.  12/14/17  Yes Lauree Chandler, NP  multivitamin-iron-minerals-folic acid (CENTRUM) chewable tablet Chew 1 tablet by mouth daily.   Yes [provider]  Olmesartan-amLODIPine-HCTZ 40-10-25 MG TABS TAKE 1 TABLET BY MOUTH ONCE DAILY FOR BLOOD PRESSURE Patient taking differently: Take 1 tablet by mouth daily.  09/28/17  Yes Lauree Chandler, NP  rivaroxaban (XARELTO) 20 MG TABS  tablet Take 1 tablet (20 mg total) by mouth daily with supper. 09/14/17  Yes Lauree Chandler, NP  rosuvastatin (CRESTOR) 5 MG tablet TAKE 1 TABLET BY MOUTH ONCE DAILY Patient taking differently: Take 5 mg by mouth daily.  10/11/17  Yes Lauree Chandler, NP  ULORIC 40 MG tablet TAKE 1 TABLET BY MOUTH ONCE DAILY Patient not taking: Reported on 02/19/2018 07/03/17   Lauree Chandler, NP    Scheduled Meds: . sodium chloride   Intravenous Once  . insulin aspart  0-5 Units Subcutaneous QHS  . insulin aspart  0-9 Units Subcutaneous TID WC  . multivitamin with minerals  1 tablet Oral Daily  . rosuvastatin  5 mg Oral Daily   Infusions: . pantoprozole (PROTONIX) infusion 8 mg/hr (02/20/18 7829)   PRN Meds: acetaminophen **OR** acetaminophen, albuterol, hydrALAZINE, HYDROcodone-acetaminophen, ondansetron **OR** ondansetron (ZOFRAN) IV, zolpidem   Allergies as of 02/19/2018 - Review Complete 02/19/2018  Allergen Reaction Noted  . Allopurinol Other (See Comments) 02/16/2017    Family History  Problem Relation Age of Onset  . Heart Problems Father   . Heart disease Brother   . Diabetes Brother   . Diabetes Brother   . Heart Problems Mother   . Pneumonia Maternal Grandfather     Social History   Socioeconomic History  . Marital status: Widowed    Spouse name: Not on file  . Number of children: Not on file  . Years of education: Not on file  . Highest education level: Not on file  Occupational History  . Not on file  Social Needs  . Financial resource strain: Not on file  . Food insecurity:    Worry: Not on file    Inability: Not on file  . Transportation needs:    Medical: Not on file    Non-medical: Not on file  Tobacco Use  . Smoking status: Former Smoker    Last attempt to quit: 04/17/1972    Years since quitting: 45.8  . Smokeless tobacco: Current User    Types: Chew  Substance and Sexual Activity  . Alcohol use: Yes    Alcohol/week: 5.0 standard drinks    Types:  5 Cans of beer per week  . Drug use: No  . Sexual activity: Not Currently  Lifestyle  . Physical activity:    Days per week: Not on file    Minutes per session: Not on file  . Stress:  Not on file  Relationships  . Social connections:    Talks on phone: Not on file    Gets together: Not on file    Attends religious service: Not on file    Active member of club or organization: Not on file    Attends meetings of clubs or organizations: Not on file    Relationship status: Not on file  . Intimate partner violence:    Fear of current or ex partner: Not on file    Emotionally abused: Not on file    Physically abused: Not on file    Forced sexual activity: Not on file  Other Topics Concern  . Not on file  Social History Narrative   Widow   Former smoker stopped 1974, chews tobacco   Alcohol beer on weekends   Exercise  Walks dog       REVIEW OF SYSTEMS: Constitutional: per HPI.  Activity limited by hip and back pain at baseline, not active.   ENT:  No nose bleeds Pulm:  SOB,  No cough CV:  No palpitations, no LE edema.  GU:  No hematuria, no frequency GI:  Per HPI Heme:  Per HPI   Transfusions:  None ever Neuro:  No headaches, no peripheral tingling or numbness.  No syncope Derm:  No itching, no rash or sores.  Endocrine:  No sweats or chills.  No polyuria or dysuria Immunization:  Not queried.      PHYSICAL EXAM: Vital signs in last 24 hours: Vitals:   02/20/18 0557 02/20/18 0733  BP: (!) 144/72 128/75  Pulse: 87 91  Resp: 18 18  Temp: 98.3 F (36.8 C) 98.2 F (36.8 C)  SpO2: 100% 96%   Wt Readings from Last 3 Encounters:  02/19/18 108.9 kg  01/01/18 109 kg  09/14/17 103.4 kg    General: Patient looks unwell, chronically ill-appearing. Head: No facial asymmetry or swelling.  No signs of head trauma. Eyes: No scleral icterus.  Conjunctiva moderately pale.  EOMI. Ears: Slight HOH. Nose: No discharge or congestion Mouth: Edentulous.  Mucosa pink, moist,  clear.  Tongue midline. Neck: No JVD, no masses, no thyromegaly. Lungs: Clear bilaterally overall diminished.  No labored breathing or cough. Heart:  Irreg, Irreg, in 90s on tele.  No MRG.  S1, S2 present. Abdomen: Markedly protuberant but soft.  Not tender.  Old, long, transverse scar on the right.   Rectal: Did not repeat rectal exam.  FOBT positive on DRE. GU: No penile or scrotal edema Musc/Skeltl: No joint redness, swelling, significant deformity. Extremities: 2+ lower extremity edema feet, ankles., not extend into the thighs. Neurologic: Alert.  No tremor, no asterixis.  Moves all 4 limbs, strength not tested. Skin: No sores, rash, telangiectasia. Tattoos: None observed. Nodes: No cervical adenopathy. Psych: Cooperative, calm.  Intake/Output from previous day: 11/05 0701 - 11/06 0700 In: 415 [Blood:315; IV Piggyback:100] Out: 900 [Urine:900] Intake/Output this shift: No intake/output data recorded.  LAB RESULTS: Recent Labs    02/19/18 1709 02/19/18 2257 02/20/18 0602  WBC 8.4 8.7 8.4  HGB 7.4* 7.2* 8.0*  HCT 25.8* 24.3* 28.8*  PLT 199 206 218   BMET Lab Results  Component Value Date   NA 136 02/19/2018   NA 139 12/24/2017   NA 138 09/11/2017   K 4.0 02/19/2018   K 3.8 12/24/2017   K 4.0 09/11/2017   CL 99 02/19/2018   CL 98 12/24/2017   CL 98 09/11/2017   CO2 31 02/19/2018  CO2 34 (H) 12/24/2017   CO2 31 09/11/2017   GLUCOSE 143 (H) 02/19/2018   GLUCOSE 153 (H) 12/24/2017   GLUCOSE 117 (H) 09/11/2017   BUN 29 (H) 02/19/2018   BUN 23 12/24/2017   BUN 24 09/11/2017   CREATININE 1.82 (H) 02/19/2018   CREATININE 1.65 (H) 12/24/2017   CREATININE 1.57 (H) 09/11/2017   CALCIUM 9.4 02/19/2018   CALCIUM 9.8 12/24/2017   CALCIUM 10.2 09/11/2017   LFT Recent Labs    02/19/18 2257  PROT 5.6*  ALBUMIN 3.1*  AST 14*  ALT 14  ALKPHOS 55  BILITOT 1.2  BILIDIR 0.3*  IBILI 0.9   PT/INR Lab Results  Component Value Date   INR 0.93 02/19/2018    Hepatitis Panel No results for input(s): HEPBSAG, HCVAB, HEPAIGM, HEPBIGM in the last 72 hours. C-Diff No components found for: CDIFF Lipase  No results found for: LIPASE  Drugs of Abuse  No results found for: LABOPIA, COCAINSCRNUR, LABBENZ, AMPHETMU, THCU, LABBARB   RADIOLOGY STUDIES: Dg Chest Port 1 View  Result Date: 02/19/2018 CLINICAL DATA:  Short of breath EXAM: PORTABLE CHEST 1 VIEW COMPARISON:  09/29/2009 FINDINGS: Moderate right pleural effusion. Dense airspace disease at the right middle lobe and right base. Cardiomediastinal silhouette is largely obscured. There is vascular congestion. No pneumothorax. IMPRESSION: Moderate right pleural effusion with dense atelectasis or pneumonia at the right middle lobe and right base. Vascular congestion. Electronically Signed   By: Donavan Foil M.D.   On: 02/19/2018 17:10     IMPRESSION:   *   GI bleeding.  3 black, tarry stools in last 3 days.   R/o ulcer, AVMs, neoplasia, portal htn related bleeding.    *    Iron deficiency anemia.  Progressive over several months.  S/P 1 U PRBCs.    *   Cologuard + in 11/2016.  Pt did not follow through with colonoscopy.    *   Abdominal swelling, early satiety.  R/o ascites.  Fatty liver on MRI in 2011.  No hx elevated LFTs.  platelets and coags normal.     *   Chronic A fib.  Stopped xarelto (previously on Eliquis, stopped this on his own and NP started Xarelto in 08/2017) on his own. Chads vasc score 3 in 03/2016   *   Elevated BNP, chronic LE edema, pleural effusion.   ? What is EF now, was normal in 04/2016  *   DM 2.  Not on insulin.    *   Stage 3 CKD.     PLAN:     *   Needs EGD and colonoscopy.  Timing to be determined but not today unless he shows signs of GI hemorrhage, HD instablity in which case can pursue EGD.  Dr Ardis Hughs would like update as to cardiac status before committing to EGD/Colonoscopy and required sedation.  Echocardiogram is ordered.     *   Ultrasound abdomen  to assess swelling, r/o ascites.  After this he can eat.    *  Does not need PPI drip, switch to po Protonix.  Can finish the IV gtt, several hours remain on nearly full bag.    *   Hgb this afternoon.   CBC in AM.     Azucena Freed  02/20/2018, 8:17 AM Phone [phone]

## 2018-02-20 NOTE — Progress Notes (Signed)
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  Admitted from home with bilateral edema abdominal pain and some dark stool-abdominal distention X 2 months, S OB X 2 weeks  In the ED found to have + FOBT hemoglobin 7.4 BNP 670 and INR 0.9 renal function worsening no tachycardia no tachypnea CXR = moderate R fluid effusion  A & Plan Undifferentiated GI bleed-hemoglobin 13.5 28 2009 9.29 919 down to 7.4 on admission-[facility] consulted will be seen in consult-continue PPI IV n.p.o., Zofran and maintain 2 large-bore IVs transfuse if hemoglobin less than 7 Patient is currently on a low-salt diet  Acute blood loss anemia likely from GI bleeding-transfuse 2 units PRBC-repeat labs in a.m.  Acute respiratory failure secondary to CHF-last EF 60-65%-given Lasix 1 on admission echo repeated and pending Pleural effusion on one view x-ray His issues are subacute-check desat screen-dose lasix at dose IV 40 bid He has an oxygen req--If EF is normal-would NOT consult cardiology but diurese-presumed AECHF exacerbated by anemias etc--If however EF is low can speak to cardiology  Fatty liver-ultrasound performed confirms ascites, fatty liver-outpatient will need further stratification  Acute renal failure creatinine up from 1.5-1.8-in the setting of one kidney we will have to be cautious with diuresis and this will be adjusted on day-to-day basis  Chronic kidney disease stage III, prior history of carcinoma of kidney status post resection 2011-see above discussion-monitor  HTN-all meds on hold  Atrial fibrillation chads score >3 stopped Xarelto because of easy bruising about 1 year  prior-we will need discussion going forward once further evaluation of GI tract is performed  DM TY 2 uncontrolled last A1c 7.0-sugars are 96-1 07  Morbid obesity BMI 36    DVT prophylaxis: LOvenoex  Code Status: ip   Family Communication: d/w daughter at bedside   Disposition Plan:  inpatient    Verlon Au, MD  [facility] Direct contact: [phone] --Via amion app OR  --[email or web address]; password [email or web address]  7PM-7AM contact night coverage as above 02/20/2018, 8:17 AM  LOS: 1 day   Consultants:  GI  Procedures:  Await echo  Antimicrobials:   n  Interval history/Subjective: Awake pleasant in process getting echo No fever no chills no rash Some LE swelling 9 Did Pass a lot of urine after getting Lasix yesterday no fever  Objective:  Vitals:  Vitals:   02/20/18 0557 02/20/18 0733  BP: (!) 144/72 128/75  Pulse: 87 91  Resp: 18 18  Temp: 98.3 F (36.8 C) 98.2 F (36.8 C)  SpO2: 100% 96%    Exam:  . Obese pleasant no distress on nasal cannula oxygen . Chest is clear without added sound but limited exam as patient currently getting echo . Abdomen obese with shifting dullness . Grade 1-2 lower extremity edema . Neurologically intact . Power 5/5   I have personally reviewed the following:   Labs:  Hemoglobin 8.0 MCV 83  Imaging studies: USG 11/6=IMPRESSION: 1. Small amount of ascites. 2. Prominent right pleural effusion. 3. Cholelithiasis with thickening of the gallbladder wall with a negative sonographic Murphy's sign. 4. Increased echogenicity of the liver parenchyma consistent with  hepatic steatosis.  ECHO Pending  Medical tests:  Resolved    Test discussed with performing physician:  n  Decision to obtain old records:  n  Review and summation of old records:  nn  Scheduled Meds: . sodium chloride   Intravenous Once  . insulin aspart  0-5 Units Subcutaneous QHS  . insulin aspart  0-9 Units Subcutaneous TID WC  . multivitamin  with minerals  1 tablet Oral Daily  . rosuvastatin  5 mg Oral Daily   Continuous Infusions: . pantoprozole (PROTONIX) infusion 8 mg/hr (02/20/18 1947)    Principal Problem:   GIB (gastrointestinal bleeding) Active Problems:   Acute renal failure superimposed on stage 3 chronic kidney disease ([facility])   Hyperlipidemia   Atrial fibrillation ([place])   DM type 2, uncontrolled, with renal complications ([facility])   Symptomatic anemia   Pleural effusion on right   Acute respiratory failure with hypoxia ([place])   Acute CHF   Essential hypertension   LOS: 1 day

## 2018-02-21 LAB — CBC
HEMATOCRIT: 27.2 % — AB (ref 39.0–52.0)
Hemoglobin: 7.8 g/dL — ABNORMAL LOW (ref 13.0–17.0)
MCH: 23.9 pg — AB (ref 26.0–34.0)
MCHC: 28.7 g/dL — AB (ref 30.0–36.0)
MCV: 83.4 fL (ref 80.0–100.0)
Platelets: 207 10*3/uL (ref 150–400)
RBC: 3.26 MIL/uL — ABNORMAL LOW (ref 4.22–5.81)
RDW: 15.6 % — ABNORMAL HIGH (ref 11.5–15.5)
WBC: 8.3 10*3/uL (ref 4.0–10.5)
nRBC: 0 % (ref 0.0–0.2)

## 2018-02-21 LAB — BASIC METABOLIC PANEL
Anion gap: 5 (ref 5–15)
BUN: 29 mg/dL — AB (ref 8–23)
CO2: 35 mmol/L — AB (ref 22–32)
CREATININE: 2.24 mg/dL — AB (ref 0.61–1.24)
Calcium: 9.5 mg/dL (ref 8.9–10.3)
Chloride: 98 mmol/L (ref 98–111)
GFR calc Af Amer: 31 mL/min — ABNORMAL LOW (ref >60)
GFR calc non Af Amer: 27 mL/min — ABNORMAL LOW (ref >60)
Glucose, Bld: 104 mg/dL — ABNORMAL HIGH (ref 70–99)
Potassium: 3.2 mmol/L — ABNORMAL LOW (ref 3.5–5.1)
Sodium: 138 mmol/L (ref 135–145)

## 2018-02-21 LAB — GLUCOSE, CAPILLARY
GLUCOSE-CAPILLARY: 123 mg/dL — AB (ref 70–99)
Glucose-Capillary: 102 mg/dL — ABNORMAL HIGH (ref 70–99)
Glucose-Capillary: 118 mg/dL — ABNORMAL HIGH (ref 70–99)
Glucose-Capillary: 109 mg/dL — ABNORMAL HIGH (ref 70–99)

## 2018-02-21 MED ORDER — PEG-KCL-NACL-NASULF-NA ASC-C 100 G PO SOLR: 1.0000 | Freq: Once | ORAL | Status: DC

## 2018-02-21 MED ORDER — BISACODYL 5 MG PO TBEC
20.0000 mg | DELAYED_RELEASE_TABLET | Freq: Once | ORAL | Status: AC
  Administered 2018-02-21: 20 mg via ORAL
  Filled 2018-02-21: qty 4

## 2018-02-21 MED ORDER — PEG-KCL-NACL-NASULF-NA ASC-C 100 G PO SOLR
0.5000 | Freq: Once | ORAL | Status: AC
  Administered 2018-02-22: 1 g via ORAL

## 2018-02-21 MED ORDER — PEG-KCL-NACL-NASULF-NA ASC-C 100 G PO SOLR
0.5000 | Freq: Once | ORAL | Status: AC
  Administered 2018-02-21: 100 g via ORAL
  Filled 2018-02-21 (×2): qty 1

## 2018-02-21 MED ORDER — METOCLOPRAMIDE HCL 5 MG/ML IJ SOLN
10.0000 mg | Freq: Once | INTRAMUSCULAR | Status: AC
  Administered 2018-02-22: 10 mg via INTRAVENOUS
  Filled 2018-02-21: qty 2

## 2018-02-21 MED ORDER — METOCLOPRAMIDE HCL 5 MG/ML IJ SOLN
10.0000 mg | Freq: Once | INTRAMUSCULAR | Status: AC
  Administered 2018-02-21: 10 mg via INTRAVENOUS
  Filled 2018-02-21: qty 2

## 2018-02-21 NOTE — Progress Notes (Signed)
Daily Rounding Note  02/21/2018, 11:25 AM  LOS: 2 days   SUBJECTIVE:   Chief complaint: tarry stools, anemia  Pt no longer felling SOB.  No BM's since admission.   EF is 55 to 60%.  Moderate pulm htn.  Difficult to assay for diastolic dysfx due to A fib.    OBJECTIVE:         Vital signs in last 24 hours:    Temp:  [97.5 F (36.4 C)-98.2 F (36.8 C)] 97.5 F (36.4 C) (11/07 1028) Pulse Rate:  [79-125] 117 (11/07 1028) Resp:  [18] 18 (11/07 1028) BP: (107-142)/(40-70) 107/51 (11/07 1028) SpO2:  [90 %-100 %] 100 % (11/07 1028) Last BM Date: 02/20/18 Filed Weights   02/19/18 1636  Weight: 108.9 kg   General: looks better, no distress   Heart: a fib, rate 90s Chest: diminished global BS, clear.  No cough or dyspnea Abdomen: soft, protuberant,  Active BS.  NT  Extremities: pedal ankle edema.   Neuro/Psych:  Calm.  Oriented x 3.  Appropriate.  No weakness or tremor observed.    Intake/Output from previous day: 11/06 0701 - 11/07 0700 In: 720 [P.O.:720] Out: 2750 [Urine:2750]  Intake/Output this shift: Total I/O In: 120 [P.O.:120] Out: 375 [Urine:375]  Lab Results: Recent Labs    02/19/18 2257 02/20/18 0602 02/20/18 1447 02/21/18 0535  WBC 8.7 8.4  --  8.3  HGB 7.2* 8.0* 8.1* 7.8*  HCT 24.3* 28.8* 27.4* 27.2*  PLT 206 218  --  207   BMET Recent Labs    02/19/18 1709 02/20/18 0909 02/21/18 0535  NA 136 138 138  K 4.0 3.4* 3.2*  CL 99 100 98  CO2 31 30 35*  GLUCOSE 143* 103* 104*  BUN 29* 30* 29*  CREATININE 1.82* 2.02* 2.24*  CALCIUM 9.4 9.4 9.5   LFT Recent Labs    02/19/18 2257  PROT 5.6*  ALBUMIN 3.1*  AST 14*  ALT 14  ALKPHOS 55  BILITOT 1.2  BILIDIR 0.3*  IBILI 0.9   PT/INR Recent Labs    02/19/18 1753  LABPROT 12.4  INR 0.93   Hepatitis Panel No results for input(s): HEPBSAG, HCVAB, HEPAIGM, HEPBIGM in the last 72 hours.  Studies/Results: US Abdomen  Limited  Result Date: 02/20/2018 CLINICAL DATA:  Abdominal swelling. EXAM: ULTRASOUND ABDOMEN LIMITED RIGHT UPPER QUADRANT COMPARISON:  None. FINDINGS: Gallbladder: There are multiple stones in the gallbladder. There is gallbladder wall thickening. There is a small amount of pericholecystic fluid. Negative sonographic Murphy's sign. Common bile duct: Diameter: 3.1 mm, normal. Liver: No focal lesion identified. Diffuse slight increased echogenicity of the liver parenchyma. Prominent right pleural effusion. Small amount of ascites in the right upper quadrant. Portal vein is patent on color Doppler imaging with normal direction of blood flow towards the liver. IMPRESSION: 1. Small amount of ascites. 2. Prominent right pleural effusion. 3. Cholelithiasis with thickening of the gallbladder wall with a negative sonographic Murphy's sign. 4. Increased echogenicity of the liver parenchyma consistent with hepatic steatosis. Electronically Signed   By: Lorriane Shire M.D.   On: 02/20/2018 10:40   Dg Chest Port 1 View  Result Date: 02/19/2018 CLINICAL DATA:  Short of breath EXAM: PORTABLE CHEST 1 VIEW COMPARISON:  09/29/2009 FINDINGS: Moderate right pleural effusion. Dense airspace disease at the right middle lobe and right base. Cardiomediastinal silhouette is largely obscured. There is vascular congestion. No pneumothorax. IMPRESSION: Moderate right pleural effusion with dense atelectasis or  pneumonia at the right middle lobe and right base. Vascular congestion. Electronically Signed   By: Donavan Foil M.D.   On: 02/19/2018 17:10    ASSESMENT:   *   GI bleeding.  tarry stools, none yesterday or today.  .   R/o ulcer, AVMs, neoplasia, portal htn related bleeding.    *    Iron deficiency anemia.  Progressive over several months.  S/P 1 U PRBCs.   Hgb relatively stable.    *   Cologuard + in 11/2016.  Pt did not follow through with colonoscopy.    *   Abdominal swelling, early satiety.  Ultrasound with  small ascites, fatty liver, gallstones, thickened GB wall, negative Murphy's sign No hx elevated LFTs, right pleural effusion. LFTS,  platelets and coags normal.   No abdominal pain.    *   Chronic A fib.  Stopped xarelto (previously on Eliquis, stopped this on his own and NP started Xarelto in 08/2017) on his own. Chads vasc score 3 in 03/2016.     *   Elevated BNP, chronic LE edema, right pleural effusion.   Echo with EF 55 to 60%.  Moderate pulm htn.  Difficult to assay for diastolic dysfx due to A fib. O2 sats 100%.  No tachy.  Soft BPs but per pt and RN he is doing well.     *   DM 2.  Not on insulin.    *   AKI.  Baseline stage 3 CKD.    PLAN   *   Plan colon and EGD for 6606 set for 10:15 tomorrow. Prep, clear diet orders written.  D/w pt and his daughter.    *  Repeat Hgb and BMET in AM.  Continue empiric BID PO Protonix.      Azucena Freed  02/21/2018, 11:25 AM Phone [phone]

## 2018-02-21 NOTE — Progress Notes (Signed)
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  Admitted from home with bilateral edema abdominal pain and some dark stool-abdominal distention X 2 months, S OB X 2 weeks  In the ED found to have + FOBT hemoglobin 7.4 BNP 670 and INR 0.9 renal function worsening no tachycardia no tachypnea CXR = moderate R fluid effusion  A & Plan Undifferentiated GI bleed-hemoglobin 13 on 08/2017--> 7.4 on admission 11/5 Patient is currently on a low-salt diet-GI planning scope at some point  Acute blood loss anemia likely from GI bleeding-transfuse 2 units PRBC-r stable at this time  Acute respiratory failure secondary to CHF-last EF 60-65%-given Lasix 1 60 X1 Further diuresis limited by rising creatinine  Fatty liver-ultrasound performed confirms ascites, fatty liver-dye planning diagnostic paracentesis  Acute renal failure with worsening creatinine-diuresis Lasix IV twice daily 40 held-keep volumes even today-if further worsening will need to speak with nephrology  Mild hypokalemia-holding replacement given mild rising creatinine and risk for worsening  Chronic kidney disease stage III, prior history of carcinoma of kidney status post resection 2011-see above discussion-monitor  HTN-all meds on hold  Atrial fibrillation chads score >3 stopped Xarelto because of easy bruising about 1 year prior-we will need discussion going forward once further evaluation of GI tract is performed  DM TY 2 uncontrolled last A1c 7.0-sugars are between 102 and 123  Morbid obesity BMI 36    DVT prophylaxis: LOvenoex  Code Status: ip   Family Communication: d/w daughter at bedside   Disposition  Plan:  inpatient    Verlon Au, MD  [facility] Direct contact: [phone] --Via amion app OR  --[email or web address]; password [email or web address]  7PM-7AM contact night coverage as above 02/21/2018, 10:47 AM  LOS: 2 days   Consultants:  GI  Procedures:  Await echo  Antimicrobials:   n  Interval history/Subjective:  Doing okay no cardiopulmonary distress Feels overall somewhat improved No shortness of breath now at baseline although still on oxygen No chest pain Still feels swollen but less so than prior   Objective:  Vitals:  Vitals:   02/21/18 0520 02/21/18 1028  BP: (!) 125/40 (!) 107/51  Pulse: 90 (!) 117  Resp: 18 18  Temp: 98 F (36.7 C) (!) 97.5 F (36.4 C)  SpO2: 94% 100%    Exam:  . Obese pleasant . Chest is clear no added sound . Abdomen obese with shifting dullness . Grade 1-2 lower extremity edema has a bleb over the right shin . Neurologically intact . Power 5/5   I have personally reviewed the following:   Labs:  Hemoglobin down from eight 7.8  Creatinine up from 30/2 0.0-20 9/2.2 CO2 35 potassium 3.2  Imaging studies: ECHO Pending - The patient appeared to be in atrial fibrillation. Normal LV size   with EF 55-60%. Normal RV size and systolic function. Biatrial   enlargement. Moderate pulmonary hypertension with dilated IVC.    Mild MR.  Medical tests:  Resolved    Test discussed with performing physician:  n  Decision to obtain old records:  n  Review and summation of old records:  nn  Scheduled Meds: . sodium chloride   Intravenous Once  . insulin aspart  0-5 Units Subcutaneous QHS  . insulin aspart  0-9 Units Subcutaneous TID WC  . multivitamin with minerals  1 tablet Oral Daily  . pantoprazole  40 mg Oral BID  . rosuvastatin  5 mg Oral Daily   Continuous Infusions:   Principal Problem:   GIB (gastrointestinal bleeding) Active Problems:   Acute renal failure superimposed on stage 3 chronic kidney disease (HCC)    Hyperlipidemia   Atrial fibrillation ([place])   DM type 2, uncontrolled, with renal complications (HCC)   Symptomatic anemia   Pleural effusion on right   Acute respiratory failure with hypoxia ([place])   Acute CHF   Essential hypertension   LOS: 2 days

## 2018-02-21 NOTE — H&P (View-Only) (Signed)
Daily Rounding Note  02/21/2018, 11:25 AM  LOS: 2 days   SUBJECTIVE:   Chief complaint: tarry stools, anemia  Pt no longer felling SOB.  No BM's since admission.   EF is 55 to 60%.  Moderate pulm htn.  Difficult to assay for diastolic dysfx due to A fib.    OBJECTIVE:         Vital signs in last 24 hours:    Temp:  [97.5 F (36.4 C)-98.2 F (36.8 C)] 97.5 F (36.4 C) (11/07 1028) Pulse Rate:  [79-125] 117 (11/07 1028) Resp:  [18] 18 (11/07 1028) BP: (107-142)/(40-70) 107/51 (11/07 1028) SpO2:  [90 %-100 %] 100 % (11/07 1028) Last BM Date: 02/20/18 Filed Weights   02/19/18 1636  Weight: 108.9 kg   General: looks better, no distress   Heart: a fib, rate 90s Chest: diminished global BS, clear.  No cough or dyspnea Abdomen: soft, protuberant,  Active BS.  NT  Extremities: pedal ankle edema.   Neuro/Psych:  Calm.  Oriented x 3.  Appropriate.  No weakness or tremor observed.    Intake/Output from previous day: 11/06 0701 - 11/07 0700 In: 720 [P.O.:720] Out: 2750 [Urine:2750]  Intake/Output this shift: Total I/O In: 120 [P.O.:120] Out: 375 [Urine:375]  Lab Results: Recent Labs    02/19/18 2257 02/20/18 0602 02/20/18 1447 02/21/18 0535  WBC 8.7 8.4  --  8.3  HGB 7.2* 8.0* 8.1* 7.8*  HCT 24.3* 28.8* 27.4* 27.2*  PLT 206 218  --  207   BMET Recent Labs    02/19/18 1709 02/20/18 0909 02/21/18 0535  NA 136 138 138  K 4.0 3.4* 3.2*  CL 99 100 98  CO2 31 30 35*  GLUCOSE 143* 103* 104*  BUN 29* 30* 29*  CREATININE 1.82* 2.02* 2.24*  CALCIUM 9.4 9.4 9.5   LFT Recent Labs    02/19/18 2257  PROT 5.6*  ALBUMIN 3.1*  AST 14*  ALT 14  ALKPHOS 55  BILITOT 1.2  BILIDIR 0.3*  IBILI 0.9   PT/INR Recent Labs    02/19/18 1753  LABPROT 12.4  INR 0.93   Hepatitis Panel No results for input(s): HEPBSAG, HCVAB, HEPAIGM, HEPBIGM in the last 72 hours.  Studies/Results: US Abdomen  Limited  Result Date: 02/20/2018 CLINICAL DATA:  Abdominal swelling. EXAM: ULTRASOUND ABDOMEN LIMITED RIGHT UPPER QUADRANT COMPARISON:  None. FINDINGS: Gallbladder: There are multiple stones in the gallbladder. There is gallbladder wall thickening. There is a small amount of pericholecystic fluid. Negative sonographic Murphy's sign. Common bile duct: Diameter: 3.1 mm, normal. Liver: No focal lesion identified. Diffuse slight increased echogenicity of the liver parenchyma. Prominent right pleural effusion. Small amount of ascites in the right upper quadrant. Portal vein is patent on color Doppler imaging with normal direction of blood flow towards the liver. IMPRESSION: 1. Small amount of ascites. 2. Prominent right pleural effusion. 3. Cholelithiasis with thickening of the gallbladder wall with a negative sonographic Murphy's sign. 4. Increased echogenicity of the liver parenchyma consistent with hepatic steatosis. Electronically Signed   By: Lorriane Shire M.D.   On: 02/20/2018 10:40   Dg Chest Port 1 View  Result Date: 02/19/2018 CLINICAL DATA:  Short of breath EXAM: PORTABLE CHEST 1 VIEW COMPARISON:  09/29/2009 FINDINGS: Moderate right pleural effusion. Dense airspace disease at the right middle lobe and right base. Cardiomediastinal silhouette is largely obscured. There is vascular congestion. No pneumothorax. IMPRESSION: Moderate right pleural effusion with dense atelectasis or  pneumonia at the right middle lobe and right base. Vascular congestion. Electronically Signed   By: Donavan Foil M.D.   On: 02/19/2018 17:10    ASSESMENT:   *   GI bleeding.  tarry stools, none yesterday or today.  .   R/o ulcer, AVMs, neoplasia, portal htn related bleeding.    *    Iron deficiency anemia.  Progressive over several months.  S/P 1 U PRBCs.   Hgb relatively stable.    *   Cologuard + in 11/2016.  Pt did not follow through with colonoscopy.    *   Abdominal swelling, early satiety.  Ultrasound with  small ascites, fatty liver, gallstones, thickened GB wall, negative Murphy's sign No hx elevated LFTs, right pleural effusion. LFTS,  platelets and coags normal.   No abdominal pain.    *   Chronic A fib.  Stopped xarelto (previously on Eliquis, stopped this on his own and NP started Xarelto in 08/2017) on his own. Chads vasc score 3 in 03/2016.     *   Elevated BNP, chronic LE edema, right pleural effusion.   Echo with EF 55 to 60%.  Moderate pulm htn.  Difficult to assay for diastolic dysfx due to A fib. O2 sats 100%.  No tachy.  Soft BPs but per pt and RN he is doing well.     *   DM 2.  Not on insulin.    *   AKI.  Baseline stage 3 CKD.    PLAN   *   Plan colon and EGD for 0263 set for 10:15 tomorrow. Prep, clear diet orders written.  D/w pt and his daughter.    *  Repeat Hgb and BMET in AM.  Continue empiric BID PO Protonix.      Azucena Freed  02/21/2018, 11:25 AM Phone [phone]

## 2018-02-22 ENCOUNTER — Inpatient Hospital Stay (HOSPITAL_COMMUNITY): Payer: Medicare Other

## 2018-02-22 ENCOUNTER — Inpatient Hospital Stay (HOSPITAL_COMMUNITY): Payer: Medicare Other | Admitting: Anesthesiology

## 2018-02-22 ENCOUNTER — Encounter (HOSPITAL_COMMUNITY): Payer: Self-pay | Admitting: *Deleted

## 2018-02-22 ENCOUNTER — Encounter (HOSPITAL_COMMUNITY)
Admission: EM | Disposition: A | Discharge status: Home / Self Care | Payer: Self-pay | Source: Home / Self Care | Attending: Family Medicine

## 2018-02-22 DIAGNOSIS — D124 Benign neoplasm of descending colon: Secondary | ICD-10-CM

## 2018-02-22 DIAGNOSIS — K2901 Acute gastritis with bleeding: Secondary | ICD-10-CM

## 2018-02-22 DIAGNOSIS — K635 Polyp of colon: Secondary | ICD-10-CM

## 2018-02-22 HISTORY — PX: COLONOSCOPY WITH PROPOFOL: SHX5780

## 2018-02-22 HISTORY — PX: ESOPHAGOGASTRODUODENOSCOPY (EGD) WITH PROPOFOL: SHX5813

## 2018-02-22 HISTORY — PX: POLYPECTOMY: SHX5525

## 2018-02-22 LAB — BASIC METABOLIC PANEL
Anion gap: 9 (ref 5–15)
BUN: 24 mg/dL — AB (ref 8–23)
CO2: 32 mmol/L (ref 22–32)
Calcium: 9.7 mg/dL (ref 8.9–10.3)
Chloride: 98 mmol/L (ref 98–111)
Creatinine, Ser: 2.25 mg/dL — ABNORMAL HIGH (ref 0.61–1.24)
GFR calc Af Amer: 31 mL/min — ABNORMAL LOW (ref >60)
GFR calc non Af Amer: 27 mL/min — ABNORMAL LOW (ref >60)
GLUCOSE: 101 mg/dL — AB (ref 70–99)
POTASSIUM: 3.9 mmol/L (ref 3.5–5.1)
Sodium: 139 mmol/L (ref 135–145)

## 2018-02-22 LAB — GLUCOSE, CAPILLARY
GLUCOSE-CAPILLARY: 107 mg/dL — AB (ref 70–99)
Glucose-Capillary: 93 mg/dL (ref 70–99)
Glucose-Capillary: 167 mg/dL — ABNORMAL HIGH (ref 70–99)

## 2018-02-22 LAB — PREPARE RBC (CROSSMATCH)

## 2018-02-22 LAB — HEMOGLOBIN AND HEMATOCRIT, BLOOD
HEMATOCRIT: 26.9 % — AB (ref 39.0–52.0)
HEMATOCRIT: 29.9 % — AB (ref 39.0–52.0)
HEMOGLOBIN: 7.6 g/dL — AB (ref 13.0–17.0)
Hemoglobin: 8.8 g/dL — ABNORMAL LOW (ref 13.0–17.0)

## 2018-02-22 SURGERY — ESOPHAGOGASTRODUODENOSCOPY (EGD) WITH PROPOFOL
Anesthesia: General

## 2018-02-22 MED ORDER — ROSUVASTATIN CALCIUM 10 MG PO TABS
5.0000 mg | ORAL_TABLET | Freq: Every day | ORAL | Status: DC
  Administered 2018-02-22 – 2018-02-25 (×4): 5 mg via ORAL
  Filled 2018-02-22 (×4): qty 1

## 2018-02-22 MED ORDER — PROPOFOL 10 MG/ML IV BOLUS
INTRAVENOUS | Status: DC | PRN
  Administered 2018-02-22: 160 mg via INTRAVENOUS

## 2018-02-22 MED ORDER — DIPHENHYDRAMINE HCL 25 MG PO CAPS
25.0000 mg | ORAL_CAPSULE | Freq: Once | ORAL | Status: AC
  Administered 2018-02-24: 25 mg via ORAL
  Filled 2018-02-22: qty 1

## 2018-02-22 MED ORDER — FUROSEMIDE 10 MG/ML IJ SOLN
20.0000 mg | Freq: Once | INTRAMUSCULAR | Status: AC
  Administered 2018-02-24: 20 mg via INTRAVENOUS
  Filled 2018-02-22: qty 2

## 2018-02-22 MED ORDER — PANTOPRAZOLE SODIUM 40 MG PO TBEC
40.0000 mg | DELAYED_RELEASE_TABLET | Freq: Two times a day (BID) | ORAL | Status: DC
  Administered 2018-02-22 – 2018-02-25 (×7): 40 mg via ORAL
  Filled 2018-02-22 (×7): qty 1

## 2018-02-22 MED ORDER — EPHEDRINE SULFATE-NACL 50-0.9 MG/10ML-% IV SOSY
PREFILLED_SYRINGE | INTRAVENOUS | Status: DC | PRN
  Administered 2018-02-22 (×5): 10 mg via INTRAVENOUS

## 2018-02-22 MED ORDER — SODIUM CHLORIDE 0.9 % IV SOLN
INTRAVENOUS | Status: DC | PRN
  Administered 2018-02-22: 75 ug/min via INTRAVENOUS

## 2018-02-22 MED ORDER — LIDOCAINE 2% (20 MG/ML) 5 ML SYRINGE
INTRAMUSCULAR | Status: DC | PRN
  Administered 2018-02-22: 100 mg via INTRAVENOUS

## 2018-02-22 MED ORDER — ADULT MULTIVITAMIN W/MINERALS CH
1.0000 | ORAL_TABLET | Freq: Every day | ORAL | Status: DC
  Administered 2018-02-22 – 2018-02-25 (×4): 1 via ORAL
  Filled 2018-02-22 (×5): qty 1

## 2018-02-22 MED ORDER — SODIUM CHLORIDE 0.9 % IV SOLN
INTRAVENOUS | Status: DC | PRN
  Administered 2018-02-22: 11:00:00 via INTRAVENOUS

## 2018-02-22 MED ORDER — SODIUM CHLORIDE 0.9% IV SOLUTION: Freq: Once | INTRAVENOUS | Status: DC

## 2018-02-22 MED ORDER — ACETAMINOPHEN 325 MG PO TABS: 650.0000 mg | ORAL_TABLET | Freq: Once | ORAL | Status: DC

## 2018-02-22 MED ORDER — PHENYLEPHRINE 40 MCG/ML (10ML) SYRINGE FOR IV PUSH (FOR BLOOD PRESSURE SUPPORT)
PREFILLED_SYRINGE | INTRAVENOUS | Status: DC | PRN
  Administered 2018-02-22: 120 ug via INTRAVENOUS
  Administered 2018-02-22: 80 ug via INTRAVENOUS
  Administered 2018-02-22: 120 ug via INTRAVENOUS
  Administered 2018-02-22: 80 ug via INTRAVENOUS

## 2018-02-22 MED ORDER — SUCCINYLCHOLINE CHLORIDE 20 MG/ML IJ SOLN
INTRAMUSCULAR | Status: DC | PRN
  Administered 2018-02-22: 160 mg via INTRAVENOUS

## 2018-02-22 SURGICAL SUPPLY — 25 items

## 2018-02-22 NOTE — Interval H&P Note (Signed)
History and Physical Interval Note:  02/22/2018 9:55 AM  Douglas Soto  has presented today for surgery, with the diagnosis of Anemia.  Tarry stools.  Positive Cologuard.  The various methods of treatment have been discussed with the patient and family. After consideration of risks, benefits and other options for treatment, the patient has consented to  Procedure(s): ESOPHAGOGASTRODUODENOSCOPY (EGD) WITH PROPOFOL (N/A) COLONOSCOPY WITH PROPOFOL (N/A) as a surgical intervention .  The patient's history has been reviewed, patient examined, no change in status, stable for surgery.  I have reviewed the patient's chart and labs.  Questions were answered to the patient's satisfaction.     Milus Banister

## 2018-02-22 NOTE — Anesthesia Procedure Notes (Signed)
Procedure Name: Intubation Date/Time: 02/22/2018 11:10 AM Performed by: Julieta Bellini, CRNA Pre-anesthesia Checklist: Patient identified, Emergency Drugs available, Suction available and Patient being monitored Patient Re-evaluated:Patient Re-evaluated prior to induction Oxygen Delivery Method: Circle system utilized Preoxygenation: Pre-oxygenation with 100% oxygen Ventilation: Mask ventilation without difficulty and Oral airway inserted - appropriate to patient size Laryngoscope Size: Mac and 4 Grade View: Grade I Tube type: Oral Tube size: 7.5 mm Number of attempts: 1 Airway Equipment and Method: Stylet Placement Confirmation: ETT inserted through vocal cords under direct vision,  positive ETCO2 and breath sounds checked- equal and bilateral Secured at: 22 cm Tube secured with: Tape Dental Injury: Teeth and Oropharynx as per pre-operative assessment

## 2018-02-22 NOTE — Anesthesia Postprocedure Evaluation (Signed)
Anesthesia Post Note  Patient: Vivianne Spence  Procedure(s) Performed: ESOPHAGOGASTRODUODENOSCOPY (EGD) WITH PROPOFOL (N/A ) COLONOSCOPY WITH PROPOFOL (N/A ) POLYPECTOMY     Patient location during evaluation: PACU Anesthesia Type: General Level of consciousness: awake and alert Pain management: pain level controlled Vital Signs Assessment: post-procedure vital signs reviewed and stable Respiratory status: spontaneous breathing, nonlabored ventilation, respiratory function stable and patient connected to nasal cannula oxygen Cardiovascular status: blood pressure returned to baseline and stable Postop Assessment: no apparent nausea or vomiting Anesthetic complications: no    Last Vitals:  Vitals:   02/22/18 1300 02/22/18 1305  BP: (!) 121/36 (!) 121/36  Pulse: 95 (!) 101  Resp: 16 (!) 21  Temp:    SpO2: 92% 95%    Last Pain:  Vitals:   02/22/18 1300  TempSrc:   PainSc: 0-No pain                 Tashya Alberty

## 2018-02-22 NOTE — Progress Notes (Signed)
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  Admitted from home with bilateral edema abdominal pain and some dark stool-abdominal distention X 2 months, S OB X 2 weeks  In the ED found to have + FOBT hemoglobin 7.4 BNP 670 and INR 0.9 renal function worsening no tachycardia no tachypnea CXR = moderate R fluid effusion  A & Plan Undifferentiated GI bleed-hemoglobin 13 on 08/2017--> 7.4 on admission 11/5 S/p scope as below-no overt cause other than polpys-defer AC to GI-not sure if SB may need capsule vs not  Acute blood loss anemia likely from GI bleeding-transfuse1 U prbc and reassess counts going forward  had 1 U prbc earlier this admit on 11/5  Acute respiratory failure secondary to CHF-last EF 60-65%-given Lasix 1 60 X1 Further diuresis limited by rising creatinine  Pleural effusion on admit-rpt 2 vw cxr to confirm-get desat screen-reasonable if present to work-up with thoracentesis if still present-suspect from HF 2/2 afib  Fatty liver-ultrasound performed confirms ascites, fatty liver-per GI  Acute renal failure with worsening creatinine-diuresis Lasix IV twice daily 40 held-keep volumes even-creat stabilized-hope will resolve  Mild hypokalemia-resolved  Chronic kidney disease stage III, prior history of carcinoma of kidney status post resection 2011-see above discussion-monitor  HTN-all meds on hold  Atrial fibrillation chads score >3 stopped Xarelto because of easy bruising about 1 year prior-we will need discussion going forward once further evaluation of GI tract is performed  DM TY 2 uncontrolled last A1c 7.0-sugars 93-101  Morbid obesity BMI 36     DVT prophylaxis: LOvenoex  Code Status: ip   Family Communication: d/w daughter at bedside   Disposition Plan:  inpatient    Verlon Au, MD  [facility] Direct contact: [phone] --Via amion app OR  --[email or web address]; password [email or web address]  7PM-7AM contact night coverage as above 02/22/2018, 3:25 PM  LOS: 3 days   Consultants:  GI  Procedures:  Await echo  Antimicrobials:   n  Interval history/Subjective:  Fair No so No fever no chills just back from Endo--has diet order in nad currently States LE swelling resolved No cp   Objective:  Vitals:  Vitals:   02/22/18 1300 02/22/18 1305  BP: (!) 121/36 (!) 121/36  Pulse: 95 (!) 101  Resp: 16 (!) 21  Temp:    SpO2: 92% 95%    Exam:  . Obese pleasant . Chest is clear no added sound . Abdomen obese  . Grade 1-2 lower extremity edema has a bleb over the right shin . Neurologically intact . Power 5/5   I have personally reviewed the following:   Labs:  Hemoglobin down from eight 7.8-->7.6  Creatinine up from 30/2.0 on admit to 34/2.2 and stable  Imaging studies: ECHO Pending - The patient appeared to be in atrial fibrillation. Normal LV size   with EF 55-60%. Normal RV size and systolic function. Biatrial   enlargement. Moderate pulmonary hypertension with dilated IVC.    Mild MR.  Medical tests:  Colonoscopy Five sessile polyps were found in the descending colon, ascending colon       and cecum. The polyps were 3 to 10 mm in size. These polyps were removed       with a cold snare. Resection and retrieval  were complete.      A 13 mm polyp was found in the ascending colon. The polyp was sessile.       The polyp was removed with a hot snare. Resection and retrieval were       complete.      Multiple small-mouthed diverticula were found in the left colon.      The exam was otherwise without abnormality on direct and retroflexion   Endocopy Findings:      The esophagus was normal.      The stomach  was normal.      The examined duodenum was normal.  Test discussed with performing physician:  n  Decision to obtain old records:  n  Review and summation of old records:  nn  Scheduled Meds: . sodium chloride   Intravenous Once  . sodium chloride   Intravenous Once  . acetaminophen  650 mg Oral Once  . diphenhydrAMINE  25 mg Oral Once  . furosemide  20 mg Intravenous Once  . insulin aspart  0-5 Units Subcutaneous QHS  . insulin aspart  0-9 Units Subcutaneous TID WC  . multivitamin with minerals  1 tablet Oral Daily  . pantoprazole  40 mg Oral BID  . rosuvastatin  5 mg Oral Daily   Continuous Infusions:   Principal Problem:   GIB (gastrointestinal bleeding) Active Problems:   Acute renal failure superimposed on stage 3 chronic kidney disease (HCC)   Hyperlipidemia   Atrial fibrillation ([place])   DM type 2, uncontrolled, with renal complications (HCC)   Symptomatic anemia   Pleural effusion on right   Acute respiratory failure with hypoxia (HCC)   Acute CHF   Essential hypertension   Polyp of descending colon   LOS: 3 days

## 2018-02-22 NOTE — Op Note (Signed)
[facility] Patient Name: Douglas Soto Procedure Date : 02/22/2018 MRN: [id number] Attending MD: Milus Banister , MD Date of Birth: 07/16/1940 CSN: [id number] Age: 77 Admit Type: Inpatient Procedure:                Upper GI endoscopy Indications:              Melena, normocytic anemia Providers:                Milus Banister, MD, Cleda Daub, RN, Cherylynn Ridges, Technician, Wyatt Haste Referring MD:              Medicines:                Monitored Anesthesia Care Complications:            No immediate complications. Estimated blood loss:                            None. Estimated Blood Loss:     Estimated blood loss: none. Procedure:                Pre-Anesthesia Assessment:                           - Prior to the procedure, a History and Physical                            was performed, and patient medications and                            allergies were reviewed. The patient's tolerance of                            previous anesthesia was also reviewed. The risks                            and benefits of the procedure and the sedation                            options and risks were discussed with the patient.                            All questions were answered, and informed consent                            was obtained. Prior Anticoagulants: The patient has                            taken no previous anticoagulant or antiplatelet                            agents. ASA Grade Assessment: IV - A patient with  severe systemic disease that is a constant threat                            to life. After reviewing the risks and benefits,                            the patient was deemed in satisfactory condition to                            undergo the procedure.                           - Prior to the procedure, a History and Physical                            was performed, and patient medications  and                            allergies were reviewed. The patient's tolerance of                            previous anesthesia was also reviewed. The risks                            and benefits of the procedure and the sedation                            options and risks were discussed with the patient.                            All questions were answered, and informed consent                            was obtained. Prior Anticoagulants: The patient has                            taken no previous anticoagulant or antiplatelet                            agents. ASA Grade Assessment: IV - A patient with                            severe systemic disease that is a constant threat                            to life. After reviewing the risks and benefits,                            the patient was deemed in satisfactory condition to                            undergo the procedure.  After obtaining informed consent, the endoscope was                            passed under direct vision. Throughout the                            procedure, the patient's blood pressure, pulse, and                            oxygen saturations were monitored continuously. The                            GIF-H190 ([id number]) Olympus Adult EGD was introduced                            through the mouth, and advanced to the second part                            of duodenum. The upper GI endoscopy was                            accomplished without difficulty. The patient                            tolerated the procedure well. Scope In: Scope Out: Findings:      The esophagus was normal.      The stomach was normal.      The examined duodenum was normal. Impression:               - This EGD and the colonoscopy just prior do not                            adequately explain his overall clinical picture. I                            am concerned about potential non-GI issue here.                             Should consider pulmonary, cardiology consultation.                            He is not having overt GI bleeding and for now no                            further GI testing is recommended. The 'prominant                            right pleural effusion' may need sampling. He has                            small amount of ascites, I am not sure if that  could be sampled as well. Of note no features of                            cirrhosis by [place] or labs (normal plts, normal INR,                            normal LFTs). Creatine is rising. Recommendation:           - Return patient to hospital ward for ongoing care.                           - [facility] will return to see him on Monday,                            please call sooner if there is concern overt                            bleeding. Procedure Code(s):        --- Professional ---                           [phone], Esophagogastroduodenoscopy, flexible,                            transoral; diagnostic, including collection of                            specimen(s) by brushing or washing, when performed                            (separate procedure) Diagnosis Code(s):        --- Professional ---                           K92.1, Melena (includes Hematochezia) CPT copyright 2018 American Medical Association. All rights reserved. The codes documented in this report are preliminary and upon coder review may  be revised to meet current compliance requirements. Milus Banister, MD 02/22/2018 11:47:11 AM This report has been signed electronically. Number of Addenda: 0

## 2018-02-22 NOTE — Anesthesia Preprocedure Evaluation (Signed)
Anesthesia Evaluation  Patient identified by MRN, date of birth, ID band Patient awake    Reviewed: Allergy & Precautions, H&P , NPO status , Patient's Chart, lab work & pertinent test results, reviewed documented beta blocker date and time   Airway Mallampati: III  TM Distance: >3 FB Neck ROM: full   Comment: SMALL MOUTH Dental no notable dental hx. (+) Edentulous Upper, Edentulous Lower   Pulmonary former smoker,    Pulmonary exam normal breath sounds clear to auscultation       Cardiovascular Exercise Tolerance: Good hypertension, Pt. on medications and Pt. on home beta blockers +CHF   Rhythm:regular Rate:Normal  ECHO 11/19 The patient appeared to be in atrial fibrillation. Normal LV size   with EF 55-60%. Normal RV size and systolic function. Biatrial   enlargement. Moderate pulmonary hypertension with dilated IVC.   Mild MR.   Neuro/Psych PSYCHIATRIC DISORDERS Depression negative neurological ROS     GI/Hepatic negative GI ROS, Neg liver ROS,   Endo/Other  negative endocrine ROSdiabetes  Renal/GU CRFRenal disease  negative genitourinary   Musculoskeletal   Abdominal   Peds  Hematology  (+) Blood dyscrasia, anemia ,   Anesthesia Other Findings   Reproductive/Obstetrics negative OB ROS                             Anesthesia Physical Anesthesia Plan  ASA: III  Anesthesia Plan: General   Post-op Pain Management:    Induction: Intravenous  PONV Risk Score and Plan: 2 and Treatment may vary due to age or medical condition  Airway Management Planned: Mask, Natural Airway and Nasal Cannula  Additional Equipment:   Intra-op Plan:   Post-operative Plan:   Informed Consent: I have reviewed the patients History and Physical, chart, labs and discussed the procedure including the risks, benefits and alternatives for the proposed anesthesia with the patient or authorized  representative who has indicated his/her understanding and acceptance.   Dental Advisory Given  Plan Discussed with: CRNA, Anesthesiologist and Surgeon  Anesthesia Plan Comments:         Anesthesia Quick Evaluation

## 2018-02-22 NOTE — Progress Notes (Signed)
Pt and family concerned about diastolic BP being low. Messaged MD to make aware.   Eleanora Neighbor, RN

## 2018-02-22 NOTE — Care Management Important Message (Signed)
Important Message  Patient Details  Name: Douglas Soto MRN: [id number] Date of Birth: [birth year]   Medicare Important Message Given:  Yes    Leilan Bochenek [address]ine [address] 02/22/2018, 3:47 PM

## 2018-02-22 NOTE — Transfer of Care (Signed)
Immediate Anesthesia Transfer of Care Note  Patient: Douglas Soto  Procedure(s) Performed: ESOPHAGOGASTRODUODENOSCOPY (EGD) WITH PROPOFOL (N/A ) COLONOSCOPY WITH PROPOFOL (N/A ) POLYPECTOMY  Patient Location: Endoscopy Unit  Anesthesia Type:General  Level of Consciousness: awake, alert , oriented and patient cooperative  Airway & Oxygen Therapy: Patient Spontanous Breathing and Patient connected to face mask oxygen  Post-op Assessment: Report given to RN, Post -op Vital signs reviewed and stable and Patient moving all extremities X 4  Post vital signs: Reviewed and stable  Last Vitals:  Vitals Value Taken Time  BP 102/33 02/22/2018 11:55 AM  Temp 36.8 C 02/22/2018 11:55 AM  Pulse 105 02/22/2018 11:56 AM  Resp 18 02/22/2018 11:56 AM  SpO2 95 % 02/22/2018 11:56 AM  Vitals shown include unvalidated device data.  Last Pain:  Vitals:   02/22/18 1155  TempSrc: Axillary  PainSc: 0-No pain      Patients Stated Pain Goal: 1 (37/85/88 5027)  Complications: No apparent anesthesia complications

## 2018-02-22 NOTE — Op Note (Signed)
[facility] Patient Name: Douglas Soto Procedure Date : 02/22/2018 MRN: [id number] Attending MD: Milus Banister , MD Date of Birth: 05-13-1940 CSN: [id number] Age: 77 Admit Type: Inpatient Procedure:                Colonoscopy Indications:              Heme positive stool, Melena, normocytic anemia,                            cologuard + stool 2018 Providers:                Milus Banister, MD, Cleda Daub, RN, Cherylynn Ridges, Technician, Wyatt Haste Referring MD:              Medicines:                Monitored Anesthesia Care Complications:            No immediate complications. Estimated blood loss:                            None. Estimated Blood Loss:     Estimated blood loss: none. Procedure:                Pre-Anesthesia Assessment:                           - Prior to the procedure, a History and Physical                            was performed, and patient medications and                            allergies were reviewed. The patient's tolerance of                            previous anesthesia was also reviewed. The risks                            and benefits of the procedure and the sedation                            options and risks were discussed with the patient.                            All questions were answered, and informed consent                            was obtained. Prior Anticoagulants: The patient has                            taken no previous anticoagulant or antiplatelet                            agents. ASA  Grade Assessment: IV - A patient with                            severe systemic disease that is a constant threat                            to life. After reviewing the risks and benefits,                            the patient was deemed in satisfactory condition to                            undergo the procedure.                           After obtaining informed consent, the colonoscope                             was passed under direct vision. Throughout the                            procedure, the patient's blood pressure, pulse, and                            oxygen saturations were monitored continuously. The                            CF-HQ190L ([id number]) Olympus adult colon was                            introduced through the anus and advanced to the the                            cecum, identified by appendiceal orifice and                            ileocecal valve. The colonoscopy was performed                            without difficulty. The patient tolerated the                            procedure well. The quality of the bowel                            preparation was good. The ileocecal valve,                            appendiceal orifice, and rectum were photographed. Scope In: 11:10:07 AM Scope Out: 11:26:06 AM Scope Withdrawal Time: 0 hours 10 minutes 23 seconds  Total Procedure Duration: 0 hours 15 minutes 59 seconds  Findings:      Five sessile polyps were found in the descending colon, ascending colon       and cecum. The polyps were  3 to 10 mm in size. These polyps were removed       with a cold snare. Resection and retrieval were complete.      A 13 mm polyp was found in the ascending colon. The polyp was sessile.       The polyp was removed with a hot snare. Resection and retrieval were       complete.      Multiple small-mouthed diverticula were found in the left colon.      The exam was otherwise without abnormality on direct and retroflexion       views. Impression:               - Five 3 to 10 mm polyps in the descending colon,                            in the ascending colon and in the cecum, removed                            with a cold snare. Resected and retrieved.                           - One 13 mm polyp in the ascending colon, removed                            with a hot snare. Resected and retrieved.                            - Diverticulosis in the left colon.                           - The examination was otherwise normal on direct                            and retroflexion views. Recommendation:           - EGD now to continue workup for dark stools, heme                            +, normocytic anemia Procedure Code(s):        --- Professional ---                           [id number], Colonoscopy, flexible; with removal of                            tumor(s), polyp(s), or other lesion(s) by snare                            technique Diagnosis Code(s):        --- Professional ---                           D12.4, Benign neoplasm of descending colon                           D12.2, Benign neoplasm of ascending  colon                           D12.0, Benign neoplasm of cecum                           R19.5, Other fecal abnormalities                           K92.1, Melena (includes Hematochezia)                           K57.30, Diverticulosis of large intestine without                            perforation or abscess without bleeding CPT copyright 2018 [employer]. All rights reserved. The codes documented in this report are preliminary and upon coder review may  be revised to meet current compliance requirements. Milus Banister, MD 02/22/2018 11:31:17 AM This report has been signed electronically. Number of Addenda: 0

## 2018-02-23 ENCOUNTER — Encounter (HOSPITAL_COMMUNITY): Payer: Self-pay | Admitting: Gastroenterology

## 2018-02-23 LAB — CBC WITH DIFFERENTIAL/PLATELET
Abs Immature Granulocytes: 0.04 10*3/uL (ref 0.00–0.07)
BASOS PCT: 0 %
Basophils Absolute: 0 10*3/uL (ref 0.0–0.1)
EOS ABS: 0.6 10*3/uL — AB (ref 0.0–0.5)
Eosinophils Relative: 7 %
HEMATOCRIT: 29.1 % — AB (ref 39.0–52.0)
Hemoglobin: 8.5 g/dL — ABNORMAL LOW (ref 13.0–17.0)
Immature Granulocytes: 1 %
Lymphocytes Relative: 15 %
Lymphs Abs: 1.3 10*3/uL (ref 0.7–4.0)
MCH: 24.8 pg — ABNORMAL LOW (ref 26.0–34.0)
MCHC: 29.2 g/dL — AB (ref 30.0–36.0)
MCV: 84.8 fL (ref 80.0–100.0)
MONO ABS: 1 10*3/uL (ref 0.1–1.0)
Monocytes Relative: 12 %
NEUTROS ABS: 5.6 10*3/uL (ref 1.7–7.7)
NEUTROS PCT: 65 %
PLATELETS: 190 10*3/uL (ref 150–400)
RBC: 3.43 MIL/uL — ABNORMAL LOW (ref 4.22–5.81)
RDW: 15.7 % — AB (ref 11.5–15.5)
WBC: 8.5 10*3/uL (ref 4.0–10.5)
nRBC: 0 % (ref 0.0–0.2)

## 2018-02-23 LAB — URINALYSIS, ROUTINE W REFLEX MICROSCOPIC
BILIRUBIN URINE: NEGATIVE
GLUCOSE, UA: NEGATIVE mg/dL
Hgb urine dipstick: NEGATIVE
KETONES UR: NEGATIVE mg/dL
Leukocytes, UA: NEGATIVE
NITRITE: NEGATIVE
PH: 5 (ref 5.0–8.0)
Protein, ur: NEGATIVE mg/dL
Specific Gravity, Urine: 1.015 (ref 1.005–1.030)

## 2018-02-23 LAB — GLUCOSE, CAPILLARY
GLUCOSE-CAPILLARY: 125 mg/dL — AB (ref 70–99)
Glucose-Capillary: 128 mg/dL — ABNORMAL HIGH (ref 70–99)
Glucose-Capillary: 153 mg/dL — ABNORMAL HIGH (ref 70–99)

## 2018-02-23 LAB — TYPE AND SCREEN
ABO/RH(D): O NEG
ANTIBODY SCREEN: NEGATIVE
Unit division: 0
Unit division: 0
Unit division: 0

## 2018-02-23 LAB — BPAM RBC
BLOOD PRODUCT EXPIRATION DATE: 201911302359
Blood Product Expiration Date: 201912042359
Blood Product Expiration Date: 201912082359
ISSUE DATE / TIME: 201911081815
ISSUE DATE / TIME: 201911052308
UNIT TYPE AND RH: 5100
UNIT TYPE AND RH: 9500
Unit Type and Rh: 9500

## 2018-02-23 LAB — RENAL FUNCTION PANEL
ALBUMIN: 3.4 g/dL — AB (ref 3.5–5.0)
ANION GAP: 12 (ref 5–15)
BUN: 24 mg/dL — ABNORMAL HIGH (ref 8–23)
CALCIUM: 9 mg/dL (ref 8.9–10.3)
CO2: 26 mmol/L (ref 22–32)
Chloride: 98 mmol/L (ref 98–111)
Creatinine, Ser: 2.37 mg/dL — ABNORMAL HIGH (ref 0.61–1.24)
GFR, EST AFRICAN AMERICAN: 29 mL/min — AB (ref >60)
GFR, EST NON AFRICAN AMERICAN: 25 mL/min — AB (ref >60)
Glucose, Bld: 90 mg/dL (ref 70–99)
PHOSPHORUS: 3.2 mg/dL (ref 2.5–4.6)
POTASSIUM: 3.1 mmol/L — AB (ref 3.5–5.1)
SODIUM: 136 mmol/L (ref 135–145)

## 2018-02-23 LAB — SODIUM, URINE, RANDOM: SODIUM UR: 24 mmol/L

## 2018-02-23 LAB — CREATININE, URINE, RANDOM: Creatinine, Urine: 180.99 mg/dL

## 2018-02-23 MED ORDER — MENTHOL 3 MG MT LOZG
1.0000 | LOZENGE | OROMUCOSAL | Status: DC | PRN
  Administered 2018-02-23: 3 mg via ORAL
  Filled 2018-02-23: qty 9

## 2018-02-23 MED ORDER — APIXABAN 2.5 MG PO TABS
2.5000 mg | ORAL_TABLET | Freq: Two times a day (BID) | ORAL | Status: DC
  Administered 2018-02-23 – 2018-02-25 (×5): 2.5 mg via ORAL
  Filled 2018-02-23 (×5): qty 1

## 2018-02-23 MED ORDER — METOPROLOL SUCCINATE ER 25 MG PO TB24
25.0000 mg | ORAL_TABLET | Freq: Every day | ORAL | Status: DC
  Administered 2018-02-23 – 2018-02-24 (×2): 25 mg via ORAL
  Filled 2018-02-23: qty 1

## 2018-02-23 NOTE — Progress Notes (Signed)
Pt c/o sore throat and requesting lozenges or throat spray. Messaged MD.  Eleanora Neighbor, RN

## 2018-02-23 NOTE — Progress Notes (Signed)
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  Admitted from home with bilateral edema abdominal pain and some dark stool-abdominal distention X 2 months, S OB X 2 weeks  In the ED found to have + FOBT hemoglobin 7.4 BNP 670 and INR 0.9 renal function worsening no tachycardia no tachypnea CXR = moderate R fluid effusion  A & Plan Undifferentiated GI bleed-hemoglobin 13 on 08/2017--> 7.4 on admission 11/5 S/p scope as below-no overt cause other than polpys-defer AC to GI-not sure if SB may need capsule vs not Will cautiously resume a lower dose of Ac as per below and d/c asa and observe  Acute blood loss anemia likely from GI bleeding-transfuse 1 U prbc and reassess counts going forward  had 1 U prbc earlier this admit on 11/5  Acute respiratory failure secondary to CHF-last EF 60-65%-given Lasix 1 60 X1 Further diuresis limited by rising creatinine  Pleural effusion on admit-suspect from HF 2/2 afib-CXR as below-no furthe rwork-up as resolving  Fatty liver-ultrasound performed confirms ascites, fatty liver-per GI  Acute renal failure with worsening creatinine-diuresis held Lasix IV 40 held-keep volumes even-creat stabilized-stble D/w renal-get Fena and Ua but can follow as OP-not severe and likely could be progression of renal disease  Mild hypokalemia-resolved  Chronic kidney disease stage III, prior history of carcinoma of kidney status post resection 2011-see above discussion-monitor  HTN-all meds on hold  Atrial fibrillation chads score >3 stopped Xarelto because of easy bruising about 1 year prior-resumed Metoprolol XL at dose 25- use low dose eliquis,  monitor for bleeeding  DM TY 2 uncontrolled last A1c 7.0-sugars 93-101  Morbid obesity BMI 36    DVT prophylaxis: LOvenoex  Code Status: ip   Family Communication: d/w daughter at bedside   Disposition Plan:  inpatient    Verlon Au, MD  [facility] Direct contact: [phone] --Via amion app OR  --[email or web address]; password [email or web address]  7PM-7AM contact night coverage as above 02/23/2018, 10:17 AM  LOS: 4 days   Consultants:  GI  Procedures:  Await echo  Antimicrobials:   n  Interval history/Subjective:  Awake pleasant nad Eating No oxygen req   Objective:  Vitals:  Vitals:   02/22/18 2038 02/23/18 0352  BP: (!) 118/44 140/62  Pulse: 88 94  Resp: 16 18  Temp: 98.3 F (36.8 C) 98 F (36.7 C)  SpO2: 97% 92%    Exam:  . Obese pleasant . Chest is clear no added sound, no rales no rhonchi . Abdomen obese non tender  . Grade 1-2 lower extremity edema has a bleb over the right shin and looks resolved . Neurologically intact . Power 5/5   I have personally reviewed the following:   Labs:  Hemoglobin down from eight 7.8-->7.6  Creatinine up from 30/2.0 on admit to 34/2.2 and stable  Imaging studies: ECHO Pending - The patient appeared to be in atrial fibrillation. Normal LV size   with EF 55-60%. Normal RV size and systolic function. Biatrial   enlargement. Moderate pulmonary hypertension with dilated IVC.    Mild MR.  CXr 2 view 11/9-Right-sided pleural effusion has decreased in size, with residual small bilateral pleural effusions seen. Underlying bibasilar airspace opacities may reflect atelectasis or pneumonia, as previously noted.  Medical tests:  Colonoscopy Five sessile polyps were found in the descending colon, ascending colon       and cecum. The polyps were 3 to 10 mm in size. These polyps were removed       with a cold snare. Resection and retrieval were complete.      A 13 mm polyp was found in the ascending colon. The polyp was  sessile.       The polyp was removed with a hot snare. Resection and retrieval were       complete.      Multiple small-mouthed diverticula were found in the left colon.      The exam was otherwise without abnormality on direct and retroflexion   Endocopy Findings:      The esophagus was normal.      The stomach was normal.      The examined duodenum was normal.  Test discussed with performing physician:  n  Decision to obtain old records:  n  Review and summation of old records:  nn  Scheduled Meds: . sodium chloride   Intravenous Once  . sodium chloride   Intravenous Once  . acetaminophen  650 mg Oral Once  . diphenhydrAMINE  25 mg Oral Once  . furosemide  20 mg Intravenous Once  . insulin aspart  0-5 Units Subcutaneous QHS  . insulin aspart  0-9 Units Subcutaneous TID WC  . multivitamin with minerals  1 tablet Oral Daily  . pantoprazole  40 mg Oral BID  . rosuvastatin  5 mg Oral Daily   Continuous Infusions:   Principal Problem:   GIB (gastrointestinal bleeding) Active Problems:   Acute renal failure superimposed on stage 3 chronic kidney disease (HCC)   Hyperlipidemia   Atrial fibrillation ([place])   DM type 2, uncontrolled, with renal complications (HCC)   Symptomatic anemia   Pleural effusion on right   Acute respiratory failure with hypoxia (HCC)   Acute CHF   Essential hypertension   Polyp of descending colon   LOS: 4 days

## 2018-02-24 LAB — CBC WITH DIFFERENTIAL/PLATELET
Abs Immature Granulocytes: 0.06 10*3/uL (ref 0.00–0.07)
Basophils Absolute: 0 10*3/uL (ref 0.0–0.1)
Basophils Relative: 0 %
EOS ABS: 0.7 10*3/uL — AB (ref 0.0–0.5)
EOS PCT: 7 %
HCT: 29.5 % — ABNORMAL LOW (ref 39.0–52.0)
HEMOGLOBIN: 8.5 g/dL — AB (ref 13.0–17.0)
Immature Granulocytes: 1 %
LYMPHS PCT: 11 %
Lymphs Abs: 1.1 10*3/uL (ref 0.7–4.0)
MCH: 24.1 pg — AB (ref 26.0–34.0)
MCHC: 28.8 g/dL — AB (ref 30.0–36.0)
MCV: 83.8 fL (ref 80.0–100.0)
MONO ABS: 1.3 10*3/uL — AB (ref 0.1–1.0)
Monocytes Relative: 13 %
NRBC: 0 % (ref 0.0–0.2)
Neutro Abs: 6.9 10*3/uL (ref 1.7–7.7)
Neutrophils Relative %: 68 %
Platelets: 197 10*3/uL (ref 150–400)
RBC: 3.52 MIL/uL — AB (ref 4.22–5.81)
RDW: 15.7 % — AB (ref 11.5–15.5)
WBC: 10.1 10*3/uL (ref 4.0–10.5)

## 2018-02-24 LAB — GLUCOSE, CAPILLARY
GLUCOSE-CAPILLARY: 132 mg/dL — AB (ref 70–99)
GLUCOSE-CAPILLARY: 148 mg/dL — AB (ref 70–99)
Glucose-Capillary: 110 mg/dL — ABNORMAL HIGH (ref 70–99)
Glucose-Capillary: 151 mg/dL — ABNORMAL HIGH (ref 70–99)

## 2018-02-24 LAB — RENAL FUNCTION PANEL
ANION GAP: 7 (ref 5–15)
Albumin: 3 g/dL — ABNORMAL LOW (ref 3.5–5.0)
BUN: 24 mg/dL — AB (ref 8–23)
CO2: 30 mmol/L (ref 22–32)
Calcium: 9 mg/dL (ref 8.9–10.3)
Chloride: 97 mmol/L — ABNORMAL LOW (ref 98–111)
Creatinine, Ser: 2.21 mg/dL — ABNORMAL HIGH (ref 0.61–1.24)
GFR calc Af Amer: 32 mL/min — ABNORMAL LOW (ref >60)
GFR calc non Af Amer: 27 mL/min — ABNORMAL LOW (ref >60)
GLUCOSE: 115 mg/dL — AB (ref 70–99)
POTASSIUM: 3.3 mmol/L — AB (ref 3.5–5.1)
Phosphorus: 2.2 mg/dL — ABNORMAL LOW (ref 2.5–4.6)
Sodium: 134 mmol/L — ABNORMAL LOW (ref 135–145)

## 2018-02-24 MED ORDER — METOPROLOL SUCCINATE ER 50 MG PO TB24
50.0000 mg | ORAL_TABLET | Freq: Every day | ORAL | Status: DC
  Administered 2018-02-25: 50 mg via ORAL
  Filled 2018-02-24 (×2): qty 1

## 2018-02-24 MED ORDER — FUROSEMIDE 20 MG PO TABS
20.0000 mg | ORAL_TABLET | ORAL | Status: DC
  Administered 2018-02-25: 20 mg via ORAL
  Filled 2018-02-24: qty 1

## 2018-02-24 MED ORDER — FUROSEMIDE 20 MG PO TABS: 20.0000 mg | ORAL_TABLET | ORAL | Status: DC

## 2018-02-24 MED ORDER — POTASSIUM CHLORIDE CRYS ER 20 MEQ PO TBCR
40.0000 meq | EXTENDED_RELEASE_TABLET | Freq: Every day | ORAL | Status: DC
  Administered 2018-02-24 – 2018-02-25 (×2): 40 meq via ORAL
  Filled 2018-02-24 (×2): qty 2

## 2018-02-24 NOTE — Progress Notes (Signed)
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  Admitted from home with bilateral edema abdominal pain and some dark stool-abdominal distention X 2 months, S OB X 2 weeks  In the ED found to have + FOBT hemoglobin 7.4 BNP 670 and INR 0.9 renal function worsening no tachycardia no tachypnea CXR = moderate R fluid effusion  A & Plan Undifferentiated GI bleed-hemoglobin 13 on 08/2017--> 7.4 on admission 11/5 S/p scope as below-no overt cause other than polpys- Changed to low-dose Eliquis and observe 1 more day and if no bleeding can probably discharge home  Acute blood loss anemia likely from GI bleeding-transfuse 1 U prbc and reassess counts going forward  had 1 U prbc earlier this admit on 11/5-stable at this time  Acute respiratory failure secondary to CHF-last EF 60-65%-given Lasix 1 60 X1 Further diuresis limited by rising creatinine but overall has been stable and euvolemic  Pleural effusion on admit-suspect from HF 2/2 afib-CXR as below-work-up is reassuring and repeat x-rays have been negative  Fatty liver-ultrasound performed confirms ascites, fatty liver-per GI  Acute renal failure with worsening creatinine-diuresis held Lasix IV 40 held-keep volumes even-creat stabilized-stable D/w Dr. Carolin Sicks of renal-Fina suggestive of prerenal, UA not significant for proteinuria Outpatient follow-up needs to be organized  Mild hypokalemia-resolving-we will replace with K. Dur 40 daily  Chronic kidney disease stage III, prior history of carcinoma of kidney status post resection 2011-see above discussion-monitor  HTN-all meds on hold  Atrial fibrillation chads score >3  stopped Xarelto because of easy bruising about 1 year prior-resumed Metoprolol XL 11/9 and titrating the dose to 50 mg for better rate control  DM TY 2 uncontrolled last A1c 7.0-sugars between 110 and 115  Morbid obesity BMI 36    DVT prophylaxis: LOvenoex  Code Status: ip   Family Communication: d/w daughter at bedside   Disposition Plan:  inpatient-likely can discharge in the next 24 to 51 hours   Miklo Aken, MD  [facility] Direct contact: [phone] --Via amion app OR  --[email or web address]; password [email or web address]  7PM-7AM contact night coverage as above 02/24/2018, 10:26 AM  LOS: 5 days   Consultants:  GI  Procedures:  Echocardiogram 11/6 shows normal LVEF 55 to 60% with slightly enlarged heart chambers on the right side suggesting pulmonary hypertension  Antimicrobials:   n  Interval history/Subjective:  Doing fair Not being as much and occasionally has had this in the past No fever no chills No cough No cold Does not feel swollen No chest pain    Objective:  Vitals:  Vitals:   02/23/18 2112 02/24/18 0328  BP: 129/74 (!) 138/54  Pulse: 98 (!) 102  Resp: 18 18  Temp: 98.4 F (36.9 C) 99 F (37.2 C)  SpO2: 94% 92%    Exam:  . Obese pleasant no distress no pallor no icterus . Chest is clear no added sound, no rales no rhonchi . Abdomen obese non tender no rebound no guarding . Grade 1-2 lower extremity edema has a bleb over the right shin and looks resolved . Neurologically intact . Power 5/5   I have personally reviewed the following:   Labs:  Hemoglobin down from eight 7.8-->7.6--8.5  Creatinine up from 30/2.0 on admit-->24/2.2  Imaging studies:  ECHO Pending - The patient appeared to be in atrial fibrillation. Normal LV size   with EF 55-60%. Normal RV size and systolic function. Biatrial   enlargement. Moderate pulmonary hypertension with dilated IVC.    Mild MR.  CXr 2 view 11/9-Right-sided pleural effusion has decreased in size, with  residual small bilateral pleural effusions seen. Underlying bibasilar airspace opacities may reflect atelectasis or pneumonia, as previously noted.  Medical tests:  Colonoscopy Five sessile polyps were found in the descending colon, ascending colon       and cecum. The polyps were 3 to 10 mm in size. These polyps were removed       with a cold snare. Resection and retrieval were complete.      A 13 mm polyp was found in the ascending colon. The polyp was sessile.       The polyp was removed with a hot snare. Resection and retrieval were       complete.      Multiple small-mouthed diverticula were found in the left colon.      The exam was otherwise without abnormality on direct and retroflexion   Endocopy Findings:      The esophagus was normal.      The stomach was normal.      The examined duodenum was normal.  Test discussed with performing physician:  n  Decision to obtain old records:  n  Review and summation of old records:  nn  Scheduled Meds: . sodium chloride   Intravenous Once  . sodium chloride   Intravenous Once  . acetaminophen  650 mg Oral Once  . apixaban  2.5 mg Oral BID  . diphenhydrAMINE  25 mg Oral Once  . furosemide  20 mg Intravenous Once  . insulin aspart  0-5 Units Subcutaneous QHS  . insulin aspart  0-9 Units Subcutaneous TID WC  . metoprolol succinate  25 mg Oral Daily  . multivitamin with minerals  1 tablet Oral Daily  . pantoprazole  40 mg Oral BID  . rosuvastatin  5 mg Oral Daily   Continuous Infusions:   Principal Problem:   GIB (gastrointestinal bleeding) Active Problems:   Acute renal failure superimposed on stage 3 chronic kidney disease (HCC)   Hyperlipidemia   Atrial fibrillation ([place])   DM type 2, uncontrolled, with renal complications (HCC)   Symptomatic anemia   Pleural effusion on right   Acute respiratory failure with hypoxia (HCC)   Acute CHF   Essential hypertension   Polyp of descending colon   LOS: 5 days

## 2018-02-24 NOTE — Discharge Instructions (Signed)

## 2018-02-25 LAB — RENAL FUNCTION PANEL
Albumin: 3.2 g/dL — ABNORMAL LOW (ref 3.5–5.0)
Anion gap: 9 (ref 5–15)
BUN: 24 mg/dL — AB (ref 8–23)
CALCIUM: 9.1 mg/dL (ref 8.9–10.3)
CHLORIDE: 96 mmol/L — AB (ref 98–111)
CO2: 29 mmol/L (ref 22–32)
CREATININE: 2.06 mg/dL — AB (ref 0.61–1.24)
GFR calc Af Amer: 34 mL/min — ABNORMAL LOW (ref >60)
GFR calc non Af Amer: 30 mL/min — ABNORMAL LOW (ref >60)
GLUCOSE: 149 mg/dL — AB (ref 70–99)
Phosphorus: 2 mg/dL — ABNORMAL LOW (ref 2.5–4.6)
Potassium: 3.5 mmol/L (ref 3.5–5.1)
Sodium: 134 mmol/L — ABNORMAL LOW (ref 135–145)

## 2018-02-25 LAB — GLUCOSE, CAPILLARY
GLUCOSE-CAPILLARY: 151 mg/dL — AB (ref 70–99)
Glucose-Capillary: 142 mg/dL — ABNORMAL HIGH (ref 70–99)

## 2018-02-25 MED ORDER — APIXABAN 2.5 MG PO TABS: 2.5000 mg | ORAL_TABLET | Freq: Two times a day (BID) | ORAL | 2 refills | Status: DC

## 2018-02-25 MED ORDER — METOPROLOL SUCCINATE ER 50 MG PO TB24: 50.0000 mg | ORAL_TABLET | Freq: Every day | ORAL | 0 refills | Status: DC

## 2018-02-25 MED ORDER — PANTOPRAZOLE SODIUM 40 MG PO TBEC: 40.0000 mg | DELAYED_RELEASE_TABLET | Freq: Two times a day (BID) | ORAL | 0 refills | Status: DC

## 2018-02-25 MED ORDER — FUROSEMIDE 20 MG PO TABS: 20.0000 mg | ORAL_TABLET | ORAL | 0 refills | Status: DC

## 2018-02-25 NOTE — Progress Notes (Signed)
Change dressing on recently busted blister fluid filled area on bilateral lower shin.Skin tear on the left shin measuring 5 x 3 x .1 cm. 100% red ,weeping with clear fluid slightly due to very swollen/3+non pitting  edema leg and foot.No signs of infection.The right shin has two skin tear from busted fluid filled blister 4 cm x 5 cm x.1cm and on top of it is another skin tear 4cm x 3 cm x.1cm red 100% red flesh looking ,both weeping with clear fluid from the swollen/3+ non pitting edema of leg and foot.No signs of infection. Patient and her daughter were both educated to look for the signs and symptoms of infection.Six pieces of abdominal pads.6 pieces of 4 x 4 gauze,6 pieces of betadine swabs given and normal saline irrigation fluid give cleaning and dressing of skin tear.Nurse teach them how to clean the skin tear and change dressing.Advised them to removed the pink foam dressing if it continoe to weep and placed abdominal pad instead.M.D. Called to and made aware.

## 2018-02-25 NOTE — Care Management Note (Signed)
Case Management Note  Patient Details  Name: Douglas Soto MRN: [id number] Date of Birth: 1940-06-02  Subjective/Objective: Pt admitted with SOB                   Action/Plan:  PTA independent from home, has PCP and prescription coverage.  Pt to discharge home on Eliquis - CM contacted pts pharmacy and was informed that PA is not required however copay will be $118.78 post 30 day free supply.  CM informed pt of copay - pt states he can afford.  CM provided free 30 day card   Expected Discharge Date:  02/25/18               Expected Discharge Plan:  Home/Self Care(independent from home, has PCP and  prescription coverage)  In-House Referral:     Discharge planning Services  CM Consult, Medication Assistance  Post Acute Care Choice:    Choice offered to:     DME Arranged:    DME Agency:     HH Arranged:    [place] Agency:     Status of Service:  Completed, signed off  If discussed at [employer], dates discussed:    Additional Comments:  Maryclare Labrador, RN 02/25/2018, 11:50 AM

## 2018-02-25 NOTE — Discharge Summary (Signed)
Physician Discharge Summary  Douglas Soto CLE:[id number] DOB: [birth year] DOA: 02/19/2018  PCP: Lauree Chandler, NP  Admit date: 02/19/2018 Discharge date: 02/25/2018  Time spent: 30 minutes  Recommendations for Outpatient Follow-up:  1. needs renal panel in about 1 week and adjustment of Lasix from every other day to every day with if needed for short periods of time 2. I have encouraged the family to try and schedule an outpatient appointment with nephrology non-emergently in about 2 weeks 3. Patient will need outpatient CBC and further work-up and follow-up with GI as per their instructions 4. Xarelto was substituted for Eliquis at a lower dose this admission and he will need discussion about this ongoing if he has further bleeding and may not be a candidate if this occurs 5. Note dosage adjustments of metoprolol, Eliquis 6. We did start oxygen this hospital stay because of decompensation and respiratory failure likely secondary to heart failure-patient has poor health literacy and does not seem to understand clearly-daughter who also does not seem to completely understand and may need further repeat discussions repetitively to enforce what is going  Discharge Diagnoses:  Principal Problem:   GIB (gastrointestinal bleeding) Active Problems:   Acute renal failure superimposed on stage 3 chronic kidney disease (HCC)   Hyperlipidemia   Atrial fibrillation ([place])   DM type 2, uncontrolled, with renal complications (HCC)   Symptomatic anemia   Pleural effusion on right   Acute respiratory failure with hypoxia (HCC)   Acute CHF   Essential hypertension   Polyp of descending colon   Discharge Condition: Improved  Diet recommendation: Heart healthy no distress  Filed Weights   02/19/18 1636 02/24/18 2025  Weight: 108.9 kg 107.9 kg    History of present illness:  77 year old male HTN, HLD, DM TY 2, gout, morbid obesity, bipolar, atrial fibrillation chads score >3 not on  anticoagulation secondary to noncompliance and unwillingness to take, 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  Admitted from home with bilateral edema abdominal pain and some dark stool-abdominal distention X 2 months, S OB X 2 weeks  In the ED found to have + FOBT hemoglobin 7.4 BNP 670 and INR 0.9 renal function worsening no tachycardia no tachypnea CXR = moderate R fluid effusion Patient stabilized but had some renal dysfunction in the hospital and was found to have some hypoxic respiratory failure during hospitalization requiring titration of diuretic doses as below  Hospital Course:  Undifferentiated GI bleed-hemoglobin 13 on 08/2017--> 7.4 on admission 11/5 S/p scope as below-no overt cause other than polpys- Changed to low-dose Eliquis and observe 1 more day and if no bleeding can probably discharge home  Acute blood loss anemia likely from GI bleeding-transfuse 1 U prbc and reassess counts going forward  had 1 U prbc earlier this admit on 11/5-stable at this time As a result Eliquis was resumed at a very low dose and he had a normal brown stool and hemoglobins were stable so it was felt that the patient was relatively stabilized ongoing if he has further issues we may need to revisit anticoagulation  Acute respiratory failure secondary to CHF-last EF 60-65%-given Lasix 1 60 X1 Further diuresis limited by rising creatinine but overall has been stable and euvolemic On discharge she will take Lasix 20 q. OD and this may need to be titrated and he will need labs to guide this The patient will also need possible oxygen as he decompensated  Pleural effusion on admit-suspect from HF 2/2 afib-CXR as below-work-up  is reassuring and repeat x-rays have been negative so there is no indication for further management of the same with thoracentesis etc.  Fatty liver-ultrasound performed confirms ascites, fatty liver-per GI  Acute renal failure with worsening creatinine-diuresis  held Lasix IV 40 held-keep volumes even-creat stabilized-stable D/w Dr. Carolin Sicks of renal-Fina suggestive of prerenal, UA not significant for proteinuria Outpatient follow-up needs to be organized-I have alerted the daughter  Mild hypokalemia-resolving-we will replace with K. Dur 40 daily  Chronic kidney disease stage III, prior history of carcinoma of kidney status post resection 2011-see above discussion-monitor  HTN-all meds on hold  Atrial fibrillation chads score >3 stopped Xarelto because of easy bruising about 1 year prior-resumed Metoprolol XL 11/9 and titrating the dose to 50 mg for better rate control  DM TY 2 uncontrolled last A1c 7.0-sugars are ranging between 1 40-1 50 range  Morbid obesity BMI 36  Procedures:  Labs:  Hemoglobin down from eight 7.8-->7.6  Creatinine up from 30/2.0 on admit to 34/2.2 and stable   Imaging studies: ECHO Pending - The patient appeared to be in atrial fibrillation. Normal LV size   with EF 55-60%. Normal RV size and systolic function. Biatrial   enlargement. Moderate pulmonary hypertension with dilated IVC.    Mild MR.  CXr 2 view 11/9-Right-sided pleural effusion has decreased in size, with residual small bilateral pleural effusions seen. Underlying bibasilar airspace opacities may reflect atelectasis or pneumonia, as previously noted.   Medical tests:  Colonoscopy Five sessile polyps were found in the descending colon, ascending colon       and cecum. The polyps were 3 to 10 mm in size. These polyps were removed       with a cold snare. Resection and retrieval were complete.      A 13 mm polyp was found in the ascending colon. The polyp was sessile.       The polyp was removed with a hot snare. Resection and retrieval were       complete.      Multiple small-mouthed diverticula were found in the left colon.      The exam was otherwise without abnormality on direct and retroflexion    Endocopy Findings:      The  esophagus was normal.      The stomach was normal.      The examined duodenum was normal.  Consultations:  Gastroenterology  Telephone consulted nephrology this admission  Discharge Exam: Vitals:   02/25/18 0601 02/25/18 0856  BP: (!) 134/51 129/61  Pulse: 96 89  Resp: 18 18  Temp: 98.9 F (37.2 C) 98.1 F (36.7 C)  SpO2: 90% 94%    General: Awake alert no distress thick neck EOMI NCAT Cardiovascular: S1-S2 no murmur rub or gallop no adventitious sounds Respiratory: Clinically clear without rales or rhonchi Neurologically intact without any deficit Skin is swollen with grade 1 lower extremity edema no shifting dullness Neurologically intact  Discharge Instructions   Discharge Instructions    Diet - low sodium heart healthy   Complete by:  As directed    Discharge instructions   Complete by:  As directed    You will need to drink less than 1500 cc of fluid daily You will notice and adjustment of you metorprolol XL and you should continue this for heart rate control--this dose might need increasing to some degree as an OP We have cautiously prescribed lasix for you every OTHER day as an OP-at some point you will need referral  to a kidney specialist and you will need labs as an op in 1 week I would recommend no furthe rdrinking alcohol as an OP   Increase activity slowly   Complete by:  As directed      Allergies as of 02/25/2018      Reactions   Allopurinol Other (See Comments)   Whelps on body and sores in mouth       Medication List    STOP taking these medications   metoprolol tartrate 50 MG tablet Commonly known as:  LOPRESSOR   Olmesartan-amLODIPine-HCTZ 40-10-25 MG Tabs   rivaroxaban 20 MG Tabs tablet Commonly known as:  XARELTO   ULORIC 40 MG tablet Generic drug:  febuxostat     TAKE these medications   apixaban 2.5 MG Tabs tablet Commonly known as:  ELIQUIS Take 1 tablet (2.5 mg total) by mouth 2 (two) times daily.   doxazosin 8 MG  tablet Commonly known as:  CARDURA TAKE 1 TABLET BY MOUTH ONCE DAILY FOR BLOOD PRESSURE What changed:  See the new instructions.   empagliflozin 25 MG Tabs tablet Commonly known as:  JARDIANCE Take one each morning to control diabetes What changed:    how much to take  how to take this  when to take this  additional instructions   furosemide 20 MG tablet Commonly known as:  LASIX Take 1 tablet (20 mg total) by mouth every other day. Start taking on:  02/27/2018   HYDROcodone-acetaminophen 10-325 MG tablet Commonly known as:  NORCO Take 1 tablet by mouth every 6 (six) hours as needed (pain).   metFORMIN 1000 MG tablet Commonly known as:  GLUCOPHAGE TAKE 1 TABLET BY MOUTH TWICE DAILY WITH MEALS What changed:  when to take this   metoprolol succinate 50 MG 24 hr tablet Commonly known as:  TOPROL-XL Take 1 tablet (50 mg total) by mouth daily. Take with or immediately following a meal. Start taking on:  02/26/2018   multivitamin-iron-minerals-folic acid chewable tablet Chew 1 tablet by mouth daily.   pantoprazole 40 MG tablet Commonly known as:  PROTONIX Take 1 tablet (40 mg total) by mouth 2 (two) times daily.   rosuvastatin 5 MG tablet Commonly known as:  CRESTOR TAKE 1 TABLET BY MOUTH ONCE DAILY            Durable Medical Equipment  (From admission, onward)         Start     Ordered   02/25/18 1049  DME Oxygen  Once    Question Answer Comment  Mode or (Route) Nasal cannula   Liters per Minute 3   Frequency Continuous (stationary and portable oxygen unit needed)   Oxygen conserving device No   Oxygen delivery system Gas      02/25/18 1051         Allergies  Allergen Reactions  . Allopurinol Other (See Comments)    Whelps on body and sores in mouth    Follow-up Information    Rosita Fire, MD. Call in 2 week(s).   Specialties:  Nephrology, Internal Medicine Why:  to arrange follow up for kidney issues and discussion about ongoign  care Contact information: [place] [address] [phone]            The results of significant diagnostics from this hospitalization (including imaging, microbiology, ancillary and laboratory) are listed below for reference.    Significant Diagnostic Studies: Dg Chest 2 View  Result Date: 02/23/2018 CLINICAL DATA:  Assess right-sided  pleural effusion. EXAM: CHEST - 2 VIEW COMPARISON:  Chest radiograph performed 02/19/2018 FINDINGS: The right-sided pleural effusion has decreased in size, with residual small bilateral pleural effusions seen. Underlying bibasilar airspace opacities may reflect atelectasis or pneumonia, as previously noted. No pneumothorax is seen. The cardiomediastinal silhouette is normal in size. No acute osseous abnormalities are identified. IMPRESSION: Right-sided pleural effusion has decreased in size, with residual small bilateral pleural effusions seen. Underlying bibasilar airspace opacities may reflect atelectasis or pneumonia, as previously noted. Electronically Signed   By: Garald Balding M.D.   On: 02/23/2018 00:09   US Abdomen Limited  Result Date: 02/20/2018 CLINICAL DATA:  Abdominal swelling. EXAM: ULTRASOUND ABDOMEN LIMITED RIGHT UPPER QUADRANT COMPARISON:  None. FINDINGS: Gallbladder: There are multiple stones in the gallbladder. There is gallbladder wall thickening. There is a small amount of pericholecystic fluid. Negative sonographic Murphy's sign. Common bile duct: Diameter: 3.1 mm, normal. Liver: No focal lesion identified. Diffuse slight increased echogenicity of the liver parenchyma. Prominent right pleural effusion. Small amount of ascites in the right upper quadrant. Portal vein is patent on color Doppler imaging with normal direction of blood flow towards the liver. IMPRESSION: 1. Small amount of ascites. 2. Prominent right pleural effusion. 3. Cholelithiasis with thickening of the gallbladder wall with a negative sonographic Murphy's sign.  4. Increased echogenicity of the liver parenchyma consistent with hepatic steatosis. Electronically Signed   By: Lorriane Shire M.D.   On: 02/20/2018 10:40   Dg Chest Port 1 View  Result Date: 02/19/2018 CLINICAL DATA:  Short of breath EXAM: PORTABLE CHEST 1 VIEW COMPARISON:  09/29/2009 FINDINGS: Moderate right pleural effusion. Dense airspace disease at the right middle lobe and right base. Cardiomediastinal silhouette is largely obscured. There is vascular congestion. No pneumothorax. IMPRESSION: Moderate right pleural effusion with dense atelectasis or pneumonia at the right middle lobe and right base. Vascular congestion. Electronically Signed   By: Donavan Foil M.D.   On: 02/19/2018 17:10    Microbiology: No results found for this or any previous visit (from the past 240 hour(s)).   Labs: Basic Metabolic Panel: Recent Labs  Lab 02/21/18 0535 02/22/18 0540 02/23/18 0452 02/24/18 0542 02/25/18 0630  NA 138 139 136 134* 134*  K 3.2* 3.9 3.1* 3.3* 3.5  CL 98 98 98 97* 96*  CO2 35* 32 26 30 29   GLUCOSE 104* 101* 90 115* 149*  BUN 29* 24* 24* 24* 24*  CREATININE 2.24* 2.25* 2.37* 2.21* 2.06*  CALCIUM 9.5 9.7 9.0 9.0 9.1  PHOS  --   --  3.2 2.2* 2.0*   Liver Function Tests: Recent Labs  Lab 02/19/18 2257 02/23/18 0452 02/24/18 0542 02/25/18 0630  AST 14*  --   --   --   ALT 14  --   --   --   ALKPHOS 55  --   --   --   BILITOT 1.2  --   --   --   PROT 5.6*  --   --   --   ALBUMIN 3.1* 3.4* 3.0* 3.2*   No results for input(s): LIPASE, AMYLASE in the last 168 hours. No results for input(s): AMMONIA in the last 168 hours. CBC: Recent Labs  Lab 02/19/18 1709 02/19/18 2257 02/20/18 0602  02/21/18 0535 02/22/18 0540 02/22/18 2248 02/23/18 0452 02/24/18 0542  WBC 8.4 8.7 8.4  --  8.3  --   --  8.5 10.1  NEUTROABS 6.7  --   --   --   --   --   --  5.6 6.9  HGB 7.4* 7.2* 8.0*   < > 7.8* 7.6* 8.8* 8.5* 8.5*  HCT 25.8* 24.3* 28.8*   < > 27.2* 26.9* 29.9* 29.1* 29.5*   MCV 83.8 81.5 83.7  --  83.4  --   --  84.8 83.8  PLT 199 206 218  --  207  --   --  190 197   < > = values in this interval not displayed.   Cardiac Enzymes: No results for input(s): CKTOTAL, CKMB, CKMBINDEX, TROPONINI in the last 168 hours. BNP: BNP (last 3 results) Recent Labs    02/19/18 1710  BNP 670.0*    ProBNP (last 3 results) No results for input(s): PROBNP in the last 8760 hours.  CBG: Recent Labs  Lab 02/24/18 0951 02/24/18 1300 02/24/18 1703 02/24/18 2252 02/25/18 0737  GLUCAP 110* 151* 132* 148* 142*       Signed:  Nita Sells MD   [facility] 02/25/2018, 10:55 AM

## 2018-02-25 NOTE — Progress Notes (Signed)
SATURATION QUALIFICATIONS: (This note is used to comply with regulatory documentation for home oxygen)  Patient Saturations on Room Air at Rest = 93%  Patient Saturations on Room Air while Ambulating  89%  Patient Saturations on N/A Liters of oxygen while Ambulating = N/A%  Please briefly explain why patient needs home oxygen:

## 2018-02-27 NOTE — Consult Note (Signed)
            [facility] CM Primary Care Navigator  02/27/2018  WENTWORTH EDELEN 04-08-1941 [id number]    Attempt to seepatient at the bedside to identify possible discharge needs buthe was already dischargedhome.  Per MD note,patient presented with dark stool, shortness of breath, bilateral leg edema and abdominal edema. (GIB- gastrointestinal bleeding, acute blood loss anemia, acute respiratory failure secondary to congestive HF, right pleural effusion, DM)  Patient has discharge instruction to follow-up with nephrology, internal medicine in 2 weeks.  Primary care provider's office is listed as providing transition of care (TOC) follow-up.  Primary care provider's officewascalled Rodena Piety) to notifyofpatient'sdischarge and need for post hospital follow-up and transition of care (TOC); to notify of patient's health issues needing close follow-up(mainlyHF,DM, poor health literacy).  Made aware to refer patient to [facility] care management if deemed necessary and appropriate foranyservices.    For additional questions please contact:  Edwena Felty A. Zamiyah Resendes, BSN, RN-BC [facility] PRIMARY CARE Navigator Cell: [phone]

## 2018-03-01 LAB — GLUCOSE, CAPILLARY: GLUCOSE-CAPILLARY: 116 mg/dL — AB (ref 70–99)

## 2018-03-04 ENCOUNTER — Ambulatory Visit: Payer: Medicare Other | Admitting: Gastroenterology

## 2018-03-11 ENCOUNTER — Other Ambulatory Visit: Payer: Self-pay | Admitting: Nurse Practitioner

## 2018-03-18 ENCOUNTER — Other Ambulatory Visit: Payer: Self-pay | Admitting: *Deleted

## 2018-03-18 DIAGNOSIS — M545 Low back pain: Principal | ICD-10-CM

## 2018-03-18 DIAGNOSIS — G8929 Other chronic pain: Secondary | ICD-10-CM

## 2018-03-18 MED ORDER — HYDROCODONE-ACETAMINOPHEN 10-325 MG PO TABS: 1.0000 | ORAL_TABLET | Freq: Four times a day (QID) | ORAL | 0 refills | Status: DC | PRN

## 2018-03-18 NOTE — Telephone Encounter (Signed)
Patient requested Refill [place] Verified LR: 02/13/2018 Pended Rx and sent to [facility] for approval.

## 2018-03-26 ENCOUNTER — Ambulatory Visit: Payer: Medicare Other | Admitting: Nurse Practitioner

## 2018-04-01 ENCOUNTER — Ambulatory Visit (INDEPENDENT_AMBULATORY_CARE_PROVIDER_SITE_OTHER): Payer: Medicare Other | Admitting: Nurse Practitioner

## 2018-04-01 ENCOUNTER — Encounter: Payer: Self-pay | Admitting: Nurse Practitioner

## 2018-04-01 VITALS — BP 164/70 | HR 97 | Temp 97.8°F | Ht 68.0 in | Wt 209.0 lb

## 2018-04-01 DIAGNOSIS — I480 Paroxysmal atrial fibrillation: Secondary | ICD-10-CM

## 2018-04-01 DIAGNOSIS — W548XXA Other contact with dog, initial encounter: Secondary | ICD-10-CM

## 2018-04-01 DIAGNOSIS — I10 Essential (primary) hypertension: Secondary | ICD-10-CM

## 2018-04-01 DIAGNOSIS — E1169 Type 2 diabetes mellitus with other specified complication: Secondary | ICD-10-CM

## 2018-04-01 DIAGNOSIS — M25551 Pain in right hip: Secondary | ICD-10-CM

## 2018-04-01 DIAGNOSIS — Z23 Encounter for immunization: Secondary | ICD-10-CM

## 2018-04-01 DIAGNOSIS — M25552 Pain in left hip: Secondary | ICD-10-CM

## 2018-04-01 DIAGNOSIS — I509 Heart failure, unspecified: Secondary | ICD-10-CM

## 2018-04-01 DIAGNOSIS — E1159 Type 2 diabetes mellitus with other circulatory complications: Secondary | ICD-10-CM

## 2018-04-01 DIAGNOSIS — D649 Anemia, unspecified: Secondary | ICD-10-CM

## 2018-04-01 DIAGNOSIS — N183 Chronic kidney disease, stage 3 unspecified: Secondary | ICD-10-CM

## 2018-04-01 DIAGNOSIS — K2901 Acute gastritis with bleeding: Secondary | ICD-10-CM

## 2018-04-01 MED ORDER — METOPROLOL SUCCINATE ER 50 MG PO TB24: 50.0000 mg | ORAL_TABLET | Freq: Every day | ORAL | 1 refills | Status: DC

## 2018-04-01 NOTE — Patient Instructions (Addendum)
Restart metoprolol Continue to take blood pressure at home, notify of blood pressure readings protonix taken off medication list   DASH Eating Plan DASH stands for "Dietary Approaches to Stop Hypertension." The DASH eating plan is a healthy eating plan that has been shown to reduce high blood pressure (hypertension). It may also reduce your risk for type 2 diabetes, heart disease, and stroke. The DASH eating plan may also help with weight loss. What are tips for following this plan? General guidelines  Avoid eating more than 2,300 mg (milligrams) of salt (sodium) a day. If you have hypertension, you may need to reduce your sodium intake to 1,500 mg a day.  Limit alcohol intake to no more than 1 drink a day for nonpregnant women and 2 drinks a day for men. One drink equals 12 oz of beer, 5 oz of wine, or 1 oz of hard liquor.  Work with your health care provider to maintain a healthy body weight or to lose weight. Ask what an ideal weight is for you.  Get at least 30 minutes of exercise that causes your heart to beat faster (aerobic exercise) most days of the week. Activities may include walking, swimming, or biking.  Work with your health care provider or diet and nutrition specialist (dietitian) to adjust your eating plan to your individual calorie needs. Reading food labels  Check food labels for the amount of sodium per serving. Choose foods with less than 5 percent of the Daily Value of sodium. Generally, foods with less than 300 mg of sodium per serving fit into this eating plan.  To find whole grains, look for the word "whole" as the first word in the ingredient list. Shopping  Buy products labeled as "low-sodium" or "no salt added."  Buy fresh foods. Avoid canned foods and premade or frozen meals. Cooking  Avoid adding salt when cooking. Use salt-free seasonings or herbs instead of table salt or sea salt. Check with your health care provider or pharmacist before using salt  substitutes.  Do not fry foods. Cook foods using healthy methods such as baking, boiling, grilling, and broiling instead.  Cook with heart-healthy oils, such as olive, canola, soybean, or sunflower oil. Meal planning   Eat a balanced diet that includes: ? 5 or more servings of fruits and vegetables each day. At each meal, try to fill half of your plate with fruits and vegetables. ? Up to 6-8 servings of whole grains each day. ? Less than 6 oz of lean meat, poultry, or fish each day. A 3-oz serving of meat is about the same size as a deck of cards. One egg equals 1 oz. ? 2 servings of low-fat dairy each day. ? A serving of nuts, seeds, or beans 5 times each week. ? Heart-healthy fats. Healthy fats called Omega-3 fatty acids are found in foods such as flaxseeds and coldwater fish, like sardines, salmon, and mackerel.  Limit how much you eat of the following: ? Canned or prepackaged foods. ? Food that is high in trans fat, such as fried foods. ? Food that is high in saturated fat, such as fatty meat. ? Sweets, desserts, sugary drinks, and other foods with added sugar. ? Full-fat dairy products.  Do not salt foods before eating.  Try to eat at least 2 vegetarian meals each week.  Eat more home-cooked food and less restaurant, buffet, and fast food.  When eating at a restaurant, ask that your food be prepared with less salt or no salt,  if possible. What foods are recommended? The items listed may not be a complete list. Talk with your dietitian about what dietary choices are best for you. Grains Whole-grain or whole-wheat bread. Whole-grain or whole-wheat pasta. Brown rice. Modena Morrow. Bulgur. Whole-grain and low-sodium cereals. Pita bread. Low-fat, low-sodium crackers. Whole-wheat flour tortillas. Vegetables Fresh or frozen vegetables (raw, steamed, roasted, or grilled). Low-sodium or reduced-sodium tomato and vegetable juice. Low-sodium or reduced-sodium tomato sauce and tomato  paste. Low-sodium or reduced-sodium canned vegetables. Fruits All fresh, dried, or frozen fruit. Canned fruit in natural juice (without added sugar). Meat and other protein foods Skinless chicken or [place]. Ground chicken or [place]. Pork with fat trimmed off. Fish and seafood. Egg whites. Dried beans, peas, or lentils. Unsalted nuts, nut butters, and seeds. Unsalted canned beans. Lean cuts of beef with fat trimmed off. Low-sodium, lean deli meat. Dairy Low-fat (1%) or fat-free (skim) milk. Fat-free, low-fat, or reduced-fat cheeses. Nonfat, low-sodium ricotta or cottage cheese. Low-fat or nonfat yogurt. Low-fat, low-sodium cheese. Fats and oils Soft margarine without trans fats. Vegetable oil. Low-fat, reduced-fat, or light mayonnaise and salad dressings (reduced-sodium). Canola, safflower, olive, soybean, and sunflower oils. Avocado. Seasoning and other foods Herbs. Spices. Seasoning mixes without salt. Unsalted popcorn and pretzels. Fat-free sweets. What foods are not recommended? The items listed may not be a complete list. Talk with your dietitian about what dietary choices are best for you. Grains Baked goods made with fat, such as croissants, muffins, or some breads. Dry pasta or rice meal packs. Vegetables Creamed or fried vegetables. Vegetables in a cheese sauce. Regular canned vegetables (not low-sodium or reduced-sodium). Regular canned tomato sauce and paste (not low-sodium or reduced-sodium). Regular tomato and vegetable juice (not low-sodium or reduced-sodium). Angie Fava. Olives. Fruits Canned fruit in a light or heavy syrup. Fried fruit. Fruit in cream or butter sauce. Meat and other protein foods Fatty cuts of meat. Ribs. Fried meat. Berniece Salines. Sausage. Bologna and other processed lunch meats. Salami. Fatback. Hotdogs. Bratwurst. Salted nuts and seeds. Canned beans with added salt. Canned or smoked fish. Whole eggs or egg yolks. Chicken or [place] with skin. Dairy Whole or 2% milk,  cream, and half-and-half. Whole or full-fat cream cheese. Whole-fat or sweetened yogurt. Full-fat cheese. Nondairy creamers. Whipped toppings. Processed cheese and cheese spreads. Fats and oils Butter. Stick margarine. Lard. Shortening. Ghee. Bacon fat. Tropical oils, such as coconut, palm kernel, or palm oil. Seasoning and other foods Salted popcorn and pretzels. Onion salt, garlic salt, seasoned salt, table salt, and sea salt. Worcestershire sauce. Tartar sauce. Barbecue sauce. Teriyaki sauce. Soy sauce, including reduced-sodium. Steak sauce. Canned and packaged gravies. Fish sauce. Oyster sauce. Cocktail sauce. Horseradish that you find on the shelf. Ketchup. Mustard. Meat flavorings and tenderizers. Bouillon cubes. Hot sauce and Tabasco sauce. Premade or packaged marinades. Premade or packaged taco seasonings. Relishes. Regular salad dressings. Where to find more information:  National Heart, Lung, and [place]: https://wilson-eaton.com/  American Heart Association: www.heart.org Summary  The DASH eating plan is a healthy eating plan that has been shown to reduce high blood pressure (hypertension). It may also reduce your risk for type 2 diabetes, heart disease, and stroke.  With the DASH eating plan, you should limit salt (sodium) intake to 2,300 mg a day. If you have hypertension, you may need to reduce your sodium intake to 1,500 mg a day.  When on the DASH eating plan, aim to eat more fresh fruits and vegetables, whole grains, lean proteins, low-fat dairy, and heart-healthy fats.  Work with your health care provider or diet and nutrition specialist (dietitian) to adjust your eating plan to your individual calorie needs. This information is not intended to replace advice given to you by your health care provider. Make sure you discuss any questions you have with your health care provider. Document Released: 03/23/2011 Document Revised: 03/27/2016 Document Reviewed: 03/27/2016 Elsevier  Interactive Patient Education  [employer].

## 2018-04-01 NOTE — Progress Notes (Signed)
Careteam: Patient Care Team: Lauree Chandler, NP as PCP - General (Geriatric Medicine)  Advanced Directive information    Allergies  Allergen Reactions  . Allopurinol Other (See Comments)    Whelps on body and sores in mouth     Chief Complaint  Patient presents with  . Medical Management of Chronic Issues    3 month follow-up   . Medication Management    Patient concerned that one of his medications was causing diarrhea, patient stopped Pantoprazole and Metoprolol for he did not know which one was causing the symptoms. Diarrhea stopped since d/c'ing medication  . Medication Management    Discuss decreasing dose of metformin   . Immunizations    Flu vaccine today   . Observation    Dog scratch on right lower leg, may need TDaP (refused)      HPI: Patient is a 77 y.o. male seen in the office today for routine followup. He was recently hospitalized due to Acute GI bleed and acute CHF. Reports he is feeling much better at this time.  Did not come in for hospital follow up-states he was too weak to come to an appt.   CHF- was discharged home from hospital with metoprolol succinate which he took for a little while but then stopped after having a bout of diarrhea. Also started on lasix 20 mg daily and has been compliant with this.  No shortness of breath, chest pains or swelling noted at this time.   GI bleed- no overt cause of bleeding noted- was started on protonix but pt reports it caused diarrhea and he will not restart- he has been monitoring his stools and they are brown without signs of bleeding.  DM- not checking blood sugars at home. Taking jardiance 25 mg daily and metformin 1000 mg by mouth twice daily. Has attempted to lose weight with dietary changes. Has lost weight- went from 240 lbs to 209 lbs. Intentional weight loss.  Reports he has always lied about his drinking but since hospitalization has quit drinking completely.   Hyperlipidemia- taking crestor 5  mg by mouth daily   Chronic back/hip pain- some improvement with weight loss, continues on hydrocodone/apap routinely.    Review of Systems:  Review of Systems  Constitutional: Negative for chills, fever and weight loss.  HENT: Negative for tinnitus.   Respiratory: Negative for cough, sputum production and shortness of breath.   Cardiovascular: Negative for chest pain, palpitations and leg swelling.  Gastrointestinal: Negative for abdominal pain, constipation, diarrhea and heartburn.  Genitourinary: Negative for dysuria, frequency and urgency.  Musculoskeletal: Positive for back pain and joint pain. Negative for falls and myalgias.  Skin: Negative.   Neurological: Negative for dizziness and headaches.  Psychiatric/Behavioral: Negative for depression and memory loss. The patient does not have insomnia.     Past Medical History:  Diagnosis Date  . Cholelithiasis 35/3299   uncomplicated.    . Chronic kidney disease, stage II (mild)   . Depression   . Diabetes mellitus without complication ([place])   . Essential hypertension, malignant   . Fatty liver 02/2018   on ultrasound  . Gout, unspecified   . Hyperlipidemia   . Lumbago   . Obesity, unspecified   . Pain in joint, pelvic region and thigh   . Pleural effusion on right 02/2018   Past Surgical History:  Procedure Laterality Date  . APPENDECTOMY    . COLONOSCOPY WITH PROPOFOL N/A 02/22/2018   Procedure: COLONOSCOPY WITH PROPOFOL;  Surgeon: Milus Banister, MD;  Location: [facility] ENDOSCOPY;  Service: Endoscopy;  Laterality: N/A;  . CYST EXCISION  1998   back  . ESOPHAGOGASTRODUODENOSCOPY (EGD) WITH PROPOFOL N/A 02/22/2018   Procedure: ESOPHAGOGASTRODUODENOSCOPY (EGD) WITH PROPOFOL;  Surgeon: Milus Banister, MD;  Location: [facility] ENDOSCOPY;  Service: Endoscopy;  Laterality: N/A;  . POLYPECTOMY  02/22/2018   Procedure: POLYPECTOMY;  Surgeon: Milus Banister, MD;  Location: [facility] ENDOSCOPY;  Service: Endoscopy;;  . 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 His smokeless tobacco use includes chew. He reports previous alcohol use of about 5.0 standard drinks of alcohol per week. He reports that he does not use drugs.  Family History  Problem Relation Age of Onset  . Heart Problems Father   . Heart disease Brother   . Diabetes Brother   . Diabetes Brother   . Heart Problems Mother   . Pneumonia Maternal Grandfather     Medications: Patient's Medications  New Prescriptions   No medications on file  Previous Medications   APIXABAN (ELIQUIS) 2.5 MG TABS TABLET    Take 1 tablet (2.5 mg total) by mouth 2 (two) times daily.   DOXAZOSIN (CARDURA) 8 MG TABLET    TAKE 1 TABLET BY MOUTH ONCE DAILY FOR BLOOD PRESSURE   EMPAGLIFLOZIN (JARDIANCE) 25 MG TABS TABLET    Take one each morning to control diabetes   FUROSEMIDE (LASIX) 20 MG TABLET    Take 1 tablet (20 mg total) by mouth every other day.   HYDROCODONE-ACETAMINOPHEN (NORCO) 10-325 MG TABLET    Take 1 tablet by mouth every 6 (six) hours as needed (pain).   METFORMIN (GLUCOPHAGE) 1000 MG TABLET    TAKE 1 TABLET BY MOUTH TWICE DAILY WITH MEALS   METOPROLOL SUCCINATE (TOPROL-XL) 50 MG 24 HR TABLET    Take 1 tablet (50 mg total) by mouth daily. Take with or immediately following a meal.   MULTIVITAMIN-IRON-MINERALS-FOLIC ACID (CENTRUM) CHEWABLE TABLET    Chew 1 tablet by mouth daily.   PANTOPRAZOLE (PROTONIX) 40 MG TABLET    Take 1 tablet (40 mg total) by mouth 2 (two) times daily.   ROSUVASTATIN (CRESTOR) 5 MG TABLET    TAKE 1 TABLET BY MOUTH ONCE DAILY  Modified Medications   No medications on file  Discontinued Medications   No medications on file     Physical Exam:  Vitals:   04/01/18 1519  BP: (!) 164/70  Pulse: 97  Temp: 97.8 F (36.6 C)  TempSrc: Oral  SpO2: 95%  Weight: 209 lb (94.8 kg)  Height: 5\' 8"  (1.727 m)   Body mass index is 31.78 kg/m.  Physical  Exam Constitutional:      General: He is not in acute distress.    Appearance: He is well-developed.     Comments: Overweight but has lost significant weight  HENT:     Head: Normocephalic and atraumatic.     Nose: Nose normal.     Mouth/Throat:     Pharynx: No oropharyngeal exudate.  Eyes:     Conjunctiva/sclera: Conjunctivae normal.     Pupils: Pupils are equal, round, and reactive to light.  Neck:     Musculoskeletal: Normal range of motion and neck supple.  Cardiovascular:     Rate and Rhythm: Normal rate. Rhythm irregularly irregular.     Heart sounds: Normal heart sounds. No murmur. No friction rub. No gallop.  Pulmonary:     Effort: Pulmonary effort is normal.     Breath sounds: Normal breath sounds.  Abdominal:     General: Bowel sounds are normal. There is no distension.     Palpations: Abdomen is soft.     Tenderness: There is no abdominal tenderness.  Musculoskeletal: Normal range of motion.        General: No tenderness.  Skin:    General: Skin is warm and dry.  Neurological:     Mental Status: He is alert and oriented to person, place, and time.     Coordination: Coordination normal.     Deep Tendon Reflexes: Reflexes are normal and symmetric.  Psychiatric:        Behavior: Behavior normal.        Thought Content: Thought content normal.        Judgment: Judgment normal.     Labs reviewed: Basic Metabolic Panel: Recent Labs    02/23/18 0452 02/24/18 0542 02/25/18 0630  NA 136 134* 134*  K 3.1* 3.3* 3.5  CL 98 97* 96*  CO2 26 30 29   GLUCOSE 90 115* 149*  BUN 24* 24* 24*  CREATININE 2.37* 2.21* 2.06*  CALCIUM 9.0 9.0 9.1  PHOS 3.2 2.2* 2.0*   Liver Function Tests: Recent Labs    09/11/17 1346 12/24/17 1335  02/19/18 2257 02/23/18 0452 02/24/18 0542 02/25/18 0630  AST 13 12  --  14*  --   --   --   ALT 11 12  --  14  --   --   --   ALKPHOS  --   --   --  55  --   --   --   BILITOT 1.3* 0.7  --  1.2  --   --   --   PROT 6.4 5.8*  --   5.6*  --   --   --   ALBUMIN  --   --    < > 3.1* 3.4* 3.0* 3.2*   < > = values in this interval not displayed.   No results for input(s): LIPASE, AMYLASE in the last 8760 hours. No results for input(s): AMMONIA in the last 8760 hours. CBC: Recent Labs    02/19/18 1709  02/21/18 0535  02/22/18 2248 02/23/18 0452 02/24/18 0542  WBC 8.4   < > 8.3  --   --  8.5 10.1  NEUTROABS 6.7  --   --   --   --  5.6 6.9  HGB 7.4*   < > 7.8*   < > 8.8* 8.5* 8.5*  HCT 25.8*   < > 27.2*   < > 29.9* 29.1* 29.5*  MCV 83.8   < > 83.4  --   --  84.8 83.8  PLT 199   < > 207  --   --  190 197   < > = values in this interval not displayed.   Lipid Panel: Recent Labs    05/21/17 1400 09/11/17 1346 12/24/17 1335  CHOL 180 147 135  HDL 44 50 35*  LDLCALC 114* 78 81  TRIG 114 106 95  CHOLHDL 4.1 2.9 3.9   TSH: No results for input(s): TSH in the last 8760 hours. A1C: Lab Results  Component Value Date   HGBA1C 7.0 (H) 12/24/2017     Assessment/Plan 1. Dog scratch -no signs of infection noted. To keep clean and notify if drainage, redness or swelling occurs.  - Td : Tetanus/diphtheria >7yo Preservative  free  2. PAF (paroxysmal atrial fibrillation) (HCC) -rate controlled, stop metopolol however educated on need for him to take medication for rate, blood pressure and CHF.  - COMPLETE METABOLIC PANEL WITH GFR - CBC with Differential/Platelets - metoprolol succinate (TOPROL-XL) 50 MG 24 hr tablet; Take 1 tablet (50 mg total) by mouth daily. Take with or immediately following a meal.  Dispense: 90 tablet; Refill: 1  3. CKD (chronic kidney disease) stage 3, GFR 30-59 ml/min (HCC) -will follow up lab. Encourage proper hydration and to avoid NSAIDS (Aleve, Advil, Motrin, Ibuprofen)  - COMPLETE METABOLIC PANEL WITH GFR  4. Type 2 diabetes mellitus with other specified complication, without long-term current use of insulin (HCC) Has lost weight and modified diet. Has continued on jardiance and  metformin   - Hemoglobin A1c  5. Hypertension associated with diabetes ([place]) Elevated since he has stopped toprol, stressed importance of medication and also discuss that this was most likely not causing diarrhea. He is agreeable to restart- pt does not wish to come back to office for recheck but will call us and let us know what his bp is at home has significant white coat syndrome and anxiety about being in office. Continue dietary modifications.  - metoprolol succinate (TOPROL-XL) 50 MG 24 hr tablet; Take 1 tablet (50 mg total) by mouth daily. Take with or immediately following a meal.  Dispense: 90 tablet; Refill: 1  6. Pain of both hip joints Has improved with some weight loss but still ongoing, using hydrocodone/apap PRN  7. Congestive heart failure, unspecified HF chronicity, unspecified heart failure type ([place]) euvolemic today, to resume toprol XL  - metoprolol succinate (TOPROL-XL) 50 MG 24 hr tablet; Take 1 tablet (50 mg total) by mouth daily. Take with or immediately following a meal.  Dispense: 90 tablet; Refill: 1  8. Anemia Will follow up CBC today  9. GIB Pt has stopped protonix due to diarrhea and reports he will not restart medication. Educated to need due to GIB and gut protection but he resistant to restarting medication and reports he is not having issues. Has been monitoring stool and not having abnormal pain.  Next appt: 6 months, sooner if needed based on labs and home bp. Carlos American. [place], [facility] [phone]

## 2018-04-02 LAB — BASIC METABOLIC PANEL
BUN: 21 (ref 4–21)
Creatinine: 1.7 — AB (ref 0.6–1.3)
Glucose: 117
Potassium: 4.4 (ref 3.4–5.3)
Sodium: 140 (ref 137–147)

## 2018-04-02 LAB — CBC AND DIFFERENTIAL
HEMATOCRIT: 36 — AB (ref 41–53)
HEMOGLOBIN: 10.8 — AB (ref 13.5–17.5)
Platelets: 354 (ref 150–399)
WBC: 9.2

## 2018-04-02 LAB — HEPATIC FUNCTION PANEL
ALT: 12 (ref 10–40)
AST: 12 — AB (ref 14–40)
Alkaline Phosphatase: 83 (ref 25–125)
BILIRUBIN, TOTAL: 0.5

## 2018-04-02 LAB — HEMOGLOBIN A1C: Hemoglobin A1C: 6.4

## 2018-04-04 LAB — COMPLETE METABOLIC PANEL WITH GFR
AG Ratio: 1.5 (calc) (ref 1.0–2.5)
ALT: 12 U/L (ref 9–46)
AST: 12 U/L (ref 10–35)
Albumin: 4 g/dL (ref 3.6–5.1)
Alkaline phosphatase (APISO): 83 U/L (ref 40–115)
BUN/Creatinine Ratio: 13 (calc) (ref 6–22)
BUN: 21 mg/dL (ref 7–25)
CO2: 30 mmol/L (ref 20–32)
Calcium: 10.2 mg/dL (ref 8.6–10.3)
Chloride: 101 mmol/L (ref 98–110)
Creat: 1.68 mg/dL — ABNORMAL HIGH (ref 0.70–1.18)
GFR, Est African American: 45 mL/min/{1.73_m2} — ABNORMAL LOW (ref >=60)
GFR, Est Non African American: 39 mL/min/{1.73_m2} — ABNORMAL LOW (ref >=60)
Globulin: 2.7 g/dL (calc) (ref 1.9–3.7)
Glucose, Bld: 117 mg/dL (ref 65–139)
Potassium: 4.4 mmol/L (ref 3.5–5.3)
Sodium: 140 mmol/L (ref 135–146)
Total Bilirubin: 0.5 mg/dL (ref 0.2–1.2)
Total Protein: 6.7 g/dL (ref 6.1–8.1)

## 2018-04-04 LAB — CBC WITH DIFFERENTIAL/PLATELET
Absolute Monocytes: 727 cells/uL (ref 200–950)
Basophils Absolute: 37 cells/uL (ref 0–200)
Basophils Relative: 0.4 %
Eosinophils Absolute: 442 cells/uL (ref 15–500)
Eosinophils Relative: 4.8 %
HCT: 36 % — ABNORMAL LOW (ref 38.5–50.0)
HEMOGLOBIN: 10.8 g/dL — AB (ref 13.2–17.1)
Lymphs Abs: 1242 cells/uL (ref 850–3900)
MCH: 22.8 pg — ABNORMAL LOW (ref 27.0–33.0)
MCHC: 30 g/dL — ABNORMAL LOW (ref 32.0–36.0)
MCV: 75.9 fL — ABNORMAL LOW (ref 80.0–100.0)
MPV: 9.5 fL (ref 7.5–12.5)
Monocytes Relative: 7.9 %
Neutro Abs: 6753 cells/uL (ref 1500–7800)
Neutrophils Relative %: 73.4 %
Platelets: 354 10*3/uL (ref 140–400)
RBC: 4.74 10*6/uL (ref 4.20–5.80)
RDW: 15.1 % — ABNORMAL HIGH (ref 11.0–15.0)
Total Lymphocyte: 13.5 %
WBC: 9.2 10*3/uL (ref 3.8–10.8)

## 2018-04-04 LAB — IRON,TIBC AND FERRITIN PANEL
%SAT: 3 % (calc) — ABNORMAL LOW (ref 20–48)
Ferritin: 13 ng/mL — ABNORMAL LOW (ref 24–380)
Iron: 14 ug/dL — ABNORMAL LOW (ref 50–180)
TIBC: 417 mcg/dL (calc) (ref 250–425)

## 2018-04-04 LAB — HEMOGLOBIN A1C
EAG (MMOL/L): 7.6 (calc)
Hgb A1c MFr Bld: 6.4 % of total Hgb — ABNORMAL HIGH (ref <5.7)
Mean Plasma Glucose: 137 (calc)

## 2018-04-04 LAB — TEST AUTHORIZATION

## 2018-04-05 ENCOUNTER — Other Ambulatory Visit: Payer: Self-pay

## 2018-04-05 ENCOUNTER — Other Ambulatory Visit: Payer: Self-pay | Admitting: Nurse Practitioner

## 2018-04-05 MED ORDER — FUROSEMIDE 20 MG PO TABS: 20.0000 mg | ORAL_TABLET | ORAL | 2 refills | Status: DC

## 2018-04-05 NOTE — Telephone Encounter (Signed)
RX was d/c'ed at hospital

## 2018-04-09 ENCOUNTER — Ambulatory Visit: Payer: Medicare Other | Admitting: Gastroenterology

## 2018-04-18 ENCOUNTER — Other Ambulatory Visit: Payer: Self-pay | Admitting: *Deleted

## 2018-04-18 DIAGNOSIS — M545 Low back pain: Principal | ICD-10-CM

## 2018-04-18 DIAGNOSIS — G8929 Other chronic pain: Secondary | ICD-10-CM

## 2018-04-18 MED ORDER — HYDROCODONE-ACETAMINOPHEN 10-325 MG PO TABS: 1.0000 | ORAL_TABLET | Freq: Four times a day (QID) | ORAL | 0 refills | Status: DC | PRN

## 2018-04-18 NOTE — Telephone Encounter (Signed)
Patient requested refill [place] Verified LR: 03/18/2018 Pended Rx and sent to [facility] for approval.

## 2018-05-16 ENCOUNTER — Other Ambulatory Visit: Payer: Self-pay | Admitting: *Deleted

## 2018-05-16 DIAGNOSIS — G8929 Other chronic pain: Secondary | ICD-10-CM

## 2018-05-16 DIAGNOSIS — M545 Low back pain: Principal | ICD-10-CM

## 2018-05-16 MED ORDER — HYDROCODONE-ACETAMINOPHEN 10-325 MG PO TABS: 1.0000 | ORAL_TABLET | Freq: Four times a day (QID) | ORAL | 0 refills | Status: DC | PRN

## 2018-05-16 NOTE — Telephone Encounter (Signed)
Patient Requested refill [place] Verified LR: 04/19/2018 Pended Rx and sent to [facility] for approval.

## 2018-05-25 ENCOUNTER — Other Ambulatory Visit: Payer: Self-pay | Admitting: Nurse Practitioner

## 2018-05-25 DIAGNOSIS — E0865 Diabetes mellitus due to underlying condition with hyperglycemia: Principal | ICD-10-CM

## 2018-05-25 DIAGNOSIS — N183 Chronic kidney disease, stage 3 (moderate): Principal | ICD-10-CM

## 2018-05-25 DIAGNOSIS — IMO0002 Reserved for concepts with insufficient information to code with codable children: Secondary | ICD-10-CM

## 2018-05-25 DIAGNOSIS — E0822 Diabetes mellitus due to underlying condition with diabetic chronic kidney disease: Secondary | ICD-10-CM

## 2018-05-28 ENCOUNTER — Other Ambulatory Visit: Payer: Self-pay | Admitting: *Deleted

## 2018-05-28 MED ORDER — APIXABAN 2.5 MG PO TABS: 2.5000 mg | ORAL_TABLET | Freq: Two times a day (BID) | ORAL | 2 refills | Status: DC

## 2018-05-28 NOTE — Telephone Encounter (Signed)
[employer]

## 2018-06-17 ENCOUNTER — Other Ambulatory Visit: Payer: Self-pay | Admitting: *Deleted

## 2018-06-17 DIAGNOSIS — M545 Low back pain, unspecified: Secondary | ICD-10-CM

## 2018-06-17 DIAGNOSIS — G8929 Other chronic pain: Secondary | ICD-10-CM

## 2018-06-17 MED ORDER — HYDROCODONE-ACETAMINOPHEN 10-325 MG PO TABS: 1.0000 | ORAL_TABLET | Freq: Four times a day (QID) | ORAL | 0 refills | Status: DC | PRN

## 2018-06-17 NOTE — Telephone Encounter (Signed)
Patient requested [place] Verified LR: 05/17/2018 Pended Rx and sent to [facility] for approval .

## 2018-07-02 ENCOUNTER — Other Ambulatory Visit: Payer: Self-pay | Admitting: Nurse Practitioner

## 2018-07-02 DIAGNOSIS — IMO0002 Reserved for concepts with insufficient information to code with codable children: Secondary | ICD-10-CM

## 2018-07-02 DIAGNOSIS — E1165 Type 2 diabetes mellitus with hyperglycemia: Principal | ICD-10-CM

## 2018-07-02 DIAGNOSIS — E1122 Type 2 diabetes mellitus with diabetic chronic kidney disease: Secondary | ICD-10-CM

## 2018-07-02 DIAGNOSIS — N182 Chronic kidney disease, stage 2 (mild): Principal | ICD-10-CM

## 2018-07-15 ENCOUNTER — Other Ambulatory Visit: Payer: Self-pay | Admitting: *Deleted

## 2018-07-15 DIAGNOSIS — M545 Low back pain: Principal | ICD-10-CM

## 2018-07-15 DIAGNOSIS — G8929 Other chronic pain: Secondary | ICD-10-CM

## 2018-07-15 NOTE — Telephone Encounter (Signed)
Patient requested refill [place] Verified LR: 06/17/2018 Pended Rx and sent to [facility] for approval.  

## 2018-07-17 MED ORDER — HYDROCODONE-ACETAMINOPHEN 10-325 MG PO TABS: 1.0000 | ORAL_TABLET | Freq: Four times a day (QID) | ORAL | 0 refills | Status: DC | PRN

## 2018-08-11 ENCOUNTER — Encounter: Payer: Self-pay | Admitting: Nurse Practitioner

## 2018-08-15 ENCOUNTER — Other Ambulatory Visit: Payer: Self-pay | Admitting: *Deleted

## 2018-08-15 DIAGNOSIS — G8929 Other chronic pain: Secondary | ICD-10-CM

## 2018-08-15 DIAGNOSIS — M545 Low back pain: Principal | ICD-10-CM

## 2018-08-15 MED ORDER — HYDROCODONE-ACETAMINOPHEN 10-325 MG PO TABS: 1.0000 | ORAL_TABLET | Freq: Four times a day (QID) | ORAL | 0 refills | Status: DC | PRN

## 2018-08-15 NOTE — Telephone Encounter (Signed)
Patient requested refill [place] Verified LR: 07/17/2018 Pended Rx and sent to [facility] for approval.

## 2018-08-19 ENCOUNTER — Other Ambulatory Visit: Payer: Self-pay | Admitting: Nurse Practitioner

## 2018-08-19 NOTE — Telephone Encounter (Signed)
Called patient to discuss blood pressure olmesartan-amlodipine-hctz he stated he is not taking it but is taking doxazosin and does not feel that is working for him. Patient has scheduled a tele visit for 5/5.I can not just cancel this request would you like to go ahead and refill the doxazosin or deny the request and wait and discuss patient blood pressure tomorrow?

## 2018-08-19 NOTE — Telephone Encounter (Signed)
Yes lets hold off on refilling this until we talk with him tomorrow. Make sure he has taken his blood pressure at home and has home readings to report for our tele-visit. Thank you

## 2018-08-20 ENCOUNTER — Encounter: Payer: Self-pay | Admitting: Nurse Practitioner

## 2018-08-20 ENCOUNTER — Ambulatory Visit (INDEPENDENT_AMBULATORY_CARE_PROVIDER_SITE_OTHER): Payer: Medicare Other | Admitting: Nurse Practitioner

## 2018-08-20 ENCOUNTER — Other Ambulatory Visit: Payer: Self-pay

## 2018-08-20 DIAGNOSIS — I509 Heart failure, unspecified: Secondary | ICD-10-CM | POA: Diagnosis not present

## 2018-08-20 DIAGNOSIS — I10 Essential (primary) hypertension: Secondary | ICD-10-CM | POA: Diagnosis not present

## 2018-08-20 DIAGNOSIS — I480 Paroxysmal atrial fibrillation: Secondary | ICD-10-CM | POA: Diagnosis not present

## 2018-08-20 MED ORDER — DOXAZOSIN MESYLATE 8 MG PO TABS: 8.0000 mg | ORAL_TABLET | Freq: Every day | ORAL | 5 refills | Status: DC

## 2018-08-20 MED ORDER — METOPROLOL SUCCINATE ER 50 MG PO TB24: 50.0000 mg | ORAL_TABLET | Freq: Every day | ORAL | 1 refills | Status: DC

## 2018-08-20 NOTE — Patient Instructions (Addendum)
Make sure you are taking metoprolol succinate 50 mg daily AND doxazosin 8 mg daily for blood pressure    DASH Eating Plan DASH stands for "Dietary Approaches to Stop Hypertension." The DASH eating plan is a healthy eating plan that has been shown to reduce high blood pressure (hypertension). It may also reduce your risk for type 2 diabetes, heart disease, and stroke. The DASH eating plan may also help with weight loss. What are tips for following this plan?  General guidelines  Avoid eating more than 2,300 mg (milligrams) of salt (sodium) a day. If you have hypertension, you may need to reduce your sodium intake to 1,500 mg a day.  Limit alcohol intake to no more than 1 drink a day for nonpregnant women and 2 drinks a day for men. One drink equals 12 oz of beer, 5 oz of wine, or 1 oz of hard liquor.  Work with your health care provider to maintain a healthy body weight or to lose weight. Ask what an ideal weight is for you.  Get at least 30 minutes of exercise that causes your heart to beat faster (aerobic exercise) most days of the week. Activities may include walking, swimming, or biking.  Work with your health care provider or diet and nutrition specialist (dietitian) to adjust your eating plan to your individual calorie needs. Reading food labels   Check food labels for the amount of sodium per serving. Choose foods with less than 5 percent of the Daily Value of sodium. Generally, foods with less than 300 mg of sodium per serving fit into this eating plan.  To find whole grains, look for the word "whole" as the first word in the ingredient list. Shopping  Buy products labeled as "low-sodium" or "no salt added."  Buy fresh foods. Avoid canned foods and premade or frozen meals. Cooking  Avoid adding salt when cooking. Use salt-free seasonings or herbs instead of table salt or sea salt. Check with your health care provider or pharmacist before using salt substitutes.  Do not fry  foods. Cook foods using healthy methods such as baking, boiling, grilling, and broiling instead.  Cook with heart-healthy oils, such as olive, canola, soybean, or sunflower oil. Meal planning  Eat a balanced diet that includes: ? 5 or more servings of fruits and vegetables each day. At each meal, try to fill half of your plate with fruits and vegetables. ? Up to 6-8 servings of whole grains each day. ? Less than 6 oz of lean meat, poultry, or fish each day. A 3-oz serving of meat is about the same size as a deck of cards. One egg equals 1 oz. ? 2 servings of low-fat dairy each day. ? A serving of nuts, seeds, or beans 5 times each week. ? Heart-healthy fats. Healthy fats called Omega-3 fatty acids are found in foods such as flaxseeds and coldwater fish, like sardines, salmon, and mackerel.  Limit how much you eat of the following: ? Canned or prepackaged foods. ? Food that is high in trans fat, such as fried foods. ? Food that is high in saturated fat, such as fatty meat. ? Sweets, desserts, sugary drinks, and other foods with added sugar. ? Full-fat dairy products.  Do not salt foods before eating.  Try to eat at least 2 vegetarian meals each week.  Eat more home-cooked food and less restaurant, buffet, and fast food.  When eating at a restaurant, ask that your food be prepared with less salt or no  salt, if possible. What foods are recommended? The items listed may not be a complete list. Talk with your dietitian about what dietary choices are best for you. Grains Whole-grain or whole-wheat bread. Whole-grain or whole-wheat pasta. Brown rice. [place]. Bulgur. Whole-grain and low-sodium cereals. Pita bread. Low-fat, low-sodium crackers. Whole-wheat flour tortillas. Vegetables Fresh or frozen vegetables (raw, steamed, roasted, or grilled). Low-sodium or reduced-sodium tomato and vegetable juice. Low-sodium or reduced-sodium tomato sauce and tomato paste. Low-sodium or  reduced-sodium canned vegetables. Fruits All fresh, dried, or frozen fruit. Canned fruit in natural juice (without added sugar). Meat and other protein foods Skinless chicken or [place]. Ground chicken or [place]. Pork with fat trimmed off. Fish and seafood. Egg whites. Dried beans, peas, or lentils. Unsalted nuts, nut butters, and seeds. Unsalted canned beans. Lean cuts of beef with fat trimmed off. Low-sodium, lean deli meat. Dairy Low-fat (1%) or fat-free (skim) milk. Fat-free, low-fat, or reduced-fat cheeses. Nonfat, low-sodium ricotta or cottage cheese. Low-fat or nonfat yogurt. Low-fat, low-sodium cheese. Fats and oils Soft margarine without trans fats. Vegetable oil. Low-fat, reduced-fat, or light mayonnaise and salad dressings (reduced-sodium). Canola, safflower, olive, soybean, and sunflower oils. Avocado. Seasoning and other foods Herbs. Spices. Seasoning mixes without salt. Unsalted popcorn and pretzels. Fat-free sweets. What foods are not recommended? The items listed may not be a complete list. Talk with your dietitian about what dietary choices are best for you. Grains Baked goods made with fat, such as croissants, muffins, or some breads. Dry pasta or rice meal packs. Vegetables Creamed or fried vegetables. Vegetables in a cheese sauce. Regular canned vegetables (not low-sodium or reduced-sodium). Regular canned tomato sauce and paste (not low-sodium or reduced-sodium). Regular tomato and vegetable juice (not low-sodium or reduced-sodium). Angie Fava. Olives. Fruits Canned fruit in a light or heavy syrup. Fried fruit. Fruit in cream or butter sauce. Meat and other protein foods Fatty cuts of meat. Ribs. Fried meat. Berniece Salines. Sausage. Bologna and other processed lunch meats. Salami. Fatback. Hotdogs. Bratwurst. Salted nuts and seeds. Canned beans with added salt. Canned or smoked fish. Whole eggs or egg yolks. Chicken or [place] with skin. Dairy Whole or 2% milk, cream, and half-and-half.  Whole or full-fat cream cheese. Whole-fat or sweetened yogurt. Full-fat cheese. Nondairy creamers. Whipped toppings. Processed cheese and cheese spreads. Fats and oils Butter. Stick margarine. Lard. Shortening. Ghee. Bacon fat. Tropical oils, such as coconut, palm kernel, or palm oil. Seasoning and other foods Salted popcorn and pretzels. Onion salt, garlic salt, seasoned salt, table salt, and sea salt. Worcestershire sauce. Tartar sauce. Barbecue sauce. Teriyaki sauce. Soy sauce, including reduced-sodium. Steak sauce. Canned and packaged gravies. Fish sauce. Oyster sauce. Cocktail sauce. Horseradish that you find on the shelf. Ketchup. Mustard. Meat flavorings and tenderizers. Bouillon cubes. Hot sauce and Tabasco sauce. Premade or packaged marinades. Premade or packaged taco seasonings. Relishes. Regular salad dressings. Where to find more information:  National Heart, Lung, and [place]: https://wilson-eaton.com/  American Heart Association: www.heart.org Summary  The DASH eating plan is a healthy eating plan that has been shown to reduce high blood pressure (hypertension). It may also reduce your risk for type 2 diabetes, heart disease, and stroke.  With the DASH eating plan, you should limit salt (sodium) intake to 2,300 mg a day. If you have hypertension, you may need to reduce your sodium intake to 1,500 mg a day.  When on the DASH eating plan, aim to eat more fresh fruits and vegetables, whole grains, lean proteins, low-fat dairy, and heart-healthy  fats.  Work with your health care provider or diet and nutrition specialist (dietitian) to adjust your eating plan to your individual calorie needs. This information is not intended to replace advice given to you by your health care provider. Make sure you discuss any questions you have with your health care provider. Document Released: 03/23/2011 Document Revised: 03/27/2016 Document Reviewed: 03/27/2016 Elsevier Interactive Patient Education   2019 [employer].

## 2018-08-20 NOTE — Progress Notes (Signed)
This service is provided via telemedicine  No vital signs collected/recorded due to the encounter was a telemedicine visit.   Location of Douglas Soto (ex: home, work):  Home  Douglas Soto consents to a telephone visit:  Yes  Location of the provider (ex: office, home):  [facility], Office   Name of any referring provider: N/A  Names of all persons participating in the telemedicine service and their role in the encounter:  S.Chrae B/CMA, Sherrie Mustache, NP, and Douglas Soto    Time spent on call:  5 min with medical assistant   Virtual Visit via Telephone Note  I connected with Douglas Soto on 08/20/18 at  8:30 AM EDT by telephone and verified that I am speaking with the correct person using two identifiers.  Location: Douglas Soto: home Provider: office   I discussed the limitations, risks, security and privacy concerns of performing an evaluation and management service by telephone and the availability of in person appointments. I also discussed with the Douglas Soto that there may be a Douglas Soto responsible charge related to this service. The Douglas Soto expressed understanding and agreed to proceed.      Careteam: Douglas Soto Care Team: Lauree Chandler, NP as PCP - General (Geriatric Medicine)  Advanced Directive information Does Douglas Soto Have a Medical Advance Directive?: No, Does Douglas Soto want to make changes to medical advance directive?: No - Douglas Soto declined  Allergies  Allergen Reactions  . Allopurinol Other (See Comments)    Whelps on body and sores in mouth     Chief Complaint  Douglas Soto presents with  . Follow-up    Blood pressure medication follow-up.Televisit      HPI: Douglas Soto is a 78 y.o. male for blood pressure follow up.  Needs to get medication refill but needs clarification.  Pt reports he needs refill on doxazosin 8 mg but "this is not doing the job".   Blood pressure at home 151/93 HR 91 (did not give any additional readings) states he felt his blood pressure  getting high but no specific symptoms. Denies headache, chest pains, palpitations.   Also on lasix 20 mg by mouth daily. Not taking metoprolol, unsure why.   Review of Systems:  Review of Systems  Constitutional: Negative for chills, fever and weight loss.  HENT: Negative for tinnitus.   Respiratory: Negative for cough, sputum production and shortness of breath.   Cardiovascular: Negative for chest pain, palpitations and leg swelling.  Gastrointestinal: Negative for abdominal pain, constipation and diarrhea.  Skin: Negative.   Neurological: Negative for dizziness and headaches.    Past Medical History:  Diagnosis Date  . Cholelithiasis 57/8469   uncomplicated.    . Chronic kidney disease, stage II (mild)   . Depression   . Diabetes mellitus without complication ([place])   . Essential hypertension, malignant   . Fatty liver 02/2018   on ultrasound  . Gout, unspecified   . Hyperlipidemia   . Lumbago   . Obesity, unspecified   . Pain in joint, pelvic region and thigh   . Pleural effusion on right 02/2018   Past Surgical History:  Procedure Laterality Date  . APPENDECTOMY    . COLONOSCOPY WITH PROPOFOL N/A 02/22/2018   Procedure: COLONOSCOPY WITH PROPOFOL;  Surgeon: Milus Banister, MD;  Location: [facility] ENDOSCOPY;  Service: Endoscopy;  Laterality: N/A;  . CYST EXCISION  1998   back  . ESOPHAGOGASTRODUODENOSCOPY (EGD) WITH PROPOFOL N/A 02/22/2018   Procedure: ESOPHAGOGASTRODUODENOSCOPY (EGD) WITH PROPOFOL;  Surgeon: Milus Banister, MD;  Location: [facility]  ENDOSCOPY;  Service: Endoscopy;  Laterality: N/A;  . POLYPECTOMY  02/22/2018   Procedure: POLYPECTOMY;  Surgeon: Milus Banister, MD;  Location: [facility] ENDOSCOPY;  Service: Endoscopy;;  . 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 His smokeless tobacco use includes chew. He reports previous alcohol use of about 5.0 standard drinks of alcohol per week. He  reports that he does not use drugs.  Family History  Problem Relation Age of Onset  . Heart Problems Father   . Heart disease Brother   . Diabetes Brother   . Diabetes Brother   . Heart Problems Mother   . Pneumonia Maternal Grandfather     Medications: Douglas Soto's Medications  New Prescriptions   No medications on file  Previous Medications   APIXABAN (ELIQUIS) 2.5 MG TABS TABLET    Take 1 tablet (2.5 mg total) by mouth 2 (two) times daily.   DOXAZOSIN (CARDURA) 8 MG TABLET    TAKE 1 TABLET BY MOUTH ONCE DAILY FOR BLOOD PRESSURE   FERROUS SULFATE 325 (65 FE) MG TABLET    Take 1 tablet (325 mg total) by mouth 2 (two) times daily with a meal.   FUROSEMIDE (LASIX) 20 MG TABLET    Take 1 tablet (20 mg total) by mouth every other day.   HYDROCODONE-ACETAMINOPHEN (NORCO) 10-325 MG TABLET    Take 1 tablet by mouth every 6 (six) hours as needed (pain).   JARDIANCE 25 MG TABS TABLET    TAKE 1 TABLET BY MOUTH EVERY MORNING TO CONTROL DIABETES   METFORMIN (GLUCOPHAGE) 1000 MG TABLET    TAKE 1 TABLET BY MOUTH TWICE DAILY WITH MEALS   METOPROLOL SUCCINATE (TOPROL-XL) 50 MG 24 HR TABLET    Take 1 tablet (50 mg total) by mouth daily. Take with or immediately following a meal.   MULTIVITAMIN-IRON-MINERALS-FOLIC ACID (CENTRUM) CHEWABLE TABLET    Chew 1 tablet by mouth daily.   ROSUVASTATIN (CRESTOR) 5 MG TABLET    TAKE 1 TABLET BY MOUTH ONCE DAILY  Modified Medications   No medications on file  Discontinued Medications   No medications on file     Physical Exam:unable due to televisit  Labs reviewed: Basic Metabolic Panel: Recent Labs    02/23/18 0452 02/24/18 0542 02/25/18 0630 04/01/18 1559 04/02/18  NA 136 134* 134* 140 140  K 3.1* 3.3* 3.5 4.4 4.4  CL 98 97* 96* 101  --   CO2 26 30 29 30   --   GLUCOSE 90 115* 149* 117  --   BUN 24* 24* 24* 21 21  CREATININE 2.37* 2.21* 2.06* 1.68* 1.7*  CALCIUM 9.0 9.0 9.1 10.2  --   PHOS 3.2 2.2* 2.0*  --   --    Liver Function Tests: Recent  Labs    12/24/17 1335  02/19/18 2257 02/23/18 0452 02/24/18 0542 02/25/18 0630 04/01/18 1559 04/02/18  AST 12  --  14*  --   --   --  12 12*  ALT 12  --  14  --   --   --  12 12  ALKPHOS  --   --  55  --   --   --   --  83  BILITOT 0.7  --  1.2  --   --   --  0.5  --   PROT 5.8*  --  5.6*  --   --   --  6.7  --   ALBUMIN  --    < > 3.1* 3.4* 3.0* 3.2*  --   --    < > = values in this interval not displayed.   No results for input(s): LIPASE, AMYLASE in the last 8760 hours. No results for input(s): AMMONIA in the last 8760 hours. CBC: Recent Labs    02/23/18 0452 02/24/18 0542 04/01/18 1559 04/02/18  WBC 8.5 10.1 9.2 9.2  NEUTROABS 5.6 6.9 6,753  --   HGB 8.5* 8.5* 10.8* 10.8*  HCT 29.1* 29.5* 36.0* 36*  MCV 84.8 83.8 75.9*  --   PLT 190 197 354 354   Lipid Panel: Recent Labs    09/11/17 1346 12/24/17 1335  CHOL 147 135  HDL 50 35*  LDLCALC 78 81  TRIG 106 95  CHOLHDL 2.9 3.9   TSH: No results for input(s): TSH in the last 8760 hours. A1C: Lab Results  Component Value Date   HGBA1C 6.4 04/02/2018     Assessment/Plan 1. PAF (paroxysmal atrial fibrillation) (HCC) Stable,  without palpitations, continues on eliquis for anticoagulation. Not on metoprolol for rate. Will restart at this time. - metoprolol succinate (TOPROL-XL) 50 MG 24 hr tablet; Take 1 tablet (50 mg total) by mouth daily. Take with or immediately following a meal.  Dispense: 90 tablet; Refill: 1  2. Congestive heart failure, unspecified HF chronicity, unspecified heart failure type (HCC) Stable, no signs of worsening heart failure at this time. Not taking metoprolol for blood pressure, encouraged to restart. Continues on lasix.  - metoprolol succinate (TOPROL-XL) 50 MG 24 hr tablet; Take 1 tablet (50 mg total) by mouth daily. Take with or immediately following a meal.  Dispense: 90 tablet; Refill: 1  3. Essential hypertension -not controlled. Continues on doxazosin but metoprolol also on  medication list however he is not taking.  New Rx sent to pharmacy, DASH diet also provided.  - metoprolol succinate (TOPROL-XL) 50 MG 24 hr tablet; Take 1 tablet (50 mg total) by mouth daily. Take with or immediately following a meal.  Dispense: 90 tablet; Refill: 1 - doxazosin (CARDURA) 8 MG tablet; Take 1 tablet (8 mg total) by mouth daily.  Dispense: 30 tablet; Refill: 5  Next appt: 08/30/2018 for televisit for routine follow up and blood pressure recheck  [place] Gipe K. Harle Battiest  [facility] [phone]   Follow Up Instructions:    I discussed the assessment and treatment plan with the Douglas Soto. The Douglas Soto was provided an opportunity to ask questions and all were answered. The Douglas Soto agreed with the plan and demonstrated an understanding of the instructions.   The Douglas Soto was advised to call back or seek an in-person evaluation if the symptoms worsen or if the condition fails to improve as anticipated.  I provided 16 minutes of non-face-to-face time during this encounter.   Lauree Chandler, NP avs printed and mailed.

## 2018-08-22 ENCOUNTER — Other Ambulatory Visit: Payer: Self-pay | Admitting: Nurse Practitioner

## 2018-08-22 ENCOUNTER — Telehealth: Payer: Self-pay | Admitting: *Deleted

## 2018-08-22 MED ORDER — AMLODIPINE BESYLATE 5 MG PO TABS: 5.0000 mg | ORAL_TABLET | Freq: Every day | ORAL | 3 refills | Status: DC

## 2018-08-22 NOTE — Telephone Encounter (Signed)
To have him to continue metoprolol as this benefits the heart but will ADD norvasc 5 mg by mouth daily (adding this to his current regimen)   Continue to check blood pressure but make sure it is after he has checked blood pressure medication and he has been sitting for ~ 5 mins.

## 2018-08-22 NOTE — Telephone Encounter (Signed)
Patient called and stated that he was calling to give you blood pressure readings:  08/21/2018- 12:38pm 139/69 Pulse 83  5:55pm  155/64  Pulse 79  9:00pm  184/81  Pulse 75 08/22/2018- am "it was real High" 190/80  No symptoms but "worried to death" Patient stated that since taking the Metoprolol it has gone way up. Patient stated that something has to be done. Please Advise.

## 2018-08-23 NOTE — Telephone Encounter (Signed)
Discussed Douglas Soto's response with patient. Patient verbalized understanding and stated he feels better today. Patient plans to pick-up new rx and will continue to take B/P as instructed.

## 2018-08-30 ENCOUNTER — Other Ambulatory Visit: Payer: Self-pay

## 2018-08-30 ENCOUNTER — Encounter: Payer: Self-pay | Admitting: Nurse Practitioner

## 2018-08-30 ENCOUNTER — Ambulatory Visit (INDEPENDENT_AMBULATORY_CARE_PROVIDER_SITE_OTHER): Payer: Medicare Other | Admitting: Nurse Practitioner

## 2018-08-30 DIAGNOSIS — D649 Anemia, unspecified: Secondary | ICD-10-CM | POA: Diagnosis not present

## 2018-08-30 DIAGNOSIS — N183 Chronic kidney disease, stage 3 unspecified: Secondary | ICD-10-CM

## 2018-08-30 DIAGNOSIS — K5903 Drug induced constipation: Secondary | ICD-10-CM

## 2018-08-30 DIAGNOSIS — E785 Hyperlipidemia, unspecified: Secondary | ICD-10-CM

## 2018-08-30 DIAGNOSIS — I4821 Permanent atrial fibrillation: Secondary | ICD-10-CM | POA: Diagnosis not present

## 2018-08-30 DIAGNOSIS — I10 Essential (primary) hypertension: Secondary | ICD-10-CM | POA: Diagnosis not present

## 2018-08-30 DIAGNOSIS — G8929 Other chronic pain: Secondary | ICD-10-CM

## 2018-08-30 DIAGNOSIS — M545 Low back pain: Secondary | ICD-10-CM

## 2018-08-30 DIAGNOSIS — E1169 Type 2 diabetes mellitus with other specified complication: Secondary | ICD-10-CM

## 2018-08-30 MED ORDER — AMLODIPINE BESYLATE 10 MG PO TABS: 10.0000 mg | ORAL_TABLET | Freq: Every day | ORAL | 2 refills | Status: DC

## 2018-08-30 NOTE — Patient Instructions (Addendum)
Increase amlodipine to 10 mg daily for blood pressure Continue metoprolol twice daily and doxazosin.  Keep follow up as scheduled.

## 2018-08-30 NOTE — Progress Notes (Signed)
Patient ID: Douglas Soto, male   DOB: 1940-12-06, 78 y.o.   MRN: [id number] This service is provided via telemedicine  No vital signs collected/recorded due to the encounter was a telemedicine visit.   Location of patient (ex: home, work):  HOME  Patient consents to a telephone visit: YES  Location of the provider (ex: office, home):  OFFICE  Name of any referring provider:  Sherrie Mustache NP  Names of all persons participating in the telemedicine service and their role in the encounter:  PATIENT, Douglas Soto CMA, Sherrie Mustache NP  Time spent on call:4:09       Careteam: Patient Care Team: Lauree Chandler, NP as PCP - General (Geriatric Medicine)  Advanced Directive information    Allergies  Allergen Reactions  . Allopurinol Other (See Comments)    Whelps on body and sores in mouth     Chief Complaint  Patient presents with  . Medical Management of Chronic Issues    follow-up to discuss BP     HPI: Patient is a 78 y.o. male for follow up on chronic conditions.   htn- has been elevated, has been on cardura 8 mg daily then stated taking metoprolol succinate and cut medication in half because it started making him feel weird, with it cut in half he is able to tolerate it much better. Blood pressure remained high and amlodipine 5 mg by mouth was added. Also taking lasix 20 mg by mouth every other day.  Blood pressure still remains elevated, Blood pressure this morning was 175/80 HR 72. Has noticed a small amount of swelling in feet that started 3 days ago. "not that bad"   DM- does not have any way of checking his blood sugar, "I am sure it is good, doing everything I am suppose to" Feeling good. Has cut out sugar.  Taking metformin 1000 mg by mouth twice daily with meals, jardiance 25 mg daily. A1c 6.4 in December 2019.   A fib- taking eliquis, no palpitations. Rate controled.   Anemia- stable, no signs of bleeding. Taking iron twice daily. Occasional  constipation.   Constipation- occasionally a problem, has not tried stool softener.   Low back pain- continues on hydrocodone-apap which controls pain. If he did not have this he would not be able to get up in the morning. Occasional constipation.    Review of Systems:  Review of Systems  Constitutional: Negative for chills, fever and weight loss.  HENT: Negative for tinnitus.   Respiratory: Negative for cough, sputum production and shortness of breath.   Cardiovascular: Negative for chest pain, palpitations and leg swelling.  Gastrointestinal: Negative for abdominal pain, constipation and diarrhea.  Skin: Negative.   Neurological: Negative for dizziness and headaches.  Psychiatric/Behavioral: Negative for depression and memory loss. The patient does not have insomnia.     Past Medical History:  Diagnosis Date  . Cholelithiasis 80/3212   uncomplicated.    . Chronic kidney disease, stage II (mild)   . Depression   . Diabetes mellitus without complication ([place])   . Essential hypertension, malignant   . Fatty liver 02/2018   on ultrasound  . Gout, unspecified   . Hyperlipidemia   . Lumbago   . Obesity, unspecified   . Pain in joint, pelvic region and thigh   . Pleural effusion on right 02/2018   Past Surgical History:  Procedure Laterality Date  . APPENDECTOMY    . COLONOSCOPY WITH PROPOFOL N/A 02/22/2018   Procedure: COLONOSCOPY WITH  PROPOFOL;  Surgeon: Milus Banister, MD;  Location: [facility] ENDOSCOPY;  Service: Endoscopy;  Laterality: N/A;  . CYST EXCISION  1998   back  . ESOPHAGOGASTRODUODENOSCOPY (EGD) WITH PROPOFOL N/A 02/22/2018   Procedure: ESOPHAGOGASTRODUODENOSCOPY (EGD) WITH PROPOFOL;  Surgeon: Milus Banister, MD;  Location: [facility] ENDOSCOPY;  Service: Endoscopy;  Laterality: N/A;  . POLYPECTOMY  02/22/2018   Procedure: POLYPECTOMY;  Surgeon: Milus Banister, MD;  Location: [facility] ENDOSCOPY;  Service: Endoscopy;;  . 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 His smokeless tobacco use includes chew. He reports previous alcohol use of about 5.0 standard drinks of alcohol per week. He reports that he does not use drugs.  Family History  Problem Relation Age of Onset  . Heart Problems Father   . Heart disease Brother   . Diabetes Brother   . Diabetes Brother   . Heart Problems Mother   . Pneumonia Maternal Grandfather     Medications: Patient's Medications  New Prescriptions   No medications on file  Previous Medications   AMLODIPINE (NORVASC) 5 MG TABLET    Take 1 tablet (5 mg total) by mouth daily.   APIXABAN (ELIQUIS) 2.5 MG TABS TABLET    Take 1 tablet (2.5 mg total) by mouth 2 (two) times daily.   DOXAZOSIN (CARDURA) 8 MG TABLET    Take 1 tablet (8 mg total) by mouth daily.   FERROUS SULFATE 325 (65 FE) MG TABLET    Take 1 tablet (325 mg total) by mouth 2 (two) times daily with a meal.   FUROSEMIDE (LASIX) 20 MG TABLET    Take 1 tablet (20 mg total) by mouth every other day.   HYDROCODONE-ACETAMINOPHEN (NORCO) 10-325 MG TABLET    Take 1 tablet by mouth every 6 (six) hours as needed (pain).   JARDIANCE 25 MG TABS TABLET    TAKE 1 TABLET BY MOUTH EVERY MORNING TO CONTROL DIABETES   METFORMIN (GLUCOPHAGE) 1000 MG TABLET    TAKE 1 TABLET BY MOUTH TWICE DAILY WITH MEALS   METOPROLOL SUCCINATE (TOPROL-XL) 50 MG 24 HR TABLET    Take 1 tablet (50 mg total) by mouth daily. Take with or immediately following a meal.   MULTIVITAMIN-IRON-MINERALS-FOLIC ACID (CENTRUM) CHEWABLE TABLET    Chew 1 tablet by mouth daily.   ROSUVASTATIN (CRESTOR) 5 MG TABLET    TAKE 1 TABLET BY MOUTH ONCE DAILY  Modified Medications   No medications on file  Discontinued Medications   No medications on file     Physical Exam:  Labs reviewed: Basic Metabolic Panel: Recent Labs    02/23/18 0452 02/24/18 0542 02/25/18 0630 04/01/18 1559 04/02/18  NA 136 134* 134* 140 140  K 3.1* 3.3* 3.5 4.4  4.4  CL 98 97* 96* 101  --   CO2 26 30 29 30   --   GLUCOSE 90 115* 149* 117  --   BUN 24* 24* 24* 21 21  CREATININE 2.37* 2.21* 2.06* 1.68* 1.7*  CALCIUM 9.0 9.0 9.1 10.2  --   PHOS 3.2 2.2* 2.0*  --   --    Liver Function Tests: Recent Labs    12/24/17 1335  02/19/18 2257 02/23/18 0452 02/24/18 0542 02/25/18 0630 04/01/18 1559 04/02/18  AST 12  --  14*  --   --   --  12 12*  ALT 12  --  14  --   --   --  12 12  ALKPHOS  --   --  55  --   --   --   --  83  BILITOT 0.7  --  1.2  --   --   --  0.5  --   PROT 5.8*  --  5.6*  --   --   --  6.7  --   ALBUMIN  --    < > 3.1* 3.4* 3.0* 3.2*  --   --    < > = values in this interval not displayed.   No results for input(s): LIPASE, AMYLASE in the last 8760 hours. No results for input(s): AMMONIA in the last 8760 hours. CBC: Recent Labs    02/23/18 0452 02/24/18 0542 04/01/18 1559 04/02/18  WBC 8.5 10.1 9.2 9.2  NEUTROABS 5.6 6.9 6,753  --   HGB 8.5* 8.5* 10.8* 10.8*  HCT 29.1* 29.5* 36.0* 36*  MCV 84.8 83.8 75.9*  --   PLT 190 197 354 354   Lipid Panel: Recent Labs    09/11/17 1346 12/24/17 1335  CHOL 147 135  HDL 50 35*  LDLCALC 78 81  TRIG 106 95  CHOLHDL 2.9 3.9   TSH: No results for input(s): TSH in the last 8760 hours. A1C: Lab Results  Component Value Date   HGBA1C 6.4 04/02/2018     Assessment/Plan 1. Essential hypertension -not controlled, continues on metoprolol and doxazosin. Amlodipine 5 mg by mouth daily was added but without proper control. Will increase to 10 mg by mouth daily at this time. Continue low sodium diet.   2. Permanent atrial fibrillation Rate controlled, continues on metoprolol for rate, continues on eliquis 2.5 mg BID for anticoagulation.   3. CKD (chronic kidney disease) stage 3, GFR 30-59 ml/min (HCC) -Encourage proper hydration and to avoid NSAIDS (Aleve, Advil, Motrin, Ibuprofen)  - COMPLETE METABOLIC PANEL WITH GFR; Future  4. Anemia, unspecified type -continues on  iron supplements  - CBC with Differential/Platelet; Future  5. Type 2 diabetes mellitus with other specified complication, without long-term current use of insulin (HCC) -does not take blood sugar at home but feels like diabetes in under control. Has made dietary modifications. Continues on metformin 1000 mg by mouth twice daily  - Hemoglobin A1c; Future  6. Chronic midline low back pain without sciatica Stable, controlled on hydrocodone-apap which he has been complaint with and gets from our office   7. Drug-induced constipation Due to hydrocodone-apap, and iron. Encouraged fluids and stool softener.   8. Hyperlipidemia LDL goal <70 -continues on crestor with dietary modifications.  - COMPLETE METABOLIC PANEL WITH GFR; Future - Lipid Panel; Future   Next appt: 09/02/2018 for labs and BP check in office Pt agreeable to AWV on 09/04/2018 via televisit.  Carlos American. Harle Battiest  [facility] [phone]   Virtual Visit via Telephone Note  I connected with pt on 08/30/18 at 10:00 AM EDT by telephone and verified that I am speaking with the correct person using two identifiers.  Location: Patient: home Provider: office   I discussed the limitations, risks, security and privacy concerns of performing an evaluation and management service by telephone and the availability of in person appointments. I also discussed with the patient that there may be a patient responsible charge related to this service. The patient expressed understanding and agreed to proceed.   I discussed the assessment and treatment plan with the patient. The patient was provided an opportunity to ask questions and all  were answered. The patient agreed with the plan and demonstrated an understanding of the instructions.   The patient was advised to call back or seek an in-person evaluation if the symptoms worsen or if the condition fails to improve as anticipated.  I provided 25 minutes  of non-face-to-face time during this encounter.  Carlos American. Harle Battiest Avs printed and mailed

## 2018-09-02 ENCOUNTER — Ambulatory Visit: Payer: Medicare Other | Admitting: Nurse Practitioner

## 2018-09-04 ENCOUNTER — Encounter: Payer: Medicare Other | Admitting: Nurse Practitioner

## 2018-09-13 ENCOUNTER — Encounter: Payer: Self-pay | Admitting: Nurse Practitioner

## 2018-09-13 ENCOUNTER — Ambulatory Visit (INDEPENDENT_AMBULATORY_CARE_PROVIDER_SITE_OTHER): Payer: Medicare Other | Admitting: Nurse Practitioner

## 2018-09-13 ENCOUNTER — Other Ambulatory Visit: Payer: Self-pay | Admitting: *Deleted

## 2018-09-13 ENCOUNTER — Other Ambulatory Visit: Payer: Self-pay

## 2018-09-13 DIAGNOSIS — N183 Chronic kidney disease, stage 3 unspecified: Secondary | ICD-10-CM

## 2018-09-13 DIAGNOSIS — Z0000 Encounter for general adult medical examination without abnormal findings: Secondary | ICD-10-CM | POA: Diagnosis not present

## 2018-09-13 DIAGNOSIS — I10 Essential (primary) hypertension: Secondary | ICD-10-CM

## 2018-09-13 DIAGNOSIS — G8929 Other chronic pain: Secondary | ICD-10-CM

## 2018-09-13 MED ORDER — OLMESARTAN-AMLODIPINE-HCTZ 20-5-12.5 MG PO TABS: 1.0000 | ORAL_TABLET | Freq: Every day | ORAL | 1 refills | Status: DC

## 2018-09-13 NOTE — Patient Instructions (Addendum)
STOP plain amlodipine tablet  Start Olmesartan-amLODIPine-HCTZ combination tablet Still take Cardura and Metoprolol   About 1 hour after taking your blood pressure medication- sit for around 5 mins and then take blood pressure. Write this down and bring to office with you for your appt.

## 2018-09-13 NOTE — Progress Notes (Signed)
This service is provided via telemedicine    No vital signs collected/recorded due to the encounter was a telemedicine visit.   Location of patient (ex: home, work): Home  Patient consents to a telephone visit:  Yes  Location of the provider (ex: office, home): Home  Name of any referring provider: N/A  Names of all persons participating in the telemedicine service and their role in the encounter:  S.Chrae B/CMA, Sherrie Mustache, NP, and Patient       Careteam: Patient Care Team: Lauree Chandler, NP as PCP - General (Geriatric Medicine)  Advanced Directive information    Allergies  Allergen Reactions  . Allopurinol Other (See Comments)    Whelps on body and sores in mouth     Chief Complaint  Patient presents with  . Follow-up    Blood pressure follow-up. Telephone visit      HPI: Patient is a 78 y.o. male  for follow up on blood pressure   Hypertension- has been elevated, medication was adjusted in hospital and blood pressure had been running high at home. He was not taking lopressor but this was added back however blood pressure remained uncontrolled. Amlodipine 5 mg by mouth daily was added. No side effects noted. No swelling.  Working on diet.   142/76 83 HR 164/80  154/77 84 HR 136/68 80 HR   CKD- prior history of carcinoma of kidney status post resection 2011-pt never followed up with nephrologist, reports he was "tired of it"  urine is and has been doing well. "if the kidneys go they go"   Review of Systems:  Review of Systems  Constitutional: Negative for chills, fever and weight loss.  HENT: Negative for tinnitus.   Respiratory: Negative for cough, sputum production and shortness of breath.   Cardiovascular: Negative for chest pain, palpitations and leg swelling.  Gastrointestinal: Negative for abdominal pain, constipation and diarrhea.  Skin: Negative.   Neurological: Negative for dizziness and headaches.  Psychiatric/Behavioral:  Negative for depression and memory loss. The patient does not have insomnia.     Past Medical History:  Diagnosis Date  . Cholelithiasis 67/2094   uncomplicated.    . Chronic kidney disease, stage II (mild)   . Depression   . Diabetes mellitus without complication ([place])   . Essential hypertension, malignant   . Fatty liver 02/2018   on ultrasound  . Gout, unspecified   . Hyperlipidemia   . Lumbago   . Obesity, unspecified   . Pain in joint, pelvic region and thigh   . Pleural effusion on right 02/2018   Past Surgical History:  Procedure Laterality Date  . APPENDECTOMY    . COLONOSCOPY WITH PROPOFOL N/A 02/22/2018   Procedure: COLONOSCOPY WITH PROPOFOL;  Surgeon: Milus Banister, MD;  Location: [facility] END[facility]COPY;  Service: Endoscopy;  Laterality: N/A;  . CYST EXCISION  1998   back  . ESOPHAGOGASTRODUODENOSCOPY (EGD) WITH PROPOFOL N/A 02/22/2018   Procedure: ESOPHAGOGASTRODUODENOSCOPY (EGD) WITH PROPOFOL;  Surgeon: Milus Banister, MD;  Location: [facility] ENDOSCOPY;  Service: Endoscopy;  Laterality: N/A;  . POLYPECTOMY  02/22/2018   Procedure: POLYPECTOMY;  Surgeon: Milus Banister, MD;  Location: [facility] ENDOSCOPY;  Service: Endoscopy;;  . 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 His smokeless tobacco use includes chew. He reports previous alcohol use of about 5.0 standard drinks of alcohol per week. He reports that he does not use  drugs.  Family History  Problem Relation Age of Onset  . Heart Problems Father   . Heart disease Brother   . Diabetes Brother   . Diabetes Brother   . Heart Problems Mother   . Pneumonia Maternal Grandfather     Medications: Patient's Medications  New Prescriptions   No medications on file  Previous Medications   AMLODIPINE (NORVASC) 10 MG TABLET    Take 1 tablet (10 mg total) by mouth daily.   APIXABAN (ELIQUIS) 2.5 MG TABS TABLET    Take 1 tablet (2.5 mg total) by  mouth 2 (two) times daily.   DOXAZOSIN (CARDURA) 8 MG TABLET    Take 1 tablet (8 mg total) by mouth daily.   FERROUS SULFATE 325 (65 FE) MG TABLET    Take 1 tablet (325 mg total) by mouth 2 (two) times daily with a meal.   FUROSEMIDE (LASIX) 20 MG TABLET    Take 1 tablet (20 mg total) by mouth every other day.   HYDROCODONE-ACETAMINOPHEN (NORCO) 10-325 MG TABLET    Take 1 tablet by mouth every 6 (six) hours as needed (pain).   JARDIANCE 25 MG TABS TABLET    TAKE 1 TABLET BY MOUTH EVERY MORNING TO CONTROL DIABETES   METFORMIN (GLUCOPHAGE) 1000 MG TABLET    TAKE 1 TABLET BY MOUTH TWICE DAILY WITH MEALS   METOPROLOL SUCCINATE (TOPROL-XL) 50 MG 24 HR TABLET    Take 1 tablet (50 mg total) by mouth daily. Take with or immediately following a meal.   MULTIVITAMIN-IRON-MINERALS-FOLIC ACID (CENTRUM) CHEWABLE TABLET    Chew 1 tablet by mouth daily.   ROSUVASTATIN (CRESTOR) 5 MG TABLET    TAKE 1 TABLET BY MOUTH ONCE DAILY  Modified Medications   No medications on file  Discontinued Medications   No medications on file    Physical Exam:  There were no vitals filed for this visit. There is no height or weight on file to calculate BMI. Wt Readings from Last 3 Encounters:  04/01/18 209 lb (94.8 kg)  02/24/18 237 lb 14.4 oz (107.9 kg)  01/01/18 240 lb 6.4 oz (109 kg)     Labs reviewed: Basic Metabolic Panel: Recent Labs    02/23/18 0452 02/24/18 0542 02/25/18 0630 04/01/18 1559 04/02/18  NA 136 134* 134* 140 140  K 3.1* 3.3* 3.5 4.4 4.4  CL 98 97* 96* 101  --   CO2 26 30 29 30   --   GLUCOSE 90 115* 149* 117  --   BUN 24* 24* 24* 21 21  CREATININE 2.37* 2.21* 2.06* 1.68* 1.7*  CALCIUM 9.0 9.0 9.1 10.2  --   PHOS 3.2 2.2* 2.0*  --   --    Liver Function Tests: Recent Labs    12/24/17 1335  02/19/18 2257 02/23/18 0452 02/24/18 0542 02/25/18 0630 04/01/18 1559 04/02/18  AST 12  --  14*  --   --   --  12 12*  ALT 12  --  14  --   --   --  12 12  ALKPHOS  --   --  55  --   --   --    --  83  BILITOT 0.7  --  1.2  --   --   --  0.5  --   PROT 5.8*  --  5.6*  --   --   --  6.7  --   ALBUMIN  --    < > 3.1* 3.4* 3.0* 3.2*  --   --    < > =  values in this interval not displayed.   No results for input(s): LIPASE, AMYLASE in the last 8760 hours. No results for input(s): AMMONIA in the last 8760 hours. CBC: Recent Labs    02/23/18 0452 02/24/18 0542 04/01/18 1559 04/02/18  WBC 8.5 10.1 9.2 9.2  NEUTROABS 5.6 6.9 6,753  --   HGB 8.5* 8.5* 10.8* 10.8*  HCT 29.1* 29.5* 36.0* 36*  MCV 84.8 83.8 75.9*  --   PLT 190 197 354 354   Lipid Panel: Recent Labs    12/24/17 1335  CHOL 135  HDL 35*  LDLCALC 81  TRIG 95  CHOLHDL 3.9   TSH: No results for input(s): TSH in the last 8760 hours. A1C: Lab Results  Component Value Date   HGBA1C 6.4 04/02/2018     Assessment/Plan 1. Essential hypertension -blood pressure is still not controlled. He was previously on Olmesartan-amLODIPine-HCTZ prior to hospitalization and stated blood pressure was consistently controlled. Will have him stop amlodipine and restart Olmesartan-amLODIPine-HCTZ at this time.  -to continue DASH diet - Olmesartan-amLODIPine-HCTZ 20-5-12.5 MG TABS; Take 1 tablet by mouth daily.  Dispense: 30 tablet; Refill: 1  2. CKD stage 3  Encourage proper hydration and to avoid NSAIDS (Aleve, Advil, Motrin, Ibuprofen). He opted not to go to nephrologist. Will follow up labs in office at follow up in 1 week.   Next appt: 09/23/2018 for blood pressure follow up and lab.  Carlos American. Harle Battiest  [facility] [phone]    Virtual Visit via Telephone Note  I connected with pt on 09/13/18 at  2:15 PM EDT by telephone and verified that I am speaking with the correct person using two identifiers.  Location: Patient: home Provider: office    I discussed the limitations, risks, security and privacy concerns of performing an evaluation and management service by telephone and the  availability of in person appointments. I also discussed with the patient that there may be a patient responsible charge related to this service. The patient expressed understanding and agreed to proceed.   I discussed the assessment and treatment plan with the patient. The patient was provided an opportunity to ask questions and all were answered. The patient agreed with the plan and demonstrated an understanding of the instructions.   The patient was advised to call back or seek an in-person evaluation if the symptoms worsen or if the condition fails to improve as anticipated.  I provided 18 minutes of non-face-to-face time during this encounter.  Carlos American. Harle Battiest Avs printed and mailed

## 2018-09-13 NOTE — Patient Instructions (Signed)
Douglas Soto , Thank you for taking time to come for your Medicare Wellness Visit. I appreciate your ongoing commitment to your health goals. Please review the following plan we discussed and let me know if I can assist you in the future.   Screening recommendations/referrals: Colonoscopy up to date Recommended yearly ophthalmology/optometry visit for glaucoma screening and checkup Recommended yearly dental visit for hygiene and checkup  Vaccinations: Influenza vaccine due 11/2018 Pneumococcal vaccine up to date Tdap vaccine up to date Shingles vaccine: Rx Sent  To pharmcy   Advanced directives: please bring copy so we can place on file  Conditions/risks identified: none.   Next appointment: 1 year.   Preventive Care 71 Years and Older, Male Preventive care refers to lifestyle choices and visits with your health care provider that can promote health and wellness. What does preventive care include?  A yearly physical exam. This is also called an annual well check.  Dental exams once or twice a year.  Routine eye exams. Ask your health care provider how often you should have your eyes checked.  Personal lifestyle choices, including:  Daily care of your teeth and gums.  Regular physical activity.  Eating a healthy diet.  Avoiding tobacco and drug use.  Limiting alcohol use.  Practicing safe sex.  Taking low doses of aspirin every day.  Taking vitamin and mineral supplements as recommended by your health care provider. What happens during an annual well check? The services and screenings done by your health care provider during your annual well check will depend on your age, overall health, lifestyle risk factors, and family history of disease. Counseling  Your health care provider may ask you questions about your:  Alcohol use.  Tobacco use.  Drug use.  Emotional well-being.  Home and relationship well-being.  Sexual activity.  Eating habits.  History of  falls.  Memory and ability to understand (cognition).  Work and work Statistician. Screening  You may have the following tests or measurements:  Height, weight, and BMI.  Blood pressure.  Lipid and cholesterol levels. These may be checked every 5 years, or more frequently if you are over 9 years old.  Skin check.  Lung cancer screening. You may have this screening every year starting at age 107 if you have a 30-pack-year history of smoking and currently smoke or have quit within the past 15 years.  Fecal occult blood test (FOBT) of the stool. You may have this test every year starting at age 17.  Flexible sigmoidoscopy or colonoscopy. You may have a sigmoidoscopy every 5 years or a colonoscopy every 10 years starting at age 82.  Prostate cancer screening. Recommendations will vary depending on your family history and other risks.  Hepatitis C blood test.  Hepatitis B blood test.  Sexually transmitted disease (STD) testing.  Diabetes screening. This is done by checking your blood sugar (glucose) after you have not eaten for a while (fasting). You may have this done every 1-3 years.  Abdominal aortic aneurysm (AAA) screening. You may need this if you are a current or former smoker.  Osteoporosis. You may be screened starting at age 69 if you are at high risk. Talk with your health care provider about your test results, treatment options, and if necessary, the need for more tests. Vaccines  Your health care provider may recommend certain vaccines, such as:  Influenza vaccine. This is recommended every year.  Tetanus, diphtheria, and acellular pertussis (Tdap, Td) vaccine. You may need a Td booster  every 10 years.  Zoster vaccine. You may need this after age 62.  Pneumococcal 13-valent conjugate (PCV13) vaccine. One dose is recommended after age 4.  Pneumococcal polysaccharide (PPSV23) vaccine. One dose is recommended after age 70. Talk to your health care provider about  which screenings and vaccines you need and how often you need them. This information is not intended to replace advice given to you by your health care provider. Make sure you discuss any questions you have with your health care provider. Document Released: 04/30/2015 Document Revised: 12/22/2015 Document Reviewed: 02/02/2015 Elsevier Interactive Patient Education  2017 [place] Prevention in the Home Falls can cause injuries. They can happen to people of all ages. There are many things you can do to make your home safe and to help prevent falls. What can I do on the outside of my home?  Regularly fix the edges of walkways and driveways and fix any cracks.  Remove anything that might make you trip as you walk through a door, such as a raised step or threshold.  Trim any bushes or trees on the path to your home.  Use bright outdoor lighting.  Clear any walking paths of anything that might make someone trip, such as rocks or tools.  Regularly check to see if handrails are loose or broken. Make sure that both sides of any steps have handrails.  Any raised decks and porches should have guardrails on the edges.  Have any leaves, snow, or ice cleared regularly.  Use sand or salt on walking paths during winter.  Clean up any spills in your garage right away. This includes oil or grease spills. What can I do in the bathroom?  Use night lights.  Install grab bars by the toilet and in the tub and shower. Do not use towel bars as grab bars.  Use non-skid mats or decals in the tub or shower.  If you need to sit down in the shower, use a plastic, non-slip stool.  Keep the floor dry. Clean up any water that spills on the floor as soon as it happens.  Remove soap buildup in the tub or shower regularly.  Attach bath mats securely with double-sided non-slip rug tape.  Do not have throw rugs and other things on the floor that can make you trip. What can I do in the bedroom?   Use night lights.  Make sure that you have a light by your bed that is easy to reach.  Do not use any sheets or blankets that are too big for your bed. They should not hang down onto the floor.  Have a firm chair that has side arms. You can use this for support while you get dressed.  Do not have throw rugs and other things on the floor that can make you trip. What can I do in the kitchen?  Clean up any spills right away.  Avoid walking on wet floors.  Keep items that you use a lot in easy-to-reach places.  If you need to reach something above you, use a strong step stool that has a grab bar.  Keep electrical cords out of the way.  Do not use floor polish or wax that makes floors slippery. If you must use wax, use non-skid floor wax.  Do not have throw rugs and other things on the floor that can make you trip. What can I do with my stairs?  Do not leave any items on the stairs.  Make  sure that there are handrails on both sides of the stairs and use them. Fix handrails that are broken or loose. Make sure that handrails are as long as the stairways.  Check any carpeting to make sure that it is firmly attached to the stairs. Fix any carpet that is loose or worn.  Avoid having throw rugs at the top or bottom of the stairs. If you do have throw rugs, attach them to the floor with carpet tape.  Make sure that you have a light switch at the top of the stairs and the bottom of the stairs. If you do not have them, ask someone to add them for you. What else can I do to help prevent falls?  Wear shoes that:  Do not have high heels.  Have rubber bottoms.  Are comfortable and fit you well.  Are closed at the toe. Do not wear sandals.  If you use a stepladder:  Make sure that it is fully opened. Do not climb a closed stepladder.  Make sure that both sides of the stepladder are locked into place.  Ask someone to hold it for you, if possible.  Clearly mark and make sure that  you can see:  Any grab bars or handrails.  First and last steps.  Where the edge of each step is.  Use tools that help you move around (mobility aids) if they are needed. These include:  Canes.  Walkers.  Scooters.  Crutches.  Turn on the lights when you go into a dark area. Replace any light bulbs as soon as they burn out.  Set up your furniture so you have a clear path. Avoid moving your furniture around.  If any of your floors are uneven, fix them.  If there are any pets around you, be aware of where they are.  Review your medicines with your doctor. Some medicines can make you feel dizzy. This can increase your chance of falling. Ask your doctor what other things that you can do to help prevent falls. This information is not intended to replace advice given to you by your health care provider. Make sure you discuss any questions you have with your health care provider. Document Released: 01/28/2009 Document Revised: 09/09/2015 Document Reviewed: 05/08/2014 Elsevier Interactive Patient Education  2017 [employer].

## 2018-09-13 NOTE — Progress Notes (Signed)
    This service is provided via telemedicine  No vital signs collected/recorded due to the encounter was a telemedicine visit.   Location of patient (ex: home, work):  Home  Patient consents to a telephone visit:  Yes  Location of the provider (ex: office, home):  [facility], Office   Name of any referring provider: N/A  Names of all persons participating in the telemedicine service and their role in the encounter:  S.Chrae B/CMA, Jessica Eubanks, NP, and Patient   Time spent on call: 10 min with medical assistant   

## 2018-09-13 NOTE — Progress Notes (Signed)
Subjective:   Douglas Soto is a 78 y.o. male who presents for Medicare Annual/Subsequent preventive examination.  Review of Systems:   Cardiac Risk Factors include: advanced age (>82men, >70 women);obesity (BMI >30kg/m2);hypertension;male gender;dyslipidemia     Objective:    Vitals: There were no vitals taken for this visit.  There is no height or weight on file to calculate BMI.  Advanced Directives 09/13/2018 08/20/2018 02/19/2018 02/19/2018 01/01/2018 05/22/2017 02/19/2017  Does Patient Have a Medical Advance Directive? Yes No Yes No;Yes No Yes Yes  Type of Advance Directive Living will;Healthcare Power of Attorney - Living will Living will - [place];Living will [place];Living will  Does patient want to make changes to medical advance directive? - No - Patient declined No - Patient declined - - - No - Patient declined  Copy of [place] in Chart? No - copy requested - - - - No - copy requested No - copy requested  Would patient like information on creating a medical advance directive? - - No - Patient declined No - Patient declined - - -    Tobacco Social History   Tobacco Use  Smoking Status Former Smoker  . Last attempt to quit: 04/17/1972  . Years since quitting: 46.4  Smokeless Tobacco Current User  . Types: Chew     Ready to quit: Not Answered Counseling given: Not Answered   Clinical Intake:  Pre-visit preparation completed: Yes  Pain : 0-10 Pain Score: 4  Pain Type: Chronic pain Pain Location: Back Pain Orientation: Lower Pain Descriptors / Indicators: Aching, Burning Pain Onset: More than a month ago Pain Frequency: Intermittent Pain Relieving Factors: pain medication Effect of Pain on Daily Activities: pain makes it hard to move  Pain Relieving Factors: pain medication  BMI - recorded: 33.1 Nutritional Status: BMI > 30  Obese Nutritional Risks: None Diabetes: Yes CBG done?: No Did pt. bring in  CBG monitor from home?: No  How often do you need to have someone help you when you read instructions, pamphlets, or other written materials from your doctor or pharmacy?: 1 - Never What is the last grade level you completed in school?: 12th grade, some college  Interpreter Needed?: No     Past Medical History:  Diagnosis Date  . Cholelithiasis 66/5993   uncomplicated.    . Chronic kidney disease, stage II (mild)   . Depression   . Diabetes mellitus without complication ([place])   . Essential hypertension, malignant   . Fatty liver 02/2018   on ultrasound  . Gout, unspecified   . Hyperlipidemia   . Lumbago   . Obesity, unspecified   . Pain in joint, pelvic region and thigh   . Pleural effusion on right 02/2018   Past Surgical History:  Procedure Laterality Date  . APPENDECTOMY    . COLONOSCOPY WITH PROPOFOL N/A 02/22/2018   Procedure: COLONOSCOPY WITH PROPOFOL;  Surgeon: Milus Banister, MD;  Location: [facility] ENDOSCOPY;  Service: Endoscopy;  Laterality: N/A;  . CYST EXCISION  1998   back  . ESOPHAGOGASTRODUODENOSCOPY (EGD) WITH PROPOFOL N/A 02/22/2018   Procedure: ESOPHAGOGASTRODUODENOSCOPY (EGD) WITH PROPOFOL;  Surgeon: Milus Banister, MD;  Location: [facility] ENDOSCOPY;  Service: Endoscopy;  Laterality: N/A;  . POLYPECTOMY  02/22/2018   Procedure: POLYPECTOMY;  Surgeon: Milus Banister, MD;  Location: [facility] ENDOSCOPY;  Service: Endoscopy;;  . 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 Heart Problems Father   . Heart disease Brother   . Diabetes Brother   . Diabetes Brother   . Heart Problems Mother   . Pneumonia Maternal Grandfather    Social History   Socioeconomic History  . Marital status: Widowed    Spouse name: Not on file  . Number of children: Not on file  . Years of education: Not on file  . Highest education level: Not on file  Occupational History  . Not on file  Social Needs  . Financial resource  strain: Not on file  . Food insecurity:    Worry: Not on file    Inability: Not on file  . Transportation needs:    Medical: Not on file    Non-medical: Not on file  Tobacco Use  . Smoking status: Former Smoker    Last attempt to quit: 04/17/1972    Years since quitting: 46.4  . Smokeless tobacco: Current User    Types: Chew  Substance and Sexual Activity  . Alcohol use: Not Currently    Alcohol/week: 5.0 standard drinks    Types: 5 Cans of beer per week    Comment: Stopped 01/2018   . Drug use: No  . Sexual activity: Not Currently  Lifestyle  . Physical activity:    Days per week: Not on file    Minutes per session: Not on file  . Stress: Not on file  Relationships  . Social connections:    Talks on phone: Not on file    Gets together: Not on file    Attends religious service: Not on file    Active member of club or organization: Not on file    Attends meetings of clubs or organizations: Not on file    Relationship status: Not on file  Other Topics Concern  . Not on file  Social History Narrative   Widow   Former smoker stopped 1974, chews tobacco   Alcohol beer on weekends   Exercise  Walks dog       Outpatient Encounter Medications as of 09/13/2018  Medication Sig  . amLODipine (NORVASC) 10 MG tablet Take 1 tablet (10 mg total) by mouth daily.  Marland Kitchen apixaban (ELIQUIS) 2.5 MG TABS tablet Take 1 tablet (2.5 mg total) by mouth 2 (two) times daily.  Marland Kitchen doxazosin (CARDURA) 8 MG tablet Take 1 tablet (8 mg total) by mouth daily.  . ferrous sulfate 325 (65 FE) MG tablet Take 1 tablet (325 mg total) by mouth 2 (two) times daily with a meal.  . furosemide (LASIX) 20 MG tablet Take 1 tablet (20 mg total) by mouth every other day.  Marland Kitchen HYDROcodone-acetaminophen (NORCO) 10-325 MG tablet Take 1 tablet by mouth every 6 (six) hours as needed (pain).  Marland Kitchen JARDIANCE 25 MG TABS tablet TAKE 1 TABLET BY MOUTH EVERY MORNING TO CONTROL DIABETES  . metFORMIN (GLUCOPHAGE) 1000 MG tablet TAKE 1  TABLET BY MOUTH TWICE DAILY WITH MEALS  . metoprolol succinate (TOPROL-XL) 50 MG 24 hr tablet Take 1 tablet (50 mg total) by mouth daily. Take with or immediately following a meal.  . multivitamin-iron-minerals-folic acid (CENTRUM) chewable tablet Chew 1 tablet by mouth daily.  . rosuvastatin (CRESTOR) 5 MG tablet TAKE 1 TABLET BY MOUTH ONCE DAILY   No facility-administered encounter medications on file as of 09/13/2018.     Activities of Daily Living In your present state of health, do you have any difficulty performing the following activities: 09/13/2018 02/19/2018  Hearing?  N N  Vision? N N  Difficulty concentrating or making decisions? N N  Walking or climbing stairs? N N  Dressing or bathing? N Y  Doing errands, shopping? N N  Preparing Food and eating ? N -  Using the Toilet? N -  In the past six months, have you accidently leaked urine? N -  Do you have problems with loss of bowel control? N -  Managing your Medications? N -  Managing your Finances? N -  Housekeeping or managing your Housekeeping? N -  Some recent data might be hidden    Patient Care Team: Lauree Chandler, NP as PCP - General (Geriatric Medicine)   Assessment:   This is a routine wellness examination for [place].  Exercise Activities and Dietary recommendations Current Exercise Habits: The patient does not participate in regular exercise at present, Exercise limited by: None identified  Goals    . DIET - EAT MORE FRUITS AND VEGETABLES     Overall would like to eat better, more fresh fruits and vegetables.        Fall Risk Fall Risk  09/13/2018 08/30/2018 08/20/2018 04/01/2018 01/01/2018  Falls in the past year? 0 0 0 0 No  Number falls in past yr: 0 0 0 0 -  Injury with Fall? 0 0 0 0 -   Is the patient's home free of loose throw rugs in walkways, pet beds, electrical cords, etc?   yes      Grab bars in the bathroom? no      Handrails on the stairs?   no stairs into house, stairs outside with rails.        Adequate lighting?   yes  Timed Get Up and Go Performed: na  Depression Screen PHQ 2/9 Scores 09/13/2018 08/30/2018 08/20/2018 01/01/2018  PHQ - 2 Score 0 0 0 0    Cognitive Function MMSE - Mini Mental State Exam 12/01/2015  Not completed: Refused     6CIT Screen 09/13/2018  What Year? 4 points  What month? 0 points  What time? 0 points  Count back from 20 0 points  Months in reverse 4 points  Repeat phrase 4 points  Total Score 12    Immunization History  Administered Date(s) Administered  . Influenza-Unspecified 02/16/2010  . Pneumococcal Conjugate-13 11/16/2016  . Pneumococcal-Unspecified 04/17/2008  . Td 06/16/2002, 04/01/2018  . Zoster 11/07/2011    Qualifies for Shingles Vaccine? Yes   Screening Tests Health Maintenance  Topic Date Due  . OPHTHALMOLOGY EXAM  01/24/1951  . HEMOGLOBIN A1C  10/02/2018  . INFLUENZA VACCINE  11/16/2018  . FOOT EXAM  01/02/2019  . TETANUS/TDAP  04/01/2028  . PNA vac Low Risk Adult  Completed   Cancer Screenings: Lung: Low Dose CT Chest recommended if Age 71-80 years, 30 pack-year currently smoking OR have quit w/in 15years. Patient does not qualify. Colorectal: up to date  Additional Screenings:  Hepatitis C Screening: na       Plan:     I have personally reviewed and noted the following in the patient's chart:   . Medical and social history . Use of alcohol, tobacco or illicit drugs  . Current medications and supplements . Functional ability and status . Nutritional status . Physical activity . Advanced directives . List of other physicians . Hospitalizations, surgeries, and ER visits in previous 12 months . Vitals . Screenings to include cognitive, depression, and falls . Referrals and appointments  In addition, I have reviewed and discussed with  patient certain preventive protocols, quality metrics, and best practice recommendations. A written personalized care plan for preventive services as well as general  preventive health recommendations were provided to patient.     Lauree Chandler, NP  09/13/2018

## 2018-09-13 NOTE — Telephone Encounter (Signed)
Patient requested refill [place] Database Verified LR: 08/15/2018 Pended Rx and sent to [facility] for approval.

## 2018-09-16 ENCOUNTER — Other Ambulatory Visit: Payer: Self-pay | Admitting: Nurse Practitioner

## 2018-09-16 DIAGNOSIS — I10 Essential (primary) hypertension: Secondary | ICD-10-CM

## 2018-09-16 MED ORDER — HYDROCODONE-ACETAMINOPHEN 10-325 MG PO TABS: 1.0000 | ORAL_TABLET | Freq: Four times a day (QID) | ORAL | 0 refills | Status: DC | PRN

## 2018-09-19 ENCOUNTER — Other Ambulatory Visit: Payer: Self-pay | Admitting: Nurse Practitioner

## 2018-09-19 ENCOUNTER — Other Ambulatory Visit: Payer: Self-pay

## 2018-09-19 DIAGNOSIS — E7800 Pure hypercholesterolemia, unspecified: Secondary | ICD-10-CM

## 2018-09-19 NOTE — Patient Outreach (Signed)
[facility]) Care Management  09/19/2018  CLEVER GERALDO [birth year] [id number]   Medication Adherence call to Mr. Phat Dalton Hippa Identifiers Verify spoke with patient he is past due on Rosuvastatin 5 mg patient explain he is taking 1 tablet daily and will order from [place] patient is showing past due under [place].   [place] Management Direct Dial [phone]  Fax [phone] [email or web address]

## 2018-09-23 ENCOUNTER — Telehealth: Payer: Self-pay | Admitting: *Deleted

## 2018-09-23 ENCOUNTER — Ambulatory Visit: Payer: Medicare Other | Admitting: Nurse Practitioner

## 2018-09-23 NOTE — Telephone Encounter (Signed)
Patient called and just wanted to let you know he checked his blood pressure today and it was 135/72 and pulse 72. Checked it at 1:20pm today.

## 2018-09-24 NOTE — Telephone Encounter (Signed)
Noted, thank you

## 2018-10-01 ENCOUNTER — Other Ambulatory Visit: Payer: Self-pay | Admitting: Nurse Practitioner

## 2018-10-01 DIAGNOSIS — IMO0002 Reserved for concepts with insufficient information to code with codable children: Secondary | ICD-10-CM

## 2018-10-01 DIAGNOSIS — E1122 Type 2 diabetes mellitus with diabetic chronic kidney disease: Secondary | ICD-10-CM

## 2018-10-08 ENCOUNTER — Other Ambulatory Visit: Payer: Self-pay | Admitting: Nurse Practitioner

## 2018-10-12 ENCOUNTER — Other Ambulatory Visit: Payer: Self-pay | Admitting: Nurse Practitioner

## 2018-10-16 ENCOUNTER — Other Ambulatory Visit: Payer: Self-pay | Admitting: *Deleted

## 2018-10-16 DIAGNOSIS — G8929 Other chronic pain: Secondary | ICD-10-CM

## 2018-10-16 DIAGNOSIS — M545 Low back pain, unspecified: Secondary | ICD-10-CM

## 2018-10-16 MED ORDER — HYDROCODONE-ACETAMINOPHEN 10-325 MG PO TABS: 1.0000 | ORAL_TABLET | Freq: Four times a day (QID) | ORAL | 0 refills | Status: DC | PRN

## 2018-10-16 NOTE — Telephone Encounter (Signed)
Patient requested refill Epic LR 09/16/2018  Pended Rx and sent to [facility] for approval.

## 2018-11-14 ENCOUNTER — Other Ambulatory Visit: Payer: Self-pay

## 2018-11-14 DIAGNOSIS — G8929 Other chronic pain: Secondary | ICD-10-CM

## 2018-11-14 MED ORDER — HYDROCODONE-ACETAMINOPHEN 10-325 MG PO TABS: 1.0000 | ORAL_TABLET | Freq: Four times a day (QID) | ORAL | 0 refills | Status: DC | PRN

## 2018-11-14 NOTE — Telephone Encounter (Signed)
Last office visit 09/13/2018 Last refill 10/16/2018 #120 Routed to provider for approval

## 2018-12-13 ENCOUNTER — Other Ambulatory Visit: Payer: Self-pay | Admitting: *Deleted

## 2018-12-13 DIAGNOSIS — G8929 Other chronic pain: Secondary | ICD-10-CM

## 2018-12-13 MED ORDER — HYDROCODONE-ACETAMINOPHEN 10-325 MG PO TABS: 1.0000 | ORAL_TABLET | Freq: Four times a day (QID) | ORAL | 0 refills | Status: DC | PRN

## 2018-12-13 NOTE — Telephone Encounter (Signed)
Patient requested refill Epic LR: 11/14/2018 Pended Rx and sent to [facility] for approval.

## 2018-12-13 NOTE — Telephone Encounter (Signed)
Please let pt know he needs an in office follow up for additional refills, he is over due for lab work

## 2018-12-14 ENCOUNTER — Other Ambulatory Visit: Payer: Self-pay | Admitting: Nurse Practitioner

## 2018-12-14 DIAGNOSIS — E0822 Diabetes mellitus due to underlying condition with diabetic chronic kidney disease: Secondary | ICD-10-CM

## 2018-12-14 DIAGNOSIS — IMO0002 Reserved for concepts with insufficient information to code with codable children: Secondary | ICD-10-CM

## 2018-12-17 ENCOUNTER — Other Ambulatory Visit: Payer: Self-pay | Admitting: Nurse Practitioner

## 2018-12-17 DIAGNOSIS — I10 Essential (primary) hypertension: Secondary | ICD-10-CM

## 2018-12-18 ENCOUNTER — Other Ambulatory Visit: Payer: Self-pay

## 2018-12-18 NOTE — Patient Outreach (Signed)
[facility]) Care Management  12/18/2018  JEET HEARING 05/18/1940 [id number]   Medication Adherence call to Mr. Douglas Soto Hippa Identifiers Verify spoke with patient he Soto past due on Olmesartan/Amlodipine/Hctz patient explain he takes 1 tablet daily and has three more tablets and [facility] has it ready for him to pick up.Mr. Douglas Soto showing past due under [place].   [place] Management Direct Dial [phone]  Fax [phone] [email or web address]

## 2019-01-03 ENCOUNTER — Encounter: Payer: Self-pay | Admitting: Nurse Practitioner

## 2019-01-03 ENCOUNTER — Ambulatory Visit (INDEPENDENT_AMBULATORY_CARE_PROVIDER_SITE_OTHER): Payer: Medicare Other | Admitting: Nurse Practitioner

## 2019-01-03 ENCOUNTER — Other Ambulatory Visit: Payer: Self-pay

## 2019-01-03 VITALS — BP 144/82 | HR 94 | Temp 98.6°F | Ht 68.0 in | Wt 216.0 lb

## 2019-01-03 DIAGNOSIS — D649 Anemia, unspecified: Secondary | ICD-10-CM

## 2019-01-03 DIAGNOSIS — N183 Chronic kidney disease, stage 3 unspecified: Secondary | ICD-10-CM

## 2019-01-03 DIAGNOSIS — I10 Essential (primary) hypertension: Secondary | ICD-10-CM

## 2019-01-03 DIAGNOSIS — K5903 Drug induced constipation: Secondary | ICD-10-CM

## 2019-01-03 DIAGNOSIS — Z23 Encounter for immunization: Secondary | ICD-10-CM

## 2019-01-03 DIAGNOSIS — E1169 Type 2 diabetes mellitus with other specified complication: Secondary | ICD-10-CM | POA: Diagnosis not present

## 2019-01-03 DIAGNOSIS — I4821 Permanent atrial fibrillation: Secondary | ICD-10-CM

## 2019-01-03 DIAGNOSIS — E785 Hyperlipidemia, unspecified: Secondary | ICD-10-CM

## 2019-01-03 DIAGNOSIS — G8929 Other chronic pain: Secondary | ICD-10-CM

## 2019-01-03 DIAGNOSIS — M545 Low back pain: Secondary | ICD-10-CM

## 2019-01-03 NOTE — Patient Instructions (Signed)
For diabetic eye exam.  [facility] Address: [address] [place], [place] [address] Phone: [phone]  [facility] -  Address: [place], [place], [place] [address] Phone: [phone]   Blood pressure goal <140/90  To stop lasix but monitor for increase swelling in legs

## 2019-01-03 NOTE — Progress Notes (Signed)
Careteam: Patient Care Team: Lauree Chandler, NP as PCP - General (Geriatric Medicine)  Advanced Directive information Does Patient Have a Medical Advance Directive?: Yes, Type of Advance Directive: [place], Does patient want to make changes to medical advance directive?: No - Patient declined  Allergies  Allergen Reactions  . Allopurinol Other (See Comments)    Whelps on body and sores in mouth     Chief Complaint  Patient presents with  . Follow-up    Medication follow-up  . Medication Management    Clarify how many times patient to take iron supplement. Patient would also like to review medications and see if he can d/c any of them.   . Quality Metric Gaps    Discuss need for eye exam and A1c  . Immunizations    Refused flu vaccine     HPI: Patient is a 78 y.o. male seen in the office today for routine follow up. Did not come to get lab work.  htn- taking cardura, lasix 20 mg every other day, metoprolol 50 mg daily, olmesartan-amlodipine- HCTZ daily. Reports blood pressure much better at home than here in office.  Blood pressure this morning 136/70.    DM- Taking metformin 1000 mg by mouth twice daily with meals, jardiance 25 mg daily. A1c 6.4 in December 2019. Has not made much dietary modifications recently  A fib- taking eliquis, no palpitations. Rate controled. No palpations, chest pains or edema  Anemia- stable, no signs of bleeding. iron daily. Occasional constipation.   Constipation- occasionally a problem, has not tried stool softener.   Low back pain- continues on hydrocodone-apap which controls pain. If he did not have this he would not be able to get up in the morning. Occasional constipation.   Had been losing weight but states it has been too hot and he is sitting on his couch watching the news.   hyperlipidemia continues on crestor 5 mg daily   Review of Systems:  Review of Systems  Constitutional: Negative for chills,  fever and weight loss.  HENT: Negative for tinnitus.   Respiratory: Negative for cough, sputum production and shortness of breath.   Cardiovascular: Negative for chest pain, palpitations and leg swelling.  Gastrointestinal: Negative for abdominal pain, constipation and diarrhea.  Skin: Negative.   Neurological: Negative for dizziness and headaches.  Psychiatric/Behavioral: Negative for depression and memory loss. The patient does not have insomnia.     Past Medical History:  Diagnosis Date  . Cholelithiasis [id number]   uncomplicated.    . Chronic kidney disease, stage II (mild)   . Depression   . Diabetes mellitus without complication ([employer])   . Essential hypertension, malignant   . Fatty liver 02/2018   on ultrasound  . Gout, unspecified   . Hyperlipidemia   . Lumbago   . Obesity, unspecified   . Pain in joint, pelvic region and thigh   . Pleural effusion on right 02/2018   Past Surgical History:  Procedure Laterality Date  . APPENDECTOMY    . COLONOSCOPY WITH PROPOFOL N/A 02/22/2018   Procedure: COLONOSCOPY WITH PROPOFOL;  Surgeon: Milus Banister, MD;  Location: [facility] ENDOSCOPY;  Service: Endoscopy;  Laterality: N/A;  . CYST EXCISION  1998   back  . ESOPHAGOGASTRODUODENOSCOPY (EGD) WITH PROPOFOL N/A 02/22/2018   Procedure: ESOPHAGOGASTRODUODENOSCOPY (EGD) WITH PROPOFOL;  Surgeon: Milus Banister, MD;  Location: [facility] ENDOSCOPY;  Service: Endoscopy;  Laterality: N/A;  . POLYPECTOMY  02/22/2018   Procedure: POLYPECTOMY;  Surgeon: Milus Banister, MD;  Location: [facility] ENDOSCOPY;  Service: Endoscopy;;  . 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 His smokeless tobacco use includes chew. He reports previous alcohol use of about 5.0 standard drinks of alcohol per week. He reports that he does not use drugs.  Family History  Problem Relation Age of Onset  . Heart Problems Father   . Heart disease Brother    . Diabetes Brother   . Diabetes Brother   . Heart Problems Mother   . Pneumonia Maternal Grandfather     Medications: Patient's Medications  New Prescriptions   No medications on file  Previous Medications   DOXAZOSIN (CARDURA) 8 MG TABLET    Take 1 tablet (8 mg total) by mouth daily.   ELIQUIS 2.5 MG TABS TABLET    TAKE 1 TABLET(2.5 MG) BY MOUTH TWICE DAILY   FERROUS SULFATE 325 (65 FE) MG TABLET    Take 325 mg by mouth daily with breakfast.    FUROSEMIDE (LASIX) 20 MG TABLET    TAKE 1 TABLET(20 MG) BY MOUTH EVERY OTHER DAY   HYDROCODONE-ACETAMINOPHEN (NORCO) 10-325 MG TABLET    Take 1 tablet by mouth every 6 (six) hours as needed (pain).   JARDIANCE 25 MG TABS TABLET    TAKE 1 TABLET BY MOUTH EVERY MORNING FOR DIABETES   METFORMIN (GLUCOPHAGE) 1000 MG TABLET    Take 1 tablet (1,000 mg total) by mouth 2 (two) times daily with a meal. E11.22, E11.65, N18.2   METOPROLOL SUCCINATE (TOPROL-XL) 50 MG 24 HR TABLET    Take 1 tablet (50 mg total) by mouth daily. Take with or immediately following a meal.   MULTIVITAMIN-IRON-MINERALS-FOLIC ACID (CENTRUM) CHEWABLE TABLET    Chew 1 tablet by mouth daily.   OLMESARTAN-AMLODIPINE-HCTZ 20-5-12.5 MG TABS    TAKE 1 TABLET BY MOUTH DAILY   ROSUVASTATIN (CRESTOR) 5 MG TABLET    TAKE 1 TABLET BY MOUTH EVERY DAY  Modified Medications   No medications on file  Discontinued Medications   No medications on file    Physical Exam:  Vitals:   01/03/19 1329  BP: (!) 144/82  Pulse: 94  Temp: 98.6 F (37 C)  TempSrc: Temporal  SpO2: 97%  Weight: 216 lb (98 kg)  Height: 5\' 8"  (1.727 m)   Body mass index is 32.84 kg/m. Wt Readings from Last 3 Encounters:  01/03/19 216 lb (98 kg)  04/01/18 209 lb (94.8 kg)  02/24/18 237 lb 14.4 oz (107.9 kg)    Physical Exam Constitutional:      General: He is not in acute distress.    Appearance: He is well-developed.  HENT:     Head: Normocephalic and atraumatic.  Eyes:     Conjunctiva/sclera:  Conjunctivae normal.     Pupils: Pupils are equal, round, and reactive to light.  Neck:     Musculoskeletal: Normal range of motion and neck supple.  Cardiovascular:     Rate and Rhythm: Normal rate. Rhythm irregularly irregular.     Pulses:          Dorsalis pedis pulses are 1+ on the right side and 1+ on the left side.       Posterior tibial pulses are 1+ on the right side and 1+ on the left side.     Heart sounds: Normal heart sounds. No murmur. No friction rub. No gallop.   Pulmonary:  Effort: Pulmonary effort is normal.     Breath sounds: Normal breath sounds.  Abdominal:     General: Bowel sounds are normal. There is no distension.     Palpations: Abdomen is soft.     Tenderness: There is no abdominal tenderness.  Musculoskeletal: Normal range of motion.        General: No tenderness.  Feet:     Right foot:     Protective Sensation: 3 sites tested. 3 sites sensed.     Skin integrity: Skin integrity normal.     Left foot:     Protective Sensation: 3 sites tested. 3 sites sensed.     Skin integrity: Skin integrity normal.  Skin:    General: Skin is warm and dry.  Neurological:     Mental Status: He is alert and oriented to person, place, and time.     Coordination: Coordination normal.     Deep Tendon Reflexes: Reflexes are normal and symmetric.  Psychiatric:        Behavior: Behavior normal.        Thought Content: Thought content normal.        Judgment: Judgment normal.     Labs reviewed: Basic Metabolic Panel: Recent Labs    02/23/18 0452 02/24/18 0542 02/25/18 0630 04/01/18 1559 04/02/18  NA 136 134* 134* 140 140  K 3.1* 3.3* 3.5 4.4 4.4  CL 98 97* 96* 101  --   CO2 26 30 29 30   --   GLUCOSE 90 115* 149* 117  --   BUN 24* 24* 24* 21 21  CREATININE 2.37* 2.21* 2.06* 1.68* 1.7*  CALCIUM 9.0 9.0 9.1 10.2  --   PHOS 3.2 2.2* 2.0*  --   --    Liver Function Tests: Recent Labs    02/19/18 2257 02/23/18 0452 02/24/18 0542 02/25/18 0630 04/01/18  1559 04/02/18  AST 14*  --   --   --  12 12*  ALT 14  --   --   --  12 12  ALKPHOS 55  --   --   --   --  83  BILITOT 1.2  --   --   --  0.5  --   PROT 5.6*  --   --   --  6.7  --   ALBUMIN 3.1* 3.4* 3.0* 3.2*  --   --    No results for input(s): LIPASE, AMYLASE in the last 8760 hours. No results for input(s): AMMONIA in the last 8760 hours. CBC: Recent Labs    02/23/18 0452 02/24/18 0542 04/01/18 1559 04/02/18  WBC 8.5 10.1 9.2 9.2  NEUTROABS 5.6 6.9 6,753  --   HGB 8.5* 8.5* 10.8* 10.8*  HCT 29.1* 29.5* 36.0* 36*  MCV 84.8 83.8 75.9*  --   PLT 190 197 354 354   Lipid Panel: No results for input(s): CHOL, HDL, LDLCALC, TRIG, CHOLHDL, LDLDIRECT in the last 8760 hours. TSH: No results for input(s): TSH in the last 8760 hours. A1C: Lab Results  Component Value Date   HGBA1C 6.4 04/02/2018     Assessment/Plan 1. Need for influenza vaccination - Flu Vaccine QUAD High Dose(Fluad)  2. Essential hypertension Slightly elevated in office but he has white coat syndrome. Reports much lower at home. Aware goal is <140/90. Continue dietary modification and doxazosin, toprol, olmesartan-amlodipine- hctz. Will dc lasix since he is on HCTZ. To continue to monitor bp and LE edema.   3. CKD (chronic kidney disease) stage 3, GFR  30-59 ml/min (HCC) Encourage proper hydration and to avoid NSAIDS (Aleve, Advil, Motrin, Ibuprofen)  - COMPLETE METABOLIC PANEL WITH GFR  4. Permanent atrial fibrillation Rate controlled, continues on eliquis for anticoagulation and metoprolol for rate control   5. Anemia, unspecified type -taking iron supplement daily  - CBC with Differential/Platelet  6. Chronic midline low back pain without sciatica Stable on hydrocodone-apap. significantly helps his quality of life.  7. Drug-induced constipation Controlled with lifestyle modifications  8. Type 2 diabetes mellitus with other specified complication, without long-term current use of insulin (HCC)  Does not have readings today.  Continues on metformin BID and  jardiance daily. Declines ophthalmology appt despite education.  - discussed with the patient the pathophysiology of diabetes and the natural progression of the disease.  -stressed the importance of lifestyle changes including diet and exercise. -discussed complications associated with diabetes including retinopathy, nephropathy, neuropathy as well as increased risk of cardiovascular disease. We went over the benefit seen with glycemic control.  -Encouraged dietary compliance, routine foot care/monitoring and to keep up with diabetic eye exams through ophthalmology  - hemoglobin A1c  9. Hyperlipidemia LDL goal <70 Continues on crestor 5 mg daily, ?complaince based on outreach note.  - Lipid Panel - COMPLETE METABOLIC PANEL WITH GFR  Next appt: 6 month follow up  [place]. Bud, [facility] [phone]

## 2019-01-04 LAB — COMPLETE METABOLIC PANEL WITH GFR
AG Ratio: 1.8 (calc) (ref 1.0–2.5)
ALT: 11 U/L (ref 9–46)
AST: 13 U/L (ref 10–35)
Albumin: 4.2 g/dL (ref 3.6–5.1)
Alkaline phosphatase (APISO): 71 U/L (ref 35–144)
BUN/Creatinine Ratio: 15 (calc) (ref 6–22)
BUN: 24 mg/dL (ref 7–25)
CO2: 33 mmol/L — ABNORMAL HIGH (ref 20–32)
Calcium: 10.1 mg/dL (ref 8.6–10.3)
Chloride: 98 mmol/L (ref 98–110)
Creat: 1.62 mg/dL — ABNORMAL HIGH (ref 0.70–1.18)
GFR, Est African American: 47 mL/min/{1.73_m2} — ABNORMAL LOW (ref >=60)
GFR, Est Non African American: 41 mL/min/{1.73_m2} — ABNORMAL LOW (ref >=60)
Globulin: 2.4 g/dL (calc) (ref 1.9–3.7)
Glucose, Bld: 146 mg/dL — ABNORMAL HIGH (ref 65–139)
Potassium: 4.3 mmol/L (ref 3.5–5.3)
Sodium: 140 mmol/L (ref 135–146)
Total Bilirubin: 0.9 mg/dL (ref 0.2–1.2)
Total Protein: 6.6 g/dL (ref 6.1–8.1)

## 2019-01-04 LAB — CBC WITH DIFFERENTIAL/PLATELET
Absolute Monocytes: 773 cells/uL (ref 200–950)
Basophils Absolute: 50 cells/uL (ref 0–200)
Basophils Relative: 0.6 %
Eosinophils Absolute: 328 cells/uL (ref 15–500)
Eosinophils Relative: 3.9 %
HCT: 42.5 % (ref 38.5–50.0)
Hemoglobin: 14.1 g/dL (ref 13.2–17.1)
Lymphs Abs: 1554 cells/uL (ref 850–3900)
MCH: 29.4 pg (ref 27.0–33.0)
MCHC: 33.2 g/dL (ref 32.0–36.0)
MCV: 88.5 fL (ref 80.0–100.0)
MPV: 9.9 fL (ref 7.5–12.5)
Monocytes Relative: 9.2 %
Neutro Abs: 5695 cells/uL (ref 1500–7800)
Neutrophils Relative %: 67.8 %
Platelets: 218 10*3/uL (ref 140–400)
RBC: 4.8 10*6/uL (ref 4.20–5.80)
RDW: 14 % (ref 11.0–15.0)
Total Lymphocyte: 18.5 %
WBC: 8.4 10*3/uL (ref 3.8–10.8)

## 2019-01-04 LAB — HEMOGLOBIN A1C
Hgb A1c MFr Bld: 6.7 % of total Hgb — ABNORMAL HIGH (ref <5.7)
Mean Plasma Glucose: 146 (calc)
eAG (mmol/L): 8.1 (calc)

## 2019-01-04 LAB — LIPID PANEL
Cholesterol: 125 mg/dL (ref <200)
HDL: 52 mg/dL (ref >=40)
LDL Cholesterol (Calc): 53 mg/dL (calc)
Non-HDL Cholesterol (Calc): 73 mg/dL (calc) (ref <130)
Total CHOL/HDL Ratio: 2.4 (calc) (ref <5.0)
Triglycerides: 116 mg/dL (ref <150)

## 2019-01-16 ENCOUNTER — Other Ambulatory Visit: Payer: Self-pay | Admitting: *Deleted

## 2019-01-16 DIAGNOSIS — G8929 Other chronic pain: Secondary | ICD-10-CM

## 2019-01-16 DIAGNOSIS — M545 Low back pain, unspecified: Secondary | ICD-10-CM

## 2019-01-16 MED ORDER — HYDROCODONE-ACETAMINOPHEN 10-325 MG PO TABS: 1.0000 | ORAL_TABLET | Freq: Four times a day (QID) | ORAL | 0 refills | Status: DC | PRN

## 2019-01-16 NOTE — Telephone Encounter (Signed)
Patient requested refill  Epic LR: 12/13/18 Pended Rx and sent to [facility] for approval.

## 2019-01-17 ENCOUNTER — Other Ambulatory Visit: Payer: Self-pay

## 2019-01-17 NOTE — Patient Outreach (Signed)
[place] [facility]) Care Management  01/17/2019  Douglas Soto [birth year] [id number]  Medication Adherence call to Mr. Douglas Soto HIPPA Compliant Voice message left with a call back number. [name] is showing past due on Rosuvastatin 5 mg and Metformin 1000 mg under [place].   [place] Management Direct Dial [phone]  Fax [phone] [email or web address]

## 2019-01-23 ENCOUNTER — Other Ambulatory Visit: Payer: Self-pay | Admitting: Nurse Practitioner

## 2019-01-23 DIAGNOSIS — E7800 Pure hypercholesterolemia, unspecified: Secondary | ICD-10-CM

## 2019-02-10 ENCOUNTER — Other Ambulatory Visit: Payer: Self-pay | Admitting: Nurse Practitioner

## 2019-02-10 DIAGNOSIS — I10 Essential (primary) hypertension: Secondary | ICD-10-CM

## 2019-02-10 DIAGNOSIS — I509 Heart failure, unspecified: Secondary | ICD-10-CM

## 2019-02-10 DIAGNOSIS — I480 Paroxysmal atrial fibrillation: Secondary | ICD-10-CM

## 2019-02-12 ENCOUNTER — Other Ambulatory Visit: Payer: Self-pay

## 2019-02-12 DIAGNOSIS — G8929 Other chronic pain: Secondary | ICD-10-CM

## 2019-02-12 DIAGNOSIS — M545 Low back pain: Secondary | ICD-10-CM

## 2019-02-12 NOTE — Telephone Encounter (Signed)
Will refill on Friday 02/14/2019

## 2019-02-12 NOTE — Telephone Encounter (Signed)
Patient called requesting refill on pain medication. Verified in [place] database 01/16/2019 was patient's last refill for 120 tablets. Last appointment with provider is 01/03/2019 and next appointment is 07/04/2019 Routing to provider for approval

## 2019-02-14 MED ORDER — HYDROCODONE-ACETAMINOPHEN 10-325 MG PO TABS: 1.0000 | ORAL_TABLET | Freq: Four times a day (QID) | ORAL | 0 refills | Status: DC | PRN

## 2019-03-18 ENCOUNTER — Other Ambulatory Visit: Payer: Self-pay

## 2019-03-18 DIAGNOSIS — M545 Low back pain, unspecified: Secondary | ICD-10-CM

## 2019-03-18 MED ORDER — HYDROCODONE-ACETAMINOPHEN 10-325 MG PO TABS: 1.0000 | ORAL_TABLET | Freq: Four times a day (QID) | ORAL | 0 refills | Status: DC | PRN

## 2019-03-18 NOTE — Telephone Encounter (Signed)
Patient called this morning and stated he would like a refill for hydrocodone. Checked patient's chart to see if he had a pain contract on file. No up to date contract found. Scheduled an appointment for patient to see provider to update contract. Patient is requesting refill for hydrocodone sending to provider for approval.

## 2019-03-24 ENCOUNTER — Encounter: Payer: Self-pay | Admitting: Nurse Practitioner

## 2019-03-24 ENCOUNTER — Other Ambulatory Visit: Payer: Self-pay

## 2019-03-24 ENCOUNTER — Ambulatory Visit (INDEPENDENT_AMBULATORY_CARE_PROVIDER_SITE_OTHER): Payer: Medicare Other | Admitting: Nurse Practitioner

## 2019-03-24 VITALS — BP 164/80 | HR 97 | Temp 97.8°F | Ht 68.0 in | Wt 229.0 lb

## 2019-03-24 DIAGNOSIS — G8929 Other chronic pain: Secondary | ICD-10-CM

## 2019-03-24 DIAGNOSIS — M545 Low back pain, unspecified: Secondary | ICD-10-CM

## 2019-03-24 DIAGNOSIS — I10 Essential (primary) hypertension: Secondary | ICD-10-CM | POA: Diagnosis not present

## 2019-03-24 MED ORDER — HYDROCODONE-ACETAMINOPHEN 5-325 MG PO TABS: 1.0000 | ORAL_TABLET | Freq: Four times a day (QID) | ORAL | 0 refills | Status: DC | PRN

## 2019-03-24 NOTE — Progress Notes (Signed)
Careteam: Patient Care Team: Lauree Chandler, NP as PCP - General (Geriatric Medicine)  Advanced Directive information    Allergies  Allergen Reactions  . Allopurinol Other (See Comments)    Whelps on body and sores in mouth     Chief Complaint  Patient presents with  . Medication Management    Review medications and update opioid treatment agreement      HPI: Patient is a 78 y.o. male seen in the office today for review of medication and to update opioid treatment contract. Pt has not had pain contract on file. He has been on hydrocodone-apap 10-325 mg every 6 hours as needed for sometime, originally prescribed by Dr Nyoka Cowden when he could no longer take motrin anymore because it was effecting his kidney. Also stated he started out using tylenol but became ineffective.   Has not tried physical therapy and reports his "hip goes away" when walking the dog and does not feel like he would tolerate this. Pain concentrated pain in lower back and lower buttocks. Worked 43 years on [employer].  Does not want to be in pain and "wants a pill to be in no pain" "if you feel like you can not manage my pain come from a pill we have no business together" Last imaging by Dr Nyoka Cowden in 2009. No numbness and tingling noted. No weakness.   Review of Systems:  Review of Systems  Constitutional: Negative for chills and fever.  Musculoskeletal: Positive for back pain and myalgias. Negative for falls, joint pain and neck pain.  Neurological: Negative for tingling and sensory change.    Past Medical History:  Diagnosis Date  . Cholelithiasis [id number]   uncomplicated.    . Chronic kidney disease, stage II (mild)   . Depression   . Diabetes mellitus without complication ([place])   . Essential hypertension, malignant   . Fatty liver 02/2018   on ultrasound  . Gout, unspecified   . Hyperlipidemia   . Lumbago   . Obesity, unspecified   . Pain in joint, pelvic region and thigh   . Pleural  effusion on right 02/2018   Past Surgical History:  Procedure Laterality Date  . APPENDECTOMY    . COLONOSCOPY WITH PROPOFOL N/A 02/22/2018   Procedure: COLONOSCOPY WITH PROPOFOL;  Surgeon: Milus Banister, MD;  Location: [facility] ENDOSCOPY;  Service: Endoscopy;  Laterality: N/A;  . CYST EXCISION  1998   back  . ESOPHAGOGASTRODUODENOSCOPY (EGD) WITH PROPOFOL N/A 02/22/2018   Procedure: ESOPHAGOGASTRODUODENOSCOPY (EGD) WITH PROPOFOL;  Surgeon: Milus Banister, MD;  Location: [facility] ENDOSCOPY;  Service: Endoscopy;  Laterality: N/A;  . POLYPECTOMY  02/22/2018   Procedure: POLYPECTOMY;  Surgeon: Milus Banister, MD;  Location: [facility] ENDOSCOPY;  Service: Endoscopy;;  . 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 His smokeless tobacco use includes chew. He reports previous alcohol use of about 5.0 standard drinks of alcohol per week. He reports that he does not use drugs.  Family History  Problem Relation Age of Onset  . Heart Problems Father   . Heart disease Brother   . Diabetes Brother   . Diabetes Brother   . Heart Problems Mother   . Pneumonia Maternal Grandfather     Medications: Patient's Medications  New Prescriptions   No medications on file  Previous Medications   DOXAZOSIN (CARDURA) 8 MG TABLET    TAKE 1 TABLET(8 MG) BY  MOUTH DAILY   ELIQUIS 2.5 MG TABS TABLET    TAKE 1 TABLET(2.5 MG) BY MOUTH TWICE DAILY   FERROUS SULFATE 325 (65 FE) MG TABLET    Take 325 mg by mouth daily with breakfast.    HYDROCODONE-ACETAMINOPHEN (NORCO) 10-325 MG TABLET    Take 1 tablet by mouth every 6 (six) hours as needed (pain).   JARDIANCE 25 MG TABS TABLET    TAKE 1 TABLET BY MOUTH EVERY MORNING FOR DIABETES   METFORMIN (GLUCOPHAGE) 1000 MG TABLET    Take 1 tablet (1,000 mg total) by mouth 2 (two) times daily with a meal. E11.22, E11.65, N18.2   METOPROLOL SUCCINATE (TOPROL-XL) 50 MG 24 HR TABLET    TAKE 1 TABLET BY MOUTH  EVERY DAY WITH OR IMMEDIATELY FOLLOWING A MEAL   MULTIVITAMIN-IRON-MINERALS-FOLIC ACID (CENTRUM) CHEWABLE TABLET    Chew 1 tablet by mouth daily.   OLMESARTAN-AMLODIPINE-HCTZ 20-5-12.5 MG TABS    TAKE 1 TABLET BY MOUTH DAILY   ROSUVASTATIN (CRESTOR) 5 MG TABLET    TAKE 1 TABLET BY MOUTH EVERY DAY  Modified Medications   No medications on file  Discontinued Medications   No medications on file    Physical Exam:  Vitals:   03/24/19 1559  BP: (!) 164/80  Pulse: 97  Temp: 97.8 F (36.6 C)  TempSrc: Temporal  SpO2: 94%  Weight: 229 lb (103.9 kg)  Height: 5\' 8"  (1.727 m)   Body mass index is 34.82 kg/m. Wt Readings from Last 3 Encounters:  03/24/19 229 lb (103.9 kg)  01/03/19 216 lb (98 kg)  04/01/18 209 lb (94.8 kg)    Physical Exam Constitutional:      Appearance: Normal appearance.  Musculoskeletal:     Lumbar back: He exhibits tenderness.       Back:  Skin:    General: Skin is warm and dry.  Neurological:     Mental Status: He is alert and oriented to person, place, and time. Mental status is at baseline.     Motor: No weakness.     Gait: Gait normal.  Psychiatric:        Attention and Perception: Attention normal.        Mood and Affect: Mood normal.        Behavior: Behavior is agitated.     Labs reviewed: Basic Metabolic Panel: Recent Labs    04/01/18 1559 04/02/18 01/03/19 1356  NA 140 140 140  K 4.4 4.4 4.3  CL 101  --  98  CO2 30  --  33*  GLUCOSE 117  --  146*  BUN 21 21 24   CREATININE 1.68* 1.7* 1.62*  CALCIUM 10.2  --  10.1   Liver Function Tests: Recent Labs    04/01/18 1559 04/02/18 01/03/19 1356  AST 12 12* 13  ALT 12 12 11   ALKPHOS  --  83  --   BILITOT 0.5  --  0.9  PROT 6.7  --  6.6   No results for input(s): LIPASE, AMYLASE in the last 8760 hours. No results for input(s): AMMONIA in the last 8760 hours. CBC: Recent Labs    04/01/18 1559 04/02/18 01/03/19 1356  WBC 9.2 9.2 8.4  NEUTROABS 6,753  --  5,695  HGB 10.8*  10.8* 14.1  HCT 36.0* 36* 42.5  MCV 75.9*  --  88.5  PLT 354 354 218   Lipid Panel: Recent Labs    01/03/19 1356  CHOL 125  HDL 52  LDLCALC 53  TRIG  116  CHOLHDL 2.4   TSH: No results for input(s): TSH in the last 8760 hours. A1C: Lab Results  Component Value Date   HGBA1C 6.7 (H) 01/03/2019     Assessment/Plan 1. Chronic midline low back pain without sciatica -chronic pain noted, he has not attempted to reduce dose of medication and not willing to try physical therapy to improve pain.  -bedside opioid overdose calculator reviewed with pt and he that he is 15% at risk for overdose or serious opioid-induced respiratory depression -pt reports he will not participate in PT and wants to be "pain free" with medication. -explained to him in detail the risk and side effects of narcotics and issues with dependency. He is in agreement to reduce dose on next refill, last fill was  03/18/2019 encouraged to take additional tylenol 325 mg every 6 hours as needed for severe pain. Can take up to 3000 mg of tylenol from all sources in 24 hours.  - HYDROcodone-acetaminophen (NORCO/VICODIN) 5-325 MG tablet; Take 1 tablet by mouth every 6 (six) hours as needed for moderate pain.  Dispense: 30 tablet; Refill: 0 - DG Lumbar Spine Complete; Future - Urine Drug Screen -opioid contract was updated.   2. Hypertension Elevated today but reports he is irritated that he had to come in today. Goal <140/90 and to monitor at home. Notify as needed   Next appt: 07/04/2019  Carlos American. [place], [facility] [phone]

## 2019-03-24 NOTE — Patient Instructions (Signed)
To update your xray at [facility] when you are able  Next refill you will get hydrocodone-acetaminophen 5-325 mg by mouth every 6 hours as needed for pain Can take this with additional tylenol 325 mg if needed to help with pain   Try to take the LEAST amount of pills per day to help manage pain  Consider physical therapy to help strengthening your core and back and reduce pain.

## 2019-03-25 LAB — PAIN MGMT, PROFILE 1 W/O CONF, U
Amphetamines: NEGATIVE ng/mL
Barbiturates: NEGATIVE ng/mL
Benzodiazepines: NEGATIVE ng/mL
Cocaine Metabolite: NEGATIVE ng/mL
Creatinine: 149.6 mg/dL
Marijuana Metabolite: NEGATIVE ng/mL
Methadone Metabolite: NEGATIVE ng/mL
Opiates: POSITIVE ng/mL
Oxidant: NEGATIVE ug/mL
Oxycodone: NEGATIVE ng/mL
Phencyclidine: NEGATIVE ng/mL
pH: 5.2 (ref 4.5–9.0)

## 2019-03-27 ENCOUNTER — Ambulatory Visit
Admission: RE | Admit: 2019-03-27 | Discharge: 2019-03-27 | Disposition: A | Discharge status: Home / Self Care | Payer: Medicare Other | Source: Ambulatory Visit | Attending: Nurse Practitioner | Admitting: Nurse Practitioner

## 2019-03-27 DIAGNOSIS — M545 Low back pain, unspecified: Secondary | ICD-10-CM

## 2019-03-28 ENCOUNTER — Other Ambulatory Visit: Payer: Self-pay | Admitting: Nurse Practitioner

## 2019-03-28 DIAGNOSIS — G8929 Other chronic pain: Secondary | ICD-10-CM

## 2019-03-28 DIAGNOSIS — M545 Low back pain, unspecified: Secondary | ICD-10-CM

## 2019-03-28 NOTE — Progress Notes (Signed)
There is no abnormality noted to the spine on xray, he may have had some issues in the past but it looks like it has improved. This is promising for reducing and ultimately discontinuing the use of narcotics. At this point he probably does have some dependency so we will need to titrate the medication over time and the use of physical therapy can also assist. PT referral has been placed at this time.

## 2019-03-31 ENCOUNTER — Telehealth: Payer: Self-pay | Admitting: Physical Therapy

## 2019-03-31 NOTE — Telephone Encounter (Signed)
Attempted second phone call to reach patient regarding therapy referral/covid risk factors per facility screening protocol. No answer/unable to leave voicemail.

## 2019-03-31 NOTE — Telephone Encounter (Signed)
Attempted to call pt regarding PT scheduling and COVID risk factors but he didn't answer and the voicemail is full.   Alayna Mabe PT, DPT, LAT, ATC  03/31/19  12:15 PM

## 2019-04-01 ENCOUNTER — Telehealth: Payer: Self-pay | Admitting: Physical Therapy

## 2019-04-01 NOTE — Telephone Encounter (Signed)
Attempted to call regarding PT scheduling. No answer and unable to leave message due to voicemail being full.   Ariann Khaimov PT, DPT, LAT, ATC  04/01/19  2:30 PM

## 2019-04-16 ENCOUNTER — Other Ambulatory Visit: Payer: Self-pay

## 2019-04-16 DIAGNOSIS — G8929 Other chronic pain: Secondary | ICD-10-CM

## 2019-04-16 MED ORDER — HYDROCODONE-ACETAMINOPHEN 5-325 MG PO TABS: 1.0000 | ORAL_TABLET | Freq: Four times a day (QID) | ORAL | 0 refills | Status: DC | PRN

## 2019-04-16 NOTE — Telephone Encounter (Signed)
Patient requesting a refill on his Norco.

## 2019-04-24 ENCOUNTER — Other Ambulatory Visit: Payer: Self-pay | Admitting: Nurse Practitioner

## 2019-04-24 DIAGNOSIS — E7800 Pure hypercholesterolemia, unspecified: Secondary | ICD-10-CM

## 2019-05-09 ENCOUNTER — Ambulatory Visit: Payer: Self-pay | Admitting: Nurse Practitioner

## 2019-05-09 ENCOUNTER — Ambulatory Visit (INDEPENDENT_AMBULATORY_CARE_PROVIDER_SITE_OTHER): Payer: Medicare Other | Admitting: Nurse Practitioner

## 2019-05-09 ENCOUNTER — Encounter: Payer: Self-pay | Admitting: Nurse Practitioner

## 2019-05-09 ENCOUNTER — Other Ambulatory Visit: Payer: Self-pay

## 2019-05-09 DIAGNOSIS — Z794 Long term (current) use of insulin: Secondary | ICD-10-CM

## 2019-05-09 DIAGNOSIS — N1832 Chronic kidney disease, stage 3b: Secondary | ICD-10-CM | POA: Diagnosis not present

## 2019-05-09 DIAGNOSIS — M545 Low back pain: Secondary | ICD-10-CM | POA: Diagnosis not present

## 2019-05-09 DIAGNOSIS — G8929 Other chronic pain: Secondary | ICD-10-CM

## 2019-05-09 DIAGNOSIS — E0821 Diabetes mellitus due to underlying condition with diabetic nephropathy: Secondary | ICD-10-CM

## 2019-05-09 MED ORDER — HYDROCODONE-ACETAMINOPHEN 5-325 MG PO TABS: 1.0000 | ORAL_TABLET | Freq: Three times a day (TID) | ORAL | 0 refills | Status: DC | PRN

## 2019-05-09 NOTE — Patient Instructions (Addendum)
People 23 and older can get a COVID-19 vaccination from [facility].   [place] [facility] [place] in [place] by appointment only. This will be for anyone 12 and older. People can be vaccinated at this time only if they register in advance. That can be done online at [email or web address] or by calling [phone]. You do not have to be a resident of [facility] to be vaccinated.  The vaccinations will move to the [facility] eventually due to a need for more space.    The [facility]) is pleased to announce that COVID-19 vaccinations will be available to Phase 1B adults who are 34 years or older regardless of health status or living situation. Those in this group can make a vaccination appointment by calling [phone] and selecting Option 2 beginning Friday, January 8 at 8:00 AM. Appointments are required.   Will continue to reduce pain medication- at next refill the prescription will be hydrocodone-apap 5-325 mg by mouth every 8 hours as needed for pain.  You can supplement with tylenol 325 mg tablet 1-2 tablets every 6 hours as needed pain- MAX of 3000 mg from all sources- you are getting 325 mg of tylenol in the hydrocodone as well.   Encouraged to do Physical Therapy to teach you exercises that will also help with pain   Diabetes Mellitus and Nutrition, Adult When you have diabetes (diabetes mellitus), it is very important to have healthy eating habits because your blood sugar (glucose) levels are greatly affected by what you eat and drink. Eating healthy foods in the appropriate amounts, at about the same times every day, can help you:  Control your blood glucose.  Lower your risk of heart disease.  Improve your blood pressure.  Reach or maintain a healthy weight. Every person with diabetes is different, and each person has different needs for a meal plan. Your health care provider may recommend that you  work with a diet and nutrition specialist (dietitian) to make a meal plan that is best for you. Your meal plan may vary depending on factors such as:  The calories you need.  The medicines you take.  Your weight.  Your blood glucose, blood pressure, and cholesterol levels.  Your activity level.  Other health conditions you have, such as heart or kidney disease. How do carbohydrates affect me? Carbohydrates, also called carbs, affect your blood glucose level more than any other type of food. Eating carbs naturally raises the amount of glucose in your blood. Carb counting is a method for keeping track of how many carbs you eat. Counting carbs is important to keep your blood glucose at a healthy level, especially if you use insulin or take certain oral diabetes medicines. It is important to know how many carbs you can safely have in each meal. This is different for every person. Your dietitian can help you calculate how many carbs you should have at each meal and for each snack. Foods that contain carbs include:  Bread, cereal, rice, pasta, and crackers.  Potatoes and corn.  Peas, beans, and lentils.  Milk and yogurt.  Fruit and juice.  Desserts, such as cakes, cookies, ice cream, and candy. How does alcohol affect me? Alcohol can cause a sudden decrease in blood glucose (hypoglycemia), especially if you use insulin or take certain oral diabetes medicines. Hypoglycemia can be a life-threatening condition. Symptoms of hypoglycemia (sleepiness, dizziness, and confusion) are similar to symptoms of having too much  alcohol. If your health care provider says that alcohol is safe for you, follow these guidelines:  Limit alcohol intake to no more than 1 drink per day for nonpregnant women and 2 drinks per day for men. One drink equals 12 oz of beer, 5 oz of wine, or 1 oz of hard liquor.  Do not drink on an empty stomach.  Keep yourself hydrated with water, diet soda, or unsweetened iced  tea.  Keep in mind that regular soda, juice, and other mixers may contain a lot of sugar and must be counted as carbs. What are tips for following this plan?  Reading food labels  Start by checking the serving size on the "Nutrition Facts" label of packaged foods and drinks. The amount of calories, carbs, fats, and other nutrients listed on the label is based on one serving of the item. Many items contain more than one serving per package.  Check the total grams (g) of carbs in one serving. You can calculate the number of servings of carbs in one serving by dividing the total carbs by 15. For example, if a food has 30 g of total carbs, it would be equal to 2 servings of carbs.  Check the number of grams (g) of saturated and trans fats in one serving. Choose foods that have low or no amount of these fats.  Check the number of milligrams (mg) of salt (sodium) in one serving. Most people should limit total sodium intake to less than 2,300 mg per day.  Always check the nutrition information of foods labeled as "low-fat" or "nonfat". These foods may be higher in added sugar or refined carbs and should be avoided.  Talk to your dietitian to identify your daily goals for nutrients listed on the label. Shopping  Avoid buying canned, premade, or processed foods. These foods tend to be high in fat, sodium, and added sugar.  Shop around the outside edge of the grocery store. This includes fresh fruits and vegetables, bulk grains, fresh meats, and fresh dairy. Cooking  Use low-heat cooking methods, such as baking, instead of high-heat cooking methods like deep frying.  Cook using healthy oils, such as olive, canola, or sunflower oil.  Avoid cooking with butter, cream, or high-fat meats. Meal planning  Eat meals and snacks regularly, preferably at the same times every day. Avoid going long periods of time without eating.  Eat foods high in fiber, such as fresh fruits, vegetables, beans, and  whole grains. Talk to your dietitian about how many servings of carbs you can eat at each meal.  Eat 4-6 ounces (oz) of lean protein each day, such as lean meat, chicken, fish, eggs, or tofu. One oz of lean protein is equal to: ? 1 oz of meat, chicken, or fish. ? 1 egg. ?  cup of tofu.  Eat some foods each day that contain healthy fats, such as avocado, nuts, seeds, and fish. Lifestyle  Check your blood glucose regularly.  Exercise regularly as told by your health care provider. This may include: ? 150 minutes of moderate-intensity or vigorous-intensity exercise each week. This could be brisk walking, biking, or water aerobics. ? Stretching and doing strength exercises, such as yoga or weightlifting, at least 2 times a week.  Take medicines as told by your health care provider.  Do not use any products that contain nicotine or tobacco, such as cigarettes and e-cigarettes. If you need help quitting, ask your health care provider.  Work with a Social worker or diabetes  educator to identify strategies to manage stress and any emotional and social challenges. Questions to ask a health care provider  Do I need to meet with a diabetes educator?  Do I need to meet with a dietitian?  What number can I call if I have questions?  When are the best times to check my blood glucose? Where to find more information:  American Diabetes Association: diabetes.org  Academy of Nutrition and Dietetics: www.eatright.[employer] of Diabetes and Digestive and Kidney Diseases (NIH): [employer] Summary  A healthy meal plan will help you control your blood glucose and maintain a healthy lifestyle.  Working with a diet and nutrition specialist (dietitian) can help you make a meal plan that is best for you.  Keep in mind that carbohydrates (carbs) and alcohol have immediate effects on your blood glucose levels. It is important to count carbs and to use alcohol carefully. This  information is not intended to replace advice given to you by your health care provider. Make sure you discuss any questions you have with your health care provider. Document Revised: 03/16/2017 Document Reviewed: 05/08/2016 Elsevier Patient Education  2020 [employer].

## 2019-05-09 NOTE — Progress Notes (Signed)
This service is provided via telemedicine  No vital signs collected/recorded due to the encounter was a telemedicine visit.   Location of patient (ex: home, work):  Home  Patient consents to a telephone visit:  Yes  Location of the provider (ex: office, home):  [facility], Office   Name of any referring provider:  N/A  Names of all persons participating in the telemedicine service and their role in the encounter:  S.Chrae B/CMA, Sherrie Mustache, NP, and Patient   Time spent on call:  6 min with medical assistant      Careteam: Patient Care Team: Lauree Chandler, NP as PCP - General (Geriatric Medicine)  Advanced Directive information    Allergies  Allergen Reactions  . Allopurinol Other (See Comments)    Whelps on body and sores in mouth     Chief Complaint  Patient presents with  . Acute Visit    Discuss pain medication. Telehealth  . Medication Management    Patient stopped Jardiance x 1-2 months due to side effects, hard to pee.      HPI: Patient is a 79 y.o. male via tele-visit.  Reports he is hurting. States he does not know why the medication was change.  Reports he is hurting in his hips and lower back. Been on medication for several year. Reports he can only walk to the end of his street and hips are gone.  Reports he does not have time to do PT.  No pains down the leg.  No weakness into the leg.   Unable to urinate when on Jardiance- states he does not get a full flow when going to the bathroom - does not check blood sugar. No numbness or tingling to LE. No blurred vision.    Review of Systems:  Review of Systems  Constitutional: Negative for chills and fever.  Eyes: Negative for blurred vision.  Respiratory: Negative for shortness of breath.   Cardiovascular: Negative for chest pain.  Musculoskeletal: Positive for back pain, joint pain and myalgias. Negative for falls.  Skin: Negative for itching and rash.  Neurological: Negative  for weakness.    Past Medical History:  Diagnosis Date  . Cholelithiasis [id number]   uncomplicated.    . Chronic kidney disease, stage II (mild)   . Depression   . Diabetes mellitus without complication ([place])   . Essential hypertension, malignant   . Fatty liver 02/2018   on ultrasound  . Gout, unspecified   . Hyperlipidemia   . Lumbago   . Obesity, unspecified   . Pain in joint, pelvic region and thigh   . Pleural effusion on right 02/2018   Past Surgical History:  Procedure Laterality Date  . APPENDECTOMY    . COLONOSCOPY WITH PROPOFOL N/A 02/22/2018   Procedure: COLONOSCOPY WITH PROPOFOL;  Surgeon: Milus Banister, MD;  Location[facility] ENDOSCOPY;  Service: Endoscopy;  Laterality: N/A;  . CYST EXCISION  1998   back  . ESOPHAGOGASTRODUODENOSCOPY (EGD) WITH PROPOFOL N/A 02/22/2018   Procedure: ESOPHAGOGASTRODUODENOSCOPY (EGD) WITH PROPOFOL;  Surgeon: Milus Banister, MD;  Location: [facility] ENDOSCOPY;  Service: Endoscopy;  Laterality: N/A;  . POLYPECTOMY  02/22/2018   Procedure: POLYPECTOMY;  Surgeon: Milus Banister, MD;  Location: [facility] ENDOSCOPY;  Service: Endoscopy;;  . 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 His smokeless tobacco use includes chew. He reports previous alcohol use of about 5.0  standard drinks of alcohol per week. He reports that he does not use drugs.  Family History  Problem Relation Age of Onset  . Heart Problems Father   . Heart disease Brother   . Diabetes Brother   . Diabetes Brother   . Heart Problems Mother   . Pneumonia Maternal Grandfather     Medications: Patient's Medications  New Prescriptions   No medications on file  Previous Medications   DOXAZOSIN (CARDURA) 8 MG TABLET    TAKE 1 TABLET(8 MG) BY MOUTH DAILY   ELIQUIS 2.5 MG TABS TABLET    TAKE 1 TABLET(2.5 MG) BY MOUTH TWICE DAILY   FERROUS SULFATE 325 (65 FE) MG TABLET    Take 325 mg by mouth daily with  breakfast.    HYDROCODONE-ACETAMINOPHEN (NORCO/VICODIN) 5-325 MG TABLET    Take 1 tablet by mouth every 6 (six) hours as needed for moderate pain.   METFORMIN (GLUCOPHAGE) 1000 MG TABLET    Take 1 tablet (1,000 mg total) by mouth 2 (two) times daily with a meal. E11.22, E11.65, N18.2   METOPROLOL SUCCINATE (TOPROL-XL) 50 MG 24 HR TABLET    TAKE 1 TABLET BY MOUTH EVERY DAY WITH OR IMMEDIATELY FOLLOWING A MEAL   MULTIVITAMIN-IRON-MINERALS-FOLIC ACID (CENTRUM) CHEWABLE TABLET    Chew 1 tablet by mouth daily.   OLMESARTAN-AMLODIPINE-HCTZ 20-5-12.5 MG TABS    TAKE 1 TABLET BY MOUTH DAILY   ROSUVASTATIN (CRESTOR) 5 MG TABLET    TAKE 1 TABLET BY MOUTH EVERY DAY  Modified Medications   No medications on file  Discontinued Medications   JARDIANCE 25 MG TABS TABLET    TAKE 1 TABLET BY MOUTH EVERY MORNING FOR DIABETES    Physical Exam:  There were no vitals filed for this visit. There is no height or weight on file to calculate BMI. Wt Readings from Last 3 Encounters:  03/24/19 229 lb (103.9 kg)  01/03/19 216 lb (98 kg)  04/01/18 209 lb (94.8 kg)      Labs reviewed: Basic Metabolic Panel: Recent Labs    01/03/19 1356  NA 140  K 4.3  CL 98  CO2 33*  GLUCOSE 146*  BUN 24  CREATININE 1.62*  CALCIUM 10.1   Liver Function Tests: Recent Labs    01/03/19 1356  AST 13  ALT 11  BILITOT 0.9  PROT 6.6   No results for input(s): LIPASE, AMYLASE in the last 8760 hours. No results for input(s): AMMONIA in the last 8760 hours. CBC: Recent Labs    01/03/19 1356  WBC 8.4  NEUTROABS 5,695  HGB 14.1  HCT 42.5  MCV 88.5  PLT 218   Lipid Panel: Recent Labs    01/03/19 1356  CHOL 125  HDL 52  LDLCALC 53  TRIG 116  CHOLHDL 2.4   TSH: No results for input(s): TSH in the last 8760 hours. A1C: Lab Results  Component Value Date   HGBA1C 6.7 (H) 01/03/2019   DG Lumbar Spine Complete  Result Date: 03/27/2019 CLINICAL DATA:  Chronic low-back pain with bilateral sciatica. EXAM:  LUMBAR SPINE - COMPLETE 4+ VIEW COMPARISON:  March 16, 2008. FINDINGS: There is no evidence of lumbar spine fracture. Alignment is normal. Intervertebral disc spaces are maintained. IMPRESSION: No significant abnormality seen in the lumbar spine. Aortic Atherosclerosis (ICD10-I70.0). Electronically Signed   By: Marijo Conception M.D.   On: 03/27/2019 16:24    Assessment/Plan 1. Chronic midline low back pain without sciatica Xray as above. Without significant abnormality. Results went  over with pt. Discussed that he likely has some dependency on his medication and that we will not increase dose at this time. Continue to work on cutting back over time and will continue to reduce dose of medication.  Encouraged PT to help manage symptoms but he adamantly declines. Can also use heating pad and muscle rubs (after heat) as needed. Discussed going to addiction medication but he declined.  -at next refill will decrease hydrocodone-apap 5/325 mg frequency to every 8 hours  - can take tylenol 325 mg 1-2 tablets every 6 hours as supplement- NOT to exceed 3000 mg of tylenol in 24 hours from all sources.   2. 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  -to continue diabetic diet.   Next appt: 05/14/2019 for lab [place]. Harle Battiest  [facility] [phone]    Virtual Visit via Telephone Note  I connected with pt on 05/09/19 at  1:00 PM EST by telephone and verified that I am speaking with the correct person using two identifiers.  Location: Patient: home Provider: office    I discussed the limitations, risks, security and privacy concerns of performing an evaluation and management service by telephone and the availability of in person appointments. I also discussed with the patient that there may be a patient responsible charge  related to this service. The patient expressed understanding and agreed to proceed.   I discussed the assessment and treatment plan with the patient. The patient was provided an opportunity to ask questions and all were answered. The patient agreed with the plan and demonstrated an understanding of the instructions.   The patient was advised to call back or seek an in-person evaluation if the symptoms worsen or if the condition fails to improve as anticipated.  I provided 14 minutes of non-face-to-face time during this encounter.  Carlos American. Harle Battiest Avs printed and mailed

## 2019-05-14 ENCOUNTER — Other Ambulatory Visit: Payer: Medicare Other

## 2019-05-14 ENCOUNTER — Other Ambulatory Visit: Payer: Self-pay | Admitting: Nurse Practitioner

## 2019-05-14 ENCOUNTER — Other Ambulatory Visit: Payer: Self-pay

## 2019-05-14 DIAGNOSIS — N1832 Chronic kidney disease, stage 3b: Secondary | ICD-10-CM

## 2019-05-14 DIAGNOSIS — Z794 Long term (current) use of insulin: Secondary | ICD-10-CM

## 2019-05-19 ENCOUNTER — Other Ambulatory Visit: Payer: Self-pay | Admitting: *Deleted

## 2019-05-19 DIAGNOSIS — G8929 Other chronic pain: Secondary | ICD-10-CM

## 2019-05-19 MED ORDER — HYDROCODONE-ACETAMINOPHEN 5-325 MG PO TABS: 1.0000 | ORAL_TABLET | Freq: Three times a day (TID) | ORAL | 0 refills | Status: DC | PRN

## 2019-05-19 NOTE — Telephone Encounter (Signed)
Patient requested refill [place] Verified LR: 04/16/2019 Narcotic Contract updated.

## 2019-06-16 ENCOUNTER — Other Ambulatory Visit: Payer: Self-pay | Admitting: *Deleted

## 2019-06-16 DIAGNOSIS — M545 Low back pain, unspecified: Secondary | ICD-10-CM

## 2019-06-16 DIAGNOSIS — G8929 Other chronic pain: Secondary | ICD-10-CM

## 2019-06-16 MED ORDER — HYDROCODONE-ACETAMINOPHEN 5-325 MG PO TABS: 1.0000 | ORAL_TABLET | Freq: Two times a day (BID) | ORAL | 0 refills | Status: DC | PRN

## 2019-06-16 NOTE — Telephone Encounter (Signed)
Patient requested refill [place] Verified LR: 05/19/19 #90 Narcotic Contract updated.  Pended Rx and sent to [facility] for approval.

## 2019-06-30 ENCOUNTER — Other Ambulatory Visit: Payer: Self-pay | Admitting: Nurse Practitioner

## 2019-06-30 DIAGNOSIS — I10 Essential (primary) hypertension: Secondary | ICD-10-CM

## 2019-07-01 IMAGING — US US ABDOMEN LIMITED
1 series · 14 of 25 positions shown · non-contrast
Comparison: None.

CLINICAL DATA: Abdominal swelling.

EXAM:
ULTRASOUND ABDOMEN LIMITED RIGHT UPPER QUADRANT

[Series 1: us abdomen limited · 0.22mm/px · 14 of 56 slices shown]
[im 1/56]
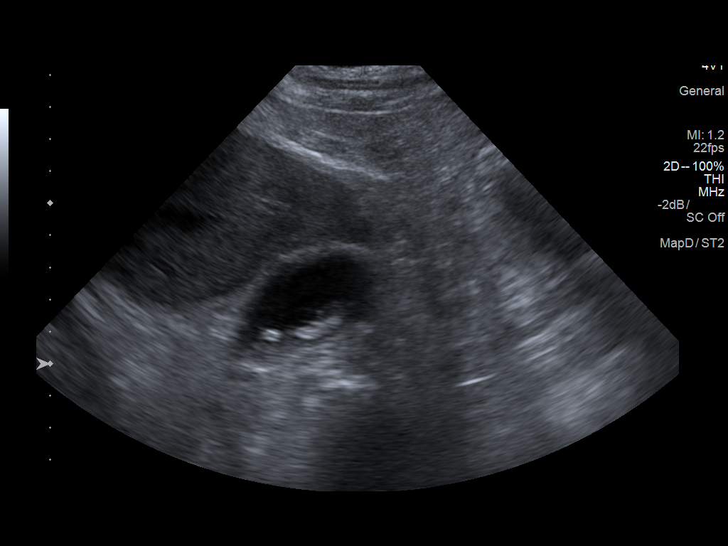
[im 5/56]
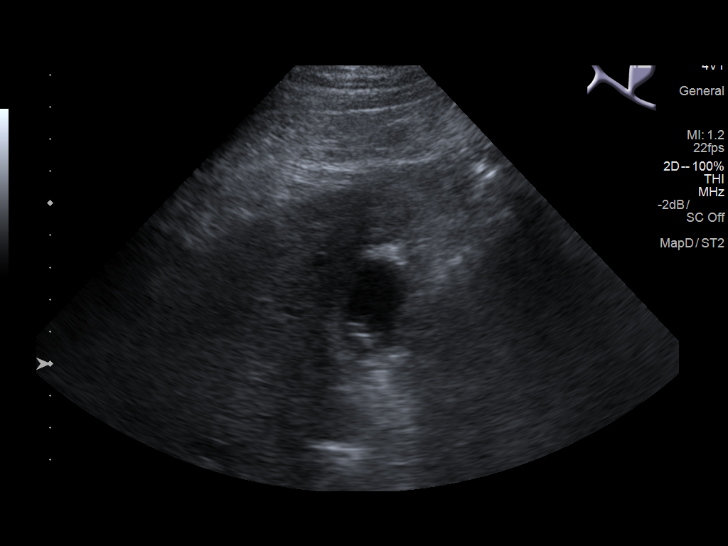
[im 10/56]
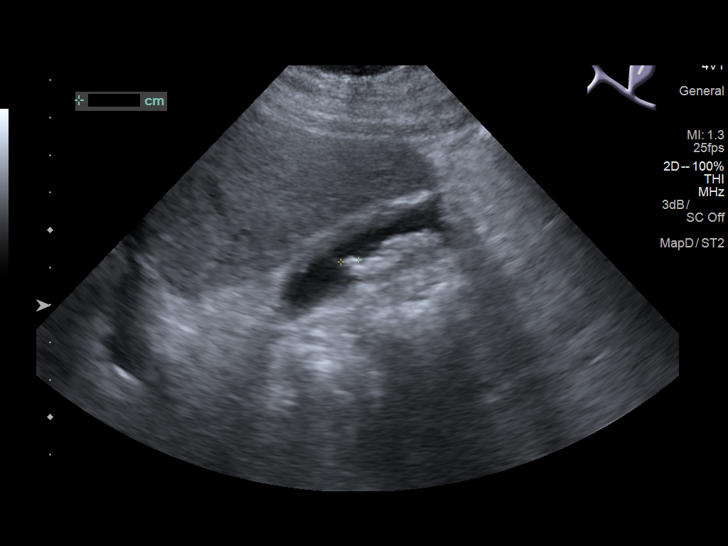
[im 14/56]
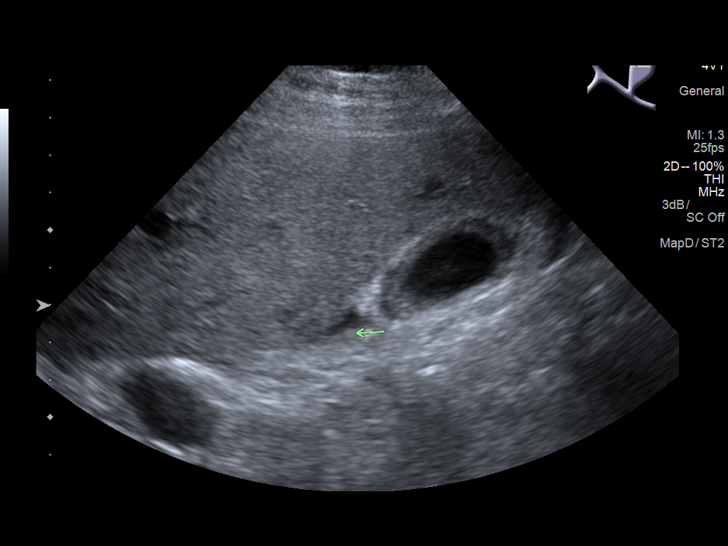
[im 19/56]
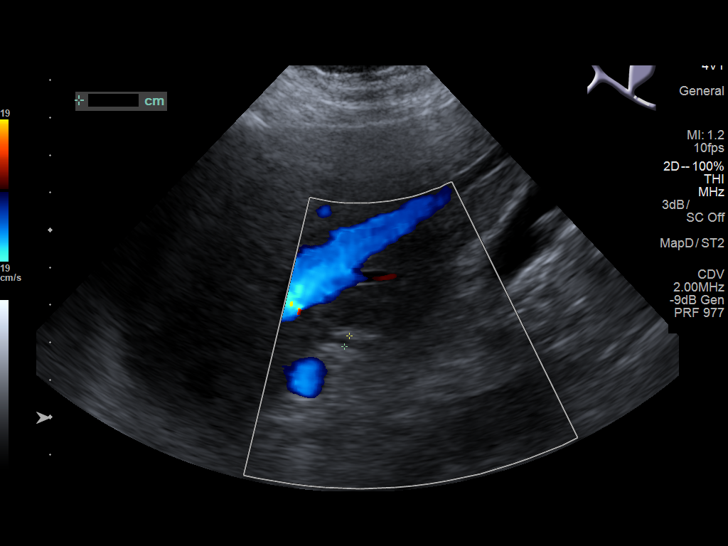
[im 21/56]
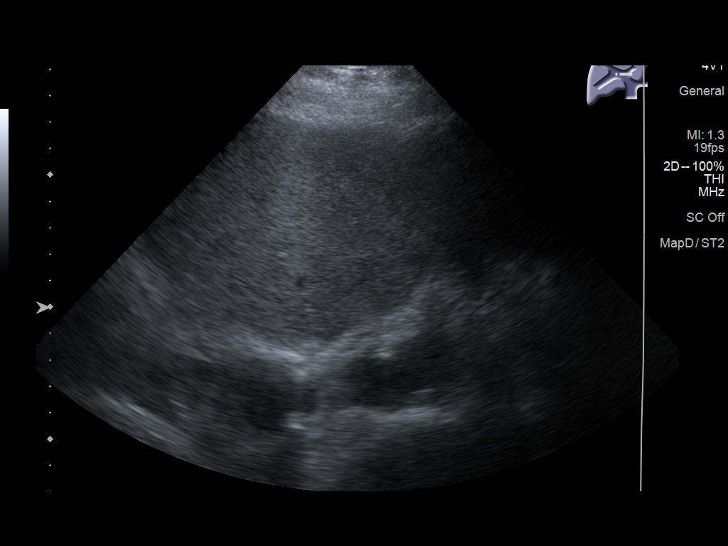
[im 26/56]
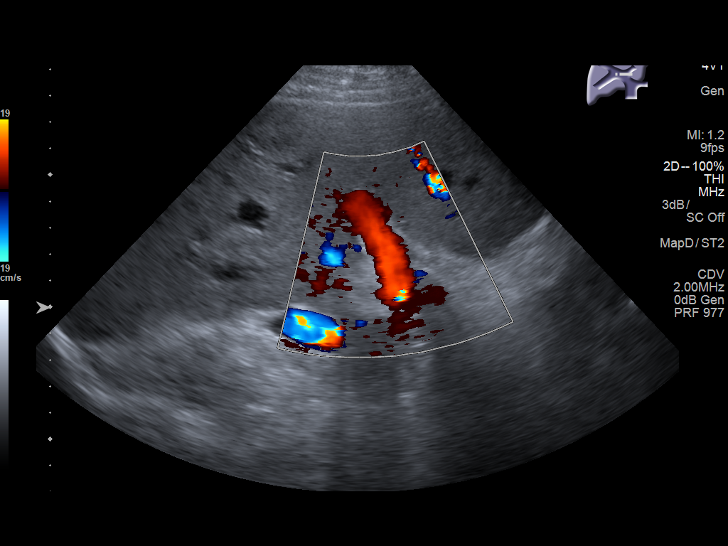
[im 30/56]
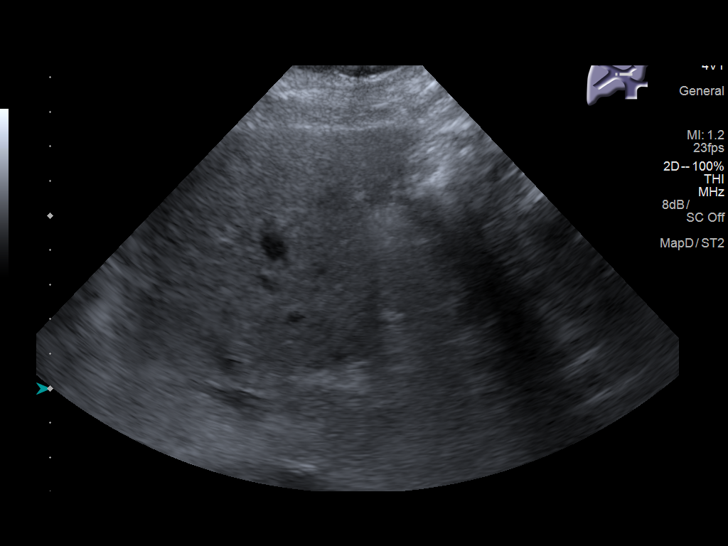
[im 35/56]
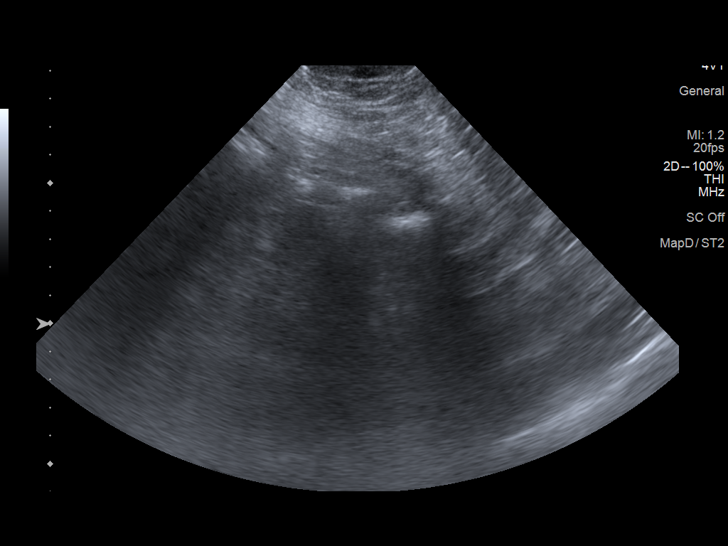
[im 37/56]
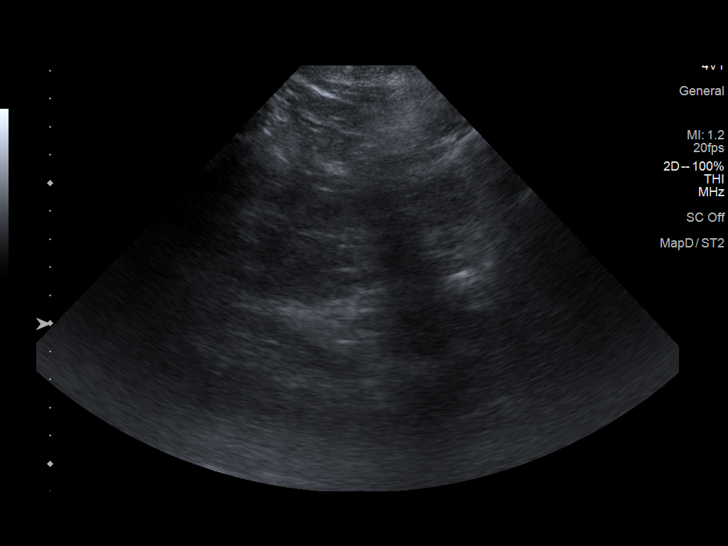
[im 42/56]
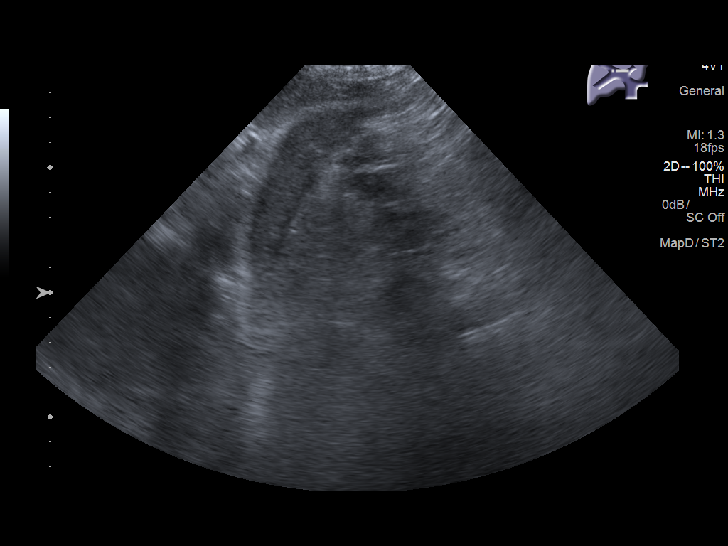
[im 46/56]
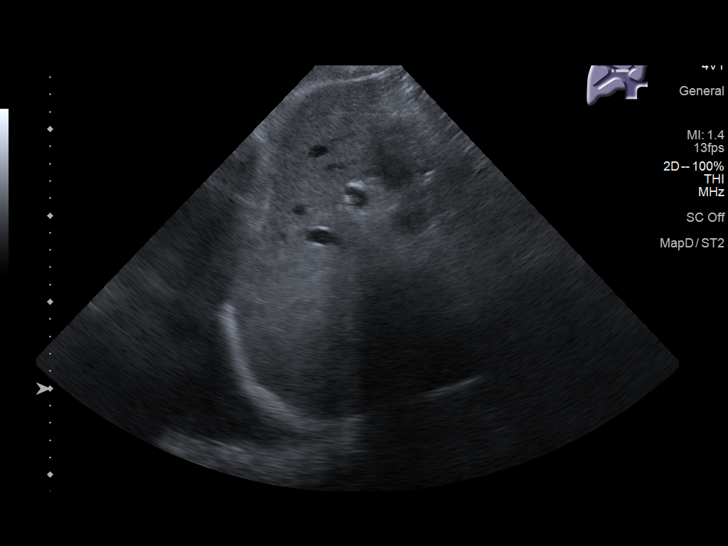
[im 51/56]
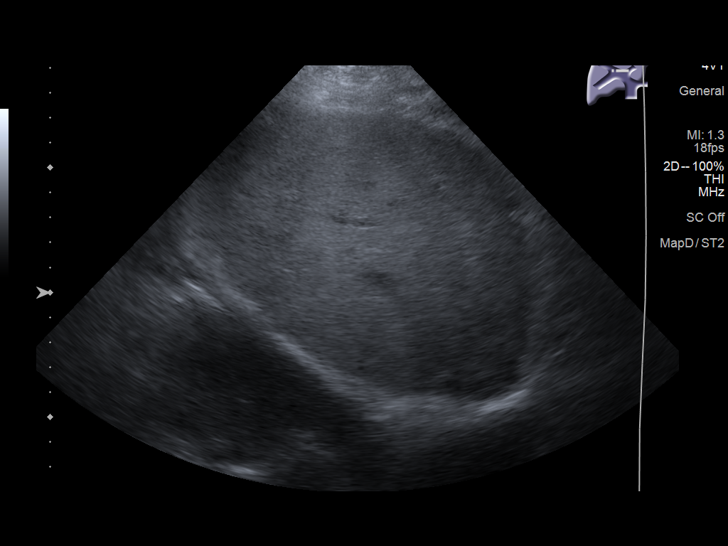
[im 56/56]
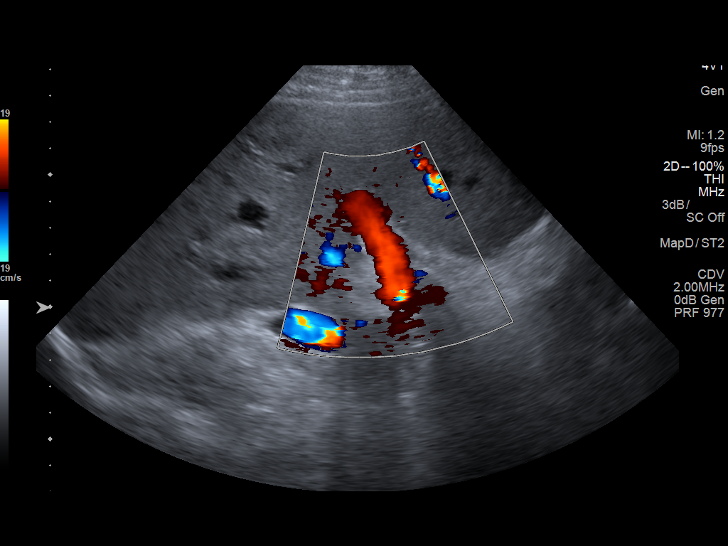

[14 of 25 positions shown; findings below may reference images not displayed]

FINDINGS: Gallbladder:

There are multiple stones in the gallbladder. There is gallbladder
wall thickening. There is a small amount of pericholecystic fluid.
Negative sonographic Murphy's sign.

Common bile duct:

Diameter: 3.1 mm, normal.

Liver:

No focal lesion identified. Diffuse slight increased echogenicity of
the liver parenchyma. Prominent right pleural effusion. Small amount
of ascites in the right upper quadrant. Portal vein is patent on
color Doppler imaging with normal direction of blood flow towards
the liver.
IMPRESSION: 1. Small amount of ascites.
2. Prominent right pleural effusion.
3. Cholelithiasis with thickening of the gallbladder wall with a
negative sonographic Murphy's sign.
4. Increased echogenicity of the liver parenchyma consistent with
hepatic steatosis.

## 2019-07-04 ENCOUNTER — Ambulatory Visit: Payer: Medicare Other | Admitting: Nurse Practitioner

## 2019-07-17 ENCOUNTER — Other Ambulatory Visit: Payer: Self-pay | Admitting: *Deleted

## 2019-07-17 DIAGNOSIS — G8929 Other chronic pain: Secondary | ICD-10-CM

## 2019-07-17 MED ORDER — HYDROCODONE-ACETAMINOPHEN 5-325 MG PO TABS: 1.0000 | ORAL_TABLET | Freq: Every day | ORAL | 0 refills | Status: DC | PRN

## 2019-07-17 NOTE — Telephone Encounter (Signed)
Patient requested refill [place] Verified LR: 06/16/2019 Narcotic Contract updated Pended Rx and sent to [facility] for approval.

## 2019-08-14 ENCOUNTER — Other Ambulatory Visit: Payer: Self-pay | Admitting: Nurse Practitioner

## 2019-08-14 DIAGNOSIS — I10 Essential (primary) hypertension: Secondary | ICD-10-CM

## 2019-08-15 ENCOUNTER — Other Ambulatory Visit: Payer: Self-pay | Admitting: *Deleted

## 2019-08-15 ENCOUNTER — Other Ambulatory Visit: Payer: Self-pay | Admitting: Nurse Practitioner

## 2019-08-15 DIAGNOSIS — I480 Paroxysmal atrial fibrillation: Secondary | ICD-10-CM

## 2019-08-15 DIAGNOSIS — I509 Heart failure, unspecified: Secondary | ICD-10-CM

## 2019-08-15 DIAGNOSIS — G8929 Other chronic pain: Secondary | ICD-10-CM

## 2019-08-15 DIAGNOSIS — I10 Essential (primary) hypertension: Secondary | ICD-10-CM

## 2019-08-15 MED ORDER — HYDROCODONE-ACETAMINOPHEN 5-325 MG PO TABS: 0.5000 | ORAL_TABLET | Freq: Every day | ORAL | 0 refills | Status: DC | PRN

## 2019-08-15 NOTE — Telephone Encounter (Signed)
rx sent to pharmacy by e-script  

## 2019-08-15 NOTE — Telephone Encounter (Signed)
Patient requested refill [place] Verified LR: 07/17/2019 Contract updated Pended Rx and sent to [facility] for approval.

## 2019-09-12 ENCOUNTER — Other Ambulatory Visit: Payer: Self-pay | Admitting: Nurse Practitioner

## 2019-09-12 DIAGNOSIS — IMO0002 Reserved for concepts with insufficient information to code with codable children: Secondary | ICD-10-CM

## 2019-09-16 ENCOUNTER — Telehealth: Payer: Self-pay

## 2019-09-16 ENCOUNTER — Ambulatory Visit (INDEPENDENT_AMBULATORY_CARE_PROVIDER_SITE_OTHER): Payer: Medicare Other | Admitting: Nurse Practitioner

## 2019-09-16 ENCOUNTER — Other Ambulatory Visit: Payer: Self-pay

## 2019-09-16 ENCOUNTER — Encounter: Payer: Self-pay | Admitting: Nurse Practitioner

## 2019-09-16 DIAGNOSIS — Z0000 Encounter for general adult medical examination without abnormal findings: Secondary | ICD-10-CM | POA: Diagnosis not present

## 2019-09-16 NOTE — Progress Notes (Signed)
Subjective:   Douglas Soto is a 79 y.o. male who presents for Medicare Annual/Subsequent preventive examination.  Review of Systems:   Cardiac Risk Factors include: hypertension;diabetes mellitus;dyslipidemia;obesity (BMI >30kg/m2);male gender     Objective:    Vitals: There were no vitals taken for this visit.  There is no height or weight on file to calculate BMI.  Advanced Directives 09/16/2019 01/03/2019 09/13/2018 08/20/2018 02/19/2018 02/19/2018 01/01/2018  Does Patient Have a Medical Advance Directive? Yes Yes Yes No Yes No;Yes No  Type of Advance Directive Healthcare Power of [place] will Living will -  Does patient want to make changes to medical advance directive? No - Patient declined No - Patient declined - No - Patient declined No - Patient declined - -  Copy of [place] in Chart? No - copy requested No - copy requested No - copy requested - - - -  Would patient like information on creating a medical advance directive? - - - - No - Patient declined No - Patient declined -    Tobacco Social History   Tobacco Use  Smoking Status Former Smoker  . Quit date: 04/17/1972  . Years since quitting: 47.4  Smokeless Tobacco Current User  . Types: Chew     Ready to quit: Not Answered Counseling given: Not Answered   Clinical Intake:  Pre-visit preparation completed: Yes  Pain : No/denies pain     BMI - recorded: 35 Nutritional Status: BMI > 30  Obese Nutritional Risks: None Diabetes: Yes  How often do you need to have someone help you when you read instructions, pamphlets, or other written materials from your doctor or pharmacy?: 1 - Never        Past Medical History:  Diagnosis Date  . Cholelithiasis [id number]   uncomplicated.    . Chronic kidney disease, stage II (mild)   . Depression   . Diabetes mellitus without complication ([place])   . Essential hypertension,  malignant   . Fatty liver 02/2018   on ultrasound  . Gout, unspecified   . Hyperlipidemia   . Lumbago   . Obesity, unspecified   . Pain in joint, pelvic region and thigh   . Pleural effusion on right 02/2018   Past Surgical History:  Procedure Laterality Date  . APPENDECTOMY    . COLONOSCOPY WITH PROPOFOL N/A 02/22/2018   Procedure: COLONOSCOPY WITH PROPOFOL;  Surgeon: Milus Banister, MD;  Location: [facility] ENDOSCOPY;  Service: Endoscopy;  Laterality: N/A;  . CYST EXCISION  1998   back  . ESOPHAGOGASTRODUODENOSCOPY (EGD) WITH PROPOFOL N/A 02/22/2018   Procedure: ESOPHAGOGASTRODUODENOSCOPY (EGD) WITH PROPOFOL;  Surgeon: Milus Banister, MD;  Location: [facility] ENDOSCOPY;  Service: Endoscopy;  Laterality: N/A;  . POLYPECTOMY  02/22/2018   Procedure: POLYPECTOMY;  Surgeon: Milus Banister, MD;  Location: [facility] ENDOSCOPY;  Service: Endoscopy;;  . 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 Heart Problems Father   . Heart disease Brother   . Diabetes Brother   . Diabetes Brother   . Heart Problems Mother   . Pneumonia Maternal Grandfather    Social History   Socioeconomic History  . Marital status: Widowed    Spouse name: Not on file  . Number of children: Not on file  . Years of education: Not on file  . Highest education level: Not on file  Occupational History  . Not on file  Tobacco Use  . Smoking status: Former Smoker    Quit date: 04/17/1972    Years since quitting: 47.4  . Smokeless tobacco: Current User    Types: Chew  Substance and Sexual Activity  . Alcohol use: Not Currently    Alcohol/week: 5.0 standard drinks    Types: 5 Cans of beer per week    Comment: Stopped 03/2018   . Drug use: No  . Sexual activity: Not Currently  Other Topics Concern  . Not on file  Social History Narrative   Widow   Former smoker stopped 1974, chews tobacco   Alcohol beer on weekends   Exercise  Walks dog      Social  Determinants of Health   Financial Resource Strain:   . Difficulty of Paying Living Expenses:   Food Insecurity:   . Worried About Charity fundraiser in the Last Year:   . Arboriculturist in the Last Year:   Transportation Needs:   . Film/video editor (Medical):   Marland Kitchen Lack of Transportation (Non-Medical):   Physical Activity:   . Days of Exercise per Week:   . Minutes of Exercise per Session:   Stress:   . Feeling of Stress :   Social Connections:   . Frequency of Communication with Friends and Family:   . Frequency of Social Gatherings with Friends and Family:   . Attends Religious Services:   . Active Member of Clubs or Organizations:   . Attends Archivist Meetings:   Marland Kitchen Marital Status:     Outpatient Encounter Medications as of 09/16/2019  Medication Sig  . doxazosin (CARDURA) 8 MG tablet TAKE 1 TABLET(8 MG) BY MOUTH DAILY  . ELIQUIS 2.5 MG TABS tablet TAKE 1 TABLET(2.5 MG) BY MOUTH TWICE DAILY  . ferrous sulfate 325 (65 FE) MG tablet Take 325 mg by mouth daily with breakfast.   . metoprolol succinate (TOPROL-XL) 50 MG 24 hr tablet TAKE 1 TABLET BY MOUTH EVERY DAY WITH OR IMMEDIATELY FOLLOWING A MEAL  . multivitamin-iron-minerals-folic acid (CENTRUM) chewable tablet Chew 1 tablet by mouth daily.  . Olmesartan-amLODIPine-HCTZ 20-5-12.5 MG TABS TAKE 1 TABLET BY MOUTH DAILY  . rosuvastatin (CRESTOR) 5 MG tablet TAKE 1 TABLET BY MOUTH EVERY DAY  . [DISCONTINUED] HYDROcodone-acetaminophen (NORCO/VICODIN) 5-325 MG tablet Take 0.5 tablets by mouth daily as needed for moderate pain. Continuing to work on reducing medication as discussed  . [DISCONTINUED] metFORMIN (GLUCOPHAGE) 1000 MG tablet TAKE 1 TABLET BY MOUTH TWICE DAILY WITH A MEAL   No facility-administered encounter medications on file as of 09/16/2019.    Activities of Daily Living In your present state of health, do you have any difficulty performing the following activities: 09/16/2019  Hearing? Y  Vision? N    Difficulty concentrating or making decisions? N  Walking or climbing stairs? N  Dressing or bathing? N  Doing errands, shopping? N  Preparing Food and eating ? N  Using the Toilet? N  In the past six months, have you accidently leaked urine? N  Do you have problems with loss of bowel control? N  Managing your Medications? N  Managing your Finances? N  Housekeeping or managing your Housekeeping? N  Some recent data might be hidden    Patient Care Team: Lauree Chandler, NP as PCP - General (Geriatric Medicine)   Assessment:   This is a routine wellness examination for Douglas Soto.  Exercise Activities and Dietary recommendations  Goals    . DIET - EAT MORE FRUITS AND VEGETABLES     Overall would like to eat better, more fresh fruits and vegetables.        Fall Risk Fall Risk  09/16/2019 05/09/2019 03/24/2019 01/03/2019 09/13/2018  Falls in the past year? 0 0 0 0 0  Number falls in past yr: 0 0 0 0 0  Injury with Fall? 0 0 0 0 0   Is the patient's home free of loose throw rugs in walkways, pet beds, electrical cords, etc?   yes      Grab bars in the bathroom? no      Handrails on the stairs?   no      Adequate lighting?   yes  Timed Get Up and Go Performed: na  Depression Screen PHQ 2/9 Scores 09/16/2019 09/13/2018 08/30/2018 08/20/2018  PHQ - 2 Score 0 0 0 0    Cognitive Function MMSE - Mini Mental State Exam 12/01/2015  Not completed: Refused     6CIT Screen 09/16/2019 09/13/2018  What Year? (No Data) 4 points  What month? - 0 points  What time? - 0 points  Count back from 20 - 0 points  Months in reverse - 4 points  Repeat phrase - 4 points  Total Score - 12    Immunization History  Administered Date(s) Administered  . Fluad Quad(high Dose 65+) 01/03/2019  . Influenza-Unspecified 02/16/2010  . Pneumococcal Conjugate-13 11/16/2016  . Pneumococcal-Unspecified 04/17/2008  . Td 06/16/2002, 04/01/2018  . Zoster 11/07/2011    Qualifies for Shingles Vaccine? Yes,    Screening Tests Health Maintenance  Topic Date Due  . OPHTHALMOLOGY EXAM  Never done  . COVID-19 Vaccine (1) Never done  . HEMOGLOBIN A1C  07/03/2019  . INFLUENZA VACCINE  11/16/2019  . FOOT EXAM  01/03/2020  . TETANUS/TDAP  04/01/2028  . PNA vac Low Risk Adult  Completed   Cancer Screenings: Lung: Low Dose CT Chest recommended if Age 1-80 years, 30 pack-year currently smoking OR have quit w/in 15years. Patient does not qualify. Colorectal: up to date  Additional Screenings:  Hepatitis C Screening:na      Plan:     I have personally reviewed and noted the following in the patient's chart:   . Medical and social history . Use of alcohol, tobacco or illicit drugs  . Current medications and supplements . Functional ability and status . Nutritional status . Physical activity . Advanced directives . List of other physicians . Hospitalizations, surgeries, and ER visits in previous 12 months . Vitals . Screenings to include cognitive, depression, and falls . Referrals and appointments  In addition, I have reviewed and discussed with patient certain preventive protocols, quality metrics, and best practice recommendations. A written personalized care plan for preventive services as well as general preventive health recommendations were provided to patient.     Lauree Chandler, NP  09/16/2019

## 2019-09-16 NOTE — Patient Instructions (Signed)
Douglas Soto , Thank you for taking time to come for your Medicare Wellness Visit. I appreciate your ongoing commitment to your health goals. Please review the following plan we discussed and let me know if I can assist you in the future.   Screening recommendations/referrals: Colonoscopy up to date Recommended yearly ophthalmology/optometry visit for glaucoma screening and checkup Recommended yearly dental visit for hygiene and checkup  Vaccinations: Influenza vaccine up to date Pneumococcal vaccine up to date Tdap vaccine up to date Shingles vaccine recommended to get shingrix at your local pharmacy.    Advanced directives: on file.  Conditions/risks identified: diabetes, hypertension, high cholesterol, advanced age  Next appointment: 1 year  Preventive Care 61 Years and Older, Male Preventive care refers to lifestyle choices and visits with your health care provider that can promote health and wellness. What does preventive care include?  A yearly physical exam. This is also called an annual well check.  Dental exams once or twice a year.  Routine eye exams. Ask your health care provider how often you should have your eyes checked.  Personal lifestyle choices, including:  Daily care of your teeth and gums.  Regular physical activity.  Eating a healthy diet.  Avoiding tobacco and drug use.  Limiting alcohol use.  Practicing safe sex.  Taking low doses of aspirin every day.  Taking vitamin and mineral supplements as recommended by your health care provider. What happens during an annual well check? The services and screenings done by your health care provider during your annual well check will depend on your age, overall health, lifestyle risk factors, and family history of disease. Counseling  Your health care provider may ask you questions about your:  Alcohol use.  Tobacco use.  Drug use.  Emotional well-being.  Home and relationship well-being.  Sexual  activity.  Eating habits.  History of falls.  Memory and ability to understand (cognition).  Work and work Statistician. Screening  You may have the following tests or measurements:  Height, weight, and BMI.  Blood pressure.  Lipid and cholesterol levels. These may be checked every 5 years, or more frequently if you are over 59 years old.  Skin check.  Lung cancer screening. You may have this screening every year starting at age 55 if you have a 30-pack-year history of smoking and currently smoke or have quit within the past 15 years.  Fecal occult blood test (FOBT) of the stool. You may have this test every year starting at age 41.  Flexible sigmoidoscopy or colonoscopy. You may have a sigmoidoscopy every 5 years or a colonoscopy every 10 years starting at age 25.  Prostate cancer screening. Recommendations will vary depending on your family history and other risks.  Hepatitis C blood test.  Hepatitis B blood test.  Sexually transmitted disease (STD) testing.  Diabetes screening. This is done by checking your blood sugar (glucose) after you have not eaten for a while (fasting). You may have this done every 1-3 years.  Abdominal aortic aneurysm (AAA) screening. You may need this if you are a current or former smoker.  Osteoporosis. You may be screened starting at age 42 if you are at high risk. Talk with your health care provider about your test results, treatment options, and if necessary, the need for more tests. Vaccines  Your health care provider may recommend certain vaccines, such as:  Influenza vaccine. This is recommended every year.  Tetanus, diphtheria, and acellular pertussis (Tdap, Td) vaccine. You may need a Td  booster every 10 years.  Zoster vaccine. You may need this after age 38.  Pneumococcal 13-valent conjugate (PCV13) vaccine. One dose is recommended after age 12.  Pneumococcal polysaccharide (PPSV23) vaccine. One dose is recommended after age  53. Talk to your health care provider about which screenings and vaccines you need and how often you need them. This information is not intended to replace advice given to you by your health care provider. Make sure you discuss any questions you have with your health care provider. Document Released: 04/30/2015 Document Revised: 12/22/2015 Document Reviewed: 02/02/2015 Elsevier Interactive Patient Education  2017 [place] Prevention in the Home Falls can cause injuries. They can happen to people of all ages. There are many things you can do to make your home safe and to help prevent falls. What can I do on the outside of my home?  Regularly fix the edges of walkways and driveways and fix any cracks.  Remove anything that might make you trip as you walk through a door, such as a raised step or threshold.  Trim any bushes or trees on the path to your home.  Use bright outdoor lighting.  Clear any walking paths of anything that might make someone trip, such as rocks or tools.  Regularly check to see if handrails are loose or broken. Make sure that both sides of any steps have handrails.  Any raised decks and porches should have guardrails on the edges.  Have any leaves, snow, or ice cleared regularly.  Use sand or salt on walking paths during winter.  Clean up any spills in your garage right away. This includes oil or grease spills. What can I do in the bathroom?  Use night lights.  Install grab bars by the toilet and in the tub and shower. Do not use towel bars as grab bars.  Use non-skid mats or decals in the tub or shower.  If you need to sit down in the shower, use a plastic, non-slip stool.  Keep the floor dry. Clean up any water that spills on the floor as soon as it happens.  Remove soap buildup in the tub or shower regularly.  Attach bath mats securely with double-sided non-slip rug tape.  Do not have throw rugs and other things on the floor that can make  you trip. What can I do in the bedroom?  Use night lights.  Make sure that you have a light by your bed that is easy to reach.  Do not use any sheets or blankets that are too big for your bed. They should not hang down onto the floor.  Have a firm chair that has side arms. You can use this for support while you get dressed.  Do not have throw rugs and other things on the floor that can make you trip. What can I do in the kitchen?  Clean up any spills right away.  Avoid walking on wet floors.  Keep items that you use a lot in easy-to-reach places.  If you need to reach something above you, use a strong step stool that has a grab bar.  Keep electrical cords out of the way.  Do not use floor polish or wax that makes floors slippery. If you must use wax, use non-skid floor wax.  Do not have throw rugs and other things on the floor that can make you trip. What can I do with my stairs?  Do not leave any items on the stairs.  Make sure that there are handrails on both sides of the stairs and use them. Fix handrails that are broken or loose. Make sure that handrails are as long as the stairways.  Check any carpeting to make sure that it is firmly attached to the stairs. Fix any carpet that is loose or worn.  Avoid having throw rugs at the top or bottom of the stairs. If you do have throw rugs, attach them to the floor with carpet tape.  Make sure that you have a light switch at the top of the stairs and the bottom of the stairs. If you do not have them, ask someone to add them for you. What else can I do to help prevent falls?  Wear shoes that:  Do not have high heels.  Have rubber bottoms.  Are comfortable and fit you well.  Are closed at the toe. Do not wear sandals.  If you use a stepladder:  Make sure that it is fully opened. Do not climb a closed stepladder.  Make sure that both sides of the stepladder are locked into place.  Ask someone to hold it for you, if  possible.  Clearly mark and make sure that you can see:  Any grab bars or handrails.  First and last steps.  Where the edge of each step is.  Use tools that help you move around (mobility aids) if they are needed. These include:  Canes.  Walkers.  Scooters.  Crutches.  Turn on the lights when you go into a dark area. Replace any light bulbs as soon as they burn out.  Set up your furniture so you have a clear path. Avoid moving your furniture around.  If any of your floors are uneven, fix them.  If there are any pets around you, be aware of where they are.  Review your medicines with your doctor. Some medicines can make you feel dizzy. This can increase your chance of falling. Ask your doctor what other things that you can do to help prevent falls. This information is not intended to replace advice given to you by your health care provider. Make sure you discuss any questions you have with your health care provider. Document Released: 01/28/2009 Document Revised: 09/09/2015 Document Reviewed: 05/08/2014 Elsevier Interactive Patient Education  2017 [employer].

## 2019-09-16 NOTE — Progress Notes (Signed)
This service is provided via telemedicine  No vital signs collected/recorded due to the encounter was a telemedicine visit.   Location of patient (ex: home, work):  Home  Patient consents to a telephone visit:  Yes, see telephone encounter dated 09/16/2019  Location of the provider (ex: office, home):  [place]  Name of any referring provider:  N/A  Names of all persons participating in the telemedicine service and their role in the encounter:  Sherrie Mustache, Nurse Practitioner, Carroll Kinds, CMA, and patient.    Time spent on call:  7 minutes with medical assistant

## 2019-09-16 NOTE — Telephone Encounter (Signed)
Mr. dkari, dornfeld are scheduled for a virtual visit with your provider today.    Just as we do with appointments in the office, we must obtain your consent to participate.  Your consent will be active for this visit and any virtual visit you may have with one of our providers in the next 365 days.    If you have a MyChart account, I can also send a copy of this consent to you electronically.  All virtual visits are billed to your insurance company just like a traditional visit in the office.  As this is a virtual visit, video technology does not allow for your provider to perform a traditional examination.  This may limit your provider's ability to fully assess your condition.  If your provider identifies any concerns that need to be evaluated in person or the need to arrange testing such as labs, EKG, etc, we will make arrangements to do so.    Although advances in technology are sophisticated, we cannot ensure that it will always work on either your end or our end.  If the connection with a video visit is poor, we may have to switch to a telephone visit.  With either a video or telephone visit, we are not always able to ensure that we have a secure connection.   I need to obtain your verbal consent now.   Are you willing to proceed with your visit today?   OBERYN BOUYEA has provided verbal consent on 09/16/2019 for a virtual visit (video or telephone).   Carroll Kinds, CMA 09/16/2019  1:18 PM

## 2019-09-23 ENCOUNTER — Other Ambulatory Visit: Payer: Self-pay | Admitting: Nurse Practitioner

## 2019-09-23 DIAGNOSIS — E0822 Diabetes mellitus due to underlying condition with diabetic chronic kidney disease: Secondary | ICD-10-CM

## 2019-09-23 DIAGNOSIS — N183 Chronic kidney disease, stage 3 unspecified: Secondary | ICD-10-CM

## 2019-09-23 DIAGNOSIS — I4821 Permanent atrial fibrillation: Secondary | ICD-10-CM

## 2019-09-23 DIAGNOSIS — E785 Hyperlipidemia, unspecified: Secondary | ICD-10-CM

## 2019-09-23 DIAGNOSIS — N1832 Chronic kidney disease, stage 3b: Secondary | ICD-10-CM

## 2019-09-26 ENCOUNTER — Other Ambulatory Visit: Payer: Self-pay

## 2019-09-26 ENCOUNTER — Other Ambulatory Visit: Payer: Medicare Other

## 2019-09-26 DIAGNOSIS — N183 Chronic kidney disease, stage 3 unspecified: Secondary | ICD-10-CM | POA: Diagnosis not present

## 2019-09-26 DIAGNOSIS — I4821 Permanent atrial fibrillation: Secondary | ICD-10-CM | POA: Diagnosis not present

## 2019-09-26 DIAGNOSIS — N1832 Long term (current) use of insulin: Secondary | ICD-10-CM

## 2019-09-26 DIAGNOSIS — E785 Hyperlipidemia, unspecified: Secondary | ICD-10-CM | POA: Diagnosis not present

## 2019-09-26 DIAGNOSIS — E0821 Diabetes mellitus due to underlying condition with diabetic nephropathy: Secondary | ICD-10-CM | POA: Diagnosis not present

## 2019-09-26 DIAGNOSIS — E0822 Diabetes mellitus due to underlying condition with diabetic chronic kidney disease: Secondary | ICD-10-CM

## 2019-09-26 DIAGNOSIS — Z794 Long term (current) use of insulin: Secondary | ICD-10-CM | POA: Diagnosis not present

## 2019-09-27 LAB — CBC WITH DIFFERENTIAL/PLATELET
Absolute Monocytes: 628 cells/uL (ref 200–950)
Basophils Absolute: 73 cells/uL (ref 0–200)
Basophils Relative: 0.8 %
Eosinophils Absolute: 355 cells/uL (ref 15–500)
Eosinophils Relative: 3.9 %
HCT: 48.8 % (ref 38.5–50.0)
Hemoglobin: 16.1 g/dL (ref 13.2–17.1)
Lymphs Abs: 1966 cells/uL (ref 850–3900)
MCH: 29.7 pg (ref 27.0–33.0)
MCHC: 33 g/dL (ref 32.0–36.0)
MCV: 90 fL (ref 80.0–100.0)
MPV: 9.8 fL (ref 7.5–12.5)
Monocytes Relative: 6.9 %
Neutro Abs: 6079 cells/uL (ref 1500–7800)
Neutrophils Relative %: 66.8 %
Platelets: 205 10*3/uL (ref 140–400)
RBC: 5.42 10*6/uL (ref 4.20–5.80)
RDW: 12.9 % (ref 11.0–15.0)
Total Lymphocyte: 21.6 %
WBC: 9.1 10*3/uL (ref 3.8–10.8)

## 2019-09-27 LAB — COMPLETE METABOLIC PANEL WITH GFR
AG Ratio: 1.5 (calc) (ref 1.0–2.5)
ALT: 10 U/L (ref 9–46)
AST: 11 U/L (ref 10–35)
Albumin: 4 g/dL (ref 3.6–5.1)
Alkaline phosphatase (APISO): 72 U/L (ref 35–144)
BUN/Creatinine Ratio: 12 (calc) (ref 6–22)
BUN: 22 mg/dL (ref 7–25)
CO2: 32 mmol/L (ref 20–32)
Calcium: 10.3 mg/dL (ref 8.6–10.3)
Chloride: 97 mmol/L — ABNORMAL LOW (ref 98–110)
Creat: 1.77 mg/dL — ABNORMAL HIGH (ref 0.70–1.18)
GFR, Est African American: 42 mL/min/{1.73_m2} — ABNORMAL LOW (ref >=60)
GFR, Est Non African American: 36 mL/min/{1.73_m2} — ABNORMAL LOW (ref >=60)
Globulin: 2.7 g/dL (calc) (ref 1.9–3.7)
Glucose, Bld: 191 mg/dL — ABNORMAL HIGH (ref 65–99)
Potassium: 3.9 mmol/L (ref 3.5–5.3)
Sodium: 141 mmol/L (ref 135–146)
Total Bilirubin: 0.9 mg/dL (ref 0.2–1.2)
Total Protein: 6.7 g/dL (ref 6.1–8.1)

## 2019-09-27 LAB — LIPID PANEL
Cholesterol: 159 mg/dL (ref <200)
HDL: 38 mg/dL — ABNORMAL LOW (ref >=40)
LDL Cholesterol (Calc): 91 mg/dL (calc)
Non-HDL Cholesterol (Calc): 121 mg/dL (calc) (ref <130)
Total CHOL/HDL Ratio: 4.2 (calc) (ref <5.0)
Triglycerides: 199 mg/dL — ABNORMAL HIGH (ref <150)

## 2019-09-27 LAB — HEMOGLOBIN A1C
Hgb A1c MFr Bld: 9 % of total Hgb — ABNORMAL HIGH (ref <5.7)
Mean Plasma Glucose: 212 (calc)
eAG (mmol/L): 11.7 (calc)

## 2019-10-01 ENCOUNTER — Ambulatory Visit: Payer: Medicare Other | Admitting: Nurse Practitioner

## 2019-10-10 ENCOUNTER — Ambulatory Visit: Payer: Medicare Other | Admitting: Nurse Practitioner

## 2019-10-15 ENCOUNTER — Ambulatory Visit (INDEPENDENT_AMBULATORY_CARE_PROVIDER_SITE_OTHER): Payer: Medicare Other | Admitting: Nurse Practitioner

## 2019-10-15 ENCOUNTER — Encounter: Payer: Self-pay | Admitting: Nurse Practitioner

## 2019-10-15 ENCOUNTER — Other Ambulatory Visit: Payer: Self-pay

## 2019-10-15 VITALS — BP 176/82 | HR 86 | Temp 97.3°F | Ht 68.0 in | Wt 235.0 lb

## 2019-10-15 DIAGNOSIS — I509 Heart failure, unspecified: Secondary | ICD-10-CM

## 2019-10-15 DIAGNOSIS — D649 Anemia, unspecified: Secondary | ICD-10-CM

## 2019-10-15 DIAGNOSIS — I10 Essential (primary) hypertension: Secondary | ICD-10-CM

## 2019-10-15 DIAGNOSIS — N1832 Chronic kidney disease, stage 3b: Secondary | ICD-10-CM

## 2019-10-15 DIAGNOSIS — E1129 Type 2 diabetes mellitus with other diabetic kidney complication: Secondary | ICD-10-CM | POA: Diagnosis not present

## 2019-10-15 DIAGNOSIS — E785 Hyperlipidemia, unspecified: Secondary | ICD-10-CM

## 2019-10-15 DIAGNOSIS — Z6836 Body mass index (BMI) 36.0-36.9, adult: Secondary | ICD-10-CM

## 2019-10-15 DIAGNOSIS — I4821 Permanent atrial fibrillation: Secondary | ICD-10-CM

## 2019-10-15 DIAGNOSIS — H6123 Impacted cerumen, bilateral: Secondary | ICD-10-CM

## 2019-10-15 DIAGNOSIS — E1165 Type 2 diabetes mellitus with hyperglycemia: Secondary | ICD-10-CM

## 2019-10-15 DIAGNOSIS — IMO0002 Reserved for concepts with insufficient information to code with codable children: Secondary | ICD-10-CM

## 2019-10-15 MED ORDER — GLIPIZIDE 5 MG PO TABS: 5.0000 mg | ORAL_TABLET | Freq: Every day | ORAL | 0 refills | Status: DC

## 2019-10-15 NOTE — Patient Instructions (Addendum)
Follow up in 3 months, labs before visit  To change diet for diabetes and high cholesterol  Increase your hydration (water) and to avoid NSAIDS (Aleve, Advil, Motrin, Ibuprofen)   Stop iron, we will follow up blood count at next visit.   Start glipizide 5 mg daily with breakfast

## 2019-10-15 NOTE — Progress Notes (Signed)
Careteam: Patient Care Team: Lauree Chandler, NP as PCP - General (Geriatric Medicine)  PLACE OF SERVICE:  [place] Directive information Does Patient Have a Medical Advance Directive?: Yes, Type of Advance Directive: [place];Living will, Does patient want to make changes to medical advance directive?: No - Patient declined  Allergies  Allergen Reactions  . Allopurinol Other (See Comments)    Whelps on body and sores in mouth     Chief Complaint  Patient presents with  . Medical Management of Chronic Issues    6 month follow-up   . Best Practice Recommendations    Discuss need for Hep C screening   . Quality Metric Gaps    Discuss need for eye exam   . Immunizations    Patient refused covid 19 vaccine recommendation      HPI: Patient is a 79 y.o. male for routine follow up.   Chronic back pain- last got refill on hydrocodone-apap in April. Doing well. Not taking anything for pain.    DM- reports he is taking jardiance 25 mg daily for diabetes  A1c is now at 9.0. reports he does not eat right.  Does not check blood sugars.  Eating a lot of pasta, pizza, bread.   Hyperlipidemia- triglycerides elevated, LDL at goal at 91, on crestor 5 mg   Anemia- resolved. Continues on iron supplement.   ckd stage 3- ongoing.   htn- bp high today- "feels like this place makes it go up" at home generally in the 130s/70s and takes about twice weekly.   A fib- reports compliance with eliquis however we have not refilled since 09/18/18 and it was 60 #5 refills. States he has not been getting from any other source. Follows with Dr Martinique ,due to follow up with cardiologist.   Review of Systems:  Review of Systems  Constitutional: Negative for chills, fever and weight loss.  HENT: Negative for hearing loss.   Eyes: Negative for blurred vision.  Respiratory: Negative for cough, sputum production and shortness of breath.   Cardiovascular: Negative for  chest pain, palpitations and leg swelling.  Gastrointestinal: Negative for abdominal pain, constipation, diarrhea and heartburn.  Genitourinary: Negative for dysuria, frequency and urgency.  Musculoskeletal: Negative for back pain, falls, joint pain and myalgias.  Skin: Negative.   Neurological: Negative for dizziness, tingling, sensory change and headaches.  Psychiatric/Behavioral: Negative for depression and memory loss. The patient is not nervous/anxious and does not have insomnia.     Past Medical History:  Diagnosis Date  . Cholelithiasis 28/3662   uncomplicated.    . Chronic kidney disease, stage II (mild)   . Depression   . Diabetes mellitus without complication ([place])   . Essential hypertension, malignant   . Fatty liver 02/2018   on ultrasound  . Gout, unspecified   . Hyperlipidemia   . Lumbago   . Obesity, unspecified   . Pain in joint, pelvic region and thigh   . Pleural effusion on right 02/2018   Past Surgical History:  Procedure Laterality Date  . APPENDECTOMY    . COLONOSCOPY WITH PROPOFOL N/A 02/22/2018   Procedure: COLONOSCOPY WITH PROPOFOL;  Surgeon: Milus Banister, MD;  Location[facility] ENDOSCOPY;  Service: Endoscopy;  Laterality: N/A;  . CYST EXCISION  1998   back  . ESOPHAGOGASTRODUODENOSCOPY (EGD) WITH PROPOFOL N/A 02/22/2018   Procedure: ESOPHAGOGASTRODUODENOSCOPY (EGD) WITH PROPOFOL;  Surgeon: Milus Banister, MD;  Location: [facility] ENDOSCOPY;  Service: Endoscopy;  Laterality:  N/A;  . POLYPECTOMY  02/22/2018   Procedure: POLYPECTOMY;  Surgeon: Milus Banister, MD;  Location[facility] ENDOSCOPY;  Service: Endoscopy;;  . 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 His smokeless tobacco use includes chew. He reports previous alcohol use of about 5.0 standard drinks of alcohol per week. He reports that he does not use drugs.  Family History  Problem Relation Age of Onset  . Heart  Problems Father   . Heart disease Brother   . Diabetes Brother   . Diabetes Brother   . Heart Problems Mother   . Pneumonia Maternal Grandfather     Medications: Patient's Medications  New Prescriptions   No medications on file  Previous Medications   DOXAZOSIN (CARDURA) 8 MG TABLET    TAKE 1 TABLET(8 MG) BY MOUTH DAILY   ELIQUIS 2.5 MG TABS TABLET    TAKE 1 TABLET(2.5 MG) BY MOUTH TWICE DAILY   FERROUS SULFATE 325 (65 FE) MG TABLET    Take 325 mg by mouth daily with breakfast.    METOPROLOL SUCCINATE (TOPROL-XL) 50 MG 24 HR TABLET    TAKE 1 TABLET BY MOUTH EVERY DAY WITH OR IMMEDIATELY FOLLOWING A MEAL   MULTIVITAMIN-IRON-MINERALS-FOLIC ACID (CENTRUM) CHEWABLE TABLET    Chew 1 tablet by mouth daily.   OLMESARTAN-AMLODIPINE-HCTZ 20-5-12.5 MG TABS    TAKE 1 TABLET BY MOUTH DAILY   ROSUVASTATIN (CRESTOR) 5 MG TABLET    TAKE 1 TABLET BY MOUTH EVERY DAY  Modified Medications   No medications on file  Discontinued Medications   No medications on file    Physical Exam:  Vitals:   10/15/19 1449  BP: (!) 176/82  Pulse: 86  Temp: (!) 97.3 F (36.3 C)  TempSrc: Temporal  SpO2: 95%  Weight: 235 lb (106.6 kg)  Height: 5' 8"  (1.727 m)   Body mass index is 35.73 kg/m. Wt Readings from Last 3 Encounters:  10/15/19 235 lb (106.6 kg)  03/24/19 229 lb (103.9 kg)  01/03/19 216 lb (98 kg)    Physical Exam Constitutional:      General: He is not in acute distress.    Appearance: He is well-developed. He is not diaphoretic.  HENT:     Head: Normocephalic and atraumatic.     Right Ear: There is impacted cerumen.     Left Ear: There is impacted cerumen.     Mouth/Throat:     Pharynx: No oropharyngeal exudate.  Eyes:     Conjunctiva/sclera: Conjunctivae normal.     Pupils: Pupils are equal, round, and reactive to light.  Cardiovascular:     Rate and Rhythm: Normal rate and regular rhythm.     Heart sounds: Normal heart sounds.  Pulmonary:     Effort: Pulmonary effort is normal.      Breath sounds: Normal breath sounds.  Abdominal:     General: Bowel sounds are normal.     Palpations: Abdomen is soft.  Musculoskeletal:        General: No tenderness.     Cervical back: Normal range of motion and neck supple.  Skin:    General: Skin is warm and dry.  Neurological:     Mental Status: He is alert and oriented to person, place, and time.     Labs reviewed: Basic Metabolic Panel: Recent Labs    01/03/19 1356 09/26/19 1331  NA 140 141  K 4.3 3.9  CL 98 97*  CO2 33* 32  GLUCOSE 146* 191*  BUN 24 22  CREATININE 1.62* 1.77*  CALCIUM 10.1 10.3   Liver Function Tests: Recent Labs    01/03/19 1356 09/26/19 1331  AST 13 11  ALT 11 10  BILITOT 0.9 0.9  PROT 6.6 6.7   No results for input(s): LIPASE, AMYLASE in the last 8760 hours. No results for input(s): AMMONIA in the last 8760 hours. CBC: Recent Labs    01/03/19 1356 09/26/19 1331  WBC 8.4 9.1  NEUTROABS 5,695 6,079  HGB 14.1 16.1  HCT 42.5 48.8  MCV 88.5 90.0  PLT 218 205   Lipid Panel: Recent Labs    01/03/19 1356 09/26/19 1331  CHOL 125 159  HDL 52 38*  LDLCALC 53 91  TRIG 116 199*  CHOLHDL 2.4 4.2   TSH: No results for input(s): TSH in the last 8760 hours. A1C: Lab Results  Component Value Date   HGBA1C 9.0 (H) 09/26/2019     Assessment/Plan 1. Permanent atrial fibrillation (HCC) Rate controlled, continues on eliquis for anticoagulation, states compliance with twice daily medication. Continues on metoprolol succinate daily for rate control.   2. Essential hypertension Improved to 140/68 while in office, will continue current regimen, encouraged dietary modifications.  - CBC with Differential/Platelet; Future - CMP with eGFR([facility]); Future  3. DM type 2, uncontrolled, with renal complications ([place]) -discussed in detail about making dietary modifications, he is in agreement with this. Discuss possible referral to endocrinology but he would like to hold off at this  time.  -Encouraged dietary compliance, routine foot care/monitoring and to keep up with diabetic eye exams through ophthalmology  - Hemoglobin A1c; Future -to stop jardiance due to CKD stage 3, will start glipizide and monitor closely for hypoglycemia- education provided today - glipiZIDE (GLUCOTROL) 5 MG tablet; Take 1 tablet (5 mg total) by mouth daily before breakfast.  Dispense: 90 tablet; Refill: 0  4. Stage 3b chronic kidney disease -Encourage proper hydration and to avoid NSAIDS (Aleve, Advil, Motrin, Ibuprofen); states he does not take NSAIDs - CMP with eGFR([facility]); Future  5. Congestive heart failure, unspecified HF chronicity, unspecified heart failure type ([place]) euvolemic at this time. Continues on metoprolol, olmesartan-amlodipine-hctz  6. Class 2 severe obesity due to excess calories with serious comorbidity and body mass index (BMI) of 36.0 to 36.9 in adult [facility]) Discussed need for dietary modifications, he is in agreement and will work on diet.   7. Anemia, unspecified type Improved, will have him stop iron at this time and follow up lab in 3 months.  - CBC with Differential/Platelet; Future  8. Hyperlipidemia LDL goal <70 -not at goal. Discussed increase in medication and diet modifications, he would like to change diet first and reassess in 3 months. - CMP with eGFR([facility]); Future - Lipid Panel; Future  9. Bilateral impacted cerumen Lavaged ears bilaterally and Removed with grabber to left ear, attempted right but unable to get wax (too far back), encouraged debrox and follow up in office to reassess.   Next appt: 3 months, labs prior Kashtyn Jankowski K. [place], [facility] [phone]

## 2019-10-19 ENCOUNTER — Other Ambulatory Visit: Payer: Self-pay | Admitting: Nurse Practitioner

## 2019-10-19 DIAGNOSIS — E7800 Pure hypercholesterolemia, unspecified: Secondary | ICD-10-CM

## 2019-10-31 ENCOUNTER — Other Ambulatory Visit: Payer: Self-pay | Admitting: *Deleted

## 2019-10-31 DIAGNOSIS — I509 Heart failure, unspecified: Secondary | ICD-10-CM

## 2019-10-31 DIAGNOSIS — I480 Paroxysmal atrial fibrillation: Secondary | ICD-10-CM

## 2019-10-31 DIAGNOSIS — E7800 Pure hypercholesterolemia, unspecified: Secondary | ICD-10-CM

## 2019-10-31 DIAGNOSIS — IMO0002 Reserved for concepts with insufficient information to code with codable children: Secondary | ICD-10-CM

## 2019-10-31 DIAGNOSIS — I10 Essential (primary) hypertension: Secondary | ICD-10-CM

## 2019-10-31 MED ORDER — DOXAZOSIN MESYLATE 8 MG PO TABS: ORAL_TABLET | ORAL | 1 refills | Status: DC

## 2019-10-31 MED ORDER — GLIPIZIDE 5 MG PO TABS: 5.0000 mg | ORAL_TABLET | Freq: Every day | ORAL | 1 refills | Status: DC

## 2019-10-31 MED ORDER — METOPROLOL SUCCINATE ER 50 MG PO TB24: ORAL_TABLET | ORAL | 1 refills | Status: DC

## 2019-10-31 MED ORDER — ROSUVASTATIN CALCIUM 5 MG PO TABS: 5.0000 mg | ORAL_TABLET | Freq: Every day | ORAL | 1 refills | Status: DC

## 2019-10-31 NOTE — Telephone Encounter (Signed)
Received refill Request from [facility].

## 2019-11-03 ENCOUNTER — Telehealth: Payer: Self-pay

## 2019-11-03 NOTE — Telephone Encounter (Signed)
Patient called to say he is changing his pharmacy from [facility] to a mail order one [facility] and that they would be contacting us as far as his refills go I asked him if he  Knew which one it was I would change it for him he did not he said he might stop by the office to give [place] the information as to exctaly which one they are

## 2019-11-04 NOTE — Telephone Encounter (Signed)
This encounter was created in error - please disregard.

## 2019-11-17 ENCOUNTER — Other Ambulatory Visit: Payer: Self-pay | Admitting: Nurse Practitioner

## 2019-11-17 DIAGNOSIS — E0822 Diabetes mellitus due to underlying condition with diabetic chronic kidney disease: Secondary | ICD-10-CM

## 2019-11-17 DIAGNOSIS — IMO0002 Reserved for concepts with insufficient information to code with codable children: Secondary | ICD-10-CM

## 2019-12-16 ENCOUNTER — Other Ambulatory Visit: Payer: Self-pay | Admitting: Nurse Practitioner

## 2019-12-16 DIAGNOSIS — I10 Essential (primary) hypertension: Secondary | ICD-10-CM

## 2019-12-19 ENCOUNTER — Telehealth: Payer: Self-pay

## 2019-12-19 NOTE — Telephone Encounter (Signed)
Incoming call received from patient requesting treatment for acute gout attack.  Patient complains of swelling and pain in right big toe x 4 days. I offered patient an appointment today at 3:15 with Marlowe Sax, NP and patient refused.  Patient states I am in so much pain I can not get around.

## 2019-12-19 NOTE — Telephone Encounter (Signed)
I called patient with instructions from [place]. Patient again refused to schedule an appointment for today and states " I have dealt with it this long, I will wait until Tuesday, and get some rest over the weekend."  Patient aware if symptoms progress to seek medical attention at urgent care. Patient verbalized understanding.  Appointment scheduled with Dinah for Tuesday

## 2019-12-19 NOTE — Telephone Encounter (Signed)
Recommend office visit prior to prescribing medication or evaluation in urgent care.

## 2019-12-23 ENCOUNTER — Ambulatory Visit: Payer: Self-pay | Admitting: Family

## 2020-01-02 ENCOUNTER — Telehealth: Payer: Self-pay | Admitting: *Deleted

## 2020-01-02 NOTE — Telephone Encounter (Signed)
Patient called and stated that he DOES NOT want to use [facility]. Stated that they drive him Crazy.  Wants to go back to [facility].   [facility] removed from his preferred pharmacy per patient's request.

## 2020-01-06 ENCOUNTER — Other Ambulatory Visit: Payer: Self-pay | Admitting: Nurse Practitioner

## 2020-01-06 NOTE — Telephone Encounter (Signed)
Spoke with patient and informed him that requested medication is not on his active medication list. Patient states he is taking Amlodipine 5 mg daily, patient states maybe he is not suppose to but he has been. According to patients active medication list he is on a combination pill that contains amlodipine but not on amlodipine 5 mg.  Patient asked that I clarify with Lauree Chandler, NP and follow-up back up with him.    Please review and advise

## 2020-01-06 NOTE — Telephone Encounter (Signed)
I reviewed medication list with patient, see in Epic, yes patient is on combination pill.   Patient states he only had one pill of the amlodipine 5 mg left.   Patient states his b/p at home is about 136/75

## 2020-01-06 NOTE — Telephone Encounter (Signed)
Is he taking the combination pill as well? What have his blood pressures been at home

## 2020-01-08 ENCOUNTER — Other Ambulatory Visit: Payer: Medicare Other

## 2020-01-12 ENCOUNTER — Ambulatory Visit: Payer: Medicare Other | Admitting: Nurse Practitioner

## 2020-01-21 ENCOUNTER — Ambulatory Visit (INDEPENDENT_AMBULATORY_CARE_PROVIDER_SITE_OTHER): Payer: Medicare Other | Admitting: Nurse Practitioner

## 2020-01-21 ENCOUNTER — Telehealth: Payer: Self-pay | Admitting: Nurse Practitioner

## 2020-01-21 ENCOUNTER — Telehealth: Payer: Self-pay

## 2020-01-21 ENCOUNTER — Encounter: Payer: Self-pay | Admitting: Nurse Practitioner

## 2020-01-21 VITALS — BP 142/78 | HR 85 | Temp 97.1°F | Ht 68.0 in | Wt 242.0 lb

## 2020-01-21 DIAGNOSIS — R0602 Shortness of breath: Secondary | ICD-10-CM

## 2020-01-21 DIAGNOSIS — E1129 Type 2 diabetes mellitus with other diabetic kidney complication: Secondary | ICD-10-CM

## 2020-01-21 DIAGNOSIS — I509 Heart failure, unspecified: Secondary | ICD-10-CM

## 2020-01-21 DIAGNOSIS — I10 Essential (primary) hypertension: Secondary | ICD-10-CM

## 2020-01-21 DIAGNOSIS — E785 Hyperlipidemia, unspecified: Secondary | ICD-10-CM | POA: Diagnosis not present

## 2020-01-21 DIAGNOSIS — Z23 Encounter for immunization: Secondary | ICD-10-CM

## 2020-01-21 DIAGNOSIS — R600 Localized edema: Secondary | ICD-10-CM | POA: Diagnosis not present

## 2020-01-21 DIAGNOSIS — IMO0002 Reserved for concepts with insufficient information to code with codable children: Secondary | ICD-10-CM

## 2020-01-21 DIAGNOSIS — E1165 Type 2 diabetes mellitus with hyperglycemia: Secondary | ICD-10-CM

## 2020-01-21 DIAGNOSIS — I4821 Permanent atrial fibrillation: Secondary | ICD-10-CM | POA: Diagnosis not present

## 2020-01-21 NOTE — Telephone Encounter (Signed)
Concerned about the fluid in feet & ankles. Wants to see if can start back on fluid pills to prevent from being hospitalized. Hes diabetic & doesn't want it to get out of hand...  Please advise.  Douglas Soto

## 2020-01-21 NOTE — Patient Instructions (Signed)
Stop amlodipine - to monitor swelling and if it does not improve we will need to follow up  Please take your blood pressure at home for the next 2 weeks after you have taken blood pressure medication and report your blood pressure back to [place]  GOAL is <140/90  Test have been ordered to evaluate your heart and your lungs.   Would recommend keeping your upcoming appt to re-evaluate swelling and blood pressure.

## 2020-01-21 NOTE — Telephone Encounter (Signed)
Per instructions from Lauree Chandler, NP call [facility] and cancel remaining refills on Amlodipine 5 mg   [facility], spoke with Baxter Flattery and canceled all remaining refills on Amlodipine as instructed.

## 2020-01-21 NOTE — Telephone Encounter (Signed)
Would recommend him making an appt for evaluation and to bring all pill bottles of the medication that he is currently taking.

## 2020-01-21 NOTE — Progress Notes (Signed)
Careteam: Patient Care Team: Lauree Chandler, NP as PCP - General (Geriatric Medicine)  PLACE OF SERVICE:  [place]  Advanced Directive information    Allergies  Allergen Reactions   Allopurinol Other (See Comments)    Whelps on body and sores in mouth     Chief Complaint  Patient presents with   Acute Visit    Leg swelling worse x 2-3 weeks. FYI patient only wants to use local pharmacy for refills      HPI: Patient is a 79 y.o. male due to worsen LE edema.  Reports LE for the last 2-3 weeks. He reports he had recently started new medication sent from our office- apparently he has requested refill on amlodipine that we did not have on his medication list, he told our office that he had been taking medication but today he reports that was a NEW prescription for him.   Pt reports  he has been more short of breath with activity recently. Gets short of breath with vacuuming. No chest pains noted. States he has gained weight bc he is very sedentary since he has been taken off his hydrocodone-apap.   htn- blood pressure 138/68 on recheck.   afib- continues on metoprolol 50 mg by mouth daily with eliquis 2.5 mg BID for anticoagulation.   He has not had follow up with cardiologist and Reports he is very "I am not going to spend the rest of my life going back and forth to the doctors" "I would rather be in the ground"   DM-states he does not check blood sugar, taking glipizide 5 mg daily     Review of Systems:  Review of Systems  Constitutional: Negative for chills, fever and weight loss.  HENT: Negative for hearing loss and tinnitus.   Respiratory: Positive for shortness of breath. Negative for cough and sputum production.   Cardiovascular: Positive for palpitations and leg swelling. Negative for chest pain.  Gastrointestinal: Negative for abdominal pain, constipation, diarrhea and heartburn.  Genitourinary: Negative for dysuria, frequency and urgency.    Musculoskeletal: Positive for back pain and myalgias. Negative for falls and joint pain.  Skin: Negative.   Neurological: Negative for dizziness and headaches.    Past Medical History:  Diagnosis Date   Cholelithiasis 91/7915   uncomplicated.     Chronic kidney disease, stage II (mild)    Depression    Diabetes mellitus without complication ([place])    Essential hypertension, malignant    Fatty liver 02/2018   on ultrasound   Gout, unspecified    Hyperlipidemia    Lumbago    Obesity, unspecified    Pain in joint, pelvic region and thigh    Pleural effusion on right 02/2018   Past Surgical History:  Procedure Laterality Date   APPENDECTOMY     COLONOSCOPY WITH PROPOFOL N/A 02/22/2018   Procedure: COLONOSCOPY WITH PROPOFOL;  Surgeon: Milus Banister, MD;  Location: [facility] ENDOSCOPY;  Service: Endoscopy;  Laterality: N/A;   CYST EXCISION  1998   back   ESOPHAGOGASTRODUODENOSCOPY (EGD) WITH PROPOFOL N/A 02/22/2018   Procedure: ESOPHAGOGASTRODUODENOSCOPY (EGD) WITH PROPOFOL;  Surgeon: Milus Banister, MD;  Location: [facility] ENDOSCOPY;  Service: Endoscopy;  Laterality: N/A;   POLYPECTOMY  02/22/2018   Procedure: POLYPECTOMY;  Surgeon: Milus Banister, MD;  Location: [facility] ENDOSCOPY;  Service: Endoscopy;;   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  His smokeless tobacco use includes chew. He reports current alcohol use of about 1.0 standard drink of alcohol per week. He reports that he does not use drugs.  Family History  Problem Relation Age of Onset   Heart Problems Father    Heart disease Brother    Diabetes Brother    Diabetes Brother    Heart Problems Mother    Pneumonia Maternal Grandfather     Medications: Patient's Medications  New Prescriptions   No medications on file  Previous Medications   AMLODIPINE (NORVASC) 5 MG TABLET    TAKE 1 TABLET(5 MG) BY MOUTH DAILY    DOXAZOSIN (CARDURA) 8 MG TABLET    Take one tablet by mouth once daily.   ELIQUIS 2.5 MG TABS TABLET    TAKE 1 TABLET(2.5 MG) BY MOUTH TWICE DAILY   GLIPIZIDE (GLUCOTROL) 5 MG TABLET    Take 1 tablet (5 mg total) by mouth daily before breakfast.   METOPROLOL SUCCINATE (TOPROL-XL) 50 MG 24 HR TABLET    Take one tablet by mouth once daily with or immediately following a meal.   MULTIVITAMIN-IRON-MINERALS-FOLIC ACID (CENTRUM) CHEWABLE TABLET    Chew 1 tablet by mouth daily.   OLMESARTAN-AMLODIPINE-HCTZ 20-5-12.5 MG TABS    TAKE 1 TABLET BY MOUTH DAILY   ROSUVASTATIN (CRESTOR) 5 MG TABLET    Take 1 tablet (5 mg total) by mouth daily.  Modified Medications   No medications on file  Discontinued Medications   No medications on file    Physical Exam:  Vitals:   01/21/20 1409 01/21/20 1415  BP: (!) 142/78   Pulse: (!) 111 85  Temp: (!) 97.1 F (36.2 C)   TempSrc: Temporal   SpO2: 91% 93%  Weight: 242 lb (109.8 kg)   Height: 5' 8"  (1.727 m)    Body mass index is 36.8 kg/m. Wt Readings from Last 3 Encounters:  01/21/20 242 lb (109.8 kg)  10/15/19 235 lb (106.6 kg)  03/24/19 229 lb (103.9 kg)    Physical Exam Constitutional:      General: He is not in acute distress.    Appearance: He is well-developed. He is not diaphoretic.  HENT:     Head: Normocephalic and atraumatic.     Mouth/Throat:     Pharynx: No oropharyngeal exudate.  Eyes:     Conjunctiva/sclera: Conjunctivae normal.     Pupils: Pupils are equal, round, and reactive to light.  Cardiovascular:     Rate and Rhythm: Tachycardia present. Rhythm irregular.     Heart sounds: Normal heart sounds.  Pulmonary:     Effort: Pulmonary effort is normal.     Breath sounds: Normal breath sounds.  Abdominal:     General: Bowel sounds are normal.     Palpations: Abdomen is soft.  Musculoskeletal:        General: No tenderness.     Cervical back: Normal range of motion and neck supple.     Right lower leg: 2+ Edema present.      Left lower leg: 2+ Edema present.  Skin:    General: Skin is warm and dry.  Neurological:     Mental Status: He is alert and oriented to person, place, and time.     Labs reviewed: Basic Metabolic Panel: Recent Labs    09/26/19 1331  NA 141  K 3.9  CL 97*  CO2 32  GLUCOSE 191*  BUN 22  CREATININE 1.77*  CALCIUM 10.3   Liver Function Tests: Recent Labs  09/26/19 1331  AST 11  ALT 10  BILITOT 0.9  PROT 6.7   No results for input(s): LIPASE, AMYLASE in the last 8760 hours. No results for input(s): AMMONIA in the last 8760 hours. CBC: Recent Labs    09/26/19 1331  WBC 9.1  NEUTROABS 6,079  HGB 16.1  HCT 48.8  MCV 90.0  PLT 205   Lipid Panel: Recent Labs    09/26/19 1331  CHOL 159  HDL 38*  LDLCALC 91  TRIG 199*  CHOLHDL 4.2   TSH: No results for input(s): TSH in the last 8760 hours. A1C: Lab Results  Component Value Date   HGBA1C 9.0 (H) 09/26/2019     Assessment/Plan 1. Need for influenza vaccination - Flu Vaccine QUAD High Dose(Fluad)  2. Essential hypertension Improved on recheck, however plan to stop norvasc 5 mg daily due to increased LE - continue on combination tablet olmesartan-hctz-amlodipine. May need to stop amlodipine all together due to LE>   3. Permanent atrial fibrillation (HCC) -Continues on metoprolol succinate and eliquis BID.  - ECHOCARDIOGRAM COMPLETE; Future - DG Chest 2 View; Future - Brain Natriuretic Peptide - CMP with eGFR([facility]) - CBC with Differential/Platelet - TSH  4. DM type 2, uncontrolled, with renal complications (HCC) -does not check blood sugar at home, last A1c was not controlled. Likely will need adjustment in medication. Will have him follow up A1c at this time. Encouraged dietary compliance, routine foot care/monitoring and to keep up with diabetic eye exams through ophthalmology  - Hemoglobin A1c  5. Bilateral leg edema Worse at this time, ?due to increase in amlodipine but also could be due to  worsening heart failure. He has been advised on this and test ordered to evaluate. Low sodium diet recommended.   6. Shortness of breath -reports increase shortness of breath on exertion, very inactive.  -now with worsening LE edema however with hx of chf and a fib.  - DG Chest 2 View; Future - ECHOCARDIOGRAM COMPLETE; Future - Brain Natriuretic Peptide  7. Congestive heart failure, unspecified HF chronicity, unspecified heart failure type ([place]) -has not routinely followed with cardiology, as above pt reports  I am not going to spend the rest of my life going back and forth to the doctors" "I would rather be in the ground"  He reports he has been doing fine and has not felt a need to follow up routinely.  - ECHOCARDIOGRAM COMPLETE; Future  8. Hyperlipidemia LDL goal <70 -continues on crestor.  - Lipid Panel  Next appt: to keep follow up in 3 weeks as scheduled for re-evaluation of blood pressure, shortness of breath and edema.  Carlos American. [place], [facility] [phone]

## 2020-01-21 NOTE — Telephone Encounter (Signed)
Patient scheduled an appointment today with Janett Billow and will bring mediation with him to appointment.

## 2020-01-22 ENCOUNTER — Other Ambulatory Visit: Payer: Self-pay

## 2020-01-22 ENCOUNTER — Other Ambulatory Visit: Payer: Self-pay | Admitting: Nurse Practitioner

## 2020-01-22 ENCOUNTER — Ambulatory Visit
Admission: RE | Admit: 2020-01-22 | Discharge: 2020-01-22 | Disposition: A | Discharge status: Home / Self Care | Payer: Medicare Other | Source: Ambulatory Visit | Attending: Nurse Practitioner | Admitting: Nurse Practitioner

## 2020-01-22 DIAGNOSIS — I4821 Permanent atrial fibrillation: Secondary | ICD-10-CM

## 2020-01-22 DIAGNOSIS — IMO0002 Reserved for concepts with insufficient information to code with codable children: Secondary | ICD-10-CM

## 2020-01-22 DIAGNOSIS — R0602 Shortness of breath: Secondary | ICD-10-CM | POA: Diagnosis not present

## 2020-01-22 LAB — CBC WITH DIFFERENTIAL/PLATELET
Absolute Monocytes: 656 cells/uL (ref 200–950)
Basophils Absolute: 49 cells/uL (ref 0–200)
Basophils Relative: 0.6 %
Eosinophils Absolute: 279 cells/uL (ref 15–500)
Eosinophils Relative: 3.4 %
HCT: 41.6 % (ref 38.5–50.0)
Hemoglobin: 13.7 g/dL (ref 13.2–17.1)
Lymphs Abs: 1222 cells/uL (ref 850–3900)
MCH: 29.3 pg (ref 27.0–33.0)
MCHC: 32.9 g/dL (ref 32.0–36.0)
MCV: 88.9 fL (ref 80.0–100.0)
MPV: 9.6 fL (ref 7.5–12.5)
Monocytes Relative: 8 %
Neutro Abs: 5994 cells/uL (ref 1500–7800)
Neutrophils Relative %: 73.1 %
Platelets: 257 10*3/uL (ref 140–400)
RBC: 4.68 10*6/uL (ref 4.20–5.80)
RDW: 12.5 % (ref 11.0–15.0)
Total Lymphocyte: 14.9 %
WBC: 8.2 10*3/uL (ref 3.8–10.8)

## 2020-01-22 LAB — LIPID PANEL
Cholesterol: 119 mg/dL (ref <200)
HDL: 32 mg/dL — ABNORMAL LOW (ref >=40)
LDL Cholesterol (Calc): 68 mg/dL (calc)
Non-HDL Cholesterol (Calc): 87 mg/dL (calc) (ref <130)
Total CHOL/HDL Ratio: 3.7 (calc) (ref <5.0)
Triglycerides: 107 mg/dL (ref <150)

## 2020-01-22 LAB — HEMOGLOBIN A1C
Hgb A1c MFr Bld: 7.9 % of total Hgb — ABNORMAL HIGH (ref <5.7)
Mean Plasma Glucose: 180 (calc)
eAG (mmol/L): 10 (calc)

## 2020-01-22 LAB — COMPLETE METABOLIC PANEL WITH GFR
AG Ratio: 1.6 (calc) (ref 1.0–2.5)
ALT: 8 U/L — ABNORMAL LOW (ref 9–46)
AST: 10 U/L (ref 10–35)
Albumin: 4.1 g/dL (ref 3.6–5.1)
Alkaline phosphatase (APISO): 87 U/L (ref 35–144)
BUN/Creatinine Ratio: 11 (calc) (ref 6–22)
BUN: 17 mg/dL (ref 7–25)
CO2: 34 mmol/L — ABNORMAL HIGH (ref 20–32)
Calcium: 9.8 mg/dL (ref 8.6–10.3)
Chloride: 100 mmol/L (ref 98–110)
Creat: 1.59 mg/dL — ABNORMAL HIGH (ref 0.70–1.18)
GFR, Est African American: 48 mL/min/{1.73_m2} — ABNORMAL LOW (ref >=60)
GFR, Est Non African American: 41 mL/min/{1.73_m2} — ABNORMAL LOW (ref >=60)
Globulin: 2.5 g/dL (calc) (ref 1.9–3.7)
Glucose, Bld: 123 mg/dL — ABNORMAL HIGH (ref 65–99)
Potassium: 3.7 mmol/L (ref 3.5–5.3)
Sodium: 141 mmol/L (ref 135–146)
Total Bilirubin: 0.8 mg/dL (ref 0.2–1.2)
Total Protein: 6.6 g/dL (ref 6.1–8.1)

## 2020-01-22 LAB — BRAIN NATRIURETIC PEPTIDE: Brain Natriuretic Peptide: 249 pg/mL — ABNORMAL HIGH (ref <100)

## 2020-01-22 LAB — TSH: TSH: 3.04 mIU/L (ref 0.40–4.50)

## 2020-01-22 MED ORDER — FUROSEMIDE 40 MG PO TABS: 40.0000 mg | ORAL_TABLET | Freq: Every day | ORAL | 0 refills | Status: DC

## 2020-01-22 MED ORDER — DAPAGLIFLOZIN PROPANEDIOL 10 MG PO TABS: 10.0000 mg | ORAL_TABLET | Freq: Every day | ORAL | 3 refills | Status: DC

## 2020-01-22 MED ORDER — POTASSIUM CHLORIDE CRYS ER 20 MEQ PO TBCR: 20.0000 meq | EXTENDED_RELEASE_TABLET | Freq: Every day | ORAL | 0 refills | Status: DC

## 2020-02-02 ENCOUNTER — Other Ambulatory Visit: Payer: Self-pay | Admitting: Nurse Practitioner

## 2020-02-02 DIAGNOSIS — E1129 Type 2 diabetes mellitus with other diabetic kidney complication: Secondary | ICD-10-CM

## 2020-02-02 DIAGNOSIS — IMO0002 Reserved for concepts with insufficient information to code with codable children: Secondary | ICD-10-CM

## 2020-02-09 ENCOUNTER — Other Ambulatory Visit: Payer: Medicare Other

## 2020-02-09 ENCOUNTER — Other Ambulatory Visit: Payer: Self-pay

## 2020-02-09 DIAGNOSIS — E1129 Type 2 diabetes mellitus with other diabetic kidney complication: Secondary | ICD-10-CM

## 2020-02-09 DIAGNOSIS — I10 Essential (primary) hypertension: Secondary | ICD-10-CM | POA: Diagnosis not present

## 2020-02-09 DIAGNOSIS — E785 Hyperlipidemia, unspecified: Secondary | ICD-10-CM

## 2020-02-09 DIAGNOSIS — E1165 Type 2 diabetes mellitus with hyperglycemia: Secondary | ICD-10-CM | POA: Diagnosis not present

## 2020-02-09 DIAGNOSIS — D649 Anemia, unspecified: Secondary | ICD-10-CM

## 2020-02-09 DIAGNOSIS — IMO0002 Reserved for concepts with insufficient information to code with codable children: Secondary | ICD-10-CM

## 2020-02-09 DIAGNOSIS — N1832 Chronic kidney disease, stage 3b: Secondary | ICD-10-CM

## 2020-02-10 LAB — CBC WITH DIFFERENTIAL/PLATELET
Absolute Monocytes: 632 cells/uL (ref 200–950)
Basophils Absolute: 40 cells/uL (ref 0–200)
Basophils Relative: 0.5 %
Eosinophils Absolute: 232 cells/uL (ref 15–500)
Eosinophils Relative: 2.9 %
HCT: 42 % (ref 38.5–50.0)
Hemoglobin: 13.8 g/dL (ref 13.2–17.1)
Lymphs Abs: 1408 cells/uL (ref 850–3900)
MCH: 29.2 pg (ref 27.0–33.0)
MCHC: 32.9 g/dL (ref 32.0–36.0)
MCV: 89 fL (ref 80.0–100.0)
MPV: 9.9 fL (ref 7.5–12.5)
Monocytes Relative: 7.9 %
Neutro Abs: 5688 cells/uL (ref 1500–7800)
Neutrophils Relative %: 71.1 %
Platelets: 206 10*3/uL (ref 140–400)
RBC: 4.72 10*6/uL (ref 4.20–5.80)
RDW: 12.8 % (ref 11.0–15.0)
Total Lymphocyte: 17.6 %
WBC: 8 10*3/uL (ref 3.8–10.8)

## 2020-02-10 LAB — COMPLETE METABOLIC PANEL WITH GFR
AG Ratio: 1.6 (calc) (ref 1.0–2.5)
ALT: 10 U/L (ref 9–46)
AST: 14 U/L (ref 10–35)
Albumin: 4.1 g/dL (ref 3.6–5.1)
Alkaline phosphatase (APISO): 67 U/L (ref 35–144)
BUN/Creatinine Ratio: 20 (calc) (ref 6–22)
BUN: 42 mg/dL — ABNORMAL HIGH (ref 7–25)
CO2: 33 mmol/L — ABNORMAL HIGH (ref 20–32)
Calcium: 10.2 mg/dL (ref 8.6–10.3)
Chloride: 97 mmol/L — ABNORMAL LOW (ref 98–110)
Creat: 2.15 mg/dL — ABNORMAL HIGH (ref 0.70–1.18)
GFR, Est African American: 33 mL/min/{1.73_m2} — ABNORMAL LOW (ref >=60)
GFR, Est Non African American: 28 mL/min/{1.73_m2} — ABNORMAL LOW (ref >=60)
Globulin: 2.6 g/dL (calc) (ref 1.9–3.7)
Glucose, Bld: 89 mg/dL (ref 65–99)
Potassium: 3.6 mmol/L (ref 3.5–5.3)
Sodium: 142 mmol/L (ref 135–146)
Total Bilirubin: 1 mg/dL (ref 0.2–1.2)
Total Protein: 6.7 g/dL (ref 6.1–8.1)

## 2020-02-10 LAB — LIPID PANEL
Cholesterol: 124 mg/dL (ref <200)
HDL: 46 mg/dL (ref >=40)
LDL Cholesterol (Calc): 62 mg/dL (calc)
Non-HDL Cholesterol (Calc): 78 mg/dL (calc) (ref <130)
Total CHOL/HDL Ratio: 2.7 (calc) (ref <5.0)
Triglycerides: 82 mg/dL (ref <150)

## 2020-02-10 LAB — HEMOGLOBIN A1C
Hgb A1c MFr Bld: 7.5 % of total Hgb — ABNORMAL HIGH (ref <5.7)
Mean Plasma Glucose: 169 (calc)
eAG (mmol/L): 9.3 (calc)

## 2020-02-13 ENCOUNTER — Other Ambulatory Visit: Payer: Self-pay

## 2020-02-13 ENCOUNTER — Ambulatory Visit (INDEPENDENT_AMBULATORY_CARE_PROVIDER_SITE_OTHER): Payer: Medicare Other | Admitting: Nurse Practitioner

## 2020-02-13 ENCOUNTER — Encounter: Payer: Self-pay | Admitting: Nurse Practitioner

## 2020-02-13 VITALS — BP 120/60 | HR 97 | Temp 97.7°F | Resp 22 | Ht 68.0 in | Wt 238.6 lb

## 2020-02-13 DIAGNOSIS — R600 Localized edema: Secondary | ICD-10-CM

## 2020-02-13 DIAGNOSIS — I509 Heart failure, unspecified: Secondary | ICD-10-CM

## 2020-02-13 DIAGNOSIS — Z1159 Encounter for screening for other viral diseases: Secondary | ICD-10-CM

## 2020-02-13 DIAGNOSIS — I10 Essential (primary) hypertension: Secondary | ICD-10-CM | POA: Diagnosis not present

## 2020-02-13 DIAGNOSIS — N1832 Chronic kidney disease, stage 3b: Secondary | ICD-10-CM

## 2020-02-13 DIAGNOSIS — E1165 Type 2 diabetes mellitus with hyperglycemia: Secondary | ICD-10-CM

## 2020-02-13 DIAGNOSIS — E1129 Type 2 diabetes mellitus with other diabetic kidney complication: Secondary | ICD-10-CM | POA: Diagnosis not present

## 2020-02-13 DIAGNOSIS — IMO0002 Reserved for concepts with insufficient information to code with codable children: Secondary | ICD-10-CM

## 2020-02-13 NOTE — Progress Notes (Signed)
Careteam: Patient Care Team: Lauree Chandler, NP as PCP - General (Geriatric Medicine)  PLACE OF SERVICE:  [place] Directive information Does Patient Have a Medical Advance Directive?: Yes (Patient refused to give copy), Does patient want to make changes to medical advance directive?: No - Patient declined  Allergies  Allergen Reactions   Allopurinol Other (See Comments)    Whelps on body and sores in mouth     Chief Complaint  Patient presents with   Medical Management of Chronic Issues    3 Month Follow Up, Discuss need for Hep-C Screening,Eye Exam, Foot Exam, Covid Vaccine     HPI: Patient is a 79 y.o. male for follow up on LE edema, CKD and htn.   Reports he stopped lasix, was not taking potassium routinely Reports shortness of breath has improved.   Did not get farxiga at pharmacy, does not check blood sugar routinely    Review of Systems:  Review of Systems  Constitutional: Negative for chills, fever and weight loss.  HENT: Negative for tinnitus.   Respiratory: Negative for cough, sputum production and shortness of breath.   Cardiovascular: Negative for chest pain, palpitations and leg swelling.  Gastrointestinal: Negative for abdominal pain, constipation, diarrhea and heartburn.  Genitourinary: Negative for dysuria, frequency and urgency.  Musculoskeletal: Negative for back pain, falls, joint pain and myalgias.  Skin: Negative.   Neurological: Positive for tingling. Negative for dizziness, sensory change and headaches.  Psychiatric/Behavioral: Negative for depression and memory loss. The patient does not have insomnia.     Past Medical History:  Diagnosis Date   Cholelithiasis 58/8502   uncomplicated.     Chronic kidney disease, stage II (mild)    Depression    Diabetes mellitus without complication ([place])    Essential hypertension, malignant    Fatty liver 02/2018   on ultrasound   Gout, unspecified    Hyperlipidemia     Lumbago    Obesity, unspecified    Pain in joint, pelvic region and thigh    Pleural effusion on right 02/2018   Past Surgical History:  Procedure Laterality Date   APPENDECTOMY     COLONOSCOPY WITH PROPOFOL N/A 02/22/2018   Procedure: COLONOSCOPY WITH PROPOFOL;  Surgeon: Milus Banister, MD;  Location: [facility] ENDOSCOPY;  Service: Endoscopy;  Laterality: N/A;   CYST EXCISION  1998   back   ESOPHAGOGASTRODUODENOSCOPY (EGD) WITH PROPOFOL N/A 02/22/2018   Procedure: ESOPHAGOGASTRODUODENOSCOPY (EGD) WITH PROPOFOL;  Surgeon: Milus Banister, MD;  Location: [facility] ENDOSCOPY;  Service: Endoscopy;  Laterality: N/A;   POLYPECTOMY  02/22/2018   Procedure: POLYPECTOMY;  Surgeon: Milus Banister, MD;  Location: [facility] ENDOSCOPY;  Service: Endoscopy;;   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 His smokeless tobacco use includes chew. He reports current alcohol use of about 1.0 standard drink of alcohol per week. He reports that he does not use drugs.  Family History  Problem Relation Age of Onset   Heart Problems Father    Heart disease Brother    Diabetes Brother    Diabetes Brother    Heart Problems Mother    Pneumonia Maternal Grandfather     Medications: Patient's Medications  New Prescriptions   No medications on file  Previous Medications   ASPIRIN EC 81 MG TABLET    Take 81 mg by mouth daily. Swallow whole.   DAPAGLIFLOZIN PROPANEDIOL (FARXIGA)  10 MG TABS TABLET    Take 1 tablet (10 mg total) by mouth daily before breakfast.   DOXAZOSIN (CARDURA) 8 MG TABLET    Take one tablet by mouth once daily.   ELIQUIS 2.5 MG TABS TABLET    TAKE 1 TABLET(2.5 MG) BY MOUTH TWICE DAILY   GLIPIZIDE (GLUCOTROL) 5 MG TABLET    TAKE 1 TABLET(5 MG) BY MOUTH DAILY BEFORE BREAKFAST   METOPROLOL SUCCINATE (TOPROL-XL) 50 MG 24 HR TABLET    Take one tablet by mouth once daily with or immediately following a meal.    MULTIPLE VITAMINS-MINERALS (CENTRUM SILVER 50+MEN) TABS    Take 1 tablet by mouth daily.   OLMESARTAN-AMLODIPINE-HCTZ 20-5-12.5 MG TABS    TAKE 1 TABLET BY MOUTH DAILY   ROSUVASTATIN (CRESTOR) 5 MG TABLET    Take 1 tablet (5 mg total) by mouth daily.  Modified Medications   No medications on file  Discontinued Medications   AMLODIPINE (NORVASC) 5 MG TABLET    TAKE 1 TABLET(5 MG) BY MOUTH DAILY   FUROSEMIDE (LASIX) 40 MG TABLET    TAKE 1 TABLET(40 MG) BY MOUTH DAILY   POTASSIUM CHLORIDE SA (KLOR-CON) 20 MEQ TABLET    TAKE 1 TABLET(20 MEQ) BY MOUTH DAILY    Physical Exam:  Vitals:   02/13/20 1058  BP: 120/60  Pulse: 97  Resp: (!) 22  Temp: 97.7 F (36.5 C)  TempSrc: Temporal  SpO2: 97%  Weight: 238 lb 9.6 oz (108.2 kg)  Height: 5\' 8"  (1.727 m)   Body mass index is 36.28 kg/m. Wt Readings from Last 3 Encounters:  02/13/20 238 lb 9.6 oz (108.2 kg)  01/21/20 242 lb (109.8 kg)  10/15/19 235 lb (106.6 kg)    Physical Exam Constitutional:      General: He is not in acute distress.    Appearance: He is well-developed. He is not diaphoretic.  HENT:     Head: Normocephalic and atraumatic.     Mouth/Throat:     Pharynx: No oropharyngeal exudate.  Eyes:     Conjunctiva/sclera: Conjunctivae normal.     Pupils: Pupils are equal, round, and reactive to light.  Cardiovascular:     Rate and Rhythm: Normal rate and regular rhythm.     Heart sounds: Normal heart sounds.  Pulmonary:     Effort: Pulmonary effort is normal.     Breath sounds: Normal breath sounds.  Abdominal:     General: Bowel sounds are normal.     Palpations: Abdomen is soft.  Musculoskeletal:     Cervical back: Normal range of motion and neck supple.     Right lower leg: No edema.     Left lower leg: No edema.  Skin:    General: Skin is warm and dry.  Neurological:     Mental Status: He is alert and oriented to person, place, and time.     Labs reviewed: Basic Metabolic Panel: Recent Labs     09/26/19 1331 01/21/20 1437 02/09/20 1403  NA 141 141 142  K 3.9 3.7 3.6  CL 97* 100 97*  CO2 32 34* 33*  GLUCOSE 191* 123* 89  BUN 22 17 42*  CREATININE 1.77* 1.59* 2.15*  CALCIUM 10.3 9.8 10.2  TSH  --  3.04  --    Liver Function Tests: Recent Labs    09/26/19 1331 01/21/20 1437 02/09/20 1403  AST 11 10 14   ALT 10 8* 10  BILITOT 0.9 0.8 1.0  PROT 6.7 6.6 6.7  No results for input(s): LIPASE, AMYLASE in the last 8760 hours. No results for input(s): AMMONIA in the last 8760 hours. CBC: Recent Labs    09/26/19 1331 01/21/20 1437 02/09/20 1403  WBC 9.1 8.2 8.0  NEUTROABS 6,079 5,994 5,688  HGB 16.1 13.7 13.8  HCT 48.8 41.6 42.0  MCV 90.0 88.9 89.0  PLT 205 257 206   Lipid Panel: Recent Labs    09/26/19 1331 01/21/20 1437 02/09/20 1403  CHOL 159 119 124  HDL 38* 32* 46  LDLCALC 91 68 62  TRIG 199* 107 82  CHOLHDL 4.2 3.7 2.7   TSH: Recent Labs    01/21/20 1437  TSH 3.04   A1C: Lab Results  Component Value Date   HGBA1C 7.5 (H) 02/09/2020     Assessment/Plan .1. Need for hepatitis C screening test - Hepatitis C antibody; Future  2. Essential hypertension -stable at this time. Information provided on proper food choices (he eats a high sodium diet). Will continue olmsartan-amlodipine-hctz- - BASIC METABOLIC PANEL WITH GFR; Future  3. Bilateral leg edema Improved at this time.  - BASIC METABOLIC PANEL WITH GFR; Future  4. Congestive heart failure, unspecified HF chronicity, unspecified heart failure type ([place]) -has not followed up with cardiology and still reports he does not plan to. -echo has been scheduled.   5. DM type 2, uncontrolled, with renal complications ([place]) -Encouraged dietary compliance, routine foot care/monitoring and to keep up with diabetic eye exams through ophthalmology -started farxiga at last visit, he has not picked up from pharmacy yet. Reports he will follow up with pharmacy regarding this.   6. CKD stage  3 Worsening renal function on recent labs, lasix was stopped, swelling and shortness of breath stable. Pending echo.  -encouraged hydration, avoid NSAID  -started on farxiga   Next appt: labs next week, follow up 3 months.  Carlos American. [place], [facility] [phone]

## 2020-02-13 NOTE — Patient Instructions (Signed)
Please schedule labs next week- they do NOT have to be fasting   To check with your pharmacy about dapagliflozin [place]) -- this medication protects kidneys, heart and helps with diabetes

## 2020-02-16 ENCOUNTER — Other Ambulatory Visit: Payer: Self-pay

## 2020-02-16 DIAGNOSIS — IMO0002 Reserved for concepts with insufficient information to code with codable children: Secondary | ICD-10-CM

## 2020-02-16 DIAGNOSIS — E1165 Type 2 diabetes mellitus with hyperglycemia: Secondary | ICD-10-CM

## 2020-02-16 MED ORDER — FREESTYLE LIBRE 14 DAY READER DEVI: 1.0000 | 0 refills | Status: DC | PRN

## 2020-02-16 MED ORDER — FREESTYLE LIBRE 14 DAY SENSOR MISC: 1.0000 | 3 refills | Status: DC | PRN

## 2020-02-17 ENCOUNTER — Encounter (HOSPITAL_COMMUNITY): Payer: Self-pay | Admitting: Nurse Practitioner

## 2020-02-17 ENCOUNTER — Other Ambulatory Visit (HOSPITAL_COMMUNITY): Payer: Medicare Other

## 2020-02-23 ENCOUNTER — Other Ambulatory Visit: Payer: Medicare Other

## 2020-02-23 ENCOUNTER — Other Ambulatory Visit: Payer: Self-pay

## 2020-02-23 ENCOUNTER — Telehealth: Payer: Self-pay

## 2020-02-23 DIAGNOSIS — R600 Localized edema: Secondary | ICD-10-CM

## 2020-02-23 DIAGNOSIS — I10 Essential (primary) hypertension: Secondary | ICD-10-CM | POA: Diagnosis not present

## 2020-02-23 DIAGNOSIS — Z1159 Encounter for screening for other viral diseases: Secondary | ICD-10-CM | POA: Diagnosis not present

## 2020-02-23 NOTE — Telephone Encounter (Signed)
Prior authorization request received from [employer] for [employer] 14 day sensor. Request started through Cover my meds.

## 2020-02-24 LAB — BASIC METABOLIC PANEL WITH GFR
BUN/Creatinine Ratio: 18 (calc) (ref 6–22)
BUN: 38 mg/dL — ABNORMAL HIGH (ref 7–25)
CO2: 35 mmol/L — ABNORMAL HIGH (ref 20–32)
Calcium: 9.9 mg/dL (ref 8.6–10.3)
Chloride: 97 mmol/L — ABNORMAL LOW (ref 98–110)
Creat: 2.16 mg/dL — ABNORMAL HIGH (ref 0.70–1.18)
GFR, Est African American: 33 mL/min/{1.73_m2} — ABNORMAL LOW (ref >=60)
GFR, Est Non African American: 28 mL/min/{1.73_m2} — ABNORMAL LOW (ref >=60)
Glucose, Bld: 96 mg/dL (ref 65–99)
Potassium: 3.4 mmol/L — ABNORMAL LOW (ref 3.5–5.3)
Sodium: 142 mmol/L (ref 135–146)

## 2020-02-24 LAB — HEPATITIS C ANTIBODY
Hepatitis C Ab: NONREACTIVE
SIGNAL TO CUT-OFF: 0.01 (ref <1.00)

## 2020-02-26 ENCOUNTER — Other Ambulatory Visit: Payer: Self-pay | Admitting: Nurse Practitioner

## 2020-02-26 DIAGNOSIS — I10 Essential (primary) hypertension: Secondary | ICD-10-CM

## 2020-02-26 DIAGNOSIS — I509 Heart failure, unspecified: Secondary | ICD-10-CM

## 2020-02-26 DIAGNOSIS — I480 Paroxysmal atrial fibrillation: Secondary | ICD-10-CM

## 2020-02-27 ENCOUNTER — Other Ambulatory Visit: Payer: Self-pay | Admitting: Nurse Practitioner

## 2020-02-27 ENCOUNTER — Telehealth (HOSPITAL_COMMUNITY): Payer: Self-pay | Admitting: Nurse Practitioner

## 2020-02-27 DIAGNOSIS — I4821 Permanent atrial fibrillation: Secondary | ICD-10-CM

## 2020-02-27 DIAGNOSIS — I509 Heart failure, unspecified: Secondary | ICD-10-CM

## 2020-02-27 NOTE — Telephone Encounter (Signed)
Patient called requesting refill of Eliquis.

## 2020-02-27 NOTE — Telephone Encounter (Signed)
Just an FYI. We have made several attempts to contact this patient including sending a letter to schedule or reschedule their echocardiogram. We will be removing the patient from the echo [place].   02/17/20 NO SHOW- Mailed letter/LBW  01/22/20 [facility] to schedule @ 3:01 pm/LBW  01/21/20 INBASKET SENT FOR PA#     Thank you

## 2020-04-13 ENCOUNTER — Other Ambulatory Visit: Payer: Self-pay | Admitting: Nurse Practitioner

## 2020-04-13 DIAGNOSIS — IMO0002 Reserved for concepts with insufficient information to code with codable children: Secondary | ICD-10-CM

## 2020-04-19 ENCOUNTER — Other Ambulatory Visit: Payer: Self-pay | Admitting: Nurse Practitioner

## 2020-04-19 DIAGNOSIS — E7800 Pure hypercholesterolemia, unspecified: Secondary | ICD-10-CM

## 2020-04-20 ENCOUNTER — Other Ambulatory Visit: Payer: Self-pay | Admitting: Nurse Practitioner

## 2020-05-19 IMAGING — DX DG CHEST 1V PORT
2 series · 2 of 2 positions shown · non-contrast
Comparison: 09/29/2009

CLINICAL DATA: Short of breath

EXAM:
PORTABLE CHEST 1 VIEW

[chest ap (1 of 2)]
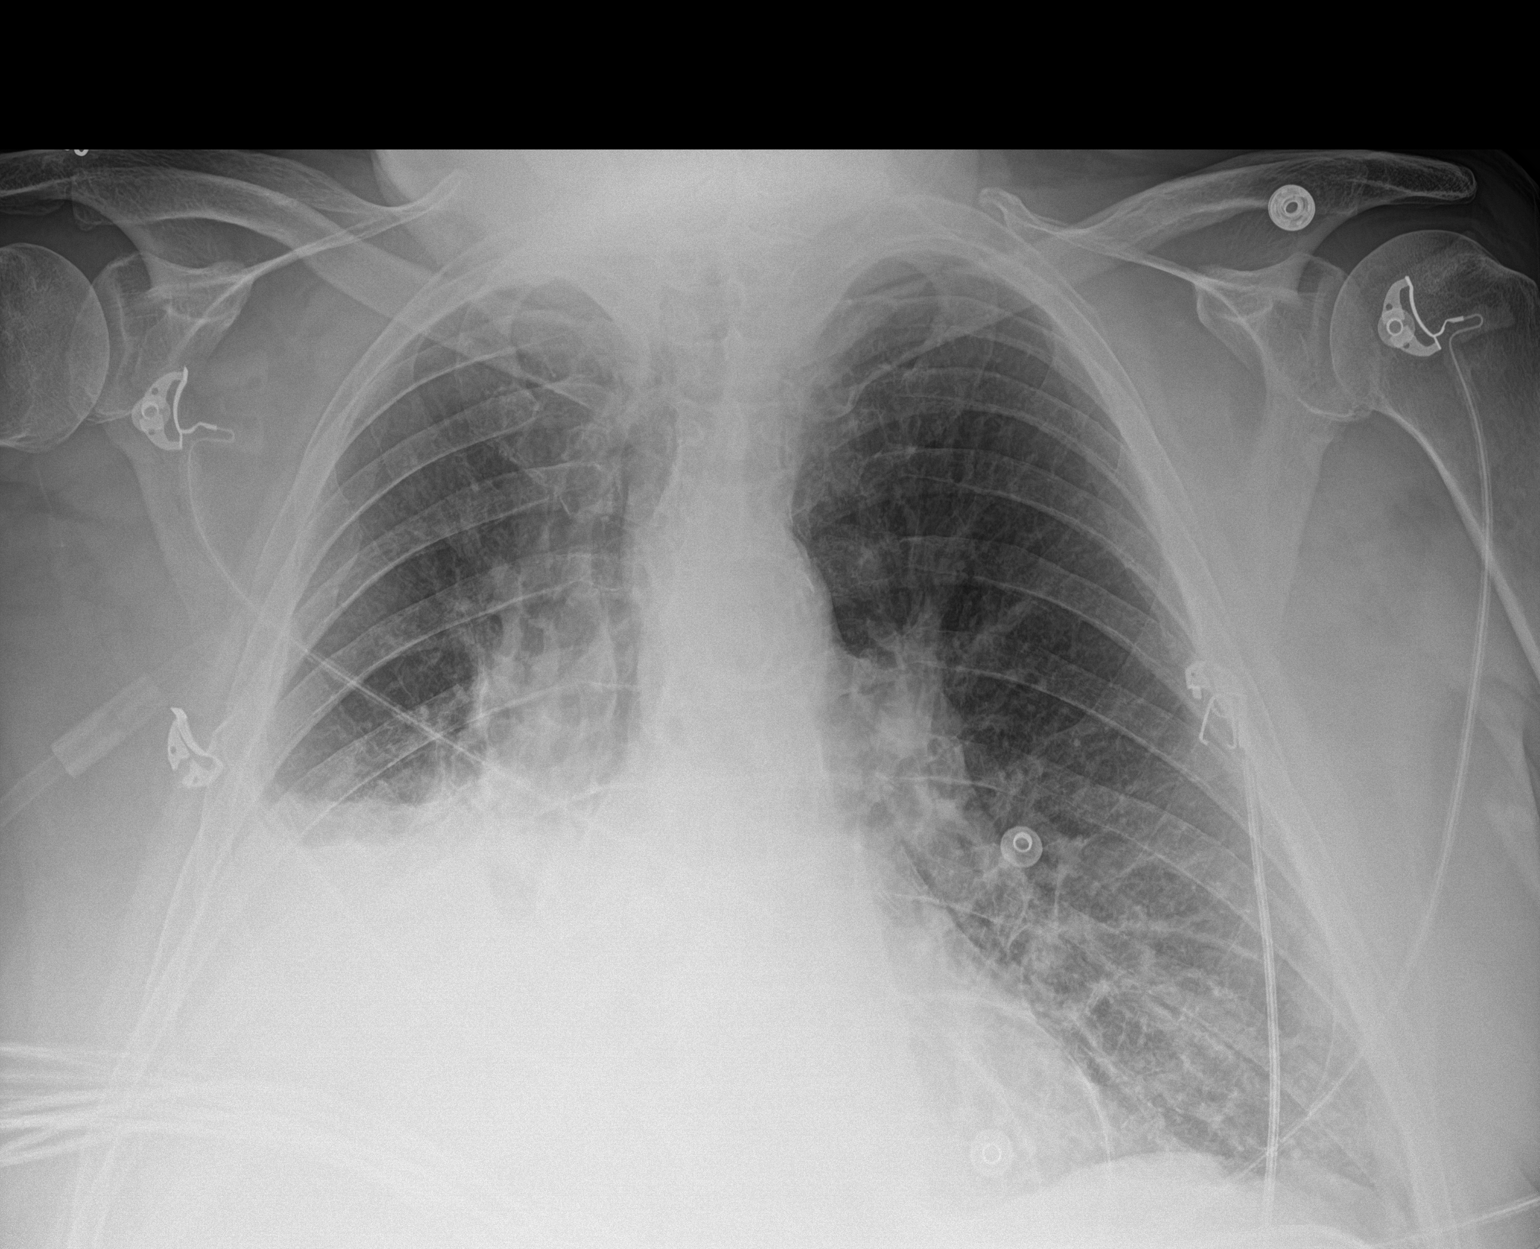

[chest ap (2 of 2)]
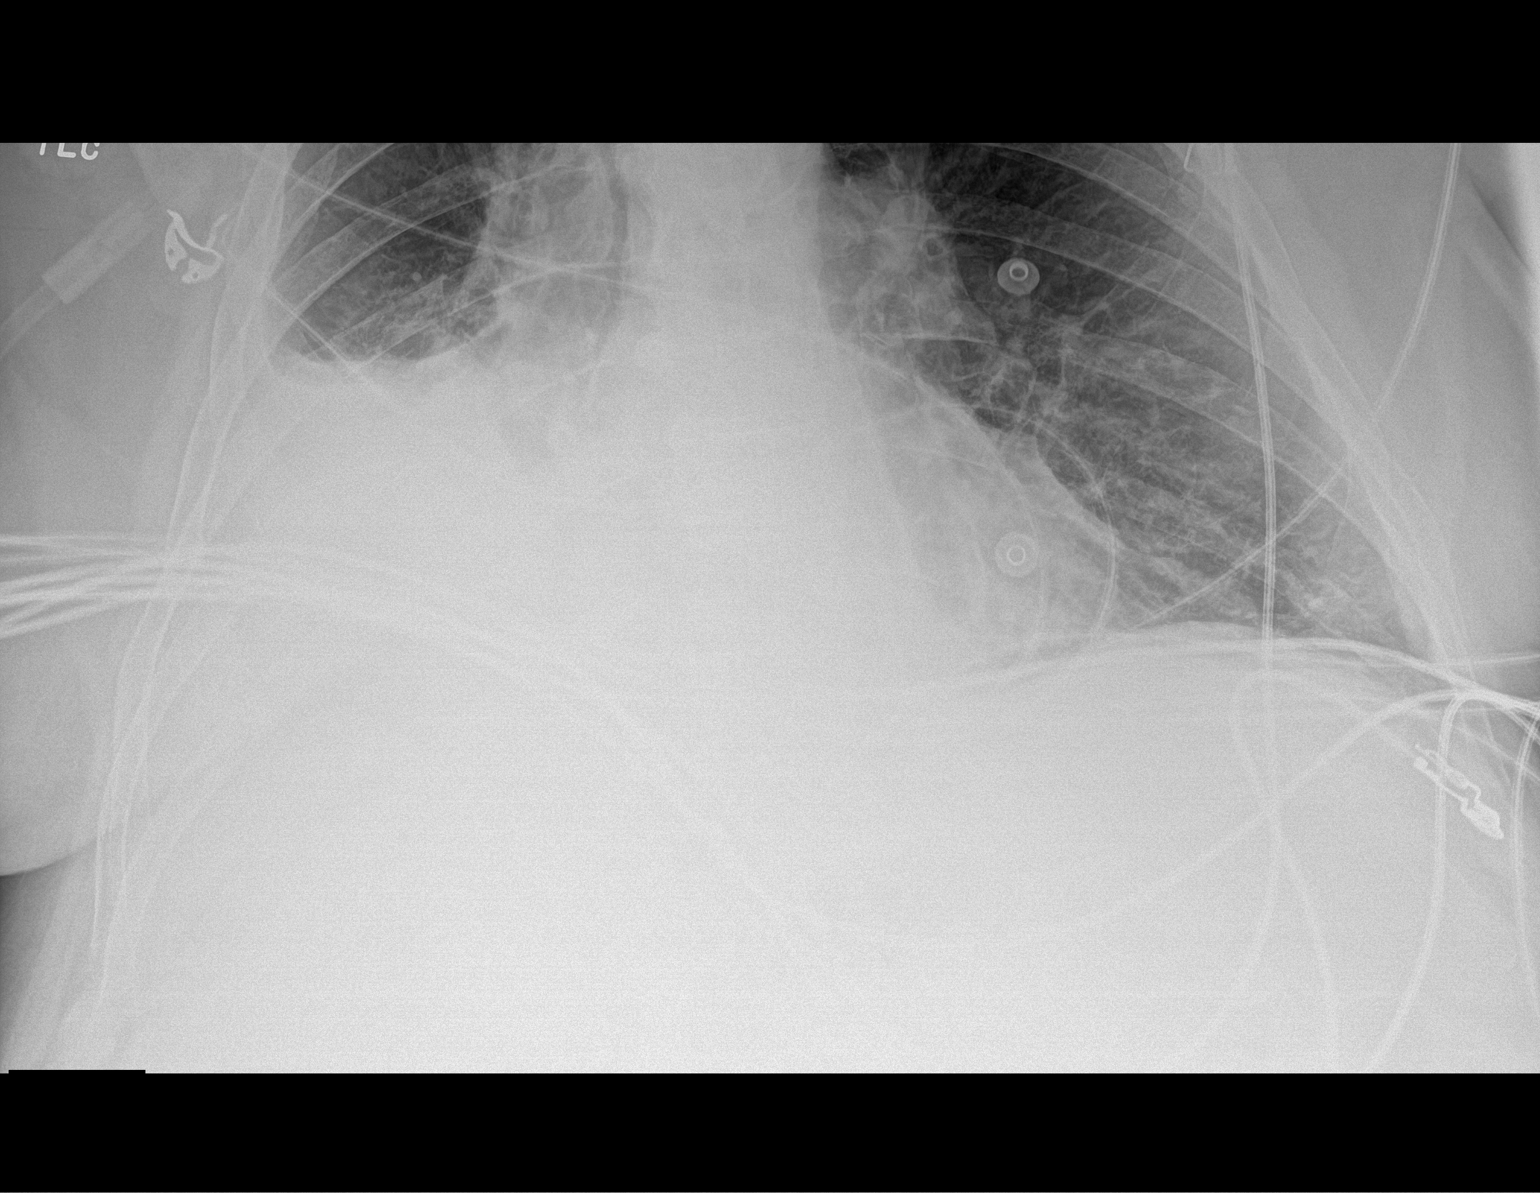

[2 of 2 positions shown; findings below may reference images not displayed]

FINDINGS: Moderate right pleural effusion. Dense airspace disease at the right
middle lobe and right base. Cardiomediastinal silhouette is largely
obscured. There is vascular congestion. No pneumothorax.
IMPRESSION: Moderate right pleural effusion with dense atelectasis or pneumonia
at the right middle lobe and right base. Vascular congestion.

## 2020-07-30 ENCOUNTER — Emergency Department (HOSPITAL_COMMUNITY): Payer: Medicare Other

## 2020-07-30 ENCOUNTER — Other Ambulatory Visit: Payer: Self-pay

## 2020-07-30 ENCOUNTER — Encounter (HOSPITAL_COMMUNITY): Payer: Self-pay | Admitting: Pharmacy Technician

## 2020-07-30 ENCOUNTER — Inpatient Hospital Stay (HOSPITAL_COMMUNITY)
Admission: EM | Admit: 2020-07-30 | Discharge: 2020-08-03 | DRG: 291 | Disposition: A | Discharge status: Home / Self Care | Payer: Medicare Other | Attending: Internal Medicine | Admitting: Internal Medicine

## 2020-07-30 ENCOUNTER — Inpatient Hospital Stay (HOSPITAL_COMMUNITY): Payer: Medicare Other

## 2020-07-30 DIAGNOSIS — Z905 Acquired absence of kidney: Secondary | ICD-10-CM

## 2020-07-30 DIAGNOSIS — Z20822 Contact with and (suspected) exposure to covid-19: Secondary | ICD-10-CM | POA: Diagnosis not present

## 2020-07-30 DIAGNOSIS — I509 Heart failure, unspecified: Secondary | ICD-10-CM

## 2020-07-30 DIAGNOSIS — Z7984 Long term (current) use of oral hypoglycemic drugs: Secondary | ICD-10-CM

## 2020-07-30 DIAGNOSIS — I130 Hypertensive heart and chronic kidney disease with heart failure and stage 1 through stage 4 chronic kidney disease, or unspecified chronic kidney disease: Principal | ICD-10-CM | POA: Diagnosis present

## 2020-07-30 DIAGNOSIS — I110 Hypertensive heart disease with heart failure: Secondary | ICD-10-CM | POA: Diagnosis not present

## 2020-07-30 DIAGNOSIS — J9601 Acute respiratory failure with hypoxia: Secondary | ICD-10-CM | POA: Diagnosis not present

## 2020-07-30 DIAGNOSIS — R0602 Shortness of breath: Secondary | ICD-10-CM | POA: Diagnosis not present

## 2020-07-30 DIAGNOSIS — M1610 Unilateral primary osteoarthritis, unspecified hip: Secondary | ICD-10-CM | POA: Diagnosis present

## 2020-07-30 DIAGNOSIS — E785 Hyperlipidemia, unspecified: Secondary | ICD-10-CM | POA: Diagnosis not present

## 2020-07-30 DIAGNOSIS — E1129 Type 2 diabetes mellitus with other diabetic kidney complication: Secondary | ICD-10-CM | POA: Diagnosis present

## 2020-07-30 DIAGNOSIS — Z8249 Family history of ischemic heart disease and other diseases of the circulatory system: Secondary | ICD-10-CM | POA: Diagnosis not present

## 2020-07-30 DIAGNOSIS — Z79899 Other long term (current) drug therapy: Secondary | ICD-10-CM | POA: Diagnosis not present

## 2020-07-30 DIAGNOSIS — I4819 Other persistent atrial fibrillation: Secondary | ICD-10-CM | POA: Diagnosis present

## 2020-07-30 DIAGNOSIS — Z6837 Body mass index (BMI) 37.0-37.9, adult: Secondary | ICD-10-CM

## 2020-07-30 DIAGNOSIS — Z7901 Long term (current) use of anticoagulants: Secondary | ICD-10-CM | POA: Diagnosis not present

## 2020-07-30 DIAGNOSIS — J9600 Acute respiratory failure, unspecified whether with hypoxia or hypercapnia: Secondary | ICD-10-CM | POA: Diagnosis present

## 2020-07-30 DIAGNOSIS — E1122 Type 2 diabetes mellitus with diabetic chronic kidney disease: Secondary | ICD-10-CM | POA: Diagnosis not present

## 2020-07-30 DIAGNOSIS — Z833 Family history of diabetes mellitus: Secondary | ICD-10-CM | POA: Diagnosis not present

## 2020-07-30 DIAGNOSIS — Z7982 Long term (current) use of aspirin: Secondary | ICD-10-CM | POA: Diagnosis not present

## 2020-07-30 DIAGNOSIS — I5033 Acute on chronic diastolic (congestive) heart failure: Secondary | ICD-10-CM | POA: Diagnosis present

## 2020-07-30 DIAGNOSIS — E1165 Type 2 diabetes mellitus with hyperglycemia: Secondary | ICD-10-CM | POA: Diagnosis not present

## 2020-07-30 DIAGNOSIS — E669 Obesity, unspecified: Secondary | ICD-10-CM | POA: Diagnosis present

## 2020-07-30 DIAGNOSIS — I420 Dilated cardiomyopathy: Secondary | ICD-10-CM | POA: Diagnosis not present

## 2020-07-30 DIAGNOSIS — N183 Chronic kidney disease, stage 3 unspecified: Secondary | ICD-10-CM | POA: Diagnosis present

## 2020-07-30 DIAGNOSIS — N1831 Chronic kidney disease, stage 3a: Secondary | ICD-10-CM | POA: Diagnosis present

## 2020-07-30 DIAGNOSIS — N179 Acute kidney failure, unspecified: Secondary | ICD-10-CM | POA: Diagnosis present

## 2020-07-30 DIAGNOSIS — E876 Hypokalemia: Secondary | ICD-10-CM | POA: Diagnosis not present

## 2020-07-30 DIAGNOSIS — Z87891 Personal history of nicotine dependence: Secondary | ICD-10-CM

## 2020-07-30 DIAGNOSIS — R0902 Hypoxemia: Secondary | ICD-10-CM

## 2020-07-30 DIAGNOSIS — J90 Pleural effusion, not elsewhere classified: Secondary | ICD-10-CM | POA: Diagnosis not present

## 2020-07-30 DIAGNOSIS — I10 Essential (primary) hypertension: Secondary | ICD-10-CM | POA: Diagnosis present

## 2020-07-30 DIAGNOSIS — I5043 Acute on chronic combined systolic (congestive) and diastolic (congestive) heart failure: Secondary | ICD-10-CM | POA: Diagnosis present

## 2020-07-30 DIAGNOSIS — I4891 Unspecified atrial fibrillation: Secondary | ICD-10-CM | POA: Diagnosis present

## 2020-07-30 LAB — COMPREHENSIVE METABOLIC PANEL
ALT: 14 U/L (ref 0–44)
AST: 15 U/L (ref 15–41)
Albumin: 3.3 g/dL — ABNORMAL LOW (ref 3.5–5.0)
Alkaline Phosphatase: 79 U/L (ref 38–126)
Anion gap: 5 (ref 5–15)
BUN: 25 mg/dL — ABNORMAL HIGH (ref 8–23)
CO2: 34 mmol/L — ABNORMAL HIGH (ref 22–32)
Calcium: 9.3 mg/dL (ref 8.9–10.3)
Chloride: 102 mmol/L (ref 98–111)
Creatinine, Ser: 1.65 mg/dL — ABNORMAL HIGH (ref 0.61–1.24)
GFR, Estimated: 42 mL/min — ABNORMAL LOW (ref >60)
Glucose, Bld: 117 mg/dL — ABNORMAL HIGH (ref 70–99)
Potassium: 3.4 mmol/L — ABNORMAL LOW (ref 3.5–5.1)
Sodium: 141 mmol/L (ref 135–145)
Total Bilirubin: 0.9 mg/dL (ref 0.3–1.2)
Total Protein: 6.3 g/dL — ABNORMAL LOW (ref 6.5–8.1)

## 2020-07-30 LAB — CBC WITH DIFFERENTIAL/PLATELET
Abs Immature Granulocytes: 0.03 10*3/uL (ref 0.00–0.07)
Basophils Absolute: 0 10*3/uL (ref 0.0–0.1)
Basophils Relative: 1 %
Eosinophils Absolute: 0.3 10*3/uL (ref 0.0–0.5)
Eosinophils Relative: 4 %
HCT: 41.5 % (ref 39.0–52.0)
Hemoglobin: 13 g/dL (ref 13.0–17.0)
Immature Granulocytes: 0 %
Lymphocytes Relative: 15 %
Lymphs Abs: 1.2 10*3/uL (ref 0.7–4.0)
MCH: 28.8 pg (ref 26.0–34.0)
MCHC: 31.3 g/dL (ref 30.0–36.0)
MCV: 92 fL (ref 80.0–100.0)
Monocytes Absolute: 0.9 10*3/uL (ref 0.1–1.0)
Monocytes Relative: 11 %
Neutro Abs: 5.4 10*3/uL (ref 1.7–7.7)
Neutrophils Relative %: 69 %
Platelets: 206 10*3/uL (ref 150–400)
RBC: 4.51 MIL/uL (ref 4.22–5.81)
RDW: 14.2 % (ref 11.5–15.5)
WBC: 7.8 10*3/uL (ref 4.0–10.5)
nRBC: 0 % (ref 0.0–0.2)

## 2020-07-30 LAB — BRAIN NATRIURETIC PEPTIDE: B Natriuretic Peptide: 371.7 pg/mL — ABNORMAL HIGH (ref 0.0–100.0)

## 2020-07-30 LAB — GLUCOSE, CAPILLARY
Glucose-Capillary: 98 mg/dL (ref 70–99)
Glucose-Capillary: 210 mg/dL — ABNORMAL HIGH (ref 70–99)

## 2020-07-30 LAB — RESP PANEL BY RT-PCR (FLU A&B, COVID) ARPGX2
Influenza A by PCR: NEGATIVE
Influenza B by PCR: NEGATIVE
SARS Coronavirus 2 by RT PCR: NEGATIVE

## 2020-07-30 LAB — TROPONIN I (HIGH SENSITIVITY)
Troponin I (High Sensitivity): 17 ng/L (ref <18)
Troponin I (High Sensitivity): 17 ng/L (ref <18)

## 2020-07-30 LAB — CBG MONITORING, ED: Glucose-Capillary: 125 mg/dL — ABNORMAL HIGH (ref 70–99)

## 2020-07-30 LAB — HEMOGLOBIN A1C
Hgb A1c MFr Bld: 6.5 % — ABNORMAL HIGH (ref 4.8–5.6)
Mean Plasma Glucose: 140 mg/dL

## 2020-07-30 MED ORDER — POTASSIUM CHLORIDE CRYS ER 20 MEQ PO TBCR
20.0000 meq | EXTENDED_RELEASE_TABLET | Freq: Two times a day (BID) | ORAL | Status: DC
  Administered 2020-07-30 – 2020-08-02 (×6): 20 meq via ORAL
  Filled 2020-07-30 (×6): qty 1

## 2020-07-30 MED ORDER — ACETAMINOPHEN 325 MG PO TABS: 650.0000 mg | ORAL_TABLET | Freq: Four times a day (QID) | ORAL | Status: DC | PRN

## 2020-07-30 MED ORDER — INSULIN ASPART 100 UNIT/ML ~~LOC~~ SOLN
0.0000 [IU] | Freq: Every day | SUBCUTANEOUS | Status: DC
  Administered 2020-07-30: 2 [IU] via SUBCUTANEOUS

## 2020-07-30 MED ORDER — DOXAZOSIN MESYLATE 8 MG PO TABS
8.0000 mg | ORAL_TABLET | Freq: Every morning | ORAL | Status: DC
  Administered 2020-07-31 – 2020-08-03 (×4): 8 mg via ORAL
  Filled 2020-07-30 (×4): qty 1

## 2020-07-30 MED ORDER — FUROSEMIDE 10 MG/ML IJ SOLN
40.0000 mg | Freq: Once | INTRAMUSCULAR | Status: AC
  Administered 2020-07-30: 40 mg via INTRAVENOUS
  Filled 2020-07-30: qty 4

## 2020-07-30 MED ORDER — APIXABAN 2.5 MG PO TABS
2.5000 mg | ORAL_TABLET | Freq: Two times a day (BID) | ORAL | Status: DC
  Administered 2020-07-30 – 2020-08-03 (×8): 2.5 mg via ORAL
  Filled 2020-07-30 (×9): qty 1

## 2020-07-30 MED ORDER — OLMESARTAN-AMLODIPINE-HCTZ 20-5-12.5 MG PO TABS: 1.0000 | ORAL_TABLET | Freq: Every day | ORAL | Status: DC

## 2020-07-30 MED ORDER — ONDANSETRON HCL 4 MG/2ML IJ SOLN
4.0000 mg | Freq: Four times a day (QID) | INTRAMUSCULAR | Status: DC | PRN
Start: 2020-07-30 — End: 2020-08-03

## 2020-07-30 MED ORDER — METOPROLOL SUCCINATE ER 50 MG PO TB24
50.0000 mg | ORAL_TABLET | Freq: Every morning | ORAL | Status: DC
  Administered 2020-07-31 – 2020-08-03 (×4): 50 mg via ORAL
  Filled 2020-07-30 (×4): qty 1

## 2020-07-30 MED ORDER — INSULIN ASPART 100 UNIT/ML ~~LOC~~ SOLN
0.0000 [IU] | Freq: Three times a day (TID) | SUBCUTANEOUS | Status: DC
  Administered 2020-07-31: 2 [IU] via SUBCUTANEOUS
  Administered 2020-07-31: 5 [IU] via SUBCUTANEOUS
  Administered 2020-08-01: 2 [IU] via SUBCUTANEOUS
  Administered 2020-08-01: 3 [IU] via SUBCUTANEOUS
  Administered 2020-08-01 – 2020-08-02 (×2): 2 [IU] via SUBCUTANEOUS
  Administered 2020-08-02 – 2020-08-03 (×3): 3 [IU] via SUBCUTANEOUS

## 2020-07-30 MED ORDER — OXYCODONE HCL 5 MG PO TABS
5.0000 mg | ORAL_TABLET | ORAL | Status: DC | PRN
Start: 2020-07-30 — End: 2020-08-03
  Administered 2020-08-02: 5 mg via ORAL
  Filled 2020-07-30: qty 1

## 2020-07-30 MED ORDER — AMLODIPINE BESYLATE 5 MG PO TABS
5.0000 mg | ORAL_TABLET | Freq: Every day | ORAL | Status: DC
  Administered 2020-07-31 – 2020-08-01 (×2): 5 mg via ORAL
  Filled 2020-07-30 (×2): qty 1

## 2020-07-30 MED ORDER — HYDROCHLOROTHIAZIDE 12.5 MG PO CAPS
12.5000 mg | ORAL_CAPSULE | Freq: Every day | ORAL | Status: DC
  Administered 2020-07-31 – 2020-08-01 (×2): 12.5 mg via ORAL
  Filled 2020-07-30 (×2): qty 1

## 2020-07-30 MED ORDER — POLYETHYLENE GLYCOL 3350 17 G PO PACK: 17.0000 g | PACK | Freq: Every day | ORAL | Status: DC | PRN

## 2020-07-30 MED ORDER — ROSUVASTATIN CALCIUM 5 MG PO TABS
5.0000 mg | ORAL_TABLET | Freq: Every day | ORAL | Status: DC
  Administered 2020-07-31 – 2020-08-03 (×4): 5 mg via ORAL
  Filled 2020-07-30 (×4): qty 1

## 2020-07-30 MED ORDER — FUROSEMIDE 10 MG/ML IJ SOLN
40.0000 mg | Freq: Two times a day (BID) | INTRAMUSCULAR | Status: DC
  Administered 2020-07-30 – 2020-08-01 (×5): 40 mg via INTRAVENOUS
  Filled 2020-07-30 (×5): qty 4

## 2020-07-30 MED ORDER — IRBESARTAN 300 MG PO TABS
150.0000 mg | ORAL_TABLET | Freq: Every day | ORAL | Status: DC
  Administered 2020-07-31 – 2020-08-01 (×2): 150 mg via ORAL
  Filled 2020-07-30 (×2): qty 1

## 2020-07-30 MED ORDER — INSULIN GLARGINE 100 UNIT/ML ~~LOC~~ SOLN
10.0000 [IU] | Freq: Every day | SUBCUTANEOUS | Status: DC
  Administered 2020-07-30 – 2020-08-02 (×4): 10 [IU] via SUBCUTANEOUS
  Filled 2020-07-30 (×5): qty 0.1

## 2020-07-30 MED ORDER — ONDANSETRON HCL 4 MG PO TABS: 4.0000 mg | ORAL_TABLET | Freq: Four times a day (QID) | ORAL | Status: DC | PRN

## 2020-07-30 MED ORDER — ACETAMINOPHEN 650 MG RE SUPP: 650.0000 mg | Freq: Four times a day (QID) | RECTAL | Status: DC | PRN

## 2020-07-30 MED ORDER — POTASSIUM CHLORIDE CRYS ER 20 MEQ PO TBCR
40.0000 meq | EXTENDED_RELEASE_TABLET | Freq: Once | ORAL | Status: AC
  Administered 2020-07-30: 40 meq via ORAL
  Filled 2020-07-30: qty 2

## 2020-07-30 NOTE — H&P (Addendum)
History and Physical    Douglas Soto FXT:[id number] DOB: [birth year] DOA: 07/30/2020  PCP: Lauree Chandler, NP  Patient coming from: home  Chief Complaint: SOB  HPI: Douglas Soto is a 79 y.o. male with medical history significant of type 2 diabetes, hypertension, gout, hyperlipidemia, atrial fibrillation, CKD 3, and CHFpef.  Over the past 2 weeks, the patient has had worsening shortness of breath.  He will nod off sitting up, but is scared to sleep due to his breathing.  He is not coughing and he denies any wheezing.  He has a history of atrial fibrillation but is not sure about CHF. He is compliant on his Eliquis.  He was on a water pill in the past but was taken off in late 2021.  He is unsure of the reason.  He also denies upper respiratory symptoms, sick contacts, chest pain, palpitations, or fevers.  Last echo was in 2019.  He also has had progressive swelling of his lower extremities over the past 3 months.  Additionally, he does not feel he is urinating as much as he should.  He has a single kidney.  His diet is poor but has not changed acutely.  He does not exercise routinely.  He does not have any calf pain and denies any history of clotting.  ED Course: Oxygen dropped to 87% on room air.  Levels came to the mid 90s on 2 L nasal cannula.  Chest x-ray showed possible pneumonia or atelectasis in the right lower lung field.  BNP 371.  Labs are otherwise stable.  He was started on Lasix IV.  Viral panel negative.  Review of Systems: As per HPI otherwise 10 point review of systems negative.   Past Medical History:  Diagnosis Date  . Cholelithiasis 29/9242   uncomplicated.    . Chronic kidney disease, stage III   . Depression   . Diabetes mellitus without complication ([place])   . Essential hypertension, malignant   . Fatty liver 02/2018   on ultrasound  . Gout, unspecified   . Hyperlipidemia   . Lumbago   . Obesity, unspecified   . Pain in joint, pelvic region and thigh   .  Pleural effusion on right 02/2018    Past Surgical History:  Procedure Laterality Date  . APPENDECTOMY    . COLONOSCOPY WITH PROPOFOL N/A 02/22/2018   Procedure: COLONOSCOPY WITH PROPOFOL;  Surgeon: Milus Banister, MD;  Location[facility] END[facility]COPY;  Service: Endoscopy;  Laterality: N/A;  . CYST EXCISION  1998   back  . ESOPHAGOGASTRODUODENOSCOPY (EGD) WITH PROPOFOL N/A 02/22/2018   Procedure: ESOPHAGOGASTRODUODENOSCOPY (EGD) WITH PROPOFOL;  Surgeon: Milus Banister, MD;  Location[facility] ENDOSCOPY;  Service: Endoscopy;  Laterality: N/A;  . POLYPECTOMY  02/22/2018   Procedure: POLYPECTOMY;  Surgeon: Milus Banister, MD;  Location: [place];  Service: Endoscopy;;  . 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 His smokeless tobacco use includes chew. He reports current alcohol use of about 1.0 standard drink of alcohol per week. He reports that he does not use drugs.  Allergies  Allergen Reactions  . Allopurinol Other (See Comments)    Welts in the body and and blisters in the mouth    Family History  Problem Relation Age of Onset  . Heart Problems Father   . Heart disease Brother   . Diabetes Brother   . Diabetes Brother   .  Heart Problems Mother   . Pneumonia Maternal Grandfather     Prior to Admission medications   Medication Sig Start Date End Date Taking? Authorizing Provider  doxazosin (CARDURA) 8 MG tablet TAKE 1 TABLET BY MOUTH ONCE DAILY Patient taking differently: Take 8 mg by mouth in the morning. 02/27/20  Yes Eubanks, Carlos American, NP  ELIQUIS 2.5 MG TABS tablet TAKE 1 TABLET(2.5 MG) BY MOUTH TWICE DAILY Patient taking differently: Take 2.5 mg by mouth 2 (two) times daily. 02/27/20  Yes Lauree Chandler, NP  glipiZIDE (GLUCOTROL) 5 MG tablet TAKE 1 TABLET BY MOUTH  DAILY BEFORE BREAKFAST Patient taking differently: Take 5 mg by mouth daily before breakfast. 04/14/20  Yes Lauree Chandler, NP   metoprolol succinate (TOPROL-XL) 50 MG 24 hr tablet TAKE 1 TABLET BY MOUTH ONCE DAILY WITH OR IMMEDIATELY  FOLLOWING A MEAL Patient taking differently: Take 50 mg by mouth in the morning. WITH OR IMMEDIATELY  FOLLOWING A MEAL 02/27/20  Yes Lauree Chandler, NP  Multiple Vitamins-Minerals (CENTRUM SILVER 50+MEN) TABS Take 1 tablet by mouth daily.   Yes [provider]  Olmesartan-amLODIPine-HCTZ 20-5-12.5 MG TABS TAKE 1 TABLET BY MOUTH DAILY 12/16/19  Yes Lauree Chandler, NP  rosuvastatin (CRESTOR) 5 MG tablet TAKE 1 TABLET BY MOUTH  DAILY Patient taking differently: No sig reported 04/20/20  Yes Lauree Chandler, NP  aspirin EC 81 MG tablet Take 81 mg by mouth daily. Swallow whole. Patient not taking: Reported on 07/30/2020    [provider]  Continuous Blood Gluc Receiver (FREESTYLE LIBRE 14 DAY READER) [place] 1 each by Does not apply route as needed. Patient not taking: Reported on 07/30/2020 02/16/20   Lauree Chandler, NP  Continuous Blood Gluc Sensor (FREESTYLE LIBRE 14 DAY SENSOR) MISC 1 each by Does not apply route as needed. Patient not taking: Reported on 07/30/2020 02/16/20   Lauree Chandler, NP  dapagliflozin propanediol (FARXIGA) 10 MG TABS tablet Take 1 tablet (10 mg total) by mouth daily before breakfast. Patient not taking: Reported on 07/30/2020 01/22/20   Lauree Chandler, NP    Physical Exam: Vitals:   07/30/20 1000 07/30/20 1015 07/30/20 1030 07/30/20 1130  BP: (!) 154/68 (!) 146/117 (!) 156/80 (!) 151/73  Pulse: 79 80 83 83  Resp: 17 18 18 17   Temp:      TempSrc:      SpO2: 98% 95% 98% 98%    Constitutional: NAD, calm, comfortable Eyes: PERRL, lids and conjunctivae normal ENMT: Mucous membranes are moist. Posterior pharynx clear of any exudate or lesions. Edentulous.  Neck: normal, supple, no masses, no thyromegaly Respiratory: clear to auscultation bilaterally, no wheezing, I did not appreciate any crackles. Normal respiratory effort. No  accessory muscle use.  Cardiovascular: RRR. 3+ pitting edema of the feet, 2+ pitting edema of the lower extremities tapering at the knee.  The patient also has pitting edema of the lower abdominal region.  Of note the thighs are spared of edema.   Abdomen: no tenderness, no masses palpated. No hepatosplenomegaly. Bowel sounds positive.  Musculoskeletal: no clubbing / cyanosis. No joint deformity upper and lower extremities. Good ROM, no contractures. Normal muscle tone.  Skin: no rashes, lesions, ulcers.  Neurologic: CN 2-12 grossly intact. Sensation intact, DTR normal. Strength 5/5 in all 4.  Psychiatric: Normal judgment and insight. Alert and oriented x 3. Normal mood.   Labs on Admission: I have personally reviewed following labs and imaging studies  CBC: Recent Labs  Lab 07/30/20 0916  WBC 7.8  NEUTROABS 5.4  HGB 13.0  HCT 41.5  MCV 92.0  PLT 485   Basic Metabolic Panel: Recent Labs  Lab 07/30/20 0916  NA 141  K 3.4*  CL 102  CO2 34*  GLUCOSE 117*  BUN 25*  CREATININE 1.65*  CALCIUM 9.3   Liver Function Tests: Recent Labs  Lab 07/30/20 0916  AST 15  ALT 14  ALKPHOS 79  BILITOT 0.9  PROT 6.3*  ALBUMIN 3.3*   CBG: Recent Labs  Lab 07/30/20 0947  GLUCAP 125*   Recent Results (from the past 240 hour(s))  Resp Panel by RT-PCR (Flu A&B, Covid) Nasopharyngeal Swab     Status: None   Collection Time: 07/30/20  8:56 AM   Specimen: Nasopharyngeal Swab; Nasopharyngeal(NP) swabs in vial transport medium  Result Value Ref Range Status   SARS Coronavirus 2 by RT PCR NEGATIVE NEGATIVE Final    Comment: (NOTE) SARS-CoV-2 target nucleic acids are NOT DETECTED.  The SARS-CoV-2 RNA is generally detectable in upper respiratory specimens during the acute phase of infection. The lowest concentration of SARS-CoV-2 viral copies this assay can detect is 138 copies/mL. A negative result does not preclude SARS-Cov-2 infection and should not be used as the sole basis for  treatment or other patient management decisions. A negative result may occur with  improper specimen collection/handling, submission of specimen other than nasopharyngeal swab, presence of viral mutation(s) within the areas targeted by this assay, and inadequate number of viral copies(<138 copies/mL). A negative result must be combined with clinical observations, patient history, and epidemiological information. The expected result is Negative.  Fact Sheet for Patients:  EntrepreneurPulse.com.au  Fact Sheet for Healthcare Providers:  IncredibleEmployment.be  This test is no t yet approved or cleared by the [place] FDA and  has been authorized for detection and/or diagnosis of SARS-CoV-2 by FDA under an Emergency Use Authorization (EUA). This EUA will remain  in effect (meaning this test can be used) for the duration of the COVID-19 declaration under Section 564(b)(1) of the Act, 21 U.S.C.section 360bbb-3(b)(1), unless the authorization is terminated  or revoked sooner.       Influenza A by PCR NEGATIVE NEGATIVE Final   Influenza B by PCR NEGATIVE NEGATIVE Final    Comment: (NOTE) The Xpert Xpress SARS-CoV-2/FLU/RSV plus assay is intended as an aid in the diagnosis of influenza from Nasopharyngeal swab specimens and should not be used as a sole basis for treatment. Nasal washings and aspirates are unacceptable for Xpert Xpress SARS-CoV-2/FLU/RSV testing.  Fact Sheet for Patients: EntrepreneurPulse.com.au  Fact Sheet for Healthcare Providers: IncredibleEmployment.be  This test is not yet approved or cleared by the [place] FDA and has been authorized for detection and/or diagnosis of SARS-CoV-2 by FDA under an Emergency Use Authorization (EUA). This EUA will remain in effect (meaning this test can be used) for the duration of the COVID-19 declaration under Section 564(b)(1) of the Act, 21  U.S.C. section 360bbb-3(b)(1), unless the authorization is terminated or revoked.  Performed at [facility], [place] [address], [place], [place] [address]      Radiological Exams on Admission: DG Chest 2 View  Result Date: 07/30/2020 CLINICAL DATA:  Shortness of breath. EXAM: CHEST - 2 VIEW COMPARISON:  Multiple priors, most recent January 22, 2020. FINDINGS: Similar appearance of chronic scarring in the right middle lobe and bilateral perihilar regions. Small right pleural effusion. No visible pneumothorax. Mild enlargement of the cardiac silhouette. Calcific atherosclerosis of  the aorta. IMPRESSION: 1. Small right pleural effusion. Overlying right basilar opacity may represent atelectasis and/or pneumonia. 2. Similar appearance of chronic scarring in the right middle lobe and bilateral perihilar regions. Electronically Signed   By: Margaretha Sheffield MD   On: 07/30/2020 09:57    EKG: Independently reviewed.   Assessment/Plan Principal Problem:   Acute on chronic combined systolic and diastolic CHF (congestive heart failure) (HCC)  IV Lasix 40 mg twice daily with 20 mEq of potassium per dose  Check echo  Strict I/O's  Daily weights  Monitor BMP  As he is hemodynamically stable, will continue Toprol as outline below  Active Problems:   Hyperlipidemia  Continue Crestor 5 mg daily    Atrial fibrillation (HCC)  Continue metoprolol succinate 50 mg daily and home Eliquis 2.5 mg twice daily    DM type 2, uncontrolled, with renal complications (HCC)  Stop home med, start sliding scale insulin and Lantus 12 u qhs  Carb modified diet    CKD (chronic kidney disease) stage 3, GFR 30-59 ml/min (HCC)  Monitor labs    Essential hypertension  Continue olmesartan-amlodipine-hydrochlorothiazide combination pill 20-5-12.5 mg daily  To Cardura 8 mg daily    Acute respiratory failure (HCC)  Continue oxygen as needed  PE unlikely given compliance with Eliquis and regular heart  rate  Other than CXR, no clinical findings of PNA  Hypokalemia  Monitor BMP  Replaced in ED  DVT prophylaxis: Continue home Eliquis Code Status: Full  Family Communication: Self Disposition Plan: Home Consults called: None  Admission status: Inpatient   Severity of Illness: The appropriate patient status for this patient is INPATIENT. Inpatient status is judged to be reasonable and necessary in order to provide the required intensity of service to ensure the patient's safety. The patient's presenting symptoms, physical exam findings, and initial radiographic and laboratory data in the context of their chronic comorbidities is felt to place them at high risk for further clinical deterioration. Furthermore, it is not anticipated that the patient will be medically stable for discharge from the hospital within 2 midnights of admission. The following factors support the patient status of inpatient.   " The patient's presenting symptoms include shortness of breath. " The worrisome physical exam findings include pitting edema. " The initial radiographic and laboratory data are worrisome because of abnormalities on chest x-ray. " The chronic co-morbidities include diabetes, hypertension, atrial fibrillation, CHF, hyperlipidemia, CKD 3.   * I certify that at the point of admission it is my clinical judgment that the patient will require inpatient hospital care spanning beyond 2 midnights from the point of admission due to high intensity of service, high risk for further deterioration and high frequency of surveillance required.*   Shelda Pal, DO [facility] [email or web address] 07/30/2020, 12:15 PM

## 2020-07-30 NOTE — ED Notes (Signed)
Patient transported to X-ray 

## 2020-07-30 NOTE — ED Notes (Signed)
Pt oxygen noted to be 87% while triaging pt. Pt placed on 2L with no improvement. Oxygen increased to 4L [place]. PA at the bedside.

## 2020-07-30 NOTE — ED Provider Notes (Signed)
[facility] EMERGENCY DEPARTMENT Provider Note   CSN: [id number] Arrival date & time: 07/30/20  6568     History No chief complaint on file.   Douglas Soto is a 80 y.o. male presenting for evaluation of shortness of breath and swelling.  Patient states for the past several months, he has had gradually worsening shortness of breath and swelling.  He reports it is to the point where he is scared to fall asleep because he is concerned he may not wake up.  Swelling has gradually progressed from his feet to his abdomen.  He reports decreased urination.  He denies fevers, chills, chest pain, cough, nausea, vomiting, abdominal pain.  He does not know if he is on a diuretic.  He states one of his medicines is not refilled at his last visit, he does not remember which one.  He has been taking all of his other medications as prescribed.  He lives with his son at home.  He reports a history of previous nephrectomy and a history of previous kidney and liver problems.  Additional history obtained from chart review.  Per chart review, patients Lasix were discontinued approximately 5 months ago after patient had worsening kidney function.  History of CKD, depression, diabetes, hypertension, hyperlipidemia, obesity, persistent A. fib on Eliquis.  HPI     Past Medical History:  Diagnosis Date  . Cholelithiasis 03/7516   uncomplicated.    . Chronic kidney disease, stage II (mild)   . Depression   . Diabetes mellitus without complication ([place])   . Essential hypertension, malignant   . Fatty liver 02/2018   on ultrasound  . Gout, unspecified   . Hyperlipidemia   . Lumbago   . Obesity, unspecified   . Pain in joint, pelvic region and thigh   . Pleural effusion on right 02/2018    Patient Active Problem List   Diagnosis Date Noted  . Polyp of descending colon   . Essential hypertension 02/20/2018  . GIB (gastrointestinal bleeding) 02/19/2018  . Symptomatic anemia 02/19/2018   . Acute CHF 02/19/2018  . CKD (chronic kidney disease) stage 3, GFR 30-59 ml/min (HCC)   . Otitis externa 07/19/2016  . Edema 09/17/2015  . DM type 2, uncontrolled, with renal complications ([place]) 00/17/4944  . Atrial fibrillation ([place]) 10/15/2013  . Hip pain, bilateral 11/20/2012  . Gout   . Acute renal failure superimposed on stage 3 chronic kidney disease ([place])   . Essential hypertension, malignant   . Lumbago   . Hyperlipidemia   . Class 2 severe obesity due to excess calories with serious comorbidity and body mass index (BMI) of 36.0 to 36.9 in adult [facility])     Past Surgical History:  Procedure Laterality Date  . APPENDECTOMY    . COLONOSCOPY WITH PROPOFOL N/A 02/22/2018   Procedure: COLONOSCOPY WITH PROPOFOL;  Surgeon: Milus Banister, MD;  Location: [facility] ENDOSCOPY;  Service: Endoscopy;  Laterality: N/A;  . CYST EXCISION  1998   back  . ESOPHAGOGASTRODUODENOSCOPY (EGD) WITH PROPOFOL N/A 02/22/2018   Procedure: ESOPHAGOGASTRODUODENOSCOPY (EGD) WITH PROPOFOL;  Surgeon: Milus Banister, MD;  Location: [facility] ENDOSCOPY;  Service: Endoscopy;  Laterality: N/A;  . POLYPECTOMY  02/22/2018   Procedure: POLYPECTOMY;  Surgeon: Milus Banister, MD;  Location: [facility] ENDOSCOPY;  Service: Endoscopy;;  . 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 Heart Problems Father   .  Heart disease Brother   . Diabetes Brother   . Diabetes Brother   . Heart Problems Mother   . Pneumonia Maternal Grandfather     Social History   Tobacco Use  . Smoking status: Former Smoker    Quit date: 04/17/1972    Years since quitting: 48.3  . Smokeless tobacco: Current User    Types: Chew  Vaping Use  . Vaping Use: Never used  Substance Use Topics  . Alcohol use: Yes    Alcohol/week: 1.0 standard drink    Types: 1 Cans of beer per week    Comment: Every once in a while   . Drug use: No    Home Medications Prior to Admission medications    Medication Sig Start Date End Date Taking? Authorizing Provider  aspirin EC 81 MG tablet Take 81 mg by mouth daily. Swallow whole.    [provider]  Continuous Blood Gluc Receiver (FREESTYLE LIBRE 14 DAY READER) [place] 1 each by Does not apply route as needed. 02/16/20   Lauree Chandler, NP  Continuous Blood Gluc Sensor (FREESTYLE LIBRE 14 DAY SENSOR) MISC 1 each by Does not apply route as needed. 02/16/20   Lauree Chandler, NP  dapagliflozin propanediol (FARXIGA) 10 MG TABS tablet Take 1 tablet (10 mg total) by mouth daily before breakfast. 01/22/20   Lauree Chandler, NP  doxazosin (CARDURA) 8 MG tablet TAKE 1 TABLET BY MOUTH ONCE DAILY 02/27/20   Lauree Chandler, NP  ELIQUIS 2.5 MG TABS tablet TAKE 1 TABLET(2.5 MG) BY MOUTH TWICE DAILY 02/27/20   Lauree Chandler, NP  glipiZIDE (GLUCOTROL) 5 MG tablet TAKE 1 TABLET BY MOUTH  DAILY BEFORE BREAKFAST 04/14/20   Lauree Chandler, NP  metoprolol succinate (TOPROL-XL) 50 MG 24 hr tablet TAKE 1 TABLET BY MOUTH ONCE DAILY WITH OR IMMEDIATELY  FOLLOWING A MEAL 02/27/20   Lauree Chandler, NP  Multiple Vitamins-Minerals (CENTRUM SILVER 50+MEN) TABS Take 1 tablet by mouth daily.    [provider]  Olmesartan-amLODIPine-HCTZ 20-5-12.5 MG TABS TAKE 1 TABLET BY MOUTH DAILY 12/16/19   Lauree Chandler, NP  rosuvastatin (CRESTOR) 5 MG tablet TAKE 1 TABLET BY MOUTH  DAILY 04/20/20   Lauree Chandler, NP    Allergies    Allopurinol  Review of Systems   Review of Systems  Respiratory: Positive for shortness of breath.   Cardiovascular: Positive for leg swelling.  Hematological: Bruises/bleeds easily.  All other systems reviewed and are negative.   Physical Exam Updated Vital Signs BP (!) 154/68   Pulse 79   Temp 98.2 F (36.8 C) (Oral)   Resp 17   SpO2 98%   Physical Exam Vitals and nursing note reviewed.  Constitutional:      General: He is not in acute distress.    Appearance: He is well-developed. He is  obese.     Comments: Appears nontoxic  HENT:     Head: Normocephalic and atraumatic.  Eyes:     Conjunctiva/sclera: Conjunctivae normal.     Pupils: Pupils are equal, round, and reactive to light.  Cardiovascular:     Rate and Rhythm: Normal rate and regular rhythm.     Pulses: Normal pulses.  Pulmonary:     Effort: Pulmonary effort is normal. No respiratory distress.     Breath sounds: Normal breath sounds. No wheezing.     Comments: Speaking full sentences.  Clear lung sounds in all fields.  Sats 87% on room air, improved  to upper 90s on oxygen via nasal cannula Abdominal:     General: There is distension.     Palpations: Abdomen is soft. There is no mass.     Tenderness: There is no abdominal tenderness. There is no guarding or rebound.     Comments: Edema noted of the lower abd. No ttp of the abd  Musculoskeletal:        General: Normal range of motion.     Cervical back: Normal range of motion and neck supple.     Right lower leg: Edema present.     Left lower leg: Edema present.     Comments: Pitting edema of lower ext  Skin:    General: Skin is warm and dry.     Capillary Refill: Capillary refill takes less than 2 seconds.  Neurological:     Mental Status: He is alert and oriented to person, place, and time.     ED Results / Procedures / Treatments   Labs (all labs ordered are listed, but only abnormal results are displayed) Labs Reviewed  COMPREHENSIVE METABOLIC PANEL - Abnormal; Notable for the following components:      Result Value   Potassium 3.4 (*)    CO2 34 (*)    Glucose, Bld 117 (*)    BUN 25 (*)    Creatinine, Ser 1.65 (*)    Total Protein 6.3 (*)    Albumin 3.3 (*)    GFR, Estimated 42 (*)    All other components within normal limits  CBG MONITORING, ED - Abnormal; Notable for the following components:   Glucose-Capillary 125 (*)    All other components within normal limits  RESP PANEL BY RT-PCR (FLU A&B, COVID) ARPGX2  CBC WITH  DIFFERENTIAL/PLATELET  BRAIN NATRIURETIC PEPTIDE  TROPONIN I (HIGH SENSITIVITY)  TROPONIN I (HIGH SENSITIVITY)    EKG EKG Interpretation  Date/Time:  Friday July 30 2020 09:09:43 EDT Ventricular Rate:  96 PR Interval:    QRS Duration: 93 QT Interval:  349 QTC Calculation: 441 R Axis:   80 Text Interpretation: Atrial fibrillation Confirmed by Lacretia Leigh ([id number]) on 07/30/2020 9:19:28 AM   Radiology DG Chest 2 View  Result Date: 07/30/2020 CLINICAL DATA:  Shortness of breath. EXAM: CHEST - 2 VIEW COMPARISON:  Multiple priors, most recent January 22, 2020. FINDINGS: Similar appearance of chronic scarring in the right middle lobe and bilateral perihilar regions. Small right pleural effusion. No visible pneumothorax. Mild enlargement of the cardiac silhouette. Calcific atherosclerosis of the aorta. IMPRESSION: 1. Small right pleural effusion. Overlying right basilar opacity may represent atelectasis and/or pneumonia. 2. Similar appearance of chronic scarring in the right middle lobe and bilateral perihilar regions. Electronically Signed   By: Margaretha Sheffield MD   On: 07/30/2020 09:57    Procedures Procedures   Medications Ordered in ED Medications  furosemide (LASIX) injection 40 mg (40 mg Intravenous Given 07/30/20 0953)    ED Course  I have reviewed the triage vital signs and the nursing notes.  Pertinent labs & imaging results that were available during my care of the patient were reviewed by me and considered in my medical decision making (see chart for details).    MDM Rules/Calculators/A&P                          Patient presenting for evaluation of worsening shortness of breath and leg swelling.  On exam, patient is not in acute distress however he is  found to be mildly hypoxic on room air.  This improved with nasal cannula oxygenation.  Additionally, clinically patient appears fluid overloaded and that he has significant pitting edema of his legs extending toward the  lower abdomen.  Concern for acute on chronic heart failure, especially as patient is not currently on Lasix.  Also consider superimposed infection, ACS, AKI.  Will obtain labs, chest x-ray, EKG.  Labs interpreted by me, overall reassuring.  Creatinine is slightly improved from previous.  Hemoglobin is stable.  No leukocytosis.  EKG shows A. fib, patient has a history of persistent A. fib.  Covid is negative.  Chest x-ray viewed interpreted by me, shows small pleural effusion as well as chronic changes.  Will give Lasix and plan for admission in the setting of acute on chronic heart failure requiring oxygenation.  Discussed with Dr. Nani Ravens from [facility], patient to be admitted.  Final Clinical Impression(s) / ED Diagnoses Final diagnoses:  Acute on chronic congestive heart failure, unspecified heart failure type [facility])    Rx / DC Orders ED Discharge Orders    None       Franchot Heidelberg, PA-C 07/30/20 1132    Lacretia Leigh, MD 07/31/20 [phone]

## 2020-07-30 NOTE — ED Triage Notes (Signed)
Pt arrives pov with reports of increased shob and edema. States edema has been worsening over the last 3 months.

## 2020-07-31 ENCOUNTER — Inpatient Hospital Stay (HOSPITAL_COMMUNITY): Payer: Medicare Other

## 2020-07-31 DIAGNOSIS — I5043 Acute on chronic combined systolic (congestive) and diastolic (congestive) heart failure: Secondary | ICD-10-CM | POA: Diagnosis not present

## 2020-07-31 LAB — BASIC METABOLIC PANEL
Anion gap: 5 (ref 5–15)
BUN: 27 mg/dL — ABNORMAL HIGH (ref 8–23)
CO2: 34 mmol/L — ABNORMAL HIGH (ref 22–32)
Calcium: 9 mg/dL (ref 8.9–10.3)
Chloride: 102 mmol/L (ref 98–111)
Creatinine, Ser: 1.74 mg/dL — ABNORMAL HIGH (ref 0.61–1.24)
GFR, Estimated: 40 mL/min — ABNORMAL LOW (ref >60)
Glucose, Bld: 113 mg/dL — ABNORMAL HIGH (ref 70–99)
Potassium: 4.1 mmol/L (ref 3.5–5.1)
Sodium: 141 mmol/L (ref 135–145)

## 2020-07-31 LAB — CBC
HCT: 40.3 % (ref 39.0–52.0)
Hemoglobin: 12.5 g/dL — ABNORMAL LOW (ref 13.0–17.0)
MCH: 28.4 pg (ref 26.0–34.0)
MCHC: 31 g/dL (ref 30.0–36.0)
MCV: 91.6 fL (ref 80.0–100.0)
Platelets: 192 10*3/uL (ref 150–400)
RBC: 4.4 MIL/uL (ref 4.22–5.81)
RDW: 14 % (ref 11.5–15.5)
WBC: 7.8 10*3/uL (ref 4.0–10.5)
nRBC: 0 % (ref 0.0–0.2)

## 2020-07-31 LAB — GLUCOSE, CAPILLARY
Glucose-Capillary: 97 mg/dL (ref 70–99)
Glucose-Capillary: 231 mg/dL — ABNORMAL HIGH (ref 70–99)
Glucose-Capillary: 121 mg/dL — ABNORMAL HIGH (ref 70–99)
Glucose-Capillary: 191 mg/dL — ABNORMAL HIGH (ref 70–99)

## 2020-07-31 LAB — PROCALCITONIN: Procalcitonin: <0.1 ng/mL

## 2020-07-31 MED ORDER — LEVALBUTEROL HCL 1.25 MG/0.5ML IN NEBU
1.2500 mg | INHALATION_SOLUTION | Freq: Two times a day (BID) | RESPIRATORY_TRACT | Status: DC
  Administered 2020-07-31 – 2020-08-01 (×3): 1.25 mg via RESPIRATORY_TRACT
  Filled 2020-07-31 (×3): qty 0.5

## 2020-07-31 MED ORDER — DM-GUAIFENESIN ER 30-600 MG PO TB12: 1.0000 | ORAL_TABLET | Freq: Two times a day (BID) | ORAL | Status: DC | PRN

## 2020-07-31 MED ORDER — SENNOSIDES-DOCUSATE SODIUM 8.6-50 MG PO TABS: 1.0000 | ORAL_TABLET | Freq: Every evening | ORAL | Status: DC | PRN

## 2020-07-31 MED ORDER — IPRATROPIUM-ALBUTEROL 0.5-2.5 (3) MG/3ML IN SOLN: 3.0000 mL | RESPIRATORY_TRACT | Status: DC | PRN

## 2020-07-31 MED ORDER — IPRATROPIUM BROMIDE 0.02 % IN SOLN
0.5000 mg | Freq: Two times a day (BID) | RESPIRATORY_TRACT | Status: DC
  Administered 2020-07-31 – 2020-08-01 (×3): 0.5 mg via RESPIRATORY_TRACT
  Filled 2020-07-31 (×3): qty 2.5

## 2020-07-31 MED ORDER — ASPIRIN EC 81 MG PO TBEC
81.0000 mg | DELAYED_RELEASE_TABLET | Freq: Every day | ORAL | Status: DC
  Administered 2020-07-31 – 2020-08-03 (×4): 81 mg via ORAL
  Filled 2020-07-31 (×4): qty 1

## 2020-07-31 NOTE — Progress Notes (Signed)
PROGRESS NOTE    Douglas TRAWEEK  QIO:[id number] DOB: 02-02-1941 DOA: 07/30/2020 PCP: Lauree Chandler, NP   Brief Narrative:  80 year old with history of DM2, HTN, HLD, gout, A. fib on Eliquis, CKD stage III, CHF with preserved ejection fraction admitted for worsening shortness of breath.  Upon admission hypoxic, 87% on room air requiring 2 L nasal cannula.  Chest x-ray showed opacity in right lower lobe, BNP 371.  Viral panel negative.   Assessment & Plan:   Principal Problem:   Acute on chronic combined systolic and diastolic CHF (congestive heart failure) (HCC) Active Problems:   Hyperlipidemia   Atrial fibrillation (HCC)   DM type 2, uncontrolled, with renal complications (HCC)   CKD (chronic kidney disease) stage 3, GFR 30-59 ml/min (HCC)   Essential hypertension   Acute respiratory failure (HCC)   Acute on chronic heart failure (HCC)  Acute congestive heart failure with preserved ejection fraction, EF 55%.  Class III Acute hypoxic respiratory failure requiring 2 L nasal cannula.  87% room air -Patient is hypoxic 87% on room air.  Requiring 2 L nasal cannula -Lasix 40 mg IV twice daily, monitor electrolytes, daily weight, ins and outs -Echocardiogram-pending -Toprol-XL 50 mg -Incentive spirometer, flutter valve, bronchodilators -Check procalcitonin  Hyperlipidemia -Crestor 5 mg daily  Chronic atrial fibrillation, persistent -Eliquis.  Toprol XL 50 mg  Diabetes mellitus type 2 -Lantus 10 units bedtime -Sliding scale and Accu-Chek  CKD stage IIIa -Creatinine around baseline 1.7.  Essential hypertension -Hydrochlorothiazide, Norvasc, ibesartan, Toprol  Socially Drinks alcohol, if needed will place him on withdrawal protocol.    DVT prophylaxis: Eliquis Code Status: Full code Family Communication:  [name] updated.   Status is: Inpatient  Remains inpatient appropriate because:Inpatient level of care appropriate due to severity of illness.  Patient still  hypoxic requiring 4 L nasal cannula and IV diuretics.   Dispo: The patient is from: Home              Anticipated d/c is to: Home              Patient currently is not medically stable to d/c.   Difficult to place patient No   Subjective: Overall feels little better after overnight diuresis. But still has exertional dyspnea.   Review of Systems Otherwise negative except as per HPI, including: General: Denies fever, chills, night sweats or unintended weight loss. Resp: Denies cough, wheezing, shortness of breath. Cardiac: Denies chest pain, palpitations, orthopnea, paroxysmal nocturnal dyspnea. GI: Denies abdominal pain, nausea, vomiting, diarrhea or constipation GU: Denies dysuria, frequency, hesitancy or incontinence MS: Denies muscle aches, joint pain or swelling Neuro: Denies headache, neurologic deficits (focal weakness, numbness, tingling), abnormal gait Psych: Denies anxiety, depression, SI/HI/AVH Skin: Denies new rashes or lesions ID: Denies sick contacts, exotic exposures, travel  Examination:  General exam: Appears calm and comfortable  Respiratory system: Bibasilar crackles.  Cardiovascular system: S1 & S2 heard, RRR. No JVD, murmurs, rubs, gallops or clicks. No pedal edema. Gastrointestinal system: Abdomen is nondistended, soft and nontender. No organomegaly or masses felt. Normal bowel sounds heard. Central nervous system: Alert and oriented. No focal neurological deficits. Extremities: Symmetric 5 x 5 power. Skin: No rashes, lesions or ulcers Psychiatry: Judgement and insight appear normal. Mood & affect appropriate.     Objective: Vitals:   07/30/20 2358 07/31/20 0108 07/31/20 0435 07/31/20 0623  BP: (!) 160/68  (!) 128/38 (!) 154/80  Pulse: 80  80 82  Resp: 19  18   Temp: 98.2  F (36.8 C)  98.1 F (36.7 C)   TempSrc: Oral  Oral   SpO2: 97%  94%   Weight:  111.7 kg    Height:        Intake/Output Summary (Last 24 hours) at 07/31/2020 0735 Last data  filed at 07/31/2020 0617 Gross per 24 hour  Intake 600 ml  Output 3125 ml  Net -2525 ml   Filed Weights   07/30/20 1518 07/31/20 0108  Weight: 112.9 kg 111.7 kg     Data Reviewed:   CBC: Recent Labs  Lab 07/30/20 0916 07/31/20 0202  WBC 7.8 7.8  NEUTROABS 5.4  --   HGB 13.0 12.5*  HCT 41.5 40.3  MCV 92.0 91.6  PLT 206 536   Basic Metabolic Panel: Recent Labs  Lab 07/30/20 0916 07/31/20 0202  NA 141 141  K 3.4* 4.1  CL 102 102  CO2 34* 34*  GLUCOSE 117* 113*  BUN 25* 27*  CREATININE 1.65* 1.74*  CALCIUM 9.3 9.0   GFR: Estimated Creatinine Clearance: 42.4 mL/min (A) (by C-G formula based on SCr of 1.74 mg/dL (H)). Liver Function Tests: Recent Labs  Lab 07/30/20 0916  AST 15  ALT 14  ALKPHOS 79  BILITOT 0.9  PROT 6.3*  ALBUMIN 3.3*   No results for input(s): LIPASE, AMYLASE in the last 168 hours. No results for input(s): AMMONIA in the last 168 hours. Coagulation Profile: No results for input(s): INR, PROTIME in the last 168 hours. Cardiac Enzymes: No results for input(s): CKTOTAL, CKMB, CKMBINDEX, TROPONINI in the last 168 hours. BNP (last 3 results) No results for input(s): PROBNP in the last 8760 hours. HbA1C: Recent Labs    07/30/20 1228  HGBA1C 6.5*   CBG: Recent Labs  Lab 07/30/20 0947 07/30/20 1608 07/30/20 2105 07/31/20 0614  GLUCAP 125* 98 210* 97   Lipid Profile: No results for input(s): CHOL, HDL, LDLCALC, TRIG, CHOLHDL, LDLDIRECT in the last 72 hours. Thyroid Function Tests: No results for input(s): TSH, T4TOTAL, FREET4, T3FREE, THYROIDAB in the last 72 hours. Anemia Panel: No results for input(s): VITAMINB12, FOLATE, FERRITIN, TIBC, IRON, RETICCTPCT in the last 72 hours. Sepsis Labs: No results for input(s): PROCALCITON, LATICACIDVEN in the last 168 hours.  Recent Results (from the past 240 hour(s))  Resp Panel by RT-PCR (Flu A&B, Covid) Nasopharyngeal Swab     Status: None   Collection Time: 07/30/20  8:56 AM    Specimen: Nasopharyngeal Swab; Nasopharyngeal(NP) swabs in vial transport medium  Result Value Ref Range Status   SARS Coronavirus 2 by RT PCR NEGATIVE NEGATIVE Final    Comment: (NOTE) SARS-CoV-2 target nucleic acids are NOT DETECTED.  The SARS-CoV-2 RNA is generally detectable in upper respiratory specimens during the acute phase of infection. The lowest concentration of SARS-CoV-2 viral copies this assay can detect is 138 copies/mL. A negative result does not preclude SARS-Cov-2 infection and should not be used as the sole basis for treatment or other patient management decisions. A negative result may occur with  improper specimen collection/handling, submission of specimen other than nasopharyngeal swab, presence of viral mutation(s) within the areas targeted by this assay, and inadequate number of viral copies(<138 copies/mL). A negative result must be combined with clinical observations, patient history, and epidemiological information. The expected result is Negative.  Fact Sheet for Patients:  EntrepreneurPulse.com.au  Fact Sheet for Healthcare Providers:  IncredibleEmployment.be  This test is no t yet approved or cleared by the [place] FDA and  has been authorized for detection  and/or diagnosis of SARS-CoV-2 by FDA under an Emergency Use Authorization (EUA). This EUA will remain  in effect (meaning this test can be used) for the duration of the COVID-19 declaration under Section 564(b)(1) of the Act, 21 U.S.C.section 360bbb-3(b)(1), unless the authorization is terminated  or revoked sooner.       Influenza A by PCR NEGATIVE NEGATIVE Final   Influenza B by PCR NEGATIVE NEGATIVE Final    Comment: (NOTE) The Xpert Xpress SARS-CoV-2/FLU/RSV plus assay is intended as an aid in the diagnosis of influenza from Nasopharyngeal swab specimens and should not be used as a sole basis for treatment. Nasal washings and aspirates are  unacceptable for Xpert Xpress SARS-CoV-2/FLU/RSV testing.  Fact Sheet for Patients: EntrepreneurPulse.com.au  Fact Sheet for Healthcare Providers: IncredibleEmployment.be  This test is not yet approved or cleared by the [place] FDA and has been authorized for detection and/or diagnosis of SARS-CoV-2 by FDA under an Emergency Use Authorization (EUA). This EUA will remain in effect (meaning this test can be used) for the duration of the COVID-19 declaration under Section 564(b)(1) of the Act, 21 U.S.C. section 360bbb-3(b)(1), unless the authorization is terminated or revoked.  Performed at [facility], [place] [address] [place], [place] [address]          Radiology Studies: DG Chest 2 View  Result Date: 07/30/2020 CLINICAL DATA:  Shortness of breath. EXAM: CHEST - 2 VIEW COMPARISON:  Multiple priors, most recent January 22, 2020. FINDINGS: Similar appearance of chronic scarring in the right middle lobe and bilateral perihilar regions. Small right pleural effusion. No visible pneumothorax. Mild enlargement of the cardiac silhouette. Calcific atherosclerosis of the aorta. IMPRESSION: 1. Small right pleural effusion. Overlying right basilar opacity may represent atelectasis and/or pneumonia. 2. Similar appearance of chronic scarring in the right middle lobe and bilateral perihilar regions. Electronically Signed   By: Margaretha Sheffield MD   On: 07/30/2020 09:57        Scheduled Meds: . irbesartan  150 mg Oral Daily   And  . amLODipine  5 mg Oral Daily   And  . hydrochlorothiazide  12.5 mg Oral Daily  . apixaban  2.5 mg Oral BID  . doxazosin  8 mg Oral q AM  . furosemide  40 mg Intravenous BID  . insulin aspart  0-15 Units Subcutaneous TID WC  . insulin aspart  0-5 Units Subcutaneous QHS  . insulin glargine  10 Units Subcutaneous QHS  . metoprolol succinate  50 mg Oral q AM  . potassium chloride  20 mEq Oral BID  . rosuvastatin   5 mg Oral Daily   Continuous Infusions:   LOS: 1 day   Time spent= 35 mins    Camree Wigington Arsenio Loader, MD [facility]  If 7PM-7AM, please contact night-coverage  07/31/2020, 7:35 AM

## 2020-07-31 NOTE — Progress Notes (Signed)
  Echocardiogram 2D Echocardiogram has been performed.  Douglas Soto F 07/31/2020, 6:11 PM

## 2020-08-01 LAB — CBC
HCT: 41.6 % (ref 39.0–52.0)
Hemoglobin: 12.6 g/dL — ABNORMAL LOW (ref 13.0–17.0)
MCH: 28.1 pg (ref 26.0–34.0)
MCHC: 30.3 g/dL (ref 30.0–36.0)
MCV: 92.9 fL (ref 80.0–100.0)
Platelets: 191 10*3/uL (ref 150–400)
RBC: 4.48 MIL/uL (ref 4.22–5.81)
RDW: 14 % (ref 11.5–15.5)
WBC: 8.5 10*3/uL (ref 4.0–10.5)
nRBC: 0 % (ref 0.0–0.2)

## 2020-08-01 LAB — GLUCOSE, CAPILLARY
Glucose-Capillary: 141 mg/dL — ABNORMAL HIGH (ref 70–99)
Glucose-Capillary: 151 mg/dL — ABNORMAL HIGH (ref 70–99)
Glucose-Capillary: 148 mg/dL — ABNORMAL HIGH (ref 70–99)
Glucose-Capillary: 191 mg/dL — ABNORMAL HIGH (ref 70–99)

## 2020-08-01 LAB — ECHOCARDIOGRAM COMPLETE
AR max vel: 1.68 cm2
AV Area VTI: 1.49 cm2
AV Area mean vel: 1.64 cm2
AV Mean grad: 6 mmHg
AV Peak grad: 11.7 mmHg
Ao pk vel: 1.71 m/s
Height: 68 in
S' Lateral: 2.9 cm
Weight: 3940.8 oz

## 2020-08-01 LAB — BASIC METABOLIC PANEL
Anion gap: 8 (ref 5–15)
BUN: 29 mg/dL — ABNORMAL HIGH (ref 8–23)
CO2: 34 mmol/L — ABNORMAL HIGH (ref 22–32)
Calcium: 9.2 mg/dL (ref 8.9–10.3)
Chloride: 98 mmol/L (ref 98–111)
Creatinine, Ser: 1.89 mg/dL — ABNORMAL HIGH (ref 0.61–1.24)
GFR, Estimated: 36 mL/min — ABNORMAL LOW (ref >60)
Glucose, Bld: 195 mg/dL — ABNORMAL HIGH (ref 70–99)
Potassium: 4 mmol/L (ref 3.5–5.1)
Sodium: 140 mmol/L (ref 135–145)

## 2020-08-01 LAB — MAGNESIUM: Magnesium: 1.6 mg/dL — ABNORMAL LOW (ref 1.7–2.4)

## 2020-08-01 MED ORDER — LEVALBUTEROL HCL 1.25 MG/0.5ML IN NEBU: 1.2500 mg | INHALATION_SOLUTION | Freq: Three times a day (TID) | RESPIRATORY_TRACT | Status: DC | PRN

## 2020-08-01 NOTE — Progress Notes (Signed)
PROGRESS NOTE    Douglas Soto  OYD:[id number] DOB: 1941-01-19 DOA: 07/30/2020 PCP: Lauree Chandler, NP   Brief Narrative:  80 year old with history of DM2, HTN, HLD, gout, A. fib on Eliquis, CKD stage III, CHF with preserved ejection fraction admitted for worsening shortness of breath.  Upon admission hypoxic, 87% on room air requiring 2 L nasal cannula.  Chest x-ray showed opacity in right lower lobe, BNP 371.  Viral panel negative.  Started on IV diuretics.  Echocardiogram pending   Assessment & Plan:   Principal Problem:   Acute on chronic combined systolic and diastolic CHF (congestive heart failure) (HCC) Active Problems:   Hyperlipidemia   Atrial fibrillation (HCC)   DM type 2, uncontrolled, with renal complications (HCC)   CKD (chronic kidney disease) stage 3, GFR 30-59 ml/min (HCC)   Essential hypertension   Acute respiratory failure (HCC)   Acute on chronic heart failure (HCC)  Acute congestive heart failure with preserved ejection fraction, EF 55%.  Class III Acute hypoxic respiratory failure requiring 2 L nasal cannula.  87% room air -Patient is hypoxic 87% on room air.  Requiring 2 L nasal cannula -Lasix 40 mg IV twice daily, monitor electrolytes, daily weight, ins and outs -Echocardiogram-pending -Toprol-XL 50 mg -Incentive spirometer, flutter valve, bronchodilators -Procalcitonin-negative  Hyperlipidemia -Crestor 5 mg daily  Chronic atrial fibrillation, persistent -Eliquis.  Toprol XL 50 mg  Diabetes mellitus type 2, acceptable range -Lantus 10 units bedtime -Sliding scale and Accu-Chek  CKD stage IIIa -Creatinine around baseline 1.7.  Creatinine today 1.89 secondary to diuretics  Essential hypertension -Hydrochlorothiazide, Norvasc, ibesartan, Toprol  Socially Drinks alcohol, if needed will place him on withdrawal protocol.  Recommend outpatient sleep study  DVT prophylaxis: Eliquis Code Status: Full code Family Communication:  Daughter at  bedside.   Status is: Inpatient  Remains inpatient appropriate because:Inpatient level of care appropriate due to severity of illness.  Patient still hypoxic requiring 2 L nasal cannula and IV diuretics.  Echocardiogram currently pending   Dispo: The patient is from: Home              Anticipated d/c is to: Home              Patient currently is not medically stable to d/c.   Difficult to place patient No   Subjective: Still has some exertional sob, no chest pain   Review of Systems Otherwise negative except as per HPI, including: General = no fevers, chills, dizziness,  fatigue HEENT/EYES = negative for loss of vision, double vision, blurred vision,  sore throa Cardiovascular= negative for chest pain, palpitation Respiratory/lungs= negative for cough, wheezing; hemoptysis,  Gastrointestinal= negative for nausea, vomiting, abdominal pain Genitourinary= negative for Dysuria MSK = Negative for arthralgia, myalgias Neurology= Negative for headache, numbness, tingling  Psychiatry= Negative for suicidal and homocidal ideation Skin= Negative for Rash   Examination: Constitutional: Not in acute distress Respiratory: bibasilar crakles.  Cardiovascular: Normal sinus rhythm, no rubs Abdomen: Nontender nondistended good bowel sounds Musculoskeletal: 2+ b/l LE pitting edema.  Skin: No rashes seen Neurologic: CN 2-12 grossly intact.  And nonfocal Psychiatric: Normal judgment and insight. Alert and oriented x 3. Normal mood.       Objective: Vitals:   07/31/20 2035 07/31/20 2040 08/01/20 0050 08/01/20 0303  BP:   (!) 149/52 (!) 144/77  Pulse:   93 84  Resp:   18 19  Temp:   98.4 F (36.9 C) (!) 97.4 F (36.3 C)  TempSrc:  Oral Oral  SpO2: (!) 87% 92% 94% 97%  Weight:    111.5 kg  Height:        Intake/Output Summary (Last 24 hours) at 08/01/2020 0740 Last data filed at 08/01/2020 0616 Gross per 24 hour  Intake 1360 ml  Output 2050 ml  Net -690 ml   Filed Weights    07/30/20 1518 07/31/20 0108 08/01/20 0303  Weight: 112.9 kg 111.7 kg 111.5 kg     Data Reviewed:   CBC: Recent Labs  Lab 07/30/20 0916 07/31/20 0202 08/01/20 0322  WBC 7.8 7.8 8.5  NEUTROABS 5.4  --   --   HGB 13.0 12.5* 12.6*  HCT 41.5 40.3 41.6  MCV 92.0 91.6 92.9  PLT 206 192 497   Basic Metabolic Panel: Recent Labs  Lab 07/30/20 0916 07/31/20 0202 08/01/20 0322  NA 141 141 140  K 3.4* 4.1 4.0  CL 102 102 98  CO2 34* 34* 34*  GLUCOSE 117* 113* 195*  BUN 25* 27* 29*  CREATININE 1.65* 1.74* 1.89*  CALCIUM 9.3 9.0 9.2  MG  --   --  1.6*   GFR: Estimated Creatinine Clearance: 39 mL/min (A) (by C-G formula based on SCr of 1.89 mg/dL (H)). Liver Function Tests: Recent Labs  Lab 07/30/20 0916  AST 15  ALT 14  ALKPHOS 79  BILITOT 0.9  PROT 6.3*  ALBUMIN 3.3*   No results for input(s): LIPASE, AMYLASE in the last 168 hours. No results for input(s): AMMONIA in the last 168 hours. Coagulation Profile: No results for input(s): INR, PROTIME in the last 168 hours. Cardiac Enzymes: No results for input(s): CKTOTAL, CKMB, CKMBINDEX, TROPONINI in the last 168 hours. BNP (last 3 results) No results for input(s): PROBNP in the last 8760 hours. HbA1C: Recent Labs    07/30/20 1228  HGBA1C 6.5*   CBG: Recent Labs  Lab 07/31/20 0614 07/31/20 1153 07/31/20 1614 07/31/20 2114 08/01/20 0608  GLUCAP 97 231* 121* 191* 141*   Lipid Profile: No results for input(s): CHOL, HDL, LDLCALC, TRIG, CHOLHDL, LDLDIRECT in the last 72 hours. Thyroid Function Tests: No results for input(s): TSH, T4TOTAL, FREET4, T3FREE, THYROIDAB in the last 72 hours. Anemia Panel: No results for input(s): VITAMINB12, FOLATE, FERRITIN, TIBC, IRON, RETICCTPCT in the last 72 hours. Sepsis Labs: Recent Labs  Lab 07/31/20 0202  PROCALCITON <0.10    Recent Results (from the past 240 hour(s))  Resp Panel by RT-PCR (Flu A&B, Covid) Nasopharyngeal Swab     Status: None   Collection Time:  07/30/20  8:56 AM   Specimen: Nasopharyngeal Swab; Nasopharyngeal(NP) swabs in vial transport medium  Result Value Ref Range Status   SARS Coronavirus 2 by RT PCR NEGATIVE NEGATIVE Final    Comment: (NOTE) SARS-CoV-2 target nucleic acids are NOT DETECTED.  The SARS-CoV-2 RNA is generally detectable in upper respiratory specimens during the acute phase of infection. The lowest concentration of SARS-CoV-2 viral copies this assay can detect is 138 copies/mL. A negative result does not preclude SARS-Cov-2 infection and should not be used as the sole basis for treatment or other patient management decisions. A negative result may occur with  improper specimen collection/handling, submission of specimen other than nasopharyngeal swab, presence of viral mutation(s) within the areas targeted by this assay, and inadequate number of viral copies(<138 copies/mL). A negative result must be combined with clinical observations, patient history, and epidemiological information. The expected result is Negative.  Fact Sheet for Patients:  EntrepreneurPulse.com.au  Fact Sheet for Healthcare Providers:  IncredibleEmployment.be  This test is no t yet approved or cleared by the [place] and  has been authorized for detection and/or diagnosis of SARS-CoV-2 by FDA under an Emergency Use Authorization (EUA). This EUA will remain  in effect (meaning this test can be used) for the duration of the COVID-19 declaration under Section 564(b)(1) of the Act, 21 U.S.C.section 360bbb-3(b)(1), unless the authorization is terminated  or revoked sooner.       Influenza A by PCR NEGATIVE NEGATIVE Final   Influenza B by PCR NEGATIVE NEGATIVE Final    Comment: (NOTE) The Xpert Xpress SARS-CoV-2/FLU/RSV plus assay is intended as an aid in the diagnosis of influenza from Nasopharyngeal swab specimens and should not be used as a sole basis for treatment. Nasal washings  and aspirates are unacceptable for Xpert Xpress SARS-CoV-2/FLU/RSV testing.  Fact Sheet for Patients: EntrepreneurPulse.com.au  Fact Sheet for Healthcare Providers: IncredibleEmployment.be  This test is not yet approved or cleared by the [place] FDA and has been authorized for detection and/or diagnosis of SARS-CoV-2 by FDA under an Emergency Use Authorization (EUA). This EUA will remain in effect (meaning this test can be used) for the duration of the COVID-19 declaration under Section 564(b)(1) of the Act, 21 U.S.C. section 360bbb-3(b)(1), unless the authorization is terminated or revoked.  Performed at [facility], [place] [address] [place], [place] [address]          Radiology Studies: DG Chest 2 View  Result Date: 07/30/2020 CLINICAL DATA:  Shortness of breath. EXAM: CHEST - 2 VIEW COMPARISON:  Multiple priors, most recent January 22, 2020. FINDINGS: Similar appearance of chronic scarring in the right middle lobe and bilateral perihilar regions. Small right pleural effusion. No visible pneumothorax. Mild enlargement of the cardiac silhouette. Calcific atherosclerosis of the aorta. IMPRESSION: 1. Small right pleural effusion. Overlying right basilar opacity may represent atelectasis and/or pneumonia. 2. Similar appearance of chronic scarring in the right middle lobe and bilateral perihilar regions. Electronically Signed   By: Margaretha Sheffield MD   On: 07/30/2020 09:57        Scheduled Meds: . irbesartan  150 mg Oral Daily   And  . amLODipine  5 mg Oral Daily   And  . hydrochlorothiazide  12.5 mg Oral Daily  . apixaban  2.5 mg Oral BID  . aspirin EC  81 mg Oral Daily  . doxazosin  8 mg Oral q AM  . furosemide  40 mg Intravenous BID  . insulin aspart  0-15 Units Subcutaneous TID WC  . insulin aspart  0-5 Units Subcutaneous QHS  . insulin glargine  10 Units Subcutaneous QHS  . ipratropium  0.5 mg Nebulization BID  .  levalbuterol  1.25 mg Nebulization BID  . metoprolol succinate  50 mg Oral q AM  . potassium chloride  20 mEq Oral BID  . rosuvastatin  5 mg Oral Daily   Continuous Infusions:   LOS: 2 days   Time spent= 35 mins    Granvil Djordjevic Arsenio Loader, MD [facility]  If 7PM-7AM, please contact night-coverage  08/01/2020, 7:40 AM

## 2020-08-02 LAB — CBC
HCT: 42.5 % (ref 39.0–52.0)
Hemoglobin: 13.3 g/dL (ref 13.0–17.0)
MCH: 28.2 pg (ref 26.0–34.0)
MCHC: 31.3 g/dL (ref 30.0–36.0)
MCV: 90 fL (ref 80.0–100.0)
Platelets: 199 10*3/uL (ref 150–400)
RBC: 4.72 MIL/uL (ref 4.22–5.81)
RDW: 13.8 % (ref 11.5–15.5)
WBC: 8.7 10*3/uL (ref 4.0–10.5)
nRBC: 0 % (ref 0.0–0.2)

## 2020-08-02 LAB — GLUCOSE, CAPILLARY
Glucose-Capillary: 145 mg/dL — ABNORMAL HIGH (ref 70–99)
Glucose-Capillary: 161 mg/dL — ABNORMAL HIGH (ref 70–99)
Glucose-Capillary: 192 mg/dL — ABNORMAL HIGH (ref 70–99)
Glucose-Capillary: 184 mg/dL — ABNORMAL HIGH (ref 70–99)

## 2020-08-02 LAB — MAGNESIUM: Magnesium: 1.6 mg/dL — ABNORMAL LOW (ref 1.7–2.4)

## 2020-08-02 LAB — BASIC METABOLIC PANEL
Anion gap: 8 (ref 5–15)
Anion gap: 7 (ref 5–15)
BUN: 30 mg/dL — ABNORMAL HIGH (ref 8–23)
BUN: 34 mg/dL — ABNORMAL HIGH (ref 8–23)
CO2: 35 mmol/L — ABNORMAL HIGH (ref 22–32)
CO2: 37 mmol/L — ABNORMAL HIGH (ref 22–32)
Calcium: 9.6 mg/dL (ref 8.9–10.3)
Calcium: 9.2 mg/dL (ref 8.9–10.3)
Chloride: 94 mmol/L — ABNORMAL LOW (ref 98–111)
Chloride: 93 mmol/L — ABNORMAL LOW (ref 98–111)
Creatinine, Ser: 2.13 mg/dL — ABNORMAL HIGH (ref 0.61–1.24)
Creatinine, Ser: 2.11 mg/dL — ABNORMAL HIGH (ref 0.61–1.24)
GFR, Estimated: 31 mL/min — ABNORMAL LOW (ref >60)
GFR, Estimated: 31 mL/min — ABNORMAL LOW (ref >60)
Glucose, Bld: 122 mg/dL — ABNORMAL HIGH (ref 70–99)
Glucose, Bld: 241 mg/dL — ABNORMAL HIGH (ref 70–99)
Potassium: 3.9 mmol/L (ref 3.5–5.1)
Potassium: 4.2 mmol/L (ref 3.5–5.1)
Sodium: 137 mmol/L (ref 135–145)
Sodium: 137 mmol/L (ref 135–145)

## 2020-08-02 MED ORDER — POTASSIUM CHLORIDE CRYS ER 20 MEQ PO TBCR: 40.0000 meq | EXTENDED_RELEASE_TABLET | Freq: Once | ORAL | Status: DC

## 2020-08-02 MED ORDER — AMLODIPINE BESYLATE 10 MG PO TABS
10.0000 mg | ORAL_TABLET | Freq: Every day | ORAL | Status: DC
  Administered 2020-08-02 – 2020-08-03 (×2): 10 mg via ORAL
  Filled 2020-08-02 (×2): qty 1

## 2020-08-02 MED ORDER — MAGNESIUM SULFATE 4 GM/100ML IV SOLN
4.0000 g | Freq: Once | INTRAVENOUS | Status: AC
  Administered 2020-08-02: 4 g via INTRAVENOUS
  Filled 2020-08-02: qty 100

## 2020-08-02 MED ORDER — POTASSIUM CHLORIDE CRYS ER 20 MEQ PO TBCR
20.0000 meq | EXTENDED_RELEASE_TABLET | Freq: Once | ORAL | Status: AC
  Administered 2020-08-02: 20 meq via ORAL
  Filled 2020-08-02: qty 1

## 2020-08-02 NOTE — Progress Notes (Signed)
PROGRESS NOTE    Douglas Soto  IWL:[id number] DOB: 1940/05/05 DOA: 07/30/2020 PCP: Lauree Chandler, NP   Brief Narrative:  80 year old with history of DM2, HTN, HLD, gout, A. fib on Eliquis, CKD stage III, CHF with preserved ejection fraction admitted for worsening shortness of breath.  Upon admission hypoxic, 87% on room air requiring 2 L nasal cannula.  Chest x-ray showed opacity in right lower lobe, BNP 371.  Viral panel negative.  Started on IV diuretics.  Echocardiogram shows EF 60 to 65% with dilated cardiomyopathy.  With diuresis, creatinine trended up slightly.  Blood pressure medications were adjusted.   Assessment & Plan:   Principal Problem:   Acute on chronic combined systolic and diastolic CHF (congestive heart failure) (HCC) Active Problems:   Hyperlipidemia   Atrial fibrillation (HCC)   DM type 2, uncontrolled, with renal complications (HCC)   CKD (chronic kidney disease) stage 3, GFR 30-59 ml/min (HCC)   Essential hypertension   Acute respiratory failure (HCC)   Acute on chronic heart failure (HCC)  Acute congestive heart failure with preserved ejection fraction, EF 55%.  Class III Acute hypoxic respiratory failure requiring 2 L nasal cannula.  87% room air-now resolved -Hypoxia has now resolved. -Due to rising creatinine discontinue Lasix.  She does have lower extremity edema therefore will resume p.o. when appropriate -Echocardiogram- EF 60% with dilated cardiomyopathy -Toprol-XL 50 mg -Incentive spirometer, flutter valve, bronchodilators -Procalcitonin-negative  Acute kidney injury on CKD stage IIIa -Creatinine around baseline 1.7.  Due to diuretic use.  Creatinine today 2.13 holding Lasix, HCTZ and olmesartan.  Hyperlipidemia -Crestor 5 mg daily  Chronic atrial fibrillation, persistent -Eliquis.  Toprol XL 50 mg  Diabetes mellitus type 2, acceptable range -Lantus 10 units bedtime -Sliding scale and Accu-Chek  Essential hypertension,  uncontrolled -Continue Toprol-XL.  Increase Norvasc 10 mg daily - Discontinue hydralazine and irbesartan due to rising creatinine  Significant osteoarthritic hip pain with some ambulatory difficulty.  PT/OT ordered  Socially Drinks alcohol, if needed will place him on withdrawal protocol.  Recommend outpatient sleep study  DVT prophylaxis: Eliquis Code Status: Full code Family Communication:  Daughter at bedside.   Status is: Inpatient  Remains inpatient appropriate because:Inpatient level of care appropriate due to severity of illness.  Acute rise in creatinine, his diuretics have been held.  Requires better blood pressure control and PT/OT.   Dispo: The patient is from: Home              Anticipated d/c is to: Home              Patient currently is not medically stable to d/c.   Difficult to place patient No   Subjective: Sitting up in the chair, having trouble ambulating due to arthritic pain.  Shortness of breath has improved.  Review of Systems Otherwise negative except as per HPI, including:  General: Denies fever, chills, night sweats or unintended weight loss. Resp: Denies cough, wheezing, shortness of breath. Cardiac: Denies chest pain, palpitations, orthopnea, paroxysmal nocturnal dyspnea. GI: Denies abdominal pain, nausea, vomiting, diarrhea or constipation GU: Denies dysuria, frequency, hesitancy or incontinence MS: Denies muscle aches, joint pain or swelling Neuro: Denies headache, neurologic deficits (focal weakness, numbness, tingling), abnormal gait Psych: Denies anxiety, depression, SI/HI/AVH Skin: Denies new rashes or lesions ID: Denies sick contacts, exotic exposures, travel  Examination:  Constitutional: Not in acute distress Respiratory: Clear to auscultation bilaterally Cardiovascular: Normal sinus rhythm, no rubs Abdomen: Nontender nondistended good bowel sounds Musculoskeletal: No edema noted  Skin: No rashes seen Neurologic: CN 2-12 grossly  intact.  And nonfocal Psychiatric: Normal judgment and insight. Alert and oriented x 3. Normal mood.  Objective: Vitals:   08/01/20 1157 08/01/20 1605 08/01/20 2109 08/02/20 0438  BP: 137/72 (!) 153/66 (!) 168/90 (!) 168/83  Pulse: 70 69 76 83  Resp: 20 20 17 18   Temp: (!) 97.5 F (36.4 C) 98.1 F (36.7 C) 98.9 F (37.2 C) 97.8 F (36.6 C)  TempSrc: Oral Oral Oral Oral  SpO2: 97% 95% 92% 94%  Weight:    110.4 kg  Height:        Intake/Output Summary (Last 24 hours) at 08/02/2020 0748 Last data filed at 08/02/2020 0100 Gross per 24 hour  Intake 960 ml  Output 2300 ml  Net -1340 ml   Filed Weights   07/31/20 0108 08/01/20 0303 08/02/20 0438  Weight: 111.7 kg 111.5 kg 110.4 kg     Data Reviewed:   CBC: Recent Labs  Lab 07/30/20 0916 07/31/20 0202 08/01/20 0322 08/02/20 0412  WBC 7.8 7.8 8.5 8.7  NEUTROABS 5.4  --   --   --   HGB 13.0 12.5* 12.6* 13.3  HCT 41.5 40.3 41.6 42.5  MCV 92.0 91.6 92.9 90.0  PLT 206 192 191 846   Basic Metabolic Panel: Recent Labs  Lab 07/30/20 0916 07/31/20 0202 08/01/20 0322 08/02/20 0412  NA 141 141 140 137  K 3.4* 4.1 4.0 3.9  CL 102 102 98 94*  CO2 34* 34* 34* 35*  GLUCOSE 117* 113* 195* 122*  BUN 25* 27* 29* 30*  CREATININE 1.65* 1.74* 1.89* 2.13*  CALCIUM 9.3 9.0 9.2 9.6  MG  --   --  1.6* 1.6*   GFR: Estimated Creatinine Clearance: 34.4 mL/min (A) (by C-G formula based on SCr of 2.13 mg/dL (H)). Liver Function Tests: Recent Labs  Lab 07/30/20 0916  AST 15  ALT 14  ALKPHOS 79  BILITOT 0.9  PROT 6.3*  ALBUMIN 3.3*   No results for input(s): LIPASE, AMYLASE in the last 168 hours. No results for input(s): AMMONIA in the last 168 hours. Coagulation Profile: No results for input(s): INR, PROTIME in the last 168 hours. Cardiac Enzymes: No results for input(s): CKTOTAL, CKMB, CKMBINDEX, TROPONINI in the last 168 hours. BNP (last 3 results) No results for input(s): PROBNP in the last 8760 hours. HbA1C: Recent  Labs    07/30/20 1228  HGBA1C 6.5*   CBG: Recent Labs  Lab 08/01/20 0608 08/01/20 1139 08/01/20 1601 08/01/20 2114 08/02/20 0551  GLUCAP 141* 151* 148* 191* 145*   Lipid Profile: No results for input(s): CHOL, HDL, LDLCALC, TRIG, CHOLHDL, LDLDIRECT in the last 72 hours. Thyroid Function Tests: No results for input(s): TSH, T4TOTAL, FREET4, T3FREE, THYROIDAB in the last 72 hours. Anemia Panel: No results for input(s): VITAMINB12, FOLATE, FERRITIN, TIBC, IRON, RETICCTPCT in the last 72 hours. Sepsis Labs: Recent Labs  Lab 07/31/20 0202  PROCALCITON <0.10    Recent Results (from the past 240 hour(s))  Resp Panel by RT-PCR (Flu A&B, Covid) Nasopharyngeal Swab     Status: None   Collection Time: 07/30/20  8:56 AM   Specimen: Nasopharyngeal Swab; Nasopharyngeal(NP) swabs in vial transport medium  Result Value Ref Range Status   SARS Coronavirus 2 by RT PCR NEGATIVE NEGATIVE Final    Comment: (NOTE) SARS-CoV-2 target nucleic acids are NOT DETECTED.  The SARS-CoV-2 RNA is generally detectable in upper respiratory specimens during the acute phase of infection. The lowest concentration of  SARS-CoV-2 viral copies this assay can detect is 138 copies/mL. A negative result does not preclude SARS-Cov-2 infection and should not be used as the sole basis for treatment or other patient management decisions. A negative result may occur with  improper specimen collection/handling, submission of specimen other than nasopharyngeal swab, presence of viral mutation(s) within the areas targeted by this assay, and inadequate number of viral copies(<138 copies/mL). A negative result must be combined with clinical observations, patient history, and epidemiological information. The expected result is Negative.  Fact Sheet for Patients:  EntrepreneurPulse.com.au  Fact Sheet for Healthcare Providers:  IncredibleEmployment.be  This test is no t yet approved  or cleared by the [place] FDA and  has been authorized for detection and/or diagnosis of SARS-CoV-2 by FDA under an Emergency Use Authorization (EUA). This EUA will remain  in effect (meaning this test can be used) for the duration of the COVID-19 declaration under Section 564(b)(1) of the Act, 21 U.S.C.section 360bbb-3(b)(1), unless the authorization is terminated  or revoked sooner.       Influenza A by PCR NEGATIVE NEGATIVE Final   Influenza B by PCR NEGATIVE NEGATIVE Final    Comment: (NOTE) The Xpert Xpress SARS-CoV-2/FLU/RSV plus assay is intended as an aid in the diagnosis of influenza from Nasopharyngeal swab specimens and should not be used as a sole basis for treatment. Nasal washings and aspirates are unacceptable for Xpert Xpress SARS-CoV-2/FLU/RSV testing.  Fact Sheet for Patients: EntrepreneurPulse.com.au  Fact Sheet for Healthcare Providers: IncredibleEmployment.be  This test is not yet approved or cleared by the [place] FDA and has been authorized for detection and/or diagnosis of SARS-CoV-2 by FDA under an Emergency Use Authorization (EUA). This EUA will remain in effect (meaning this test can be used) for the duration of the COVID-19 declaration under Section 564(b)(1) of the Act, 21 U.S.C. section 360bbb-3(b)(1), unless the authorization is terminated or revoked.  Performed at [facility], [place] [address], [place], [place] [address]          Radiology Studies: ECHOCARDIOGRAM COMPLETE  Result Date: 08/01/2020    ECHOCARDIOGRAM REPORT   Patient Name:   Douglas Soto Date of Exam: 07/31/2020 Medical Rec #:  [id number]      Height:       68.0 in Accession #:    [id number]     Weight:       246.3 lb Date of Birth:  1940/12/10      BSA:          2.233 m Patient Age:    72 years       BP:           116/47 mmHg Patient Gender: M              HR:           85 bpm. Exam Location:  Inpatient Procedure: 2D Echo,  Cardiac Doppler and Color Doppler Indications:    I50.9* Heart failure (unspecified)  History:        Patient has prior history of Echocardiogram examinations, most                 recent 04/18/2016. Risk Factors:Hypertension, Diabetes and                 Dyslipidemia.  Sonographer:    Merrie Roof RDCS Referring Phys: [id number] [place]  1. Left ventricular ejection fraction, by estimation, is 60 to 65%. The left ventricle has normal function.  The left ventricle has no regional wall motion abnormalities. There is mild left ventricular hypertrophy. Left ventricular diastolic parameters are indeterminate.  2. Right ventricular systolic function is low normal. The right ventricular size is mildly enlarged.  3. Left atrial size was severely dilated.  4. Right atrial size was severely dilated.  5. The mitral valve is normal in structure. No evidence of mitral valve regurgitation. No evidence of mitral stenosis.  6. The aortic valve was not well visualized. There is mild calcification of the aortic valve. There is mild thickening of the aortic valve. Aortic valve regurgitation is not visualized. No aortic stenosis is present.  7. The inferior vena cava is normal in size with <50% respiratory variability, suggesting right atrial pressure of 8 mmHg. FINDINGS  Left Ventricle: Left ventricular ejection fraction, by estimation, is 60 to 65%. The left ventricle has normal function. The left ventricle has no regional wall motion abnormalities. The left ventricular internal cavity size was normal in size. There is  mild left ventricular hypertrophy. Left ventricular diastolic parameters are indeterminate. Right Ventricle: The right ventricular size is mildly enlarged. No increase in right ventricular wall thickness. Right ventricular systolic function is low normal. Left Atrium: Left atrial size was severely dilated. Right Atrium: Right atrial size was severely dilated. Pericardium: There is no evidence of  pericardial effusion. Mitral Valve: The mitral valve is normal in structure. There is mild thickening of the mitral valve leaflet(s). There is mild calcification of the mitral valve leaflet(s). Mild mitral annular calcification. No evidence of mitral valve regurgitation. No evidence of mitral valve stenosis. Tricuspid Valve: The tricuspid valve is not well visualized. Tricuspid valve regurgitation is mild . No evidence of tricuspid stenosis. Aortic Valve: The aortic valve was not well visualized. There is mild calcification of the aortic valve. There is mild thickening of the aortic valve. There is mild aortic valve annular calcification. Aortic valve regurgitation is not visualized. No aortic stenosis is present. Aortic valve mean gradient measures 6.0 mmHg. Aortic valve peak gradient measures 11.7 mmHg. Aortic valve area, by VTI measures 1.49 cm. Pulmonic Valve: The pulmonic valve was not well visualized. Pulmonic valve regurgitation is not visualized. No evidence of pulmonic stenosis. Aorta: The aortic root is normal in size and structure. Pulmonary Artery: Severe pulmonary HTN, PASP is 70 mmHg. Venous: The inferior vena cava is normal in size with less than 50% respiratory variability, suggesting right atrial pressure of 8 mmHg. IAS/Shunts: No atrial level shunt detected by color flow Doppler.  LEFT VENTRICLE PLAX 2D LVIDd:         4.60 cm  Diastology LVIDs:         2.90 cm  LV e' lateral: 7.83 cm/s LV PW:         1.10 cm LV IVS:        1.10 cm LVOT diam:     1.80 cm LV SV:         48 LV SV Index:   22 LVOT Area:     2.54 cm  RIGHT VENTRICLE          IVC RV Basal diam:  4.20 cm  IVC diam: 2.10 cm RV Mid diam:    3.80 cm LEFT ATRIUM              Index       RIGHT ATRIUM           Index LA diam:        5.80 cm  2.60  cm/m  RA Area:     37.90 cm LA Vol (A2C):   140.0 ml 62.70 ml/m RA Volume:   170.00 ml 76.14 ml/m LA Vol (A4C):   115.0 ml 51.50 ml/m LA Biplane Vol: 134.0 ml 60.01 ml/m  AORTIC VALVE AV Area  (Vmax):    1.68 cm AV Area (Vmean):   1.64 cm AV Area (VTI):     1.49 cm AV Vmax:           171.00 cm/s AV Vmean:          112.000 cm/s AV VTI:            0.324 m AV Peak Grad:      11.7 mmHg AV Mean Grad:      6.0 mmHg LVOT Vmax:         113.00 cm/s LVOT Vmean:        72.200 cm/s LVOT VTI:          0.190 m LVOT/AV VTI ratio: 0.59  AORTA Ao Root diam: 2.60 cm TRICUSPID VALVE TR Peak grad:   62.4 mmHg TR Vmax:        395.00 cm/s  SHUNTS Systemic VTI:  0.19 m Systemic Diam: 1.80 cm Carlyle Dolly MD Electronically signed by Carlyle Dolly MD Signature Date/Time: 08/01/2020/12:58:47 PM    Final         Scheduled Meds: . amLODipine  10 mg Oral Daily  . apixaban  2.5 mg Oral BID  . aspirin EC  81 mg Oral Daily  . doxazosin  8 mg Oral q AM  . insulin aspart  0-15 Units Subcutaneous TID WC  . insulin aspart  0-5 Units Subcutaneous QHS  . insulin glargine  10 Units Subcutaneous QHS  . metoprolol succinate  50 mg Oral q AM  . potassium chloride  20 mEq Oral Once  . rosuvastatin  5 mg Oral Daily   Continuous Infusions: . magnesium sulfate bolus IVPB       LOS: 3 days   Time spent= 35 mins    Kalvin Buss Arsenio Loader, MD [facility]  If 7PM-7AM, please contact night-coverage  08/02/2020, 7:48 AM

## 2020-08-02 NOTE — Progress Notes (Signed)
  Mobility Specialist Criteria Algorithm Info.  Mobility Team: HOB elevated: Activity: Ambulated in room; Dangled on edge of bed (Declined hallway ambulation) Range of motion: Active; All extremities Level of assistance: Standby assist, set-up cues, supervision of patient - no hands on Assistive device: None Minutes sitting in chair:  Minutes stood: 2 minutes Minutes ambulated: 2 minutes Distance ambulated (ft): 25 ft Mobility response: Tolerated well Bed Position: Semi-fowlers  Patient up ambulating in room upon CMS arrival. Explained he is independent and does well with ambulation, transfers, and ADL's. Deferred further mobility at this time as he just ambulated with therapy not long ago. Will check back as time permits.   08/02/2020 11:03 AM

## 2020-08-02 NOTE — Evaluation (Signed)
Physical Therapy Evaluation Patient Details Name: Douglas Soto MRN: [id number] DOB: 1941/04/13 Today's Date: 08/02/2020   History of Present Illness  Patient is a 80 y/o male who presents on 07/30/20 with SOB and edema. CXR- possible PNA or atelectasis RLL. Admitted with acute on chronic CHF. PMH includes gout, HTN, CKD, DM.  Clinical Impression  Patient presents with dyspnea on exertion, chronic pain, decreased cardiovascular endurance and impaired mobility s/p above. Pt lives at home with son and reports being independent with ADLs and walking PTA. Limited walking distances due to chronic bil hip pain- recently taken off pain medications by PA. Today, pt tolerated transfers and gait training with 2/4 DOE and supervision for safety. Sp02 ranged from 86-93% on RA throughout but able to rebound quickly with rest break. HR up to 130 bpm max. Encouraged walking in hallways a few times daily to improve overall endurance and strength. Will follow acutely to maximize independence and mobility prior to return home.    Follow Up Recommendations No PT follow up    Equipment Recommendations  None recommended by PT    Recommendations for Other Services       Precautions / Restrictions Precautions Precautions: Other (comment) Precaution Comments: watch 02 Restrictions Weight Bearing Restrictions: No      Mobility  Bed Mobility               General bed mobility comments: Up in chair upon PT arrival.    Transfers Overall transfer level: Modified independent Equipment used: None             General transfer comment: Stood from chair x2 without difficulty.  Ambulation/Gait Ambulation/Gait assistance: Supervision Gait Distance (Feet): 150 Feet Assistive device: None Gait Pattern/deviations: Step-through pattern;Decreased stride length   Gait velocity interpretation: 1.31 - 2.62 ft/sec, indicative of limited community ambulator General Gait Details: Slow, steady gait with 2/4  DOE. Sp02 dropped to 86% on RA, rebounds within 1 minute to >92% with rest. HR up to 130 bpm max.  Stairs            Wheelchair Mobility    Modified Rankin (Stroke Patients Only)       Balance Overall balance assessment: Mild deficits observed, not formally tested                                           Pertinent Vitals/Pain Pain Assessment: Faces Faces Pain Scale: Hurts little more Pain Location: chronic bil hip pain Pain Descriptors / Indicators: Aching;Sore Pain Intervention(s): Monitored during session;Repositioned;Patient requesting pain meds-RN notified    Home Living Family/patient expects to be discharged to:: Private residence Living Arrangements: Children (with son) Available Help at Discharge: Family;Available PRN/intermittently Type of Home: House Home Access: Stairs to enter Entrance Stairs-Rails: None Entrance Stairs-Number of Steps: 3 Home Layout: One level Home Equipment: None      Prior Function Level of Independence: Independent         Comments: Independent for ADLs, drives, does grocery shopping. Limited walking distances due to chronic back pain- taken off opiods this year.     Hand Dominance   Dominant Hand: Left    Extremity/Trunk Assessment   Upper Extremity Assessment Upper Extremity Assessment: Defer to OT evaluation    Lower Extremity Assessment Lower Extremity Assessment: Overall WFL for tasks assessed    Cervical / Trunk Assessment Cervical / Trunk Assessment: Normal  Communication   Communication: HOH  Cognition Arousal/Alertness: Awake/alert Behavior During Therapy: WFL for tasks assessed/performed Overall Cognitive Status: Within Functional Limits for tasks assessed                                        General Comments General comments (skin integrity, edema, etc.): Sp02 ranged from 86-93% on RA throughout activity. HR up to 130 bpm max.    Exercises     Assessment/Plan     PT Assessment Patient needs continued PT services  PT Problem List Pain;Decreased activity tolerance;Cardiopulmonary status limiting activity       PT Treatment Interventions Therapeutic exercise;Patient/family education;Therapeutic activities;Functional mobility training;Stair training;Gait training;Balance training;DME instruction    PT Goals (Current goals can be found in the Care Plan section)  Acute Rehab PT Goals Patient Stated Goal: to get fluid off and pain meds back PT Goal Formulation: With patient Time For Goal Achievement: 08/16/20 Potential to Achieve Goals: Good    Frequency Min 3X/week   Barriers to discharge Inaccessible home environment stairs    Co-evaluation               AM-PAC PT "6 Clicks" Mobility  Outcome Measure Help needed turning from your back to your side while in a flat bed without using bedrails?: None Help needed moving from lying on your back to sitting on the side of a flat bed without using bedrails?: None Help needed moving to and from a bed to a chair (including a wheelchair)?: None Help needed standing up from a chair using your arms (e.g., wheelchair or bedside chair)?: A Little Help needed to walk in hospital room?: A Little Help needed climbing 3-5 steps with a railing? : A Little 6 Click Score: 21    End of Session   Activity Tolerance: Patient tolerated treatment well;Treatment limited secondary to medical complications (Comment) (drop in Sp02) Patient left: in chair;with call bell/phone within reach Nurse Communication: Mobility status;Other (comment) (02 and pt to walk in halls) PT Visit Diagnosis: Pain;Difficulty in walking, not elsewhere classified (R26.2) Pain - Right/Left:  (bil) Pain - part of body: Hip    Time: 1601-0932 PT Time Calculation (min) (ACUTE ONLY): 16 min   Charges:   PT Evaluation $PT Eval Moderate Complexity: 1 Mod          Marisa Severin, PT, DPT Acute Rehabilitation Services Pager  [phone] Office [phone]      Marguarite Arbour A Sabra Heck 08/02/2020, 9:52 AM

## 2020-08-02 NOTE — Evaluation (Signed)
Occupational Therapy Evaluation Patient Details Name: Douglas Soto MRN: [id number] DOB: [birth year] Today's Date: 08/02/2020    History of Present Illness Patient is a 80 y/o male who presents on 07/30/20 with SOB and edema. CXR- possible PNA or atelectasis RLL. Admitted with acute on chronic CHF. PMH includes gout, HTN, CKD, DM.   Clinical Impression   Pt PTA: pt's son living with him, but pt reports independence with ADL and mobility. Pt currently, with SOB and b/l hip pain at baseline. No physical assist required for ADL or mobility. pt standing at sink for shaving and light ADL in standing; pt modified independence with ADL and ADL functional mobility with no AD. Energy conservation technique education with handout provided. Pt ambulatory in room and hallway without AD ~50' with SOB and b/l hip pain. O2 >90% on RA; HR 105-110 BPM with exertion. Pt at functional baseline for ADL and mobility. No further OT skilled services required. OT signing off.    Follow Up Recommendations  No OT follow up    Equipment Recommendations  None recommended by OT    Recommendations for Other Services       Precautions / Restrictions Precautions Precautions: Other (comment) Precaution Comments: watch 02 Restrictions Weight Bearing Restrictions: No      Mobility Bed Mobility Overal bed mobility: Modified Independent             General bed mobility comments: performing supine to sitting EOB with no physical assist and no rail use    Transfers Overall transfer level: Modified independent Equipment used: None                  Balance Overall balance assessment: Modified Independent                                         ADL either performed or assessed with clinical judgement   ADL Overall ADL's : Modified independent                                       General ADL Comments: No physical assist required. pt standing at sink for shaving  and light ADL in standing. Energy conservation technique education with handout provided. Pt ambulatory in room and hallway without AD ~50' with SOB and b/l hip pain.     Vision Baseline Vision/History: Wears glasses;No visual deficits Wears Glasses: Reading only Patient Visual Report: No change from baseline Vision Assessment?: No apparent visual deficits     Perception     Praxis      Pertinent Vitals/Pain Pain Assessment: No/denies pain Faces Pain Scale: Hurts little more Pain Location: chronic bil hip pain Pain Descriptors / Indicators: Aching;Sore Pain Intervention(s): Monitored during session     Hand Dominance Left   Extremity/Trunk Assessment Upper Extremity Assessment Upper Extremity Assessment: Overall WFL for tasks assessed   Lower Extremity Assessment Lower Extremity Assessment: Overall WFL for tasks assessed   Cervical / Trunk Assessment Cervical / Trunk Assessment: Normal   Communication Communication Communication: HOH   Cognition Arousal/Alertness: Awake/alert Behavior During Therapy: WFL for tasks assessed/performed Overall Cognitive Status: Within Functional Limits for tasks assessed  General Comments  O2 >90% on RA; HR 105-110 BPM with exertion    Exercises     Shoulder Instructions      Home Living Family/patient expects to be discharged to:: Private residence Living Arrangements: Children Available Help at Discharge: Family;Available PRN/intermittently Type of Home: House Home Access: Stairs to enter [employer] of Steps: 3 Entrance Stairs-Rails: None Home Layout: One level     Bathroom Shower/Tub: Teacher, early years/pre: Standard Bathroom Accessibility: Yes   Home Equipment: None          Prior Functioning/Environment Level of Independence: Independent        Comments: Independent for ADLs, drives, does grocery shopping. Limited walking distances due  to chronic back pain- taken off opiods this year.        OT Problem List:        OT Treatment/Interventions:      OT Goals(Current goals can be found in the care plan section) Acute Rehab OT Goals Patient Stated Goal: to get fluid off and pain meds back OT Goal Formulation: All assessment and education complete, DC therapy Potential to Achieve Goals: Good  OT Frequency:     Barriers to D/C:            Co-evaluation              AM-PAC OT "6 Clicks" Daily Activity     Outcome Measure Help from another person eating meals?: None Help from another person taking care of personal grooming?: None Help from another person toileting, which includes using toliet, bedpan, or urinal?: None Help from another person bathing (including washing, rinsing, drying)?: None Help from another person to put on and taking off regular upper body clothing?: None Help from another person to put on and taking off regular lower body clothing?: None 6 Click Score: 24   End of Session Nurse Communication: Mobility status  Activity Tolerance: Patient tolerated treatment well Patient left: in chair;with call bell/phone within reach  OT Visit Diagnosis: Unsteadiness on feet (R26.81)                Time: 1641-1700 OT Time Calculation (min): 19 min Charges:  OT General Charges $OT Visit: 1 Visit OT Evaluation $OT Eval Moderate Complexity: 1 Mod  Jefferey Pica, OTR/L Acute Rehabilitation Services Pager: [phone] Office: [phone]   Harland Aguiniga C 08/02/2020, 6:11 PM

## 2020-08-03 ENCOUNTER — Other Ambulatory Visit (HOSPITAL_COMMUNITY): Payer: Self-pay

## 2020-08-03 LAB — CBC
HCT: 41.4 % (ref 39.0–52.0)
Hemoglobin: 12.8 g/dL — ABNORMAL LOW (ref 13.0–17.0)
MCH: 28.1 pg (ref 26.0–34.0)
MCHC: 30.9 g/dL (ref 30.0–36.0)
MCV: 91 fL (ref 80.0–100.0)
Platelets: 200 10*3/uL (ref 150–400)
RBC: 4.55 MIL/uL (ref 4.22–5.81)
RDW: 13.9 % (ref 11.5–15.5)
WBC: 7.6 10*3/uL (ref 4.0–10.5)
nRBC: 0 % (ref 0.0–0.2)

## 2020-08-03 LAB — BASIC METABOLIC PANEL
Anion gap: 6 (ref 5–15)
BUN: 33 mg/dL — ABNORMAL HIGH (ref 8–23)
CO2: 35 mmol/L — ABNORMAL HIGH (ref 22–32)
Calcium: 9.4 mg/dL (ref 8.9–10.3)
Chloride: 95 mmol/L — ABNORMAL LOW (ref 98–111)
Creatinine, Ser: 1.94 mg/dL — ABNORMAL HIGH (ref 0.61–1.24)
GFR, Estimated: 35 mL/min — ABNORMAL LOW (ref >60)
Glucose, Bld: 174 mg/dL — ABNORMAL HIGH (ref 70–99)
Potassium: 4.1 mmol/L (ref 3.5–5.1)
Sodium: 136 mmol/L (ref 135–145)

## 2020-08-03 LAB — GLUCOSE, CAPILLARY: Glucose-Capillary: 168 mg/dL — ABNORMAL HIGH (ref 70–99)

## 2020-08-03 LAB — MAGNESIUM: Magnesium: 2.4 mg/dL (ref 1.7–2.4)

## 2020-08-03 MED ORDER — AMLODIPINE BESYLATE 10 MG PO TABS
10.0000 mg | ORAL_TABLET | Freq: Every day | ORAL | 0 refills | Status: DC
  Filled 2020-08-03: qty 30, 30d supply, fill #0

## 2020-08-03 MED ORDER — LIDOCAINE 5 % EX PTCH
1.0000 | MEDICATED_PATCH | CUTANEOUS | Status: DC
  Administered 2020-08-03: 1 via TRANSDERMAL
  Filled 2020-08-03: qty 1

## 2020-08-03 MED ORDER — OXYCODONE HCL 5 MG PO TABS
2.5000 mg | ORAL_TABLET | Freq: Four times a day (QID) | ORAL | Status: DC | PRN
Start: 2020-08-03 — End: 2020-08-03
  Administered 2020-08-03: 2.5 mg via ORAL
  Filled 2020-08-03: qty 1

## 2020-08-03 MED ORDER — ASPIRIN 81 MG PO TBEC
81.0000 mg | DELAYED_RELEASE_TABLET | Freq: Every day | ORAL | 0 refills | Status: DC
  Filled 2020-08-03: qty 30, 30d supply, fill #0

## 2020-08-03 MED ORDER — FUROSEMIDE 40 MG PO TABS
40.0000 mg | ORAL_TABLET | Freq: Every day | ORAL | 0 refills | Status: DC
  Filled 2020-08-03: qty 30, 30d supply, fill #0

## 2020-08-03 MED ORDER — SENNOSIDES-DOCUSATE SODIUM 8.6-50 MG PO TABS
1.0000 | ORAL_TABLET | Freq: Every evening | ORAL | 0 refills | Status: DC | PRN
  Filled 2020-08-03: qty 30, 30d supply, fill #0

## 2020-08-03 MED ORDER — OXYCODONE HCL 5 MG PO TABS
2.5000 mg | ORAL_TABLET | Freq: Four times a day (QID) | ORAL | 0 refills | Status: DC | PRN
  Filled 2020-08-03: qty 15, 7d supply, fill #0

## 2020-08-03 MED ORDER — POTASSIUM CHLORIDE CRYS ER 10 MEQ PO TBCR
10.0000 meq | EXTENDED_RELEASE_TABLET | Freq: Every day | ORAL | 0 refills | Status: DC
  Filled 2020-08-03: qty 30, 30d supply, fill #0

## 2020-08-03 NOTE — Plan of Care (Signed)
  Problem: Education: Goal: Ability to demonstrate management of disease process will improve Outcome: Adequate for Discharge Goal: Ability to verbalize understanding of medication therapies will improve Outcome: Adequate for Discharge Goal: Individualized Educational Video(s) Outcome: Adequate for Discharge   Problem: Cardiac: Goal: Ability to achieve and maintain adequate cardiopulmonary perfusion will improve Outcome: Adequate for Discharge   Problem: Education: Goal: Knowledge of General Education information will improve Description: Including pain rating scale, medication(s)/side effects and non-pharmacologic comfort measures Outcome: Adequate for Discharge   Problem: Health Behavior/Discharge Planning: Goal: Ability to manage health-related needs will improve Outcome: Adequate for Discharge

## 2020-08-03 NOTE — Discharge Summary (Signed)
Physician Discharge Summary  WEBB WEED IHK:[id number] DOB: 06/08/1940 DOA: 07/30/2020  PCP: Lauree Chandler, NP  Admit date: 07/30/2020 Discharge date: 08/03/2020  Admitted From: Home Disposition: Home  Recommendations for Outpatient Follow-up:  1. Follow up with PCP in 1-2 weeks 2. Please obtain BMP/Mg in one week your next doctors visit.  3. Start amlodipine 10 mg daily, Lasix 40 mg daily with potassium supplement.  Oxycodone 2.5 mg every 6 hours as needed for severe pain with bowel regimen 4. Discontinue his olmesartan and HCTZ for now.  ARB can slowly be introduced once renal function stabilizes 5. Outpatient referral for sleep study 6. Outpatient cardiology follow-up 7. Would benefit from outpatient referral to orthopedic for his arthritic issues   Discharge Condition: Stable CODE STATUS: Full code Diet recommendation: Heart healthy  Brief/Interim Summary: 80 year old with history of DM2, HTN, HLD, gout, A. fib on Eliquis, CKD stage III, CHF with preserved ejection fraction admitted for worsening shortness of breath.  Upon admission hypoxic, 87% on room air requiring 2 L nasal cannula.  Chest x-ray showed opacity in right lower lobe, BNP 371.  Viral panel negative.  Started on IV diuretics.  Echocardiogram shows EF 60 to 65% with dilated cardiomyopathy.  With diuresis, creatinine trended up slightly.  Blood pressure medications were adjusted. Cr improved today, he feels great. Stable for Discharge. Called her daugther on the day of discharge, but no answer.    Assessment & Plan:   Principal Problem:   Acute on chronic combined systolic and diastolic CHF (congestive heart failure) (HCC) Active Problems:   Hyperlipidemia   Atrial fibrillation (HCC)   DM type 2, uncontrolled, with renal complications (HCC)   CKD (chronic kidney disease) stage 3, GFR 30-59 ml/min (HCC)   Essential hypertension   Acute respiratory failure (HCC)   Acute on chronic heart failure  (HCC)  Acute congestive heart failure with preserved ejection fraction, EF 55%.  Class II.  Improved Acute hypoxic respiratory failure requiring 2 L nasal cannula.  87% room air-now resolved -Hypoxia has now resolved. -Transition to oral Lasix daily with potassium supplements.  Will need to follow-up outpatient cardiology -Echocardiogram- EF 60% with dilated cardiomyopathy -Toprol-XL 50 mg -Incentive spirometer, flutter valve, bronchodilators -Procalcitonin-negative  Acute kidney injury on CKD stage IIIa -Creatinine around baseline 1.7.    Creatinine is now back to baseline.  Will discontinue HCTZ and olmesartan as patient will be on Lasix now.  Those medications can slowly be reintroduced as appropriate  Hyperlipidemia -Crestor 5 mg daily  Chronic atrial fibrillation, persistent -Eliquis.  Toprol XL 50 mg  Diabetes mellitus type 2, acceptable range -Lantus 10 units bedtime -Sliding scale and Accu-Chek  Essential hypertension, uncontrolled -Continue Toprol-XL.  Increase Norvasc 10 mg daily - Discontinue hydralazine and ARB.  Can slowly reintroduce ARB as appropriate  Significant osteoarthritic hip pain with some ambulatory difficulty.  PT/OT ordered  Socially Drinks alcohol, if needed will place him on withdrawal protocol.  Recommend outpatient sleep study  Body mass index is 37.37 kg/m.         Discharge Diagnoses:  Principal Problem:   Acute on chronic combined systolic and diastolic CHF (congestive heart failure) (HCC) Active Problems:   Hyperlipidemia   Atrial fibrillation (HCC)   DM type 2, uncontrolled, with renal complications (HCC)   CKD (chronic kidney disease) stage 3, GFR 30-59 ml/min (HCC)   Essential hypertension   Acute respiratory failure ([place])   Acute on chronic heart failure ([place])      Consultations:  None  Subjective: Feels great no complaints.  Ambulating without any issues.  All questions answered.  Discharge Exam: Vitals:    08/02/20 1949 08/03/20 0408  BP: 127/64 (!) 155/65  Pulse: 94 75  Resp: 20 19  Temp: 97.9 F (36.6 C) 98.7 F (37.1 C)  SpO2: 94% 91%   Vitals:   08/02/20 1949 08/03/20 0035 08/03/20 0408 08/03/20 0440  BP: 127/64  (!) 155/65   Pulse: 94  75   Resp: 20  19   Temp: 97.9 F (36.6 C)  98.7 F (37.1 C)   TempSrc: Oral  Oral   SpO2: 94%  91%   Weight:  111.5 kg  111.5 kg  Height:        General: Pt is alert, awake, not in acute distress Cardiovascular: RRR, S1/S2 +, no rubs, no gallops Respiratory: CTA bilaterally, no wheezing, no rhonchi Abdominal: Soft, NT, ND, bowel sounds + Extremities: 2+ bilateral lower extremity pitting edema, no cyanosis  Discharge Instructions  Discharge Instructions    Ambulatory referral to Sleep Studies   Complete by: As directed    Concerns for sleep apnea.     Allergies as of 08/03/2020      Reactions   Allopurinol Other (See Comments)   Welts in the body and and blisters in the mouth      Medication List    STOP taking these medications   Olmesartan-amLODIPine-HCTZ 20-5-12.5 MG Tabs     TAKE these medications   amLODipine 10 MG tablet Commonly known as: NORVASC Take 1 tablet (10 mg total) by mouth daily.   aspirin EC 81 MG tablet Take 1 tablet (81 mg total) by mouth daily. Swallow whole.   Centrum Silver 50+Men Tabs Take 1 tablet by mouth daily.   doxazosin 8 MG tablet Commonly known as: CARDURA TAKE 1 TABLET BY MOUTH ONCE DAILY What changed:   how much to take  how to take this  when to take this  additional instructions   Eliquis 2.5 MG Tabs tablet Generic drug: apixaban TAKE 1 TABLET(2.5 MG) BY MOUTH TWICE DAILY What changed: See the new instructions.   FreeStyle Libre 14 Day Reader Kerrin Mo 1 each by Does not apply route as needed.   FreeStyle Libre 14 Day Sensor Misc 1 each by Does not apply route as needed.   furosemide 40 MG tablet Commonly known as: Lasix Take 1 tablet (40 mg total) by mouth  daily.   glipiZIDE 5 MG tablet Commonly known as: GLUCOTROL TAKE 1 TABLET BY MOUTH  DAILY BEFORE BREAKFAST   metoprolol succinate 50 MG 24 hr tablet Commonly known as: TOPROL-XL TAKE 1 TABLET BY MOUTH ONCE DAILY WITH OR IMMEDIATELY  FOLLOWING A MEAL What changed:   how much to take  how to take this  when to take this  additional instructions   oxyCODONE 5 MG immediate release tablet Commonly known as: Oxy IR/ROXICODONE Take 0.5 tablets (2.5 mg total) by mouth every 6 (six) hours as needed for moderate pain or severe pain.   potassium chloride 10 MEQ tablet Commonly known as: KLOR-CON Take 1 tablet (10 mEq total) by mouth daily.   rosuvastatin 5 MG tablet Commonly known as: CRESTOR TAKE 1 TABLET BY MOUTH  DAILY   senna-docusate 8.6-50 MG tablet Commonly known as: Senokot-S Take 1 tablet by mouth at bedtime as needed for mild constipation or moderate constipation.       Follow-up Information    Lauree Chandler, NP. Schedule an appointment as soon  as possible for a visit in 1 week(s).   Specialty: Geriatric Medicine Contact information: [address] [place] [address] [phone]              Allergies  Allergen Reactions  . Allopurinol Other (See Comments)    Welts in the body and and blisters in the mouth    You were cared for by a hospitalist during your hospital stay. If you have any questions about your discharge medications or the care you received while you were in the hospital after you are discharged, you can call the unit and asked to speak with the hospitalist on call if the hospitalist that took care of you is not available. Once you are discharged, your primary care physician will handle any further medical issues. Please note that no refills for any discharge medications will be authorized once you are discharged, as it is imperative that you return to your primary care physician (or establish a relationship with a primary care  physician if you do not have one) for your aftercare needs so that they can reassess your need for medications and monitor your lab values.   Procedures/Studies: DG Chest 2 View  Result Date: 07/30/2020 CLINICAL DATA:  Shortness of breath. EXAM: CHEST - 2 VIEW COMPARISON:  Multiple priors, most recent January 22, 2020. FINDINGS: Similar appearance of chronic scarring in the right middle lobe and bilateral perihilar regions. Small right pleural effusion. No visible pneumothorax. Mild enlargement of the cardiac silhouette. Calcific atherosclerosis of the aorta. IMPRESSION: 1. Small right pleural effusion. Overlying right basilar opacity may represent atelectasis and/or pneumonia. 2. Similar appearance of chronic scarring in the right middle lobe and bilateral perihilar regions. Electronically Signed   By: Margaretha Sheffield MD   On: 07/30/2020 09:57   ECHOCARDIOGRAM COMPLETE  Result Date: 08/01/2020    ECHOCARDIOGRAM REPORT   Patient Name:   JAMILL WETMORE Date of Exam: 07/31/2020 Medical Rec #:  [id number]      Height:       68.0 in Accession #:    [id number]     Weight:       246.3 lb Date of Birth:  19-Dec-1940      BSA:          2.233 m Patient Age:    80 years       BP:           116/47 mmHg Patient Gender: M              HR:           85 bpm. Exam Location:  Inpatient Procedure: 2D Echo, Cardiac Doppler and Color Doppler Indications:    I50.9* Heart failure (unspecified)  History:        Patient has prior history of Echocardiogram examinations, most                 recent 04/18/2016. Risk Factors:Hypertension, Diabetes and                 Dyslipidemia.  Sonographer:    Merrie Roof RDCS Referring Phys: [id number] [place] O[place] Bee  1. Left ventricular ejection fraction, by estimation, is 60 to 65%. The left ventricle has normal function. The left ventricle has no regional wall motion abnormalities. There is mild left ventricular hypertrophy. Left ventricular diastolic parameters are  indeterminate.  2. Right ventricular systolic function is low normal. The right ventricular size is mildly enlarged.  3. Left atrial size was  severely dilated.  4. Right atrial size was severely dilated.  5. The mitral valve is normal in structure. No evidence of mitral valve regurgitation. No evidence of mitral stenosis.  6. The aortic valve was not well visualized. There is mild calcification of the aortic valve. There is mild thickening of the aortic valve. Aortic valve regurgitation is not visualized. No aortic stenosis is present.  7. The inferior vena cava is normal in size with <50% respiratory variability, suggesting right atrial pressure of 8 mmHg. FINDINGS  Left Ventricle: Left ventricular ejection fraction, by estimation, is 60 to 65%. The left ventricle has normal function. The left ventricle has no regional wall motion abnormalities. The left ventricular internal cavity size was normal in size. There is  mild left ventricular hypertrophy. Left ventricular diastolic parameters are indeterminate. Right Ventricle: The right ventricular size is mildly enlarged. No increase in right ventricular wall thickness. Right ventricular systolic function is low normal. Left Atrium: Left atrial size was severely dilated. Right Atrium: Right atrial size was severely dilated. Pericardium: There is no evidence of pericardial effusion. Mitral Valve: The mitral valve is normal in structure. There is mild thickening of the mitral valve leaflet(s). There is mild calcification of the mitral valve leaflet(s). Mild mitral annular calcification. No evidence of mitral valve regurgitation. No evidence of mitral valve stenosis. Tricuspid Valve: The tricuspid valve is not well visualized. Tricuspid valve regurgitation is mild . No evidence of tricuspid stenosis. Aortic Valve: The aortic valve was not well visualized. There is mild calcification of the aortic valve. There is mild thickening of the aortic valve. There is mild aortic  valve annular calcification. Aortic valve regurgitation is not visualized. No aortic stenosis is present. Aortic valve mean gradient measures 6.0 mmHg. Aortic valve peak gradient measures 11.7 mmHg. Aortic valve area, by VTI measures 1.49 cm. Pulmonic Valve: The pulmonic valve was not well visualized. Pulmonic valve regurgitation is not visualized. No evidence of pulmonic stenosis. Aorta: The aortic root is normal in size and structure. Pulmonary Artery: Severe pulmonary HTN, PASP is 70 mmHg. Venous: The inferior vena cava is normal in size with less than 50% respiratory variability, suggesting right atrial pressure of 8 mmHg. IAS/Shunts: No atrial level shunt detected by color flow Doppler.  LEFT VENTRICLE PLAX 2D LVIDd:         4.60 cm  Diastology LVIDs:         2.90 cm  LV e' lateral: 7.83 cm/s LV PW:         1.10 cm LV IVS:        1.10 cm LVOT diam:     1.80 cm LV SV:         48 LV SV Index:   22 LVOT Area:     2.54 cm  RIGHT VENTRICLE          IVC RV Basal diam:  4.20 cm  IVC diam: 2.10 cm RV Mid diam:    3.80 cm LEFT ATRIUM              Index       RIGHT ATRIUM           Index LA diam:        5.80 cm  2.60 cm/m  RA Area:     37.90 cm LA Vol (A2C):   140.0 ml 62.70 ml/m RA Volume:   170.00 ml 76.14 ml/m LA Vol (A4C):   115.0 ml 51.50 ml/m LA Biplane Vol: 134.0 ml 60.01 ml/m  AORTIC VALVE AV Area (Vmax):    1.68 cm AV Area (Vmean):   1.64 cm AV Area (VTI):     1.49 cm AV Vmax:           171.00 cm/s AV Vmean:          112.000 cm/s AV VTI:            0.324 m AV Peak Grad:      11.7 mmHg AV Mean Grad:      6.0 mmHg LVOT Vmax:         113.00 cm/s LVOT Vmean:        72.200 cm/s LVOT VTI:          0.190 m LVOT/AV VTI ratio: 0.59  AORTA Ao Root diam: 2.60 cm TRICUSPID VALVE TR Peak grad:   62.4 mmHg TR Vmax:        395.00 cm/s  SHUNTS Systemic VTI:  0.19 m Systemic Diam: 1.80 cm Carlyle Dolly MD Electronically signed by Carlyle Dolly MD Signature Date/Time: 08/01/2020/12:58:47 PM    Final       The  results of significant diagnostics from this hospitalization (including imaging, microbiology, ancillary and laboratory) are listed below for reference.     Microbiology: Recent Results (from the past 240 hour(s))  Resp Panel by RT-PCR (Flu A&B, Covid) Nasopharyngeal Swab     Status: None   Collection Time: 07/30/20  8:56 AM   Specimen: Nasopharyngeal Swab; Nasopharyngeal(NP) swabs in vial transport medium  Result Value Ref Range Status   SARS Coronavirus 2 by RT PCR NEGATIVE NEGATIVE Final    Comment: (NOTE) SARS-CoV-2 target nucleic acids are NOT DETECTED.  The SARS-CoV-2 RNA is generally detectable in upper respiratory specimens during the acute phase of infection. The lowest concentration of SARS-CoV-2 viral copies this assay can detect is 138 copies/mL. A negative result does not preclude SARS-Cov-2 infection and should not be used as the sole basis for treatment or other patient management decisions. A negative result may occur with  improper specimen collection/handling, submission of specimen other than nasopharyngeal swab, presence of viral mutation(s) within the areas targeted by this assay, and inadequate number of viral copies(<138 copies/mL). A negative result must be combined with clinical observations, patient history, and epidemiological information. The expected result is Negative.  Fact Sheet for Patients:  EntrepreneurPulse.com.au  Fact Sheet for Healthcare Providers:  IncredibleEmployment.be  This test is no t yet approved or cleared by the [place] FDA and  has been authorized for detection and/or diagnosis of SARS-CoV-2 by FDA under an Emergency Use Authorization (EUA). This EUA will remain  in effect (meaning this test can be used) for the duration of the COVID-19 declaration under Section 564(b)(1) of the Act, 21 U.S.C.section 360bbb-3(b)(1), unless the authorization is terminated  or revoked sooner.        Influenza A by PCR NEGATIVE NEGATIVE Final   Influenza B by PCR NEGATIVE NEGATIVE Final    Comment: (NOTE) The Xpert Xpress SARS-CoV-2/FLU/RSV plus assay is intended as an aid in the diagnosis of influenza from Nasopharyngeal swab specimens and should not be used as a sole basis for treatment. Nasal washings and aspirates are unacceptable for Xpert Xpress SARS-CoV-2/FLU/RSV testing.  Fact Sheet for Patients: EntrepreneurPulse.com.au  Fact Sheet for Healthcare Providers: IncredibleEmployment.be  This test is not yet approved or cleared by the [place] FDA and has been authorized for detection and/or diagnosis of SARS-CoV-2 by FDA under an Emergency Use Authorization (EUA). This EUA will remain  in effect (meaning this test can be used) for the duration of the COVID-19 declaration under Section 564(b)(1) of the Act, 21 U.S.C. section 360bbb-3(b)(1), unless the authorization is terminated or revoked.  Performed at [facility], [place] [address] [place], [place] [address]      Labs: BNP (last 3 results) Recent Labs    01/21/20 1437 07/30/20 0917  BNP 249* 268.3*   Basic Metabolic Panel: Recent Labs  Lab 07/31/20 0202 08/01/20 0322 08/02/20 0412 08/02/20 1445 08/03/20 0418  NA 141 140 137 137 136  K 4.1 4.0 3.9 4.2 4.1  CL 102 98 94* 93* 95*  CO2 34* 34* 35* 37* 35*  GLUCOSE 113* 195* 122* 241* 174*  BUN 27* 29* 30* 34* 33*  CREATININE 1.74* 1.89* 2.13* 2.11* 1.94*  CALCIUM 9.0 9.2 9.6 9.2 9.4  MG  --  1.6* 1.6*  --  2.4   Liver Function Tests: Recent Labs  Lab 07/30/20 0916  AST 15  ALT 14  ALKPHOS 79  BILITOT 0.9  PROT 6.3*  ALBUMIN 3.3*   No results for input(s): LIPASE, AMYLASE in the last 168 hours. No results for input(s): AMMONIA in the last 168 hours. CBC: Recent Labs  Lab 07/30/20 0916 07/31/20 0202 08/01/20 0322 08/02/20 0412 08/03/20 0418  WBC 7.8 7.8 8.5 8.7 7.6  NEUTROABS 5.4  --   --   --    --   HGB 13.0 12.5* 12.6* 13.3 12.8*  HCT 41.5 40.3 41.6 42.5 41.4  MCV 92.0 91.6 92.9 90.0 91.0  PLT 206 192 191 199 200   Cardiac Enzymes: No results for input(s): CKTOTAL, CKMB, CKMBINDEX, TROPONINI in the last 168 hours. BNP: Invalid input(s): POCBNP CBG: Recent Labs  Lab 08/02/20 0551 08/02/20 1138 08/02/20 1647 08/02/20 2106 08/03/20 0603  GLUCAP 145* 161* 192* 184* 168*   D-Dimer No results for input(s): DDIMER in the last 72 hours. Hgb A1c No results for input(s): HGBA1C in the last 72 hours. Lipid Profile No results for input(s): CHOL, HDL, LDLCALC, TRIG, CHOLHDL, LDLDIRECT in the last 72 hours. Thyroid function studies No results for input(s): TSH, T4TOTAL, T3FREE, THYROIDAB in the last 72 hours.  Invalid input(s): FREET3 Anemia work up No results for input(s): VITAMINB12, FOLATE, FERRITIN, TIBC, IRON, RETICCTPCT in the last 72 hours. Urinalysis    Component Value Date/Time   COLORURINE YELLOW 02/23/2018 1535   APPEARANCEUR CLEAR 02/23/2018 1535   LABSPEC 1.015 02/23/2018 1535   PHURINE 5.0 02/23/2018 1535   GLUCOSEU NEGATIVE 02/23/2018 1535   HGBUR NEGATIVE 02/23/2018 1535   BILIRUBINUR NEGATIVE 02/23/2018 1535   KETONESUR NEGATIVE 02/23/2018 1535   PROTEINUR NEGATIVE 02/23/2018 1535   NITRITE NEGATIVE 02/23/2018 1535   LEUKOCYTESUR NEGATIVE 02/23/2018 1535   Sepsis Labs Invalid input(s): PROCALCITONIN,  WBC,  LACTICIDVEN Microbiology Recent Results (from the past 240 hour(s))  Resp Panel by RT-PCR (Flu A&B, Covid) Nasopharyngeal Swab     Status: None   Collection Time: 07/30/20  8:56 AM   Specimen: Nasopharyngeal Swab; Nasopharyngeal(NP) swabs in vial transport medium  Result Value Ref Range Status   SARS Coronavirus 2 by RT PCR NEGATIVE NEGATIVE Final    Comment: (NOTE) SARS-CoV-2 target nucleic acids are NOT DETECTED.  The SARS-CoV-2 RNA is generally detectable in upper respiratory specimens during the acute phase of infection. The  lowest concentration of SARS-CoV-2 viral copies this assay can detect is 138 copies/mL. A negative result does not preclude SARS-Cov-2 infection and should not be used as the sole  basis for treatment or other patient management decisions. A negative result may occur with  improper specimen collection/handling, submission of specimen other than nasopharyngeal swab, presence of viral mutation(s) within the areas targeted by this assay, and inadequate number of viral copies(<138 copies/mL). A negative result must be combined with clinical observations, patient history, and epidemiological information. The expected result is Negative.  Fact Sheet for Patients:  EntrepreneurPulse.com.au  Fact Sheet for Healthcare Providers:  IncredibleEmployment.be  This test is no t yet approved or cleared by the [place] FDA and  has been authorized for detection and/or diagnosis of SARS-CoV-2 by FDA under an Emergency Use Authorization (EUA). This EUA will remain  in effect (meaning this test can be used) for the duration of the COVID-19 declaration under Section 564(b)(1) of the Act, 21 U.S.C.section 360bbb-3(b)(1), unless the authorization is terminated  or revoked sooner.       Influenza A by PCR NEGATIVE NEGATIVE Final   Influenza B by PCR NEGATIVE NEGATIVE Final    Comment: (NOTE) The Xpert Xpress SARS-CoV-2/FLU/RSV plus assay is intended as an aid in the diagnosis of influenza from Nasopharyngeal swab specimens and should not be used as a sole basis for treatment. Nasal washings and aspirates are unacceptable for Xpert Xpress SARS-CoV-2/FLU/RSV testing.  Fact Sheet for Patients: EntrepreneurPulse.com.au  Fact Sheet for Healthcare Providers: IncredibleEmployment.be  This test is not yet approved or cleared by the [place] FDA and has been authorized for detection and/or diagnosis of SARS-CoV-2 by FDA under  an Emergency Use Authorization (EUA). This EUA will remain in effect (meaning this test can be used) for the duration of the COVID-19 declaration under Section 564(b)(1) of the Act, 21 U.S.C. section 360bbb-3(b)(1), unless the authorization is terminated or revoked.  Performed at [facility], [place] [address] [place], [place] [address]      Time coordinating discharge:  I have spent 35 minutes face to face with the patient and on the ward discussing the patients care, assessment, plan and disposition with other care givers. >50% of the time was devoted counseling the patient about the risks and benefits of treatment/Discharge disposition and coordinating care.   SIGNED:   Damita Lack, MD  [facility] 08/03/2020, 8:04 AM   If 7PM-7AM, please contact night-coverage

## 2020-08-03 NOTE — Progress Notes (Signed)
D/C instructions given and reviewed. Tele and IV removed, tolerated well. Awaiting TOC meds.

## 2020-08-03 NOTE — Discharge Instructions (Addendum)
Heart Failure, Self-Care Heart failure is a serious condition. The following information explains things you need to do to take care of yourself at home. To help you stay as healthy as possible, you may be asked to change your diet, take certain medicines, and make other changes in your life. Your doctor may also give you more specific instructions. If you have problems or questions, call your doctor. What are the risks? Having heart failure makes it more likely for you to have some problems. These problems can get worse if you do not take good care of yourself. Problems may include:  Damage to the kidneys, liver, or lungs.  Malnutrition.  Abnormal heart rhythms.  Blood clotting problems that could cause a stroke. Supplies needed:  Scale for weighing yourself.  Blood pressure monitor.  Notebook.  Medicines. How to care for yourself when you have heart failure Medicines Take over-the-counter and prescription medicines only as told by your doctor. Take your medicines every day.  Do not stop taking your medicine unless your doctor tells you to do so.  Do not skip any medicines.  Get your prescriptions refilled before you run out of medicine. This is important.  Talk with your doctor if you cannot afford your medicines. Eating and drinking  Eat heart-healthy foods. Talk with a diet specialist (dietitian) to create an eating plan.  Limit salt (sodium) if told by your doctor. Ask your diet specialist to tell you which seasonings are healthy for your heart.  Cook in healthy ways instead of frying. Healthy ways of cooking include roasting, grilling, broiling, baking, poaching, steaming, and stir-frying.  Choose foods that: ? Have no trans fat. ? Are low in saturated fat and cholesterol.  Choose healthy foods, such as: ? Fresh or frozen fruits and vegetables. ? Fish. ? Low-fat (lean) meats. ? Legumes, such as beans, peas, and lentils. ? Fat-free or low-fat dairy  products. ? Whole-grain foods. ? High-fiber foods.  Limit how much fluid you drink, if told by your doctor.   Alcohol use  Do not drink alcohol if: ? Your doctor tells you not to drink. ? Your heart was damaged by alcohol, or you have very bad heart failure. ? You are pregnant, may be pregnant, or are planning to become pregnant.  If you drink alcohol: ? Limit how much you have to:  0-1 drink a day for women.  0-2 drinks a day for men. ? Know how much alcohol is in your drink. In the [place] one drink equals one 12 oz bottle of beer (355 mL), one 5 oz glass of wine (148 mL), or one 1 oz glass of hard liquor (44 mL). Lifestyle  Do not smoke or use any products that contain nicotine or tobacco. If you need help quitting, ask your doctor. ? Do not use nicotine gum or patches before talking to your doctor.  Do not use illegal drugs.  Lose weight if told by your doctor.  Do physical activity if told by your doctor. Talk to your doctor before you begin an exercise if: ? You are an older adult. ? You have very bad heart failure.  Learn to manage stress. If you need help, ask your doctor.  Get physical rehab (rehabilitation) to help you stay independent and to help with your quality of life.  Participate in a cardiac rehab program. This program helps you improve your health through exercise, education, and counseling.  Plan time to rest when you get tired.   Check weight   and blood pressure  Weigh yourself every day. This will help you to know if fluid is building up in your body. ? Weigh yourself every morning after you pee (urinate) and before you eat breakfast. ? Wear the same amount of clothing each time. ? Write down your daily weight. Give your record to your doctor.  Check and write down your blood pressure as told by your doctor.  Check your pulse as told by your doctor.   Dealing with very hot and very cold weather  If it is very hot: ? Avoid activities that take a  lot of energy. ? Use air conditioning or fans, or find a cooler place. ? Avoid caffeine and alcohol. ? Wear clothing that is loose-fitting, lightweight, and light-colored.  If it is very cold: ? Avoid activities that take a lot of energy. ? Layer your clothes. ? Wear mittens or gloves, a hat, and a face covering when you go outside. ? Avoid alcohol. Follow these instructions at home:  Stay up to date with shots (vaccines). Get pneumococcal and flu (influenza) shots.  Keep all follow-up visits. Contact a doctor if:  You gain 2-3 lb (1-1.4 kg) in 24 hours or 5 lb (2.3 kg) in a week.  You have increasing shortness of breath.  You cannot do your normal activities.  You get tired easily.  You cough a lot.  You do not feel like eating or feel like you may vomit (nauseous).  You have swelling in your hands, feet, ankles, or belly (abdomen).  You cannot sleep well because it is hard to breathe.  You feel like your heart is beating fast (palpitations).  You get dizzy when you stand up.  You feel depressed or sad. Get help right away if:  You have trouble breathing.  You or someone else notices a change in your behavior, such as having trouble staying awake.  You have chest pain or discomfort.  You pass out (faint). These symptoms may be an emergency. Get help right away. Call your local emergency services (911 in the [place]  Do not wait to see if the symptoms will go away.  Do not drive yourself to the hospital. Summary  Heart failure is a serious condition. To care for yourself, you may have to change your diet, take medicines, and make other lifestyle changes.  Take your medicines every day. Do not stop taking them unless your doctor tells you to do so.  Limit salt and eat heart-healthy foods.  Ask your doctor if you can drink alcohol. You may have to stop alcohol use if you have very bad heart failure.  Contact your doctor if you gain weight quickly or feel  that your heart is beating too fast. Get help right away if you pass out or have chest pain or trouble breathing. This information is not intended to replace advice given to you by your health care provider. Make sure you discuss any questions you have with your health care provider. Document Revised: 10/25/2019 Document Reviewed: 10/25/2019 Elsevier Patient Education  2021 [place].  ==========================================================================================================================================================  Information on my medicine - ELIQUIS (apixaban)  Why was Eliquis prescribed for you? Eliquis was prescribed for you to reduce the risk of a blood clot forming that can cause a stroke if you have a medical condition called atrial fibrillation (a type of irregular heartbeat).  What do You need to know about Eliquis ? Take your Eliquis TWICE DAILY - one tablet in the morning and one  tablet in the evening with or without food. If you have difficulty swallowing the tablet whole please discuss with your pharmacist how to take the medication safely.  Take Eliquis exactly as prescribed by your doctor and DO NOT stop taking Eliquis without talking to the doctor who prescribed the medication.  Stopping may increase your risk of developing a stroke.  Refill your prescription before you run out.  After discharge, you should have regular check-up appointments with your healthcare provider that is prescribing your Eliquis.  In the future your dose may need to be changed if your kidney function or weight changes by a significant amount or as you get older.  What do you do if you miss a dose? If you miss a dose, take it as soon as you remember on the same day and resume taking twice daily.  Do not take more than one dose of ELIQUIS at the same time to make up a missed dose.  Important Safety Information A possible side effect of Eliquis is bleeding. You should  call your healthcare provider right away if you experience any of the following: ? Bleeding from an injury or your nose that does not stop. ? Unusual colored urine (red or dark brown) or unusual colored stools (red or black). ? Unusual bruising for unknown reasons. ? A serious fall or if you hit your head (even if there is no bleeding).  Some medicines may interact with Eliquis and might increase your risk of bleeding or clotting while on Eliquis. To help avoid this, consult your healthcare provider or pharmacist prior to using any new prescription or non-prescription medications, including herbals, vitamins, non-steroidal anti-inflammatory drugs (NSAIDs) and supplements.  This website has more information on Eliquis (apixaban): http://www.eliquis.com/eliquis/home

## 2020-08-04 ENCOUNTER — Telehealth: Payer: Self-pay | Admitting: *Deleted

## 2020-08-04 NOTE — Telephone Encounter (Signed)
I have made the 1st attempt to contact the patient or family member in charge, in order to follow up from recently being discharged from the hospital. I left a message on voicemail but I will make another attempt at a different time.  

## 2020-08-05 NOTE — Telephone Encounter (Signed)
Transition Care Management Follow-Up Telephone Call   Date discharged and where:08/03/2020 [place]  How have you been since you were released from the hospital? Better  Any patient concerns? No  Items Reviewed:   Meds:  Y  Allergies:Y  Dietary Changes Reviewed:Y  Functional Questionnaire:  Independent-I Dependent-D  ADLs:I   Dressing- I    Eating-I   Maintaining continence-I   Transferring-I   Transportation-I   Meal Prep-I   Managing Meds- I  Confirmed importance and Date/Time of follow-up visits scheduled: 08/06/2020 with Janett Billow   Confirmed with patient if condition worsens to call PCP or go to the Emergency Dept. Patient was given office number and encouraged to call back with questions or concerns: Yes

## 2020-08-06 ENCOUNTER — Other Ambulatory Visit: Payer: Self-pay

## 2020-08-06 ENCOUNTER — Ambulatory Visit (INDEPENDENT_AMBULATORY_CARE_PROVIDER_SITE_OTHER): Payer: Medicare Other | Admitting: Nurse Practitioner

## 2020-08-06 ENCOUNTER — Encounter: Payer: Self-pay | Admitting: Nurse Practitioner

## 2020-08-06 VITALS — BP 150/78 | HR 84 | Temp 97.1°F | Ht 68.0 in | Wt 243.0 lb

## 2020-08-06 DIAGNOSIS — I10 Essential (primary) hypertension: Secondary | ICD-10-CM

## 2020-08-06 DIAGNOSIS — I4821 Permanent atrial fibrillation: Secondary | ICD-10-CM | POA: Diagnosis not present

## 2020-08-06 DIAGNOSIS — R600 Localized edema: Secondary | ICD-10-CM | POA: Diagnosis not present

## 2020-08-06 DIAGNOSIS — E785 Hyperlipidemia, unspecified: Secondary | ICD-10-CM | POA: Diagnosis not present

## 2020-08-06 DIAGNOSIS — Z6836 Body mass index (BMI) 36.0-36.9, adult: Secondary | ICD-10-CM

## 2020-08-06 DIAGNOSIS — N1832 Chronic kidney disease, stage 3b: Secondary | ICD-10-CM

## 2020-08-06 DIAGNOSIS — N179 Acute kidney failure, unspecified: Secondary | ICD-10-CM | POA: Diagnosis not present

## 2020-08-06 DIAGNOSIS — I509 Heart failure, unspecified: Secondary | ICD-10-CM | POA: Diagnosis not present

## 2020-08-06 NOTE — Patient Instructions (Signed)
To use tylenol 500 mg 1-2 tablets as needed for pain  Recommend PT  You can discuss sleep study with Dr Radford Pax- cardiologist.

## 2020-08-06 NOTE — Progress Notes (Signed)
Careteam: Patient Care Team: Lauree Chandler, NP as PCP - General (Geriatric Medicine)  PLACE OF SERVICE:  [place] Directive information Does Patient Have a Medical Advance Directive?: Yes, Type of Advance Directive: [place];Living will, Does patient want to make changes to medical advance directive?: No - Patient declined  Allergies  Allergen Reactions  . Allopurinol Other (See Comments)    Welts in the body and and blisters in the mouth    Chief Complaint  Patient presents with  . [facility] follow-up. Refill all medication to [facility]. Discuss blood sugar machine, patient states he is not testing blood sugar      HPI: Patient is a 80 y.o. male for follow up hospitalization.  Pt with a history of DM2, HTN, HLD, gout, A. fib on Eliquis, CKD stage III, CHF with preserved ejection fraction admitted to hospital for worsening shortness of breath. Upon admission hypoxic, 87% on room air requiring 2 L nasal cannula. Chest x-ray showed opacity in right lower lobe, BNP 371. Viral panel negative. Started on IV diuretics. Echocardiogram shows EF 60 to 65% with dilated cardiomyopathy. With diuresis, creatinine trended up slightly but then returned to baseline on discharge.  He did not bring his medication with him today.  He was told to increase amlodipine 10 mg daily with lasix 40 mg daily with potassium supplement Recommended follow up with cardiologist. He has been set up with Dr Radford Pax on 09/08/20- pt reports "I did not know about that" Reports he was previously seeing Dr [place] Not weighting himself at home- may get a scale today- does not have one.  Reports swelling is unchanged since hospitalization- hoping it would go down more.  Reports he has lost weight.  Daughter is helping him with diet. Making him prepared meals with low sodium.  Been eating more broccoli, green beans, fish and chicken and oatmeal.  Blood  pressure better at home- "always good until I get here" Reports 126-130s/65  DM- he is taking glipizide 5 mg daily.   Reports he is having more hip pain- feels like they need to be replaced. Not sure about therapy. "do not want to"   Review of Systems:  Review of Systems  Constitutional: Negative for chills, fever and weight loss.  HENT: Negative for tinnitus.   Respiratory: Negative for cough, sputum production and shortness of breath.   Cardiovascular: Positive for leg swelling. Negative for chest pain and palpitations.  Gastrointestinal: Negative for abdominal pain, constipation, diarrhea and heartburn.  Genitourinary: Negative for dysuria, frequency and urgency.  Musculoskeletal: Negative for back pain, falls, joint pain and myalgias.  Skin: Negative.   Neurological: Negative for dizziness and headaches.  Psychiatric/Behavioral: Negative for depression and memory loss. The patient does not have insomnia.     Past Medical History:  Diagnosis Date  . Cholelithiasis 67/6195   uncomplicated.    . Chronic kidney disease, stage II (mild)   . Depression   . Diabetes mellitus without complication ([place])   . Essential hypertension, malignant   . Fatty liver 02/2018   on ultrasound  . Gout, unspecified   . Hyperlipidemia   . Lumbago   . Obesity, unspecified   . Pain in joint, pelvic region and thigh   . Pleural effusion on right 02/2018   Past Surgical History:  Procedure Laterality Date  . APPENDECTOMY    . COLONOSCOPY WITH PROPOFOL N/A 02/22/2018   Procedure: COLONOSCOPY WITH PROPOFOL;  Surgeon: Milus Banister, MD;  Location: [facility] ENDOSCOPY;  Service: Endoscopy;  Laterality: N/A;  . CYST EXCISION  1998   back  . ESOPHAGOGASTRODUODENOSCOPY (EGD) WITH PROPOFOL N/A 02/22/2018   Procedure: ESOPHAGOGASTRODUODENOSCOPY (EGD) WITH PROPOFOL;  Surgeon: Milus Banister, MD;  Location: [facility] ENDOSCOPY;  Service: Endoscopy;  Laterality: N/A;  . POLYPECTOMY  02/22/2018   Procedure:  POLYPECTOMY;  Surgeon: Milus Banister, MD;  Location[facility] ENDOSCOPY;  Service: Endoscopy;;  . 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 His smokeless tobacco use includes chew. He reports current alcohol use of about 1.0 standard drink of alcohol per week. He reports that he does not use drugs.  Family History  Problem Relation Age of Onset  . Heart Problems Father   . Heart disease Brother   . Diabetes Brother   . Diabetes Brother   . Heart Problems Mother   . Pneumonia Maternal Grandfather     Medications: Patient's Medications  New Prescriptions   No medications on file  Previous Medications   AMLODIPINE (NORVASC) 10 MG TABLET    Take 1 tablet (10 mg total) by mouth daily.   ASPIRIN 81 MG EC TABLET    Take 1 tablet (81 mg total) by mouth daily.   CONTINUOUS BLOOD GLUC RECEIVER (FREESTYLE LIBRE 14 DAY READER) DEVI    1 each by Does not apply route as needed.   CONTINUOUS BLOOD GLUC SENSOR (FREESTYLE LIBRE 14 DAY SENSOR) MISC    1 each by Does not apply route as needed.   DOXAZOSIN (CARDURA) 8 MG TABLET    TAKE 1 TABLET BY MOUTH ONCE DAILY   ELIQUIS 2.5 MG TABS TABLET    TAKE 1 TABLET(2.5 MG) BY MOUTH TWICE DAILY   FUROSEMIDE (LASIX) 40 MG TABLET    Take 1 tablet (40 mg total) by mouth daily.   GLIPIZIDE (GLUCOTROL) 5 MG TABLET    TAKE 1 TABLET BY MOUTH  DAILY BEFORE BREAKFAST   METOPROLOL SUCCINATE (TOPROL-XL) 50 MG 24 HR TABLET    Take 50 mg by mouth daily. Take with or immediately following a meal.   MULTIPLE VITAMINS-MINERALS (CENTRUM SILVER 50+MEN) TABS    Take 1 tablet by mouth daily.   OXYCODONE (OXY IR/ROXICODONE) 5 MG IMMEDIATE RELEASE TABLET    Take 0.5 tablets (2.5 mg total) by mouth every 6 (six) hours as needed for moderate pain or severe pain.   POTASSIUM CHLORIDE (KLOR-CON) 10 MEQ TABLET    Take 1 tablet (10 mEq total) by mouth daily.   ROSUVASTATIN (CRESTOR) 5 MG TABLET    TAKE 1  TABLET BY MOUTH  DAILY   SENNA-DOCUSATE (SENOKOT-S) 8.6-50 MG TABLET    Take 1 tablet by mouth at bedtime as needed for mild constipation or moderate constipation.  Modified Medications   No medications on file  Discontinued Medications   METOPROLOL SUCCINATE (TOPROL-XL) 50 MG 24 HR TABLET    TAKE 1 TABLET BY MOUTH ONCE DAILY WITH OR IMMEDIATELY  FOLLOWING A MEAL    Physical Exam:  Vitals:   08/06/20 1519  BP: (!) 150/78  Pulse: 84  Temp: (!) 97.1 F (36.2 C)  TempSrc: Temporal  SpO2: 94%  Weight: 243 lb (110.2 kg)  Height: 5\' 8"  (1.727 m)   Body mass index is 36.95 kg/m. Wt Readings from Last 3 Encounters:  08/06/20 243 lb (110.2 kg)  08/03/20 245 lb 12.8 oz (111.5  kg)  02/13/20 238 lb 9.6 oz (108.2 kg)    Physical Exam Constitutional:      General: He is not in acute distress.    Appearance: He is well-developed. He is not diaphoretic.  HENT:     Head: Normocephalic and atraumatic.     Mouth/Throat:     Pharynx: No oropharyngeal exudate.  Eyes:     Conjunctiva/sclera: Conjunctivae normal.     Pupils: Pupils are equal, round, and reactive to light.  Cardiovascular:     Rate and Rhythm: Normal rate. Rhythm irregular.     Heart sounds: Normal heart sounds.  Pulmonary:     Effort: Pulmonary effort is normal.     Breath sounds: Normal breath sounds.  Abdominal:     General: Bowel sounds are normal.     Palpations: Abdomen is soft.  Musculoskeletal:        General: No tenderness.     Cervical back: Normal range of motion and neck supple.     Right lower leg: 2+ Pitting Edema present.     Left lower leg: 2+ Pitting Edema present.  Skin:    General: Skin is warm and dry.  Neurological:     Mental Status: He is alert and oriented to person, place, and time.    Labs reviewed: Basic Metabolic Panel: Recent Labs    01/21/20 1437 02/09/20 1403 08/01/20 0322 08/02/20 0412 08/02/20 1445 08/03/20 0418  NA 141   < > 140 137 137 136  K 3.7   < > 4.0 3.9 4.2 4.1   CL 100   < > 98 94* 93* 95*  CO2 34*   < > 34* 35* 37* 35*  GLUCOSE 123*   < > 195* 122* 241* 174*  BUN 17   < > 29* 30* 34* 33*  CREATININE 1.59*   < > 1.89* 2.13* 2.11* 1.94*  CALCIUM 9.8   < > 9.2 9.6 9.2 9.4  MG  --   --  1.6* 1.6*  --  2.4  TSH 3.04  --   --   --   --   --    < > = values in this interval not displayed.   Liver Function Tests: Recent Labs    01/21/20 1437 02/09/20 1403 07/30/20 0916  AST 10 14 15   ALT 8* 10 14  ALKPHOS  --   --  79  BILITOT 0.8 1.0 0.9  PROT 6.6 6.7 6.3*  ALBUMIN  --   --  3.3*   No results for input(s): LIPASE, AMYLASE in the last 8760 hours. No results for input(s): AMMONIA in the last 8760 hours. CBC: Recent Labs    01/21/20 1437 02/09/20 1403 07/30/20 0916 07/31/20 0202 08/01/20 0322 08/02/20 0412 08/03/20 0418  WBC 8.2 8.0 7.8   < > 8.5 8.7 7.6  NEUTROABS 5,994 5,688 5.4  --   --   --   --   HGB 13.7 13.8 13.0   < > 12.6* 13.3 12.8*  HCT 41.6 42.0 41.5   < > 41.6 42.5 41.4  MCV 88.9 89.0 92.0   < > 92.9 90.0 91.0  PLT 257 206 206   < > 191 199 200   < > = values in this interval not displayed.   Lipid Panel: Recent Labs    09/26/19 1331 01/21/20 1437 02/09/20 1403  CHOL 159 119 124  HDL 38* 32* 46  LDLCALC 91 68 62  TRIG 199* 107 82  CHOLHDL 4.2  3.7 2.7   TSH: Recent Labs    01/21/20 1437  TSH 3.04   A1C: Lab Results  Component Value Date   HGBA1C 6.5 (H) 07/30/2020     Assessment/Plan 1. Class 2 severe obesity due to excess calories with serious comorbidity and body mass index (BMI) of 36.0 to 36.9 in adult [facility]) --education provided on healthy weight loss through increase in physical activity and proper nutrition   2. Bilateral leg edema Ongoing, ?norvasc contributing to this as he has had sensitivity to increase norvasc in the past. Continues on lasix, encourage elevation and compression hose.  3. AKI (acute kidney injury) ([place]) -had improved prior to leaving hospital, will follow up at this  time - [place] GFR - Magnesium  4. Congestive heart failure, unspecified HF chronicity, unspecified heart failure type [facility]) Hospitalized for Acute on chronic heart failure, continues on oral lasix with potassium supplement and toprol. If renal function remains stable will need to add back ARB   5. Essential hypertension -elevated today in office, reports home readings have been well controlled. Will continue current regimen but if renal function stable consider adding arb.   6. Hyperlipidemia LDL goal <70 -continues on crestor 5 mg daily.   7. Stage 3b chronic kidney disease ([place]) -follow up labs today.   8. Permanent atrial fibrillation ([facility]) -rate controlled, continues on eliquis and toprol XL.   Next appt: 3 months, has follow up with cardiologist next month. Agreeable to see Dr Radford Pax.  Carlos American. [place], [facility] [phone]

## 2020-08-07 LAB — BASIC METABOLIC PANEL WITH GFR
BUN/Creatinine Ratio: 15 (calc) (ref 6–22)
BUN: 26 mg/dL — ABNORMAL HIGH (ref 7–25)
CO2: 34 mmol/L — ABNORMAL HIGH (ref 20–32)
Calcium: 9.9 mg/dL (ref 8.6–10.3)
Chloride: 96 mmol/L — ABNORMAL LOW (ref 98–110)
Creat: 1.71 mg/dL — ABNORMAL HIGH (ref 0.70–1.18)
GFR, Est African American: 43 mL/min/{1.73_m2} — ABNORMAL LOW (ref >=60)
GFR, Est Non African American: 38 mL/min/{1.73_m2} — ABNORMAL LOW (ref >=60)
Glucose, Bld: 131 mg/dL (ref 65–139)
Potassium: 4.6 mmol/L (ref 3.5–5.3)
Sodium: 138 mmol/L (ref 135–146)

## 2020-08-07 LAB — MAGNESIUM: Magnesium: 2 mg/dL (ref 1.5–2.5)

## 2020-08-24 ENCOUNTER — Other Ambulatory Visit: Payer: Self-pay | Admitting: *Deleted

## 2020-08-24 DIAGNOSIS — I4821 Permanent atrial fibrillation: Secondary | ICD-10-CM

## 2020-08-24 MED ORDER — APIXABAN 2.5 MG PO TABS: ORAL_TABLET | ORAL | 1 refills | Status: DC

## 2020-08-24 NOTE — Telephone Encounter (Signed)
Patient requested refill.  Pended Rx and sent to [facility] for approval due to HIGH ALERT Warning.  

## 2020-09-08 ENCOUNTER — Ambulatory Visit: Payer: Medicare Other | Admitting: Cardiology

## 2020-09-29 ENCOUNTER — Other Ambulatory Visit: Payer: Self-pay | Admitting: *Deleted

## 2020-09-29 DIAGNOSIS — IMO0002 Reserved for concepts with insufficient information to code with codable children: Secondary | ICD-10-CM

## 2020-09-29 MED ORDER — GLIPIZIDE 5 MG PO TABS: 5.0000 mg | ORAL_TABLET | Freq: Every day | ORAL | 1 refills | Status: DC

## 2020-09-29 NOTE — Telephone Encounter (Signed)
Pharmacy requested refill

## 2020-10-04 ENCOUNTER — Telehealth: Payer: Self-pay | Admitting: *Deleted

## 2020-10-04 MED ORDER — COLCHICINE 0.6 MG PO TABS: ORAL_TABLET | ORAL | 0 refills | Status: DC

## 2020-10-04 NOTE — Telephone Encounter (Signed)
He should not be taking indomethacin since it is an NSAID and he has Chronic kidney disease- this can make it worse. Stop Indomethacin at this time For acute gout flare lets have him take colchicine  2 tablets now and then can repeat 1 tablet in 1 hour. Need to update his uric acid level at next office visit. Low purine diet recommended.  If pain persist need to follow up in office.

## 2020-10-04 NOTE — Telephone Encounter (Signed)
Patient notified and agreed.  

## 2020-10-04 NOTE — Telephone Encounter (Signed)
Patient stated that he has gout in his feet and it hurts. Stated that he doesn't want to go another night with it hurting. No available appointment for today in office.   Patient stated that he has taken Indomethacin in the past and it helped. Patient is requesting another Rx for this.   Please Advise.

## 2020-10-22 ENCOUNTER — Telehealth: Payer: Self-pay | Admitting: Nurse Practitioner

## 2020-10-22 NOTE — Telephone Encounter (Signed)
Douglas Soto called to give a verbal consent for [place] to send necessary [place] needed for his transition of care over to Dr Francesco Sor at [facility]).  He feels with his "swellin issue" they may be able to help him & this is an opportunity to see a doctor.  He thanked [facility] for his care while he was here. New pt appt with Dr Francesco Sor 10/26/20. I have faxed most recent office visit to [place].  Thanks, Vilinda Blanks

## 2020-10-25 ENCOUNTER — Encounter: Payer: Self-pay | Admitting: *Deleted

## 2020-10-26 ENCOUNTER — Other Ambulatory Visit: Payer: Self-pay | Admitting: Nurse Practitioner

## 2020-10-26 ENCOUNTER — Other Ambulatory Visit: Payer: Medicare Other

## 2020-10-26 ENCOUNTER — Other Ambulatory Visit: Payer: Self-pay

## 2020-10-26 DIAGNOSIS — I509 Heart failure, unspecified: Secondary | ICD-10-CM

## 2020-10-26 DIAGNOSIS — E1129 Type 2 diabetes mellitus with other diabetic kidney complication: Secondary | ICD-10-CM

## 2020-10-26 DIAGNOSIS — D649 Anemia, unspecified: Secondary | ICD-10-CM

## 2020-10-26 DIAGNOSIS — IMO0002 Reserved for concepts with insufficient information to code with codable children: Secondary | ICD-10-CM

## 2020-10-26 DIAGNOSIS — E1165 Type 2 diabetes mellitus with hyperglycemia: Secondary | ICD-10-CM | POA: Diagnosis not present

## 2020-10-26 DIAGNOSIS — E785 Hyperlipidemia, unspecified: Secondary | ICD-10-CM

## 2020-10-27 LAB — CBC WITH DIFFERENTIAL/PLATELET
Absolute Monocytes: 571 cells/uL (ref 200–950)
Basophils Absolute: 27 cells/uL (ref 0–200)
Basophils Relative: 0.4 %
Eosinophils Absolute: 231 cells/uL (ref 15–500)
Eosinophils Relative: 3.4 %
HCT: 41.4 % (ref 38.5–50.0)
Hemoglobin: 13.4 g/dL (ref 13.2–17.1)
Lymphs Abs: 1013 cells/uL (ref 850–3900)
MCH: 28.3 pg (ref 27.0–33.0)
MCHC: 32.4 g/dL (ref 32.0–36.0)
MCV: 87.3 fL (ref 80.0–100.0)
MPV: 9.8 fL (ref 7.5–12.5)
Monocytes Relative: 8.4 %
Neutro Abs: 4957 cells/uL (ref 1500–7800)
Neutrophils Relative %: 72.9 %
Platelets: 190 10*3/uL (ref 140–400)
RBC: 4.74 10*6/uL (ref 4.20–5.80)
RDW: 14.3 % (ref 11.0–15.0)
Total Lymphocyte: 14.9 %
WBC: 6.8 10*3/uL (ref 3.8–10.8)

## 2020-10-27 LAB — COMPLETE METABOLIC PANEL WITH GFR
AG Ratio: 1.5 (calc) (ref 1.0–2.5)
ALT: 12 U/L (ref 9–46)
AST: 15 U/L (ref 10–35)
Albumin: 3.6 g/dL (ref 3.6–5.1)
Alkaline phosphatase (APISO): 86 U/L (ref 35–144)
BUN/Creatinine Ratio: 14 (calc) (ref 6–22)
BUN: 25 mg/dL (ref 7–25)
CO2: 33 mmol/L — ABNORMAL HIGH (ref 20–32)
Calcium: 9.5 mg/dL (ref 8.6–10.3)
Chloride: 98 mmol/L (ref 98–110)
Creat: 1.8 mg/dL — ABNORMAL HIGH (ref 0.70–1.28)
Globulin: 2.4 g/dL (calc) (ref 1.9–3.7)
Glucose, Bld: 137 mg/dL — ABNORMAL HIGH (ref 65–99)
Potassium: 4.2 mmol/L (ref 3.5–5.3)
Sodium: 139 mmol/L (ref 135–146)
Total Bilirubin: 1.2 mg/dL (ref 0.2–1.2)
Total Protein: 6 g/dL — ABNORMAL LOW (ref 6.1–8.1)
eGFR: 38 mL/min/{1.73_m2} — ABNORMAL LOW (ref >=60)

## 2020-10-27 LAB — LIPID PANEL
Cholesterol: 125 mg/dL (ref <200)
HDL: 49 mg/dL (ref >=40)
LDL Cholesterol (Calc): 63 mg/dL (calc)
Non-HDL Cholesterol (Calc): 76 mg/dL (calc) (ref <130)
Total CHOL/HDL Ratio: 2.6 (calc) (ref <5.0)
Triglycerides: 54 mg/dL (ref <150)

## 2020-10-27 LAB — HEMOGLOBIN A1C
Hgb A1c MFr Bld: 6.5 % of total Hgb — ABNORMAL HIGH (ref <5.7)
Mean Plasma Glucose: 140 mg/dL
eAG (mmol/L): 7.7 mmol/L

## 2020-10-29 ENCOUNTER — Ambulatory Visit: Payer: Medicare Other | Admitting: Nurse Practitioner

## 2020-10-29 ENCOUNTER — Other Ambulatory Visit: Payer: Self-pay

## 2020-11-24 ENCOUNTER — Other Ambulatory Visit: Payer: Self-pay

## 2020-11-24 ENCOUNTER — Ambulatory Visit (INDEPENDENT_AMBULATORY_CARE_PROVIDER_SITE_OTHER): Payer: Medicare Other | Admitting: Nurse Practitioner

## 2020-11-24 ENCOUNTER — Encounter: Payer: Self-pay | Admitting: Nurse Practitioner

## 2020-11-24 VITALS — BP 138/76 | HR 107 | Temp 97.7°F | Ht 68.0 in | Wt 238.0 lb

## 2020-11-24 DIAGNOSIS — IMO0002 Reserved for concepts with insufficient information to code with codable children: Secondary | ICD-10-CM

## 2020-11-24 DIAGNOSIS — D649 Anemia, unspecified: Secondary | ICD-10-CM

## 2020-11-24 DIAGNOSIS — I10 Essential (primary) hypertension: Secondary | ICD-10-CM

## 2020-11-24 DIAGNOSIS — R600 Localized edema: Secondary | ICD-10-CM

## 2020-11-24 DIAGNOSIS — E1165 Type 2 diabetes mellitus with hyperglycemia: Secondary | ICD-10-CM

## 2020-11-24 DIAGNOSIS — H6123 Impacted cerumen, bilateral: Secondary | ICD-10-CM

## 2020-11-24 DIAGNOSIS — M109 Gout, unspecified: Secondary | ICD-10-CM | POA: Diagnosis not present

## 2020-11-24 DIAGNOSIS — I509 Heart failure, unspecified: Secondary | ICD-10-CM | POA: Diagnosis not present

## 2020-11-24 DIAGNOSIS — E1129 Type 2 diabetes mellitus with other diabetic kidney complication: Secondary | ICD-10-CM

## 2020-11-24 DIAGNOSIS — E785 Hyperlipidemia, unspecified: Secondary | ICD-10-CM | POA: Diagnosis not present

## 2020-11-24 DIAGNOSIS — Z6836 Body mass index (BMI) 36.0-36.9, adult: Secondary | ICD-10-CM

## 2020-11-24 MED ORDER — FUROSEMIDE 40 MG PO TABS
40.0000 mg | ORAL_TABLET | Freq: Every day | ORAL | 1 refills | Status: DC
Start: 2020-11-24 — End: 2021-06-06

## 2020-11-24 MED ORDER — POTASSIUM CHLORIDE CRYS ER 10 MEQ PO TBCR
10.0000 meq | EXTENDED_RELEASE_TABLET | Freq: Every day | ORAL | 1 refills | Status: DC
Start: 2020-11-24 — End: 2021-02-28

## 2020-11-24 NOTE — Progress Notes (Signed)
Careteam: Patient Care Team: Lauree Chandler, NP as PCP - General (Geriatric Medicine) Sueanne Margarita, MD as PCP - Cardiology (Cardiology)  PLACE OF SERVICE:  [place] Directive information Does Patient Have a Medical Advance Directive?: Yes, Type of Advance Directive: [place];Living will, Does patient want to make changes to medical advance directive?: No - Patient declined  Allergies  Allergen Reactions   Allopurinol Other (See Comments)    Welts in the body and and blisters in the mouth    Chief Complaint  Patient presents with   Medical Management of Chronic Issues    3 month follow-up. Discuss why patient is not on a fluid pill. Patient is not sure if he is taking norvasc.      HPI: Patient is a 80 y.o. male for routine follow up.   DM- last A1c at [facility]. Continues on glipizide 5 mg daily  Htn- continues on Toprol, cardura. He states he is not taking norvasc.   Hyperlipidemia- LDL at goal on crestor 5 mg daily   A fib- controlled on metoprolol and continues on eliquis for anticoagulation.   CHF- has been off lasix for several months, reports he was not able to get refill. Not taking potassium.  Reports worsening shortness of breath. Significant LE edema.  Feels like lungs are filled with water because he can not breath deep. Unable to lay to flat.   Gout- no recent flares.     Review of Systems:  Review of Systems  Constitutional:  Negative for chills, fever and weight loss.  HENT:  Negative for tinnitus.   Respiratory:  Positive for shortness of breath. Negative for cough and sputum production.   Cardiovascular:  Positive for leg swelling. Negative for chest pain and palpitations.  Gastrointestinal:  Negative for abdominal pain, constipation, diarrhea and heartburn.  Genitourinary:  Negative for dysuria, frequency and urgency.  Musculoskeletal:  Negative for back pain, falls, joint pain and myalgias.  Skin: Negative.    Neurological:  Negative for dizziness and headaches.  Psychiatric/Behavioral:  Negative for depression and memory loss. The patient does not have insomnia.    Past Medical History:  Diagnosis Date   Cholelithiasis [id number]   uncomplicated.     Chronic kidney disease, stage II (mild)    Depression    Diabetes mellitus without complication ([place])    Essential hypertension, malignant    Fatty liver 02/2018   on ultrasound   Gout, unspecified    Hyperlipidemia    Lumbago    Obesity, unspecified    Pain in joint, pelvic region and thigh    Pleural effusion on right 02/2018   Past Surgical History:  Procedure Laterality Date   APPENDECTOMY     COLONOSCOPY WITH PROPOFOL N/A 02/22/2018   Procedure: COLONOSCOPY WITH PROPOFOL;  Surgeon: Milus Banister, MD;  Location: [facility] ENDOSCOPY;  Service: Endoscopy;  Laterality: N/A;   CYST EXCISION  1998   back   ESOPHAGOGASTRODUODENOSCOPY (EGD) WITH PROPOFOL N/A 02/22/2018   Procedure: ESOPHAGOGASTRODUODENOSCOPY (EGD) WITH PROPOFOL;  Surgeon: Milus Banister, MD;  Location: [facility] ENDOSCOPY;  Service: Endoscopy;  Laterality: N/A;   POLYPECTOMY  02/22/2018   Procedure: POLYPECTOMY;  Surgeon: Milus Banister, MD;  Location[facility] ENDOSCOPY;  Service: Endoscopy;;   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 His smokeless tobacco use includes chew. He reports current alcohol use of about 1.0 standard  drink of alcohol per week. He reports that he does not use drugs.  Family History  Problem Relation Age of Onset   Heart Problems Father    Heart disease Brother    Diabetes Brother    Diabetes Brother    Heart Problems Mother    Pneumonia Maternal Grandfather     Medications: Patient's Medications  New Prescriptions   No medications on file  Previous Medications   AMLODIPINE (NORVASC) 10 MG TABLET    Take 1 tablet (10 mg total) by mouth daily.   APIXABAN (ELIQUIS) 2.5 MG TABS TABLET     Take one tablet by mouth twice daily.   ASPIRIN 81 MG EC TABLET    Take 1 tablet (81 mg total) by mouth daily.   COLCHICINE 0.6 MG TABLET    2 tablets now and may repeat 1 tablet in an hour.   DOXAZOSIN (CARDURA) 8 MG TABLET    TAKE 1 TABLET BY MOUTH ONCE DAILY   GLIPIZIDE (GLUCOTROL) 5 MG TABLET    Take 1 tablet (5 mg total) by mouth daily before breakfast.   METOPROLOL SUCCINATE (TOPROL-XL) 50 MG 24 HR TABLET    Take 50 mg by mouth daily. Take with or immediately following a meal.   MULTIPLE VITAMINS-MINERALS (CENTRUM SILVER 50+MEN) TABS    Take 1 tablet by mouth daily.   ROSUVASTATIN (CRESTOR) 5 MG TABLET    TAKE 1 TABLET BY MOUTH  DAILY  Modified Medications   No medications on file  Discontinued Medications   CONTINUOUS BLOOD GLUC RECEIVER (FREESTYLE LIBRE 14 DAY READER) DEVI    1 each by Does not apply route as needed.   CONTINUOUS BLOOD GLUC SENSOR (FREESTYLE LIBRE 14 DAY SENSOR) MISC    1 each by Does not apply route as needed.   FUROSEMIDE (LASIX) 40 MG TABLET    Take 1 tablet (40 mg total) by mouth daily.   POTASSIUM CHLORIDE (KLOR-CON) 10 MEQ TABLET    Take 1 tablet (10 mEq total) by mouth daily.    Physical Exam:  Vitals:   11/24/20 1327  BP: 138/76  Pulse: (!) 107  Temp: 97.7 F (36.5 C)  TempSrc: Temporal  SpO2: 92%  Weight: 238 lb (108 kg)  Height: '5\' 8"'$  (1.727 m)   Body mass index is 36.19 kg/m. Wt Readings from Last 3 Encounters:  11/24/20 238 lb (108 kg)  08/06/20 243 lb (110.2 kg)  08/03/20 245 lb 12.8 oz (111.5 kg)    Physical Exam Constitutional:      General: He is not in acute distress.    Appearance: He is well-developed. He is not diaphoretic.  HENT:     Head: Normocephalic and atraumatic.     Right Ear: External ear normal. There is impacted cerumen.     Left Ear: External ear normal. There is impacted cerumen.     Mouth/Throat:     Pharynx: No oropharyngeal exudate.  Eyes:     Conjunctiva/sclera: Conjunctivae normal.     Pupils: Pupils  are equal, round, and reactive to light.  Cardiovascular:     Rate and Rhythm: Normal rate. Rhythm irregular.     Heart sounds: Normal heart sounds.  Pulmonary:     Effort: Pulmonary effort is normal.     Comments: Diminished breath sounds throughout Abdominal:     General: Bowel sounds are normal.     Palpations: Abdomen is soft.  Musculoskeletal:        General: No tenderness.  Cervical back: Normal range of motion and neck supple.     Right lower leg: Edema (2+) present.     Left lower leg: Edema (2+) present.  Skin:    General: Skin is warm and dry.  Neurological:     Mental Status: He is alert and oriented to person, place, and time.    Labs reviewed: Basic Metabolic Panel: Recent Labs    01/21/20 1437 02/09/20 1403 08/02/20 0412 08/02/20 1445 08/03/20 0418 08/06/20 1548 10/26/20 1350  NA 141   < > 137   < > 136 138 139  K 3.7   < > 3.9   < > 4.1 4.6 4.2  CL 100   < > 94*   < > 95* 96* 98  CO2 34*   < > 35*   < > 35* 34* 33*  GLUCOSE 123*   < > 122*   < > 174* 131 137*  BUN 17   < > 30*   < > 33* 26* 25  CREATININE 1.59*   < > 2.13*   < > 1.94* 1.71* 1.80*  CALCIUM 9.8   < > 9.6   < > 9.4 9.9 9.5  MG  --    < > 1.6*  --  2.4 2.0  --   TSH 3.04  --   --   --   --   --   --    < > = values in this interval not displayed.   Liver Function Tests: Recent Labs    02/09/20 1403 07/30/20 0916 10/26/20 1350  AST '14 15 15  '$ ALT '10 14 12  '$ ALKPHOS  --  79  --   BILITOT 1.0 0.9 1.2  PROT 6.7 6.3* 6.0*  ALBUMIN  --  3.3*  --    No results for input(s): LIPASE, AMYLASE in the last 8760 hours. No results for input(s): AMMONIA in the last 8760 hours. CBC: Recent Labs    02/09/20 1403 07/30/20 0916 07/31/20 0202 08/02/20 0412 08/03/20 0418 10/26/20 1350  WBC 8.0 7.8   < > 8.7 7.6 6.8  NEUTROABS 5,688 5.4  --   --   --  4,957  HGB 13.8 13.0   < > 13.3 12.8* 13.4  HCT 42.0 41.5   < > 42.5 41.4 41.4  MCV 89.0 92.0   < > 90.0 91.0 87.3  PLT 206 206   < > 199  200 190   < > = values in this interval not displayed.   Lipid Panel: Recent Labs    01/21/20 1437 02/09/20 1403 10/26/20 1350  CHOL 119 124 125  HDL 32* 46 49  LDLCALC 68 62 63  TRIG 107 82 54  CHOLHDL 3.7 2.7 2.6   TSH: Recent Labs    01/21/20 1437  TSH 3.04   A1C: Lab Results  Component Value Date   HGBA1C 6.5 (H) 10/26/2020     Assessment/Plan 1. Hyperlipidemia LDL goal <70 - -continues on crestor. LDL at goal. Continue dietary modifications with medication management.    2. Congestive heart failure, unspecified HF chronicity, unspecified heart failure type ([place]) -worsening shortness of breath, edema. He has not been taking lasix since he ran out from hospitalization. (Reports phamaracy told him he could not have refill) educated him to cal the office if he had problems with getting refills again.  -to start lasix today. He will need follow up blood work but reports he is going to see cardiologist on the 29th  and will get blood work done then.  -he is aware if symptoms worsening to go to ED or do not improve to follow up in office.  - furosemide (LASIX) 40 MG tablet; Take 1 tablet (40 mg total) by mouth daily.  Dispense: 90 tablet; Refill: 1 - potassium chloride (KLOR-CON) 10 MEQ tablet; Take 1 tablet (10 mEq total) by mouth daily.  Dispense: 90 tablet; Refill: 1 - BASIC METABOLIC PANEL WITH GFR; Future  3. DM type 2, uncontrolled, with renal complications ([place]) -[id number] improved on recent lab. Continue glipizide. No hypoglycemia. Encouraged dietary compliance, routine foot care/monitoring and to keep up with diabetic eye exams through ophthalmology   4. Anemia, unspecified type -stable on last labs.  5. Class 2 severe obesity due to excess calories with serious comorbidity and body mass index (BMI) of 36.0 to 36.9 in adult [facility]) -education provided on healthy weight loss through increase in physical activity and proper nutrition.   6. Bilateral leg edema Worsening  bilateral LE. Lasix restarted today. Recommend elevation and low sodium diet.   7. Essential hypertension -stable. Goal bp <140/90. Continue on current regimen (he is not sure that he is taking amlodipine- will call and clarify) with low sodium diet.   8. Gout, unspecified cause, unspecified chronicity, unspecified site No recent flares  9. Bilateral impacted cerumen -removed with flush and grabber. Pt tolerated well.   Next appt: 3 months.  Carlos American. [place], [facility] [phone]

## 2020-11-24 NOTE — Patient Instructions (Addendum)
Please check medication at home and make sure you are not taking norvasc/amlodipine -- if you are taking call us to let us know.   If the pharmacy does not refill medications that you should be taking please call the office to get a refill.   Restart lasix and potassium at this time. Rx has been sent to the drug store.  Please keep appt with cardiologist and have repeat blood work done at their office.  If swelling and shortness of breath do not improve follow up in office in 1 week If symptoms worsen follow up sooner or go to ED.

## 2020-11-26 ENCOUNTER — Telehealth: Payer: Self-pay

## 2020-11-26 NOTE — Telephone Encounter (Signed)
Spoke with patient, discussed response with patient. Patient verbalized understanding.   Medication list updated

## 2020-11-26 NOTE — Telephone Encounter (Signed)
No just wanted to verify if he was taking or not. Please remove from medication list and we will continue to monitor his blood pressure in office. Hopefully swelling and shortness of breath has improved with lasix

## 2020-11-26 NOTE — Telephone Encounter (Signed)
Incoming call received from patient stating he was told to check all his pill bottles and compare them to the last medication list provided to him on 11/24/2020. Patient states he has checked an dhe can not find a pill bottle for Norvasc anywhere and if Janett Billow wants him to take he needs a new rx   Please advise

## 2020-12-13 ENCOUNTER — Ambulatory Visit: Payer: Medicare Other | Admitting: Cardiology

## 2020-12-16 ENCOUNTER — Other Ambulatory Visit: Payer: Self-pay | Admitting: Nurse Practitioner

## 2020-12-16 DIAGNOSIS — I10 Essential (primary) hypertension: Secondary | ICD-10-CM

## 2021-01-21 ENCOUNTER — Telehealth: Payer: Self-pay

## 2021-01-21 NOTE — Telephone Encounter (Signed)
Tried calling patient to schedule AWV,but he did not want to schedule

## 2021-01-28 ENCOUNTER — Other Ambulatory Visit: Payer: Self-pay | Admitting: Nurse Practitioner

## 2021-01-28 DIAGNOSIS — I10 Essential (primary) hypertension: Secondary | ICD-10-CM

## 2021-02-28 ENCOUNTER — Other Ambulatory Visit: Payer: Self-pay | Admitting: Nurse Practitioner

## 2021-02-28 ENCOUNTER — Telehealth: Payer: Self-pay | Admitting: Nurse Practitioner

## 2021-02-28 DIAGNOSIS — I10 Essential (primary) hypertension: Secondary | ICD-10-CM

## 2021-02-28 DIAGNOSIS — I509 Heart failure, unspecified: Secondary | ICD-10-CM

## 2021-02-28 DIAGNOSIS — N1832 Chronic kidney disease, stage 3b: Secondary | ICD-10-CM

## 2021-02-28 DIAGNOSIS — D649 Anemia, unspecified: Secondary | ICD-10-CM

## 2021-02-28 DIAGNOSIS — E1122 Type 2 diabetes mellitus with diabetic chronic kidney disease: Secondary | ICD-10-CM

## 2021-02-28 DIAGNOSIS — E785 Hyperlipidemia, unspecified: Secondary | ICD-10-CM

## 2021-02-28 NOTE — Telephone Encounter (Signed)
If he would like can place labs prior to visit.

## 2021-02-28 NOTE — Telephone Encounter (Signed)
MR Aiello called to verify his appt for Friday & wants to know if he's supposed to have labs this week before appt?  Please advise & when returning his call, please leave a message if he doesn't answer per his request Thanks, Vilinda Blanks

## 2021-02-28 NOTE — Telephone Encounter (Signed)
Orders placed.

## 2021-02-28 NOTE — Addendum Note (Signed)
Addended by: Lauree Chandler on: 02/28/2021 10:49 AM   Modules accepted: Orders

## 2021-03-01 ENCOUNTER — Other Ambulatory Visit: Payer: Medicare Other

## 2021-03-01 DIAGNOSIS — E785 Hyperlipidemia, unspecified: Secondary | ICD-10-CM

## 2021-03-01 DIAGNOSIS — I10 Essential (primary) hypertension: Secondary | ICD-10-CM

## 2021-03-01 DIAGNOSIS — D649 Anemia, unspecified: Secondary | ICD-10-CM

## 2021-03-01 DIAGNOSIS — N1832 Chronic kidney disease, stage 3b: Secondary | ICD-10-CM

## 2021-03-03 ENCOUNTER — Ambulatory Visit (INDEPENDENT_AMBULATORY_CARE_PROVIDER_SITE_OTHER): Payer: Medicare Other

## 2021-03-03 ENCOUNTER — Other Ambulatory Visit: Payer: Medicare Other

## 2021-03-03 ENCOUNTER — Telehealth: Payer: Self-pay

## 2021-03-03 ENCOUNTER — Other Ambulatory Visit: Payer: Self-pay

## 2021-03-03 DIAGNOSIS — D649 Anemia, unspecified: Secondary | ICD-10-CM | POA: Diagnosis not present

## 2021-03-03 DIAGNOSIS — Z23 Encounter for immunization: Secondary | ICD-10-CM | POA: Diagnosis not present

## 2021-03-03 DIAGNOSIS — E785 Hyperlipidemia, unspecified: Secondary | ICD-10-CM | POA: Diagnosis not present

## 2021-03-03 DIAGNOSIS — N1832 Chronic kidney disease, stage 3b: Secondary | ICD-10-CM | POA: Diagnosis not present

## 2021-03-03 DIAGNOSIS — E1165 Type 2 diabetes mellitus with hyperglycemia: Secondary | ICD-10-CM | POA: Diagnosis not present

## 2021-03-03 DIAGNOSIS — I10 Essential (primary) hypertension: Secondary | ICD-10-CM | POA: Diagnosis not present

## 2021-03-03 NOTE — Telephone Encounter (Signed)
Patient stopped by the office during the 1 pm hour and picked up samples

## 2021-03-03 NOTE — Telephone Encounter (Signed)
Message left on clinical intake voicemail:   Patient is out of blood thinner and needs someone to call [facility] to find out what is going on, "It says medication was shipped but I have not received it, so I do not know if it was denied or what."  Left message on voicemail for patient to return call when available  Called placed to [facility] to check status on patients behalf. Per the pharmacist patient had not requested a refill. RX last filled in May 2022. The pharmacist will refill and put a rush on it, rx should be received by patient in 3-5 days.   I called patient again to see if I could reach him. We have samples of medication and I placed them at the front desk for pick-up. Patient informed in message and aware to stop by and pick up at his convenience.

## 2021-03-04 ENCOUNTER — Ambulatory Visit (INDEPENDENT_AMBULATORY_CARE_PROVIDER_SITE_OTHER): Payer: Medicare Other | Admitting: Nurse Practitioner

## 2021-03-04 ENCOUNTER — Encounter: Payer: Self-pay | Admitting: Nurse Practitioner

## 2021-03-04 VITALS — BP 138/84 | HR 103 | Temp 97.8°F | Ht 68.0 in | Wt 228.6 lb

## 2021-03-04 DIAGNOSIS — E785 Hyperlipidemia, unspecified: Secondary | ICD-10-CM | POA: Diagnosis not present

## 2021-03-04 DIAGNOSIS — I509 Heart failure, unspecified: Secondary | ICD-10-CM

## 2021-03-04 DIAGNOSIS — N1832 Chronic kidney disease, stage 3b: Secondary | ICD-10-CM

## 2021-03-04 DIAGNOSIS — D649 Anemia, unspecified: Secondary | ICD-10-CM

## 2021-03-04 DIAGNOSIS — I10 Essential (primary) hypertension: Secondary | ICD-10-CM

## 2021-03-04 DIAGNOSIS — E1122 Type 2 diabetes mellitus with diabetic chronic kidney disease: Secondary | ICD-10-CM | POA: Diagnosis not present

## 2021-03-04 LAB — COMPLETE METABOLIC PANEL WITH GFR
AG Ratio: 1.4 (calc) (ref 1.0–2.5)
ALT: 13 U/L (ref 9–46)
AST: 13 U/L (ref 10–35)
Albumin: 4 g/dL (ref 3.6–5.1)
Alkaline phosphatase (APISO): 84 U/L (ref 35–144)
BUN/Creatinine Ratio: 23 (calc) — ABNORMAL HIGH (ref 6–22)
BUN: 43 mg/dL — ABNORMAL HIGH (ref 7–25)
CO2: 35 mmol/L — ABNORMAL HIGH (ref 20–32)
Calcium: 10.3 mg/dL (ref 8.6–10.3)
Chloride: 97 mmol/L — ABNORMAL LOW (ref 98–110)
Creat: 1.91 mg/dL — ABNORMAL HIGH (ref 0.70–1.28)
Globulin: 2.8 g/dL (calc) (ref 1.9–3.7)
Glucose, Bld: 196 mg/dL — ABNORMAL HIGH (ref 65–99)
Potassium: 3.8 mmol/L (ref 3.5–5.3)
Sodium: 141 mmol/L (ref 135–146)
Total Bilirubin: 1 mg/dL (ref 0.2–1.2)
Total Protein: 6.8 g/dL (ref 6.1–8.1)
eGFR: 35 mL/min/{1.73_m2} — ABNORMAL LOW (ref >=60)

## 2021-03-04 LAB — CBC WITH DIFFERENTIAL/PLATELET
Absolute Monocytes: 724 cells/uL (ref 200–950)
Basophils Absolute: 39 cells/uL (ref 0–200)
Basophils Relative: 0.5 %
Eosinophils Absolute: 285 cells/uL (ref 15–500)
Eosinophils Relative: 3.7 %
HCT: 46 % (ref 38.5–50.0)
Hemoglobin: 15 g/dL (ref 13.2–17.1)
Lymphs Abs: 1617 cells/uL (ref 850–3900)
MCH: 28.8 pg (ref 27.0–33.0)
MCHC: 32.6 g/dL (ref 32.0–36.0)
MCV: 88.5 fL (ref 80.0–100.0)
MPV: 9.9 fL (ref 7.5–12.5)
Monocytes Relative: 9.4 %
Neutro Abs: 5036 cells/uL (ref 1500–7800)
Neutrophils Relative %: 65.4 %
Platelets: 190 10*3/uL (ref 140–400)
RBC: 5.2 10*6/uL (ref 4.20–5.80)
RDW: 14.7 % (ref 11.0–15.0)
Total Lymphocyte: 21 %
WBC: 7.7 10*3/uL (ref 3.8–10.8)

## 2021-03-04 LAB — LIPID PANEL
Cholesterol: 147 mg/dL (ref <200)
HDL: 51 mg/dL (ref >=40)
LDL Cholesterol (Calc): 74 mg/dL (calc)
Non-HDL Cholesterol (Calc): 96 mg/dL (calc) (ref <130)
Total CHOL/HDL Ratio: 2.9 (calc) (ref <5.0)
Triglycerides: 135 mg/dL (ref <150)

## 2021-03-04 LAB — HEMOGLOBIN A1C
Hgb A1c MFr Bld: 8.8 % of total Hgb — ABNORMAL HIGH (ref <5.7)
Mean Plasma Glucose: 206 mg/dL
eAG (mmol/L): 11.4 mmol/L

## 2021-03-04 MED ORDER — LINAGLIPTIN 5 MG PO TABS: 5.0000 mg | ORAL_TABLET | Freq: Every day | ORAL | 1 refills | Status: DC

## 2021-03-04 NOTE — Patient Instructions (Signed)
Start tradjenta 5 mg by mouth daily at breakfast.

## 2021-03-04 NOTE — Progress Notes (Signed)
Careteam: Patient Care Team: Lauree Chandler, NP as PCP - General (Geriatric Medicine) Sueanne Margarita, MD as PCP - Cardiology (Cardiology)  PLACE OF SERVICE:  [place] Directive information    Allergies  Allergen Reactions   Allopurinol Other (See Comments)    Welts in the body and and blisters in the mouth    Chief Complaint  Patient presents with   Medical Management of Chronic Issues    3 month follow-up and discuss labs. Discuss need for eye exam, shingrix, and pneumonia vaccine or post-pone/exclude if patient refuses. Foot exam today.      HPI: Patient is a 80 y.o. male for routine follow up  Reports he feels well and is doing well.   DM- not taking glipizide A1c 8.8. was choking him when he took medication   CHF- no swelling, no chest pains or shortness of breath. Does not wish to follow up with cardiologist in regards to heart failure.   Htn- at goal today  A fib- bruising easily but nothing excessive. Continues on eliquis for anticoagulation   Does not sleep well in general.    Review of Systems:  Review of Systems  Constitutional:  Negative for chills, fever and weight loss.  HENT:  Negative for tinnitus.   Respiratory:  Negative for cough, sputum production and shortness of breath.   Cardiovascular:  Negative for chest pain, palpitations and leg swelling.  Gastrointestinal:  Negative for abdominal pain, constipation, diarrhea and heartburn.  Genitourinary:  Negative for dysuria, frequency and urgency.  Musculoskeletal:  Negative for back pain, falls, joint pain and myalgias.  Skin: Negative.   Neurological:  Negative for dizziness and headaches.  Psychiatric/Behavioral:  Negative for depression and memory loss. The patient does not have insomnia.    Past Medical History:  Diagnosis Date   Cholelithiasis 06/4740   uncomplicated.     Chronic kidney disease, stage II (mild)    Depression    Diabetes mellitus without complication  ([place])    Essential hypertension, malignant    Fatty liver 02/2018   on ultrasound   Gout, unspecified    Hyperlipidemia    Lumbago    Obesity, unspecified    Pain in joint, pelvic region and thigh    Pleural effusion on right 02/2018   Past Surgical History:  Procedure Laterality Date   APPENDECTOMY     COLONOSCOPY WITH PROPOFOL N/A 02/22/2018   Procedure: COLONOSCOPY WITH PROPOFOL;  Surgeon: Milus Banister, MD;  Location[facility] ENDOSCOPY;  Service: Endoscopy;  Laterality: N/A;   CYST EXCISION  1998   back   ESOPHAGOGASTRODUODENOSCOPY (EGD) WITH PROPOFOL N/A 02/22/2018   Procedure: ESOPHAGOGASTRODUODENOSCOPY (EGD) WITH PROPOFOL;  Surgeon: Milus Banister, MD;  Location[facility] ENDOSCOPY;  Service: Endoscopy;  Laterality: N/A;   POLYPECTOMY  02/22/2018   Procedure: POLYPECTOMY;  Surgeon: Milus Banister, MD;  Location: [facility] ENDOSCOPY;  Service: Endoscopy;;   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 His smokeless tobacco use includes chew. He reports current alcohol use of about 1.0 standard drink per week. He reports that he does not use drugs.  Family History  Problem Relation Age of Onset   Heart Problems Father    Heart disease Brother    Diabetes Brother    Diabetes Brother    Heart Problems Mother    Pneumonia Maternal Grandfather     Medications: Patient's Medications  New Prescriptions   No medications on file  Previous Medications   APIXABAN (ELIQUIS) 2.5 MG TABS TABLET    Take one tablet by mouth twice daily.   ASPIRIN 81 MG EC TABLET    Take 1 tablet (81 mg total) by mouth daily.   COLCHICINE 0.6 MG TABLET    2 tablets now and may repeat 1 tablet in an hour.   DOXAZOSIN (CARDURA) 8 MG TABLET    TAKE 1 TABLET BY MOUTH ONCE DAILY   FUROSEMIDE (LASIX) 40 MG TABLET    Take 1 tablet (40 mg total) by mouth daily.   GLIPIZIDE (GLUCOTROL) 5 MG TABLET    Take 1 tablet (5 mg total) by mouth daily before  breakfast.   METOPROLOL SUCCINATE (TOPROL-XL) 50 MG 24 HR TABLET    TAKE 1 TABLET BY MOUTH ONCE DAILY WITH OR IMMEDIATELY  FOLLOWING A MEAL   MULTIPLE VITAMINS-MINERALS (CENTRUM SILVER 50+MEN) TABS    Take 1 tablet by mouth daily.   POTASSIUM CHLORIDE (KLOR-CON) 10 MEQ TABLET    TAKE 1 TABLET(10 MEQ) BY MOUTH DAILY   ROSUVASTATIN (CRESTOR) 5 MG TABLET    TAKE 1 TABLET BY MOUTH  DAILY  Modified Medications   No medications on file  Discontinued Medications   No medications on file    Physical Exam:  Vitals:   03/04/21 1258  BP: 138/84  Pulse: (!) 103  Temp: 97.8 F (36.6 C)  SpO2: 95%  Weight: 228 lb 9.6 oz (103.7 kg)  Height: 5\' 8"  (1.727 m)   Body mass index is 34.76 kg/m. Wt Readings from Last 3 Encounters:  03/04/21 228 lb 9.6 oz (103.7 kg)  11/24/20 238 lb (108 kg)  08/06/20 243 lb (110.2 kg)    Physical Exam Constitutional:      General: He is not in acute distress.    Appearance: He is well-developed. He is not diaphoretic.  HENT:     Head: Normocephalic and atraumatic.     Right Ear: External ear normal.     Left Ear: External ear normal.     Mouth/Throat:     Pharynx: No oropharyngeal exudate.  Eyes:     Conjunctiva/sclera: Conjunctivae normal.     Pupils: Pupils are equal, round, and reactive to light.  Cardiovascular:     Rate and Rhythm: Normal rate and regular rhythm.     Heart sounds: Normal heart sounds.  Pulmonary:     Effort: Pulmonary effort is normal.     Breath sounds: Normal breath sounds.  Abdominal:     General: Bowel sounds are normal.     Palpations: Abdomen is soft.  Musculoskeletal:        General: No tenderness.     Cervical back: Normal range of motion and neck supple.     Right lower leg: No edema.     Left lower leg: No edema.  Skin:    General: Skin is warm and dry.  Neurological:     Mental Status: He is alert and oriented to person, place, and time.  Psychiatric:        Mood and Affect: Mood normal.    Labs  reviewed: Basic Metabolic Panel: Recent Labs    08/02/20 0412 08/02/20 1445 08/03/20 0418 08/06/20 1548 10/26/20 1350 03/03/21 1309  NA 137   < > 136 138 139 141  K 3.9   < > 4.1 4.6 4.2 3.8  CL 94*   < > 95* 96* 98 97*  CO2 35*   < >  35* 34* 33* 35*  GLUCOSE 122*   < > 174* 131 137* 196*  BUN 30*   < > 33* 26* 25 43*  CREATININE 2.13*   < > 1.94* 1.71* 1.80* 1.91*  CALCIUM 9.6   < > 9.4 9.9 9.5 10.3  MG 1.6*  --  2.4 2.0  --   --    < > = values in this interval not displayed.   Liver Function Tests: Recent Labs    07/30/20 0916 10/26/20 1350 03/03/21 1309  AST 15 15 13   ALT 14 12 13   ALKPHOS 79  --   --   BILITOT 0.9 1.2 1.0  PROT 6.3* 6.0* 6.8  ALBUMIN 3.3*  --   --    No results for input(s): LIPASE, AMYLASE in the last 8760 hours. No results for input(s): AMMONIA in the last 8760 hours. CBC: Recent Labs    07/30/20 0916 07/31/20 0202 08/03/20 0418 10/26/20 1350 03/03/21 1309  WBC 7.8   < > 7.6 6.8 7.7  NEUTROABS 5.4  --   --  4,957 5,036  HGB 13.0   < > 12.8* 13.4 15.0  HCT 41.5   < > 41.4 41.4 46.0  MCV 92.0   < > 91.0 87.3 88.5  PLT 206   < > 200 190 190   < > = values in this interval not displayed.   Lipid Panel: Recent Labs    10/26/20 1350 03/03/21 1309  CHOL 125 147  HDL 49 51  LDLCALC 63 74  TRIG 54 135  CHOLHDL 2.6 2.9   TSH: No results for input(s): TSH in the last 8760 hours. A1C: Lab Results  Component Value Date   HGBA1C 8.8 (H) 03/03/2021     Assessment/Plan 1. Hyperlipidemia LDL goal <70 -stable, stressed importance of LDL <70, continues on crestor 5 mg daily. Continue dietary modification.   2. Anemia, unspecified type Cbc stable.   3. Stage 3b chronic kidney disease ([place]) -it has been recommended that he follow up with nephrologist in the past but he has declined.  Currently stable.  Encourage proper hydration Follow metabolic panel Avoid nephrotoxic meds (NSAIDS)  4. Essential hypertension -at goal today,  reports higher in office always. Continue current regimen   5. Type 2 diabetes mellitus with stage 3b chronic kidney disease, without long-term current use of insulin ([facility]) -not controlled. He is not taking glipizide due to size of medication.  -Encouraged dietary compliance, routine foot care/monitoring and to keep up with diabetic eye exams through ophthalmology  -willing to start tradjenta at this time -will follow up a1c in 3 months.  - linagliptin (TRADJENTA) 5 MG TABS tablet; Take 1 tablet (5 mg total) by mouth daily.  Dispense: 90 tablet; Refill: 1  6. Congestive heart failure, unspecified HF chronicity, unspecified heart failure type ([place]) Stable at this time, no LE edema, shortness of breath, chest pains. Continues on metoprolol 50 mg daily with lasix 40 mg daily and potassium supplement.    Next appt: 3 months, labs prior  Natahlia Hoggard K. [place], [facility] [phone]

## 2021-03-14 ENCOUNTER — Other Ambulatory Visit: Payer: Self-pay | Admitting: Nurse Practitioner

## 2021-03-14 DIAGNOSIS — I10 Essential (primary) hypertension: Secondary | ICD-10-CM

## 2021-03-17 ENCOUNTER — Other Ambulatory Visit: Payer: Self-pay | Admitting: Nurse Practitioner

## 2021-03-17 DIAGNOSIS — I10 Essential (primary) hypertension: Secondary | ICD-10-CM

## 2021-03-17 NOTE — Telephone Encounter (Signed)
Patient called and stated that he needed a refill on Olmesartan HCTZ 25/12.5  Medication is NOT in patients current medication list. Patient stated that he has been taking this and it works and he needs a refill.   Pharmacy Pended Rx. Is this ok to add to current medication list.  Sent to [facility] for approval.

## 2021-03-22 ENCOUNTER — Other Ambulatory Visit: Payer: Self-pay | Admitting: Nurse Practitioner

## 2021-03-22 DIAGNOSIS — E7800 Pure hypercholesterolemia, unspecified: Secondary | ICD-10-CM

## 2021-03-23 NOTE — Telephone Encounter (Signed)
High warning came up when trying to refill medication. Medication pended and sent to Jessica Eubanks, NP for approval. 

## 2021-03-28 ENCOUNTER — Telehealth: Payer: Self-pay

## 2021-03-28 MED ORDER — COLCHICINE 0.6 MG PO TABS: ORAL_TABLET | ORAL | 0 refills | Status: DC

## 2021-03-28 NOTE — Telephone Encounter (Signed)
Patient called requesting a refill on his colchicine 0.6 MG tablet and request was send into pharmacy.

## 2021-03-29 ENCOUNTER — Other Ambulatory Visit: Payer: Self-pay

## 2021-03-29 ENCOUNTER — Telehealth: Payer: Self-pay

## 2021-03-29 ENCOUNTER — Encounter: Payer: Medicare Other | Admitting: Nurse Practitioner

## 2021-03-29 NOTE — Telephone Encounter (Signed)
I made 3 unsuccessful attempts to reach patient to start AWV. I left a voicemail message with each attempt. Patient was instructed on 3rd and final attempt to call office to reschedule.   1st attempt 9:02 am-left vm 2nd attempt-9:11 am-left vm 3rd and final attempt-9:27 am-left vm

## 2021-03-29 NOTE — Progress Notes (Deleted)
This service is provided via telemedicine  No vital signs collected/recorded due to the encounter was a telemedicine visit.   Location of patient (ex: home, work):  ***  Patient consents to a telephone visit:  ***  Location of the provider (ex: office, home):  ***  Name of any referring provider:  ***  Names of all persons participating in the telemedicine service and their role in the encounter:  ***  Time spent on call:  ***  This encounter was created in error - please disregard.

## 2021-04-01 ENCOUNTER — Other Ambulatory Visit: Payer: Self-pay

## 2021-04-13 ENCOUNTER — Telehealth: Payer: Self-pay

## 2021-04-13 ENCOUNTER — Other Ambulatory Visit: Payer: Self-pay

## 2021-04-13 DIAGNOSIS — N1832 Chronic kidney disease, stage 3b: Secondary | ICD-10-CM

## 2021-04-13 MED ORDER — LINAGLIPTIN 5 MG PO TABS: 5.0000 mg | ORAL_TABLET | Freq: Every day | ORAL | 0 refills | Status: DC

## 2021-04-13 NOTE — Telephone Encounter (Signed)
Incoming call received form patient stating he never received diabetic medication sent in November 2022 from [place].  Patient asked that I send a short term supply to [facility].  RX sent to [facility] as requested.  I called [facility] to inquire if rx for tradjenta was ever received on 03/04/2021. Per representative, rx was received and no explanation could be provided as to why it was never sent to the patient. The representative was apologetic and plans to expedite rx with a expected delivery date of 5 days from today.   I returned call to patient and he verbalized understanding of conversation I had with [facility] representative. Douglas Soto then explained that his feet are hurting him so bad he is unable to ambulate. I offered patient an appointment and he stated there is no way he can come in as his feet are in so much pain x 2 weeks. Patient states, he thinks the pain is related to not taken any diabetic medication.   I told patient he needs to have a family member or someone assist him to an appointment to be evaluated.  I also informed patient he could call 911 to receive emergency assistant. Patient states he will see what he can do and call us back if he is able to schedule and come in for an appointment.

## 2021-04-14 NOTE — Telephone Encounter (Signed)
Noted, agree with recommendations.  Thank you

## 2021-04-15 ENCOUNTER — Telehealth: Payer: Self-pay

## 2021-04-15 DIAGNOSIS — E1122 Type 2 diabetes mellitus with diabetic chronic kidney disease: Secondary | ICD-10-CM

## 2021-04-15 DIAGNOSIS — N1832 Chronic kidney disease, stage 3b: Secondary | ICD-10-CM

## 2021-04-15 NOTE — Telephone Encounter (Signed)
Patient called and would like a different medication send into his pharmacy for his diabetes.He didn't know the name of the medication but know it was send into [facility].  He reports that medication (Tradjenta ?) Sherrie Mustache send is going to cost him $400 and he can not afford that medication.

## 2021-04-19 MED ORDER — SITAGLIPTIN PHOSPHATE 25 MG PO TABS: 25.0000 mg | ORAL_TABLET | Freq: Every day | ORAL | 0 refills | Status: DC

## 2021-04-19 NOTE — Telephone Encounter (Signed)
Let have him try [place]. (Stop tradjenta) Sent to mail order pharmacy at this time.  He needs to set up appt for follow up in office with a1c to be drawn prior to appt in 3 months.

## 2021-04-20 NOTE — Telephone Encounter (Signed)
Patient notified and agreed.  Patient stated that he will call back to schedule an appointment to follow up.

## 2021-04-28 ENCOUNTER — Other Ambulatory Visit: Payer: Medicare Other

## 2021-04-29 ENCOUNTER — Other Ambulatory Visit: Payer: Medicare Other

## 2021-05-02 ENCOUNTER — Other Ambulatory Visit: Payer: Medicare Other

## 2021-05-02 ENCOUNTER — Other Ambulatory Visit: Payer: Self-pay

## 2021-05-02 DIAGNOSIS — E1122 Type 2 diabetes mellitus with diabetic chronic kidney disease: Secondary | ICD-10-CM | POA: Diagnosis not present

## 2021-05-02 DIAGNOSIS — E785 Hyperlipidemia, unspecified: Secondary | ICD-10-CM | POA: Diagnosis not present

## 2021-05-02 DIAGNOSIS — N1832 Chronic kidney disease, stage 3b: Secondary | ICD-10-CM | POA: Diagnosis not present

## 2021-05-02 DIAGNOSIS — I509 Heart failure, unspecified: Secondary | ICD-10-CM | POA: Diagnosis not present

## 2021-05-03 LAB — COMPLETE METABOLIC PANEL WITH GFR
AG Ratio: 1.3 (calc) (ref 1.0–2.5)
ALT: 18 U/L (ref 9–46)
AST: 13 U/L (ref 10–35)
Albumin: 3.6 g/dL (ref 3.6–5.1)
Alkaline phosphatase (APISO): 81 U/L (ref 35–144)
BUN/Creatinine Ratio: 18 (calc) (ref 6–22)
BUN: 29 mg/dL — ABNORMAL HIGH (ref 7–25)
CO2: 36 mmol/L — ABNORMAL HIGH (ref 20–32)
Calcium: 10.3 mg/dL (ref 8.6–10.3)
Chloride: 98 mmol/L (ref 98–110)
Creat: 1.62 mg/dL — ABNORMAL HIGH (ref 0.70–1.28)
Globulin: 2.7 g/dL (calc) (ref 1.9–3.7)
Glucose, Bld: 240 mg/dL — ABNORMAL HIGH (ref 65–139)
Potassium: 4.4 mmol/L (ref 3.5–5.3)
Sodium: 140 mmol/L (ref 135–146)
Total Bilirubin: 0.6 mg/dL (ref 0.2–1.2)
Total Protein: 6.3 g/dL (ref 6.1–8.1)
eGFR: 43 mL/min/{1.73_m2} — ABNORMAL LOW (ref >=60)

## 2021-05-03 LAB — HEMOGLOBIN A1C
Hgb A1c MFr Bld: 8.2 % of total Hgb — ABNORMAL HIGH (ref <5.7)
Mean Plasma Glucose: 189 mg/dL
eAG (mmol/L): 10.4 mmol/L

## 2021-05-04 ENCOUNTER — Other Ambulatory Visit: Payer: Self-pay | Admitting: Nurse Practitioner

## 2021-05-04 DIAGNOSIS — I4821 Permanent atrial fibrillation: Secondary | ICD-10-CM

## 2021-05-04 NOTE — Telephone Encounter (Signed)
Patient has request refill on medication Eliquis. Patient last refill dated 08/24/2020. Medication has High Risk Warnings. Medication pend and sent to Marlowe Sax, NP.

## 2021-05-30 ENCOUNTER — Ambulatory Visit: Payer: Medicare Other | Admitting: Nurse Practitioner

## 2021-05-31 ENCOUNTER — Other Ambulatory Visit: Payer: Self-pay | Admitting: Nurse Practitioner

## 2021-05-31 DIAGNOSIS — N1832 Chronic kidney disease, stage 3b: Secondary | ICD-10-CM

## 2021-05-31 DIAGNOSIS — E1122 Type 2 diabetes mellitus with diabetic chronic kidney disease: Secondary | ICD-10-CM

## 2021-06-06 ENCOUNTER — Other Ambulatory Visit: Payer: Self-pay | Admitting: Nurse Practitioner

## 2021-06-06 DIAGNOSIS — I509 Heart failure, unspecified: Secondary | ICD-10-CM

## 2021-06-17 ENCOUNTER — Ambulatory Visit: Payer: Medicare Other | Admitting: Nurse Practitioner

## 2021-06-20 ENCOUNTER — Other Ambulatory Visit: Payer: Self-pay | Admitting: Nurse Practitioner

## 2021-06-20 DIAGNOSIS — N1832 Chronic kidney disease, stage 3b: Secondary | ICD-10-CM

## 2021-06-27 ENCOUNTER — Encounter: Payer: Self-pay | Admitting: Gastroenterology

## 2021-07-14 NOTE — Progress Notes (Signed)
? ? ?Careteam: ?Patient Care Team: ?Lauree Chandler, NP as PCP - General (Geriatric Medicine) ?Sueanne Margarita, MD as PCP - Cardiology (Cardiology) ? ?PLACE OF SERVICE:  ?[facility]  ?Advanced Directive information ?Does Patient Have a Medical Advance Directive?: Yes, Type of Advance Directive: [place];Living will, Does patient want to make changes to medical advance directive?: No - Patient declined ? ?Allergies  ?Allergen Reactions  ? Allopurinol Other (See Comments)  ?  Welts in the body and and blisters in the mouth  ? ? ?Chief Complaint  ?Patient presents with  ? Medical Management of Chronic Issues  ?  4 month follow-up. Discuss need for eye exam, shingrix, and pneumonia vaccine or post pone/exclude if patient refuses. Patient denies receiving any vaccines since last visit. [facility] verified.   ? ? ? ?HPI: Patient is a 81 y.o. male for follow up.  ? ?HTN- elevated today, taking metprolol, cardura, lasix, olmesartan-amlodipine-hctz.  ?Reports blood pressure at home is 130/70s.  ? ?CHF_ continues on lasix with potassium supplement and metoprolol.  ? ?DM- reports he has read the side effects and "that is commenting suicide"  on Januvia.  ?And did not take.  ?No numbness and tingling in legs.  ?Not having any visual changes. Never been to an eye doctor.  ?"Not going to go to the eye doctor" ? 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  ? ?Review of Systems:  ?Review of Systems  ?Constitutional:  Negative for chills, fever and weight loss.  ?HENT:  Negative for hearing loss and tinnitus.   ?Respiratory:  Positive for shortness of breath (with activity). Negative for cough and sputum production.   ?Cardiovascular:  Negative for chest pain, palpitations and leg swelling.  ?Gastrointestinal:  Negative for abdominal pain, constipation, diarrhea and heartburn.  ?Genitourinary:  Negative for dysuria, frequency and urgency.  ?Musculoskeletal:  Negative for back pain, falls,  joint pain and myalgias.  ?Skin: Negative.   ?Neurological:  Negative for dizziness, weakness and headaches.  ?Psychiatric/Behavioral:  Negative for depression and memory loss. The patient does not have insomnia.   ? ?Past Medical History:  ?Diagnosis Date  ? Cholelithiasis 84/1324  ? uncomplicated.    ? Chronic kidney disease, stage II (mild)   ? Depression   ? Diabetes mellitus without complication ([place])   ? Essential hypertension, malignant   ? Fatty liver 02/2018  ? on ultrasound  ? Gout, unspecified   ? Hyperlipidemia   ? Lumbago   ? Obesity, unspecified   ? Pain in joint, pelvic region and thigh   ? Pleural effusion on right 02/2018  ? ?Past Surgical History:  ?Procedure Laterality Date  ? APPENDECTOMY    ? COLONOSCOPY WITH PROPOFOL N/A 02/22/2018  ? Procedure: COLONOSCOPY WITH PROPOFOL;  Surgeon: Milus Banister, MD;  Location[facility] ENDOSCOPY;  Service: Endoscopy;  Laterality: N/A;  ? CYST EXCISION  1998  ? back  ? ESOPHAGOGASTRODUODENOSCOPY (EGD) WITH PROPOFOL N/A 02/22/2018  ? Procedure: ESOPHAGOGASTRODUODENOSCOPY (EGD) WITH PROPOFOL;  Surgeon: Milus Banister, MD;  Location: [facility] ENDOSCOPY;  Service: Endoscopy;  Laterality: N/A;  ? POLYPECTOMY  02/22/2018  ? Procedure: POLYPECTOMY;  Surgeon: Milus Banister, MD;  Location: [facility] ENDOSCOPY;  Service: Endoscopy;;  ? TOTAL NEPHRECTOMY Right 2011  ? 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 ?Social History: ?  reports that he has quit smoking. His smokeless tobacco use includes chew. He reports current alcohol use of about 1.0 standard drink per week. He  reports that he does not use drugs. ? ?Family History  ?Problem Relation Age of Onset  ? Heart Problems Father   ? Heart disease Brother   ? Diabetes Brother   ? Diabetes Brother   ? Heart Problems Mother   ? Pneumonia Maternal Grandfather   ? ? ?Medications: ?Patient's Medications  ?New Prescriptions  ? No medications on file  ?Previous Medications  ? ASPIRIN 81 MG EC TABLET    Take 1 tablet (81 mg total) by mouth daily.  ?  COLCHICINE 0.6 MG TABLET    2 tablets now and may repeat 1 tablet in an hour.  ? DOXAZOSIN (CARDURA) 8 MG TABLET    TAKE 1 TABLET BY MOUTH ONCE DAILY  ? ELIQUIS 2.5 MG TABS TABLET    TAKE 1 TABLET BY MOUTH  TWICE DAILY  ? FUROSEMIDE (LASIX) 40 MG TABLET    TAKE 1 TABLET(40 MG) BY MOUTH DAILY  ? JANUVIA 25 MG TABLET    TAKE 1 TABLET BY MOUTH DAILY  ? METOPROLOL SUCCINATE (TOPROL-XL) 50 MG 24 HR TABLET    TAKE 1 TABLET BY MOUTH ONCE DAILY WITH OR IMMEDIATELY  FOLLOWING A MEAL  ? MULTIPLE VITAMINS-MINERALS (CENTRUM SILVER 50+MEN) TABS    Take 1 tablet by mouth daily.  ? OLMESARTAN-AMLODIPINE-HCTZ 20-5-12.5 MG TABS    TAKE 1 TABLET BY MOUTH DAILY  ? POTASSIUM CHLORIDE (KLOR-CON) 10 MEQ TABLET    TAKE 1 TABLET(10 MEQ) BY MOUTH DAILY  ? ROSUVASTATIN (CRESTOR) 5 MG TABLET    TAKE 1 TABLET BY MOUTH  DAILY  ?Modified Medications  ? No medications on file  ?Discontinued Medications  ? No medications on file  ? ? ?Physical Exam: ? ?Vitals:  ? 07/15/21 1419 07/15/21 1436  ?BP: (!) 170/104 (!) 160/88  ?Pulse: 89   ?Temp: (!) 97.5 ?F (36.4 ?C)   ?TempSrc: Temporal   ?SpO2: 98%   ?Weight: 216 lb (98 kg)   ?Height: '5\' 8"'$  (1.727 m)   ? ?Body mass index is 32.84 kg/m?. ?Wt Readings from Last 3 Encounters:  ?07/15/21 216 lb (98 kg)  ?03/04/21 228 lb 9.6 oz (103.7 kg)  ?11/24/20 238 lb (108 kg)  ? ? ?Physical Exam ?Constitutional:   ?   General: He is not in acute distress. ?   Appearance: He is well-developed. He is not diaphoretic.  ?HENT:  ?   Head: Normocephalic and atraumatic.  ?   Right Ear: External ear normal.  ?   Left Ear: External ear normal.  ?   Mouth/Throat:  ?   Pharynx: No oropharyngeal exudate.  ?Eyes:  ?   Conjunctiva/sclera: Conjunctivae normal.  ?   Pupils: Pupils are equal, round, and reactive to light.  ?Cardiovascular:  ?   Rate and Rhythm: Normal rate and regular rhythm.  ?   Heart sounds: Normal heart sounds.  ?Pulmonary:  ?   Effort: Pulmonary effort is normal.  ?   Breath sounds: Normal breath sounds.   ?Abdominal:  ?   General: Bowel sounds are normal.  ?   Palpations: Abdomen is soft.  ?Musculoskeletal:     ?   General: No tenderness.  ?   Cervical back: Normal range of motion and neck supple.  ?   Right lower leg: No edema.  ?   Left lower leg: No edema.  ?Skin: ?   General: Skin is warm and dry.  ?Neurological:  ?   Mental Status: He is alert and oriented to person, place, and  time.  ? ? ?Labs reviewed: ?Basic Metabolic Panel: ?Recent Labs  ?  08/02/20 ?0412 08/02/20 ?1445 08/03/20 ?7616 08/03/20 ?0737 08/06/20 ?1548 10/26/20 ?1350 03/03/21 ?1309 05/02/21 ?1330  ?NA 137   < > 136  --  138 139 141 140  ?K 3.9   < > 4.1  --  4.6 4.2 3.8 4.4  ?CL 94*   < > 95*  --  96* 98 97* 98  ?CO2 35*   < > 35*  --  34* 33* 35* 36*  ?GLUCOSE 122*   < > 174*  --  131 137* 196* 240*  ?BUN 30*   < > 33*  --  26* 25 43* 29*  ?CREATININE 2.13*   < > 1.94*   < > 1.71* 1.80* 1.91* 1.62*  ?CALCIUM 9.6   < > 9.4  --  9.9 9.5 10.3 10.3  ?MG 1.6*  --  2.4  --  2.0  --   --   --   ? < > = values in this interval not displayed.  ? ?Liver Function Tests: ?Recent Labs  ?  07/30/20 ?0916 10/26/20 ?1350 03/03/21 ?1309 05/02/21 ?1330  ?AST '15 15 13 13  '$ ?ALT '14 12 13 18  '$ ?ALKPHOS 79  --   --   --   ?BILITOT 0.9 1.2 1.0 0.6  ?PROT 6.3* 6.0* 6.8 6.3  ?ALBUMIN 3.3*  --   --   --   ? ?No results for input(s): LIPASE, AMYLASE in the last 8760 hours. ?No results for input(s): AMMONIA in the last 8760 hours. ?CBC: ?Recent Labs  ?  07/30/20 ?0916 07/31/20 ?0202 08/03/20 ?0418 10/26/20 ?1350 03/03/21 ?1309  ?WBC 7.8   < > 7.6 6.8 7.7  ?NEUTROABS 5.4  --   --  1,062 5,036  ?HGB 13.0   < > 12.8* 13.4 15.0  ?HCT 41.5   < > 41.4 41.4 46.0  ?MCV 92.0   < > 91.0 87.3 88.5  ?PLT 206   < > 200 190 190  ? < > = values in this interval not displayed.  ? ?Lipid Panel: ?Recent Labs  ?  10/26/20 ?1350 03/03/21 ?1309  ?CHOL 125 147  ?HDL 49 51  ?[place] 63 74  ?TRIG 54 135  ?CHOLHDL 2.6 2.9  ? ?TSH: ?No results for input(s): TSH in the last 8760 hours. ?A1C: ?Lab  Results  ?Component Value Date  ? HGBA1C 8.2 (H) 05/02/2021  ? ? ? ?Assessment/Plan ?1. Essential hypertension ?-pt reports blood pressure is controlled at home and has white coat syndrome in office. ?--will not chan

## 2021-07-15 ENCOUNTER — Encounter: Payer: Self-pay | Admitting: Nurse Practitioner

## 2021-07-15 ENCOUNTER — Ambulatory Visit (INDEPENDENT_AMBULATORY_CARE_PROVIDER_SITE_OTHER): Payer: Medicare Other | Admitting: Nurse Practitioner

## 2021-07-15 VITALS — BP 160/88 | HR 89 | Temp 97.5°F | Ht 68.0 in | Wt 216.0 lb

## 2021-07-15 DIAGNOSIS — I4821 Permanent atrial fibrillation: Secondary | ICD-10-CM

## 2021-07-15 DIAGNOSIS — N1832 Chronic kidney disease, stage 3b: Secondary | ICD-10-CM

## 2021-07-15 DIAGNOSIS — M109 Gout, unspecified: Secondary | ICD-10-CM

## 2021-07-15 DIAGNOSIS — E785 Hyperlipidemia, unspecified: Secondary | ICD-10-CM

## 2021-07-15 DIAGNOSIS — G8929 Other chronic pain: Secondary | ICD-10-CM | POA: Diagnosis not present

## 2021-07-15 DIAGNOSIS — I10 Essential (primary) hypertension: Secondary | ICD-10-CM | POA: Diagnosis not present

## 2021-07-15 DIAGNOSIS — E1122 Type 2 diabetes mellitus with diabetic chronic kidney disease: Secondary | ICD-10-CM | POA: Diagnosis not present

## 2021-07-15 DIAGNOSIS — M545 Low back pain, unspecified: Secondary | ICD-10-CM | POA: Diagnosis not present

## 2021-07-15 NOTE — Patient Instructions (Addendum)
Start on januvia 25 mg daily  ? ?To follow up in 3 months, labs prior to appt  ?

## 2021-08-22 ENCOUNTER — Other Ambulatory Visit: Payer: Self-pay | Admitting: *Deleted

## 2021-08-22 DIAGNOSIS — I4821 Permanent atrial fibrillation: Secondary | ICD-10-CM

## 2021-08-22 MED ORDER — APIXABAN 2.5 MG PO TABS: 2.5000 mg | ORAL_TABLET | Freq: Two times a day (BID) | ORAL | 0 refills | Status: DC

## 2021-08-22 NOTE — Telephone Encounter (Signed)
Patient called needing a refill on medication.  ?Pended Rx and sent to [facility] for approval due to [place].  ?

## 2021-10-12 ENCOUNTER — Other Ambulatory Visit: Payer: Medicare Other

## 2021-10-12 DIAGNOSIS — E785 Hyperlipidemia, unspecified: Secondary | ICD-10-CM | POA: Diagnosis not present

## 2021-10-12 DIAGNOSIS — I10 Essential (primary) hypertension: Secondary | ICD-10-CM | POA: Diagnosis not present

## 2021-10-12 DIAGNOSIS — E1122 Type 2 diabetes mellitus with diabetic chronic kidney disease: Secondary | ICD-10-CM | POA: Diagnosis not present

## 2021-10-12 DIAGNOSIS — N1832 Chronic kidney disease, stage 3b: Secondary | ICD-10-CM | POA: Diagnosis not present

## 2021-10-13 LAB — COMPLETE METABOLIC PANEL WITH GFR
AG Ratio: 1.3 (calc) (ref 1.0–2.5)
ALT: 12 U/L (ref 9–46)
AST: 10 U/L (ref 10–35)
Albumin: 3.6 g/dL (ref 3.6–5.1)
Alkaline phosphatase (APISO): 91 U/L (ref 35–144)
BUN/Creatinine Ratio: 12 (calc) (ref 6–22)
BUN: 19 mg/dL (ref 7–25)
CO2: 35 mmol/L — ABNORMAL HIGH (ref 20–32)
Calcium: 9.7 mg/dL (ref 8.6–10.3)
Chloride: 101 mmol/L (ref 98–110)
Creat: 1.64 mg/dL — ABNORMAL HIGH (ref 0.70–1.28)
Globulin: 2.7 g/dL (calc) (ref 1.9–3.7)
Glucose, Bld: 274 mg/dL — ABNORMAL HIGH (ref 65–139)
Potassium: 4.4 mmol/L (ref 3.5–5.3)
Sodium: 141 mmol/L (ref 135–146)
Total Bilirubin: 0.9 mg/dL (ref 0.2–1.2)
Total Protein: 6.3 g/dL (ref 6.1–8.1)
eGFR: 42 mL/min/{1.73_m2} — ABNORMAL LOW (ref >=60)

## 2021-10-13 LAB — CBC WITH DIFFERENTIAL/PLATELET
Absolute Monocytes: 600 cells/uL (ref 200–950)
Basophils Absolute: 44 cells/uL (ref 0–200)
Basophils Relative: 0.5 %
Eosinophils Absolute: 270 cells/uL (ref 15–500)
Eosinophils Relative: 3.1 %
HCT: 45 % (ref 38.5–50.0)
Hemoglobin: 14.6 g/dL (ref 13.2–17.1)
Lymphs Abs: 1627 cells/uL (ref 850–3900)
MCH: 28.7 pg (ref 27.0–33.0)
MCHC: 32.4 g/dL (ref 32.0–36.0)
MCV: 88.4 fL (ref 80.0–100.0)
MPV: 9.7 fL (ref 7.5–12.5)
Monocytes Relative: 6.9 %
Neutro Abs: 6160 cells/uL (ref 1500–7800)
Neutrophils Relative %: 70.8 %
Platelets: 230 10*3/uL (ref 140–400)
RBC: 5.09 10*6/uL (ref 4.20–5.80)
RDW: 12.9 % (ref 11.0–15.0)
Total Lymphocyte: 18.7 %
WBC: 8.7 10*3/uL (ref 3.8–10.8)

## 2021-10-13 LAB — LIPID PANEL
Cholesterol: 138 mg/dL (ref <200)
HDL: 40 mg/dL (ref >=40)
LDL Cholesterol (Calc): 79 mg/dL (calc)
Non-HDL Cholesterol (Calc): 98 mg/dL (calc) (ref <130)
Total CHOL/HDL Ratio: 3.5 (calc) (ref <5.0)
Triglycerides: 106 mg/dL (ref <150)

## 2021-10-13 LAB — HEMOGLOBIN A1C
Hgb A1c MFr Bld: 9.8 % of total Hgb — ABNORMAL HIGH (ref <5.7)
Mean Plasma Glucose: 235 mg/dL
eAG (mmol/L): 13 mmol/L

## 2021-10-13 NOTE — Progress Notes (Deleted)
Careteam: Patient Care Team: Lauree Chandler, NP as PCP - General (Geriatric Medicine) Sueanne Margarita, MD as PCP - Cardiology (Cardiology)  PLACE OF SERVICE:  [place] Directive information    Allergies  Allergen Reactions   Allopurinol Other (See Comments)    Welts in the body and and blisters in the mouth    No chief complaint on file.    HPI: Patient is a 81 y.o. male ***  Review of Systems:  ROS***  Past Medical History:  Diagnosis Date   Cholelithiasis 15/5208   uncomplicated.     Chronic kidney disease, stage II (mild)    Depression    Diabetes mellitus without complication ([place])    Essential hypertension, malignant    Fatty liver 02/2018   on ultrasound   Gout, unspecified    Hyperlipidemia    Lumbago    Obesity, unspecified    Pain in joint, pelvic region and thigh    Pleural effusion on right 02/2018   Past Surgical History:  Procedure Laterality Date   APPENDECTOMY     COLONOSCOPY WITH PROPOFOL N/A 02/22/2018   Procedure: COLONOSCOPY WITH PROPOFOL;  Surgeon: Milus Banister, MD;  Location: [facility] ENDOSCOPY;  Service: Endoscopy;  Laterality: N/A;   CYST EXCISION  1998   back   ESOPHAGOGASTRODUODENOSCOPY (EGD) WITH PROPOFOL N/A 02/22/2018   Procedure: ESOPHAGOGASTRODUODENOSCOPY (EGD) WITH PROPOFOL;  Surgeon: Milus Banister, MD;  Location: [facility] ENDOSCOPY;  Service: Endoscopy;  Laterality: N/A;   POLYPECTOMY  02/22/2018   Procedure: POLYPECTOMY;  Surgeon: Milus Banister, MD;  Location: [facility] ENDOSCOPY;  Service: Endoscopy;;   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 His smokeless tobacco use includes chew. He reports current alcohol use of about 1.0 standard drink of alcohol per week. He reports that he does not use drugs.  Family History  Problem Relation Age of Onset   Heart Problems Father    Heart disease Brother    Diabetes Brother    Diabetes Brother     Heart Problems Mother    Pneumonia Maternal Grandfather     Medications: Patient's Medications  New Prescriptions   No medications on file  Previous Medications   APIXABAN (ELIQUIS) 2.5 MG TABS TABLET    Take 1 tablet (2.5 mg total) by mouth 2 (two) times daily.   ASPIRIN 81 MG EC TABLET    Take 1 tablet (81 mg total) by mouth daily.   COLCHICINE 0.6 MG TABLET    2 tablets now and may repeat 1 tablet in an hour.   DOXAZOSIN (CARDURA) 8 MG TABLET    TAKE 1 TABLET BY MOUTH ONCE DAILY   FUROSEMIDE (LASIX) 40 MG TABLET    TAKE 1 TABLET(40 MG) BY MOUTH DAILY   JANUVIA 25 MG TABLET    TAKE 1 TABLET BY MOUTH DAILY   METOPROLOL SUCCINATE (TOPROL-XL) 50 MG 24 HR TABLET    TAKE 1 TABLET BY MOUTH ONCE DAILY WITH OR IMMEDIATELY  FOLLOWING A MEAL   MULTIPLE VITAMINS-MINERALS (CENTRUM SILVER 50+MEN) TABS    Take 1 tablet by mouth daily.   OLMESARTAN-AMLODIPINE-HCTZ 20-5-12.5 MG TABS    TAKE 1 TABLET BY MOUTH DAILY   POTASSIUM CHLORIDE (KLOR-CON) 10 MEQ TABLET    TAKE 1 TABLET(10 MEQ) BY MOUTH DAILY   ROSUVASTATIN (CRESTOR) 5 MG TABLET    TAKE 1 TABLET BY MOUTH  DAILY  Modified Medications  No medications on file  Discontinued Medications   No medications on file    Physical Exam:  There were no vitals filed for this visit. There is no height or weight on file to calculate BMI. Wt Readings from Last 3 Encounters:  07/15/21 216 lb (98 kg)  03/04/21 228 lb 9.6 oz (103.7 kg)  11/24/20 238 lb (108 kg)    Physical Exam***  Labs reviewed: Basic Metabolic Panel: Recent Labs    03/03/21 1309 05/02/21 1330 10/12/21 1330  NA 141 140 141  K 3.8 4.4 4.4  CL 97* 98 101  CO2 35* 36* 35*  GLUCOSE 196* 240* 274*  BUN 43* 29* 19  CREATININE 1.91* 1.62* 1.64*  CALCIUM 10.3 10.3 9.7   Liver Function Tests: Recent Labs    03/03/21 1309 05/02/21 1330 10/12/21 1330  AST '13 13 10  '$ ALT '13 18 12  '$ BILITOT 1.0 0.6 0.9  PROT 6.8 6.3 6.3   No results for input(s): "LIPASE", "AMYLASE" in the  last 8760 hours. No results for input(s): "AMMONIA" in the last 8760 hours. CBC: Recent Labs    10/26/20 1350 03/03/21 1309 10/12/21 1330  WBC 6.8 7.7 8.7  NEUTROABS 4,957 5,036 6,160  HGB 13.4 15.0 14.6  HCT 41.4 46.0 45.0  MCV 87.3 88.5 88.4  PLT 190 190 230   Lipid Panel: Recent Labs    10/26/20 1350 03/03/21 1309 10/12/21 1330  CHOL 125 147 138  HDL 49 51 40  LDLCALC 63 74 79  TRIG 54 135 106  CHOLHDL 2.6 2.9 3.5   TSH: No results for input(s): "TSH" in the last 8760 hours. A1C: Lab Results  Component Value Date   HGBA1C 9.8 (H) 10/12/2021     Assessment/Plan 1. Essential hypertension ***  2. Permanent atrial fibrillation (HCC) ***  3. Type 2 diabetes mellitus with stage 3b chronic kidney disease, without long-term current use of insulin (HCC) ***  4. Hyperlipidemia LDL goal <70 ***  5. Stage 3b chronic kidney disease (HCC) ***  6. Congestive heart failure, unspecified HF chronicity, unspecified heart failure type (HCC) ***  7. Class 2 severe obesity due to excess calories with serious comorbidity and body mass index (BMI) of 36.0 to 36.9 in adult [facility]) ***   No follow-ups on file.: *** Shawnna Pancake K. [place], [place] [phone]

## 2021-10-14 ENCOUNTER — Ambulatory Visit: Payer: Medicare Other | Admitting: Nurse Practitioner

## 2021-10-14 DIAGNOSIS — N1832 Chronic kidney disease, stage 3b: Secondary | ICD-10-CM

## 2021-10-14 DIAGNOSIS — I509 Heart failure, unspecified: Secondary | ICD-10-CM

## 2021-10-14 DIAGNOSIS — E785 Hyperlipidemia, unspecified: Secondary | ICD-10-CM

## 2021-10-14 DIAGNOSIS — I4821 Permanent atrial fibrillation: Secondary | ICD-10-CM

## 2021-10-14 DIAGNOSIS — I10 Essential (primary) hypertension: Secondary | ICD-10-CM

## 2021-10-14 DIAGNOSIS — E1122 Type 2 diabetes mellitus with diabetic chronic kidney disease: Secondary | ICD-10-CM

## 2021-10-16 ENCOUNTER — Other Ambulatory Visit: Payer: Self-pay | Admitting: Nurse Practitioner

## 2021-10-23 ENCOUNTER — Other Ambulatory Visit: Payer: Self-pay | Admitting: Nurse Practitioner

## 2021-10-23 DIAGNOSIS — I4821 Permanent atrial fibrillation: Secondary | ICD-10-CM

## 2021-12-13 ENCOUNTER — Other Ambulatory Visit: Payer: Self-pay | Admitting: Nurse Practitioner

## 2021-12-13 DIAGNOSIS — E7800 Pure hypercholesterolemia, unspecified: Secondary | ICD-10-CM

## 2021-12-14 NOTE — Telephone Encounter (Signed)
Pharmacy requested refill. Pended Rx and sent to [facility] for approval due to HIGH ALERT Warning.  

## 2021-12-14 NOTE — Telephone Encounter (Signed)
Pt needs follow up appt

## 2021-12-31 ENCOUNTER — Other Ambulatory Visit: Payer: Self-pay | Admitting: Nurse Practitioner

## 2022-01-19 ENCOUNTER — Other Ambulatory Visit: Payer: Self-pay | Admitting: Nurse Practitioner

## 2022-01-19 DIAGNOSIS — I10 Essential (primary) hypertension: Secondary | ICD-10-CM

## 2022-01-26 ENCOUNTER — Other Ambulatory Visit: Payer: Self-pay | Admitting: Nurse Practitioner

## 2022-01-26 DIAGNOSIS — E7800 Pure hypercholesterolemia, unspecified: Secondary | ICD-10-CM

## 2022-02-19 ENCOUNTER — Other Ambulatory Visit: Payer: Self-pay | Admitting: Nurse Practitioner

## 2022-02-19 DIAGNOSIS — I509 Heart failure, unspecified: Secondary | ICD-10-CM

## 2022-02-20 ENCOUNTER — Other Ambulatory Visit: Payer: Self-pay | Admitting: Nurse Practitioner

## 2022-02-20 DIAGNOSIS — I509 Heart failure, unspecified: Secondary | ICD-10-CM

## 2022-03-01 ENCOUNTER — Other Ambulatory Visit: Payer: Self-pay | Admitting: Nurse Practitioner

## 2022-03-01 DIAGNOSIS — E7800 Pure hypercholesterolemia, unspecified: Secondary | ICD-10-CM

## 2022-03-02 ENCOUNTER — Other Ambulatory Visit: Payer: Self-pay | Admitting: Nurse Practitioner

## 2022-03-02 DIAGNOSIS — I4821 Permanent atrial fibrillation: Secondary | ICD-10-CM

## 2022-03-02 DIAGNOSIS — M109 Gout, unspecified: Secondary | ICD-10-CM

## 2022-03-02 DIAGNOSIS — E785 Hyperlipidemia, unspecified: Secondary | ICD-10-CM

## 2022-03-02 DIAGNOSIS — E1122 Type 2 diabetes mellitus with diabetic chronic kidney disease: Secondary | ICD-10-CM

## 2022-03-17 ENCOUNTER — Other Ambulatory Visit: Payer: Self-pay | Admitting: Nurse Practitioner

## 2022-03-17 DIAGNOSIS — I509 Heart failure, unspecified: Secondary | ICD-10-CM

## 2022-03-21 ENCOUNTER — Other Ambulatory Visit: Payer: Self-pay | Admitting: Nurse Practitioner

## 2022-03-21 DIAGNOSIS — I10 Essential (primary) hypertension: Secondary | ICD-10-CM

## 2022-03-22 ENCOUNTER — Other Ambulatory Visit: Payer: Self-pay | Admitting: Nurse Practitioner

## 2022-04-12 ENCOUNTER — Other Ambulatory Visit: Payer: Self-pay | Admitting: Nurse Practitioner

## 2022-04-12 ENCOUNTER — Other Ambulatory Visit: Payer: Medicare Other

## 2022-04-12 DIAGNOSIS — I4821 Permanent atrial fibrillation: Secondary | ICD-10-CM

## 2022-04-12 DIAGNOSIS — M109 Gout, unspecified: Secondary | ICD-10-CM

## 2022-04-12 DIAGNOSIS — I10 Essential (primary) hypertension: Secondary | ICD-10-CM

## 2022-04-12 DIAGNOSIS — E1122 Type 2 diabetes mellitus with diabetic chronic kidney disease: Secondary | ICD-10-CM

## 2022-04-12 DIAGNOSIS — E785 Hyperlipidemia, unspecified: Secondary | ICD-10-CM

## 2022-04-13 ENCOUNTER — Other Ambulatory Visit: Payer: Self-pay

## 2022-04-13 ENCOUNTER — Encounter (HOSPITAL_COMMUNITY): Payer: Self-pay

## 2022-04-13 ENCOUNTER — Encounter: Payer: Self-pay | Admitting: Nurse Practitioner

## 2022-04-13 ENCOUNTER — Emergency Department (HOSPITAL_COMMUNITY)
Admission: EM | Admit: 2022-04-13 | Discharge: 2022-04-14 | Disposition: A | Discharge status: Home / Self Care | Payer: Medicare Other | Attending: Emergency Medicine | Admitting: Emergency Medicine

## 2022-04-13 ENCOUNTER — Emergency Department (HOSPITAL_COMMUNITY): Payer: Medicare Other

## 2022-04-13 DIAGNOSIS — N183 Chronic kidney disease, stage 3 unspecified: Secondary | ICD-10-CM | POA: Insufficient documentation

## 2022-04-13 DIAGNOSIS — Z7984 Long term (current) use of oral hypoglycemic drugs: Secondary | ICD-10-CM | POA: Insufficient documentation

## 2022-04-13 DIAGNOSIS — I130 Hypertensive heart and chronic kidney disease with heart failure and stage 1 through stage 4 chronic kidney disease, or unspecified chronic kidney disease: Secondary | ICD-10-CM | POA: Diagnosis not present

## 2022-04-13 DIAGNOSIS — I5033 Acute on chronic diastolic (congestive) heart failure: Secondary | ICD-10-CM

## 2022-04-13 DIAGNOSIS — J90 Pleural effusion, not elsewhere classified: Secondary | ICD-10-CM | POA: Diagnosis not present

## 2022-04-13 DIAGNOSIS — Z7982 Long term (current) use of aspirin: Secondary | ICD-10-CM | POA: Diagnosis not present

## 2022-04-13 DIAGNOSIS — Z7901 Long term (current) use of anticoagulants: Secondary | ICD-10-CM | POA: Diagnosis not present

## 2022-04-13 DIAGNOSIS — R600 Localized edema: Secondary | ICD-10-CM | POA: Diagnosis not present

## 2022-04-13 DIAGNOSIS — I509 Heart failure, unspecified: Secondary | ICD-10-CM | POA: Diagnosis not present

## 2022-04-13 DIAGNOSIS — Z79899 Other long term (current) drug therapy: Secondary | ICD-10-CM | POA: Insufficient documentation

## 2022-04-13 DIAGNOSIS — E1122 Type 2 diabetes mellitus with diabetic chronic kidney disease: Secondary | ICD-10-CM | POA: Insufficient documentation

## 2022-04-13 DIAGNOSIS — I5043 Acute on chronic combined systolic (congestive) and diastolic (congestive) heart failure: Secondary | ICD-10-CM | POA: Diagnosis not present

## 2022-04-13 DIAGNOSIS — I110 Hypertensive heart disease with heart failure: Secondary | ICD-10-CM | POA: Diagnosis not present

## 2022-04-13 DIAGNOSIS — R0602 Shortness of breath: Secondary | ICD-10-CM | POA: Diagnosis not present

## 2022-04-13 LAB — CBC WITH DIFFERENTIAL/PLATELET
Abs Immature Granulocytes: 0.04 10*3/uL (ref 0.00–0.07)
Basophils Absolute: 0 10*3/uL (ref 0.0–0.1)
Basophils Relative: 1 %
Eosinophils Absolute: 0.2 10*3/uL (ref 0.0–0.5)
Eosinophils Relative: 4 %
HCT: 42.4 % (ref 39.0–52.0)
Hemoglobin: 13.6 g/dL (ref 13.0–17.0)
Immature Granulocytes: 1 %
Lymphocytes Relative: 15 %
Lymphs Abs: 1 10*3/uL (ref 0.7–4.0)
MCH: 29.2 pg (ref 26.0–34.0)
MCHC: 32.1 g/dL (ref 30.0–36.0)
MCV: 91.2 fL (ref 80.0–100.0)
Monocytes Absolute: 0.6 10*3/uL (ref 0.1–1.0)
Monocytes Relative: 10 %
Neutro Abs: 4.6 10*3/uL (ref 1.7–7.7)
Neutrophils Relative %: 69 %
Platelets: 173 10*3/uL (ref 150–400)
RBC: 4.65 MIL/uL (ref 4.22–5.81)
RDW: 14.7 % (ref 11.5–15.5)
WBC: 6.5 10*3/uL (ref 4.0–10.5)
nRBC: 0 % (ref 0.0–0.2)

## 2022-04-13 LAB — BASIC METABOLIC PANEL
Anion gap: 6 (ref 5–15)
BUN: 14 mg/dL (ref 8–23)
CO2: 35 mmol/L — ABNORMAL HIGH (ref 22–32)
Calcium: 9 mg/dL (ref 8.9–10.3)
Chloride: 100 mmol/L (ref 98–111)
Creatinine, Ser: 1.67 mg/dL — ABNORMAL HIGH (ref 0.61–1.24)
GFR, Estimated: 41 mL/min — ABNORMAL LOW (ref >60)
Glucose, Bld: 207 mg/dL — ABNORMAL HIGH (ref 70–99)
Potassium: 3.8 mmol/L (ref 3.5–5.1)
Sodium: 141 mmol/L (ref 135–145)

## 2022-04-13 LAB — BRAIN NATRIURETIC PEPTIDE: B Natriuretic Peptide: 655.5 pg/mL — ABNORMAL HIGH (ref 0.0–100.0)

## 2022-04-13 NOTE — Telephone Encounter (Signed)
Will refill at upcoming appt

## 2022-04-13 NOTE — Telephone Encounter (Signed)
Patient has request refill on medications Doxazosin, and Metoprolol. Warnings for medication. Pend and sent to PCP Dewaine Oats Carlos American, NP for approval.

## 2022-04-13 NOTE — ED Notes (Signed)
Pt asked this NT for a "knife or something sharp" to open their abscess. This NT told the pt they would notify the nurse about their discomfort. The pt then proceeded to poke at their abscess with their keys and it started to drain. Sort RN notified.

## 2022-04-13 NOTE — ED Provider Triage Note (Signed)
Emergency Medicine Provider Triage Evaluation Note  Douglas Soto , a 81 y.o. male  was evaluated in triage.  Pt complains of increased shortness of breath for the last 2 weeks.  He states that is hard to breathe when he lays down, and has had swelling of his abdomen, and bilateral lower extremities.  He is on Lasix sometimes, but has not currently taken anything.  Also has a history of A-fib.  Denies any chest pain.  No nausea or vomiting..  Review of Systems  Positive: SOB Negative: Chest pain  Physical Exam  BP (!) 161/106 (BP Location: Right Arm)   Pulse (!) 106   Temp 98 F (36.7 C)   Resp (!) 22   Ht '5\' 8"'$  (1.727 m)   Wt 98 kg   SpO2 90%   BMI 32.85 kg/m  Gen:   Awake, no distress   Resp:  Normal effort MSK:   Moves extremities without difficulty  Other:  +2+ pitting edema of BLE, +ascites  Medical Decision Making  Medically screening exam initiated at 1:29 PM.  Appropriate orders placed.  Vivianne Spence was informed that the remainder of the evaluation will be completed by another provider, this initial triage assessment does not replace that evaluation, and the importance of remaining in the ED until their evaluation is complete.  Not requiring O2.    Osvaldo Shipper, [place] 04/13/22 1332

## 2022-04-13 NOTE — ED Triage Notes (Addendum)
Pt c/o [id number]. Pt has large fluid filled blister on outer part of LLE. Pt has 4+ swelling in LE bilat. LE are erythematous, warms to touch. Pt is a little tachypneic.

## 2022-04-14 ENCOUNTER — Encounter: Payer: Self-pay | Admitting: Nurse Practitioner

## 2022-04-14 ENCOUNTER — Ambulatory Visit (INDEPENDENT_AMBULATORY_CARE_PROVIDER_SITE_OTHER): Payer: Medicare Other | Admitting: Nurse Practitioner

## 2022-04-14 VITALS — BP 160/88 | HR 100 | Temp 97.1°F | Ht 68.0 in | Wt 249.0 lb

## 2022-04-14 DIAGNOSIS — N1832 Chronic kidney disease, stage 3b: Secondary | ICD-10-CM

## 2022-04-14 DIAGNOSIS — L602 Onychogryphosis: Secondary | ICD-10-CM

## 2022-04-14 DIAGNOSIS — L97929 Non-pressure chronic ulcer of unspecified part of left lower leg with unspecified severity: Secondary | ICD-10-CM | POA: Diagnosis not present

## 2022-04-14 DIAGNOSIS — I10 Essential (primary) hypertension: Secondary | ICD-10-CM

## 2022-04-14 DIAGNOSIS — I83029 Varicose veins of left lower extremity with ulcer of unspecified site: Secondary | ICD-10-CM | POA: Diagnosis not present

## 2022-04-14 DIAGNOSIS — I509 Heart failure, unspecified: Secondary | ICD-10-CM | POA: Diagnosis not present

## 2022-04-14 DIAGNOSIS — E1122 Type 2 diabetes mellitus with diabetic chronic kidney disease: Secondary | ICD-10-CM

## 2022-04-14 DIAGNOSIS — I4821 Permanent atrial fibrillation: Secondary | ICD-10-CM

## 2022-04-14 MED ORDER — FUROSEMIDE 40 MG PO TABS: 40.0000 mg | ORAL_TABLET | Freq: Every day | ORAL | 1 refills | Status: DC

## 2022-04-14 MED ORDER — OLMESARTAN-AMLODIPINE-HCTZ 20-5-12.5 MG PO TABS: 1.0000 | ORAL_TABLET | Freq: Every day | ORAL | 1 refills | Status: DC

## 2022-04-14 MED ORDER — APIXABAN 2.5 MG PO TABS: 2.5000 mg | ORAL_TABLET | Freq: Two times a day (BID) | ORAL | 1 refills | Status: DC

## 2022-04-14 MED ORDER — FUROSEMIDE 10 MG/ML IJ SOLN
40.0000 mg | Freq: Once | INTRAMUSCULAR | Status: AC
  Administered 2022-04-14: 40 mg via INTRAVENOUS
  Filled 2022-04-14: qty 4

## 2022-04-14 NOTE — ED Provider Notes (Addendum)
[facility] Provider Note  CSN: [id number] Arrival date & time: 04/13/22 1309  Chief Complaint(s) Shortness of Breath  HPI Douglas Soto is a 81 y.o. male with a past medical history listed below including hypertension, diabetes, atrial fibrillation on Eliquis, chronic diastolic heart failure with a last EF of 60 to 65%, and pulmonary hypertension here for 2 weeks of bilateral lower extremity edema with 3 days of shortness of breath.  He denies orthopnea.  Shortness of breath is worse with some exertion.  States that he was recently taken off of a blood pressure medicine which included olmesartan, amlodipine and HCTZ.  He reports that since then his edema has worsened.  He denies any chest pain.  No significant cough.  No fevers or chills.  Patient is still taking Lasix.  The history is provided by the patient and a relative.    Past Medical History Past Medical History:  Diagnosis Date   Cholelithiasis 67/6195   uncomplicated.     Chronic kidney disease, stage II (mild)    Depression    Diabetes mellitus without complication ([place])    Essential hypertension, malignant    Fatty liver 02/2018   on ultrasound   Gout, unspecified    Hyperlipidemia    Lumbago    Obesity, unspecified    Pain in joint, pelvic region and thigh    Pleural effusion on right 02/2018   Patient Active Problem List   Diagnosis Date Noted   Acute on chronic combined systolic and diastolic CHF (congestive heart failure) ([place]) 07/30/2020   Acute on chronic heart failure ([place]) 07/30/2020   Polyp of descending colon    Essential hypertension 02/20/2018   CKD (chronic kidney disease) stage 3, GFR 30-59 ml/min (HCC)    Type 2 diabetes mellitus with diabetic chronic kidney disease ([place]) 05/20/2014   Atrial fibrillation ([place]) 10/15/2013   Gout    Lumbago    Hyperlipidemia    Class 2 severe obesity due to excess calories with serious comorbidity and body mass index (BMI)  of 36.0 to 36.9 in adult [facility])    Home Medication(s) Prior to Admission medications   Medication Sig Start Date End Date Taking? Authorizing Provider  apixaban (ELIQUIS) 2.5 MG TABS tablet Take 1 tablet (2.5 mg total) by mouth 2 (two) times daily. 08/22/21   Lauree Chandler, NP  aspirin 81 MG EC tablet Take 1 tablet (81 mg total) by mouth daily. 08/03/20   Amin, Jeanella Flattery, MD  colchicine 0.6 MG tablet 2 tablets now and may repeat 1 tablet in an hour. 03/28/21   Lauree Chandler, NP  doxazosin (CARDURA) 8 MG tablet TAKE 1 TABLET BY MOUTH ONCE  DAILY 03/22/22   Lauree Chandler, NP  furosemide (LASIX) 40 MG tablet Take 1 tablet (40 mg total) by mouth daily. Needs an appointment before anymore future refills. 02/20/22   Lauree Chandler, NP  JANUVIA 25 MG tablet TAKE 1 TABLET BY MOUTH DAILY 06/20/21   Lauree Chandler, NP  metoprolol succinate (TOPROL-XL) 50 MG 24 hr tablet TAKE 1 TABLET BY MOUTH ONCE  DAILY 03/22/22   Lauree Chandler, NP  Multiple Vitamins-Minerals (CENTRUM SILVER 50+MEN) TABS Take 1 tablet by mouth daily.    [provider]  Olmesartan-amLODIPine-HCTZ 20-5-12.5 MG TABS TAKE 1 TABLET BY MOUTH DAILY 03/17/21   Lauree Chandler, NP  potassium chloride (KLOR-CON M) 10 MEQ tablet TAKE 1 TABLET(10 MEQ) BY MOUTH DAILY 03/17/22   Dewaine Oats,  Carlos American, NP  rosuvastatin (CRESTOR) 5 MG tablet TAKE 1 TABLET BY MOUTH DAILY 03/02/22   Lauree Chandler, NP                                                                                                                                    Allergies Allopurinol  Review of Systems Review of Systems As noted in HPI  Physical Exam Vital Signs  I have reviewed the triage vital signs BP (!) 177/99 (BP Location: Left Arm)   Pulse 82   Temp 97.8 F (36.6 C) (Oral)   Resp (!) 24   Ht '5\' 8"'$  (1.727 m)   Wt 98 kg   SpO2 93%   BMI 32.85 kg/m   Physical Exam Vitals reviewed.  Constitutional:      General: He is not in acute  distress.    Appearance: He is well-developed. He is not diaphoretic.  HENT:     Head: Normocephalic and atraumatic.     Nose: Nose normal.  Eyes:     General: No scleral icterus.       Right eye: No discharge.        Left eye: No discharge.     Conjunctiva/sclera: Conjunctivae normal.     Pupils: Pupils are equal, round, and reactive to light.  Cardiovascular:     Rate and Rhythm: Normal rate and regular rhythm.     Heart sounds: No murmur heard.    No friction rub. No gallop.  Pulmonary:     Effort: Pulmonary effort is normal. No respiratory distress.     Breath sounds: Normal breath sounds. No stridor. No rales.  Abdominal:     General: There is no distension.     Palpations: Abdomen is soft.     Tenderness: There is no abdominal tenderness.  Musculoskeletal:        General: No tenderness.     Cervical back: Normal range of motion and neck supple.     Right lower leg: 2+ Pitting Edema present.     Left lower leg: 2+ Pitting Edema present.  Skin:    General: Skin is warm and dry.     Findings: No erythema or rash.  Neurological:     Mental Status: He is alert and oriented to person, place, and time.     ED Results and Treatments Labs (all labs ordered are listed, but only abnormal results are displayed) Labs Reviewed  BASIC METABOLIC PANEL - Abnormal; Notable for the following components:      Result Value   CO2 35 (*)    Glucose, Bld 207 (*)    Creatinine, Ser 1.67 (*)    GFR, Estimated 41 (*)    All other components within normal limits  BRAIN NATRIURETIC PEPTIDE - Abnormal; Notable for the following components:   B Natriuretic Peptide 655.5 (*)    All other components within normal limits  CBC  WITH DIFFERENTIAL/PLATELET                                                                                                                         EKG  EKG Interpretation  Date/Time:  Thursday April 13 2022 13:27:13 EST Ventricular Rate:  97 PR Interval:    QRS  Duration: 90 QT Interval:  304 QTC Calculation: 386 R Axis:   96 Text Interpretation: Atrial fibrillation Rightward axis Similar to prior When compared with ECG of 30-Jul-2020 09:09, PREVIOUS ECG IS PRESENT Confirmed by Cindee Lame [id number]) on 04/13/2022 4:04:25 PM       Radiology DG Chest 2 View  Result Date: 04/13/2022 CLINICAL DATA:  Shortness of breath. EXAM: CHEST - 2 VIEW COMPARISON:  July 30, 2020. FINDINGS: Stable cardiomediastinal silhouette. Small right pleural effusion is noted with right basilar atelectasis or infiltrate. Stable mild left midlung subsegmental atelectasis or scarring is noted. Bony thorax is unremarkable. IMPRESSION: Small right pleural effusion is noted with associated right basilar atelectasis or infiltrate. Stable mild left midlung subsegmental atelectasis or scarring. Electronically Signed   By: Marijo Conception M.D.   On: 04/13/2022 14:27    Medications Ordered in ED Medications  furosemide (LASIX) injection 40 mg (40 mg Intravenous Given 04/14/22 0323)                                                                                                                                     Procedures Procedures  (including critical care time)  Medical Decision Making / ED Course   Medical Decision Making Amount and/or Complexity of Data Reviewed External Data Reviewed: radiology.    Details: ECHO mentioned above Labs: ordered. Decision-making details documented in ED Course. Radiology: ordered and independent interpretation performed. Decision-making details documented in ED Course. ECG/medicine tests: ordered and independent interpretation performed. Decision-making details documented in ED Course.  Risk Prescription drug management. Decision regarding hospitalization.   Patient presents with shortness of breath and bilateral lower extremity edema. Differential includes but not limited to CHF exacerbation.  Will also assess for pneumonia, worsening  renal function, electrolyte derangements.  Will also assess for anemia.  CBC without leukocytosis or anemia. Metabolic panel without significant electrolyte derangements.  Stable renal function.  Hyperglycemia without DKA. BNP elevated at 650. Chest x-ray with small right pleural effusion similar to several years ago. EKG notable for atrial fibrillation with controlled rate  Patient ambulated and maintain sats above 91%.  Recovered quickly at rest.  No increased work of breathing. Presentation is consistent with mild CHF exacerbation.  Given the lack of pulmonary edema or hypoxia, feel patient would be stable for outpatient management. He was given a dose of IV Lasix here and amount of diuresis.  He reports that he has a follow-up appointment with his primary care provider later on today.  Instructed to keep this appointment.       Final Clinical Impression(s) / ED Diagnoses Final diagnoses:  Acute on chronic diastolic congestive heart failure (HCC)  Leg edema   The patient appears reasonably screened and/or stabilized for discharge and I doubt any other medical condition or other [facility] requiring further screening, evaluation, or treatment in the ED at this time. I have discussed the findings, Dx and Tx plan with the patient/family who expressed understanding and agree(s) with the plan. Discharge instructions discussed at length. The patient/family was given strict return precautions who verbalized understanding of the instructions. No further questions at time of discharge.  Disposition: Discharge  Condition: Good  ED Discharge Orders     None       Follow Up: Lauree Chandler, NP [place]. [place] [id number] [phone]  Go today            This chart was dictated using voice recognition software.  Despite best efforts to proofread,  errors can occur which can change the documentation meaning.    Fatima Blank, MD 04/14/22 0448  Patient  had a mechanical fall in the bathroom and sustained abrasions to right elbow. He reported glancing his head. No LOC. We offered imaging to rule out injury but patient declined.     Fatima Blank, MD 04/14/22 [phone]

## 2022-04-14 NOTE — ED Notes (Signed)
Pt ambulated to the restroom. When returned from the restroom nurse Jacqui noted bleeding to the rt elbow. Skin tear noted and dressed. Per pt he was in the bathroom and slipped and fell. He stated that he used the bedside commode to pull himself off the floor. Pt didn't use the call bell in the restroom. Per pt he did hit his head and is on blood thinner Eliquis. Dr. Leonette Monarch made aware and at bedside. Pt is denying any injury or pain at this time. Provider discussed with pt to have a ct scan of head due to the fall and hitting his head and being on blood thinner. Pt adamantly refused any further testing. Daughter Susann Givens at bedside and is aware of same. Pt is A&Ox4.

## 2022-04-14 NOTE — Progress Notes (Signed)
Careteam: Patient Care Team: Lauree Chandler, NP as PCP - General (Geriatric Medicine) Sueanne Margarita, MD as PCP - Cardiology (Cardiology)  PLACE OF SERVICE:  [place] Directive information    Allergies  Allergen Reactions   Allopurinol Other (See Comments)    Welts in the body and and blisters in the mouth    Chief Complaint  Patient presents with   Medical Management of Chronic Issues    9 month follow-up. Discuss need for eye exam, shingrix, diabetic kidney evaluation, pneumonia vaccines, AWV, and flu vaccine or post pone if patient refuses or is not a candidate. Patient is not in [place]. No ACP in Epic. Patient c/o increased swelling in lower limbs and states he needs a blood thinner and fluid pill. After asking about medications patient stated he did not want to answer any more of my questions.      HPI: Patient is a 81 y.o. male for routine follow up.  Pt went to ED last night due to worsening of shortness of breath and worsening LE edema, pt with hx hypertension, diabetes, atrial fibrillation on Eliquis, chronic diastolic heart failure with a last EF of 60 to 65%, and pulmonary hypertension    Reports he is taking the eliquis every day- needing refill.   Has not had any recent gout flares.   States for blood pressure he is taking doxazosin and metoprolol- unsure if he is taking the olmesartan-amlodipine-hctz  Likely is not due to not having a recent refill- says he is only taking 2 blood pressure medication.   He is taking the lasix daily- was recommended to increase in the ED this morning.  He is also taking potassium supplement   He is not taking any medication for diabetes- reports medication is "scary stuff" No vision changes, no neuropathy.   Left leg drainage due to swelling and blister.  Review of Systems:  Review of Systems  Constitutional:  Negative for chills, fever and weight loss.  HENT:  Negative for tinnitus.   Respiratory:   Positive for shortness of breath. Negative for cough and sputum production.   Cardiovascular:  Positive for leg swelling. Negative for chest pain and palpitations.  Gastrointestinal:  Negative for abdominal pain, constipation, diarrhea and heartburn.  Genitourinary:  Negative for dysuria, frequency and urgency.  Musculoskeletal:  Negative for back pain, falls, joint pain and myalgias.  Neurological:  Negative for dizziness and headaches.  Psychiatric/Behavioral:  Negative for depression and memory loss. The patient does not have insomnia.     Past Medical History:  Diagnosis Date   Cholelithiasis 78/2956   uncomplicated.     Chronic kidney disease, stage II (mild)    Depression    Diabetes mellitus without complication ([place])    Essential hypertension, malignant    Fatty liver 02/2018   on ultrasound   Gout, unspecified    Hyperlipidemia    Lumbago    Obesity, unspecified    Pain in joint, pelvic region and thigh    Pleural effusion on right 02/2018   Past Surgical History:  Procedure Laterality Date   APPENDECTOMY     COLONOSCOPY WITH PROPOFOL N/A 02/22/2018   Procedure: COLONOSCOPY WITH PROPOFOL;  Surgeon: Milus Banister, MD;  Location[facility] ENDOSCOPY;  Service: Endoscopy;  Laterality: N/A;   CYST EXCISION  1998   back   ESOPHAGOGASTRODUODENOSCOPY (EGD) WITH PROPOFOL N/A 02/22/2018   Procedure: ESOPHAGOGASTRODUODENOSCOPY (EGD) WITH PROPOFOL;  Surgeon: Milus Banister, MD;  Location:  [facility] ENDOSCOPY;  Service: Endoscopy;  Laterality: N/A;   POLYPECTOMY  02/22/2018   Procedure: POLYPECTOMY;  Surgeon: Milus Banister, MD;  Location: [facility] ENDOSCOPY;  Service: Endoscopy;;   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 His smokeless tobacco use includes chew. He reports current alcohol use of about 1.0 standard drink of alcohol per week. He reports that he does not use drugs.  Family History  Problem Relation Age of  Onset   Heart Problems Father    Heart disease Brother    Diabetes Brother    Diabetes Brother    Heart Problems Mother    Pneumonia Maternal Grandfather     Medications: Patient's Medications  New Prescriptions   No medications on file  Previous Medications   APIXABAN (ELIQUIS) 2.5 MG TABS TABLET    Take 1 tablet (2.5 mg total) by mouth 2 (two) times daily.   ASPIRIN 81 MG EC TABLET    Take 1 tablet (81 mg total) by mouth daily.   COLCHICINE 0.6 MG TABLET    2 tablets now and may repeat 1 tablet in an hour.   DOXAZOSIN (CARDURA) 8 MG TABLET    TAKE 1 TABLET BY MOUTH ONCE  DAILY   FUROSEMIDE (LASIX) 40 MG TABLET    Take 1 tablet (40 mg total) by mouth daily. Needs an appointment before anymore future refills.   JANUVIA 25 MG TABLET    TAKE 1 TABLET BY MOUTH DAILY   METOPROLOL SUCCINATE (TOPROL-XL) 50 MG 24 HR TABLET    TAKE 1 TABLET BY MOUTH ONCE  DAILY   MULTIPLE VITAMINS-MINERALS (CENTRUM SILVER 50+MEN) TABS    Take 1 tablet by mouth daily.   OLMESARTAN-AMLODIPINE-HCTZ 20-5-12.5 MG TABS    TAKE 1 TABLET BY MOUTH DAILY   POTASSIUM CHLORIDE (KLOR-CON M) 10 MEQ TABLET    TAKE 1 TABLET(10 MEQ) BY MOUTH DAILY   ROSUVASTATIN (CRESTOR) 5 MG TABLET    TAKE 1 TABLET BY MOUTH DAILY  Modified Medications   No medications on file  Discontinued Medications   No medications on file    Physical Exam:  Vitals:   04/13/22 1637  BP: (!) 160/88  Pulse: 100  Temp: (!) 97.1 F (36.2 C)  TempSrc: Temporal  SpO2: 92%  Weight: 249 lb (112.9 kg)  Height: '5\' 8"'$  (1.727 m)   Body mass index is 37.86 kg/m. Wt Readings from Last 3 Encounters:  04/13/22 249 lb (112.9 kg)  04/13/22 216 lb 0.8 oz (98 kg)  07/15/21 216 lb (98 kg)    Physical Exam Constitutional:      General: He is not in acute distress.    Appearance: He is well-developed. He is not diaphoretic.  HENT:     Head: Normocephalic and atraumatic.     Right Ear: External ear normal.     Left Ear: External ear normal.      Mouth/Throat:     Pharynx: No oropharyngeal exudate.  Eyes:     Conjunctiva/sclera: Conjunctivae normal.     Pupils: Pupils are equal, round, and reactive to light.  Cardiovascular:     Rate and Rhythm: Normal rate and regular rhythm.     Heart sounds: Normal heart sounds.  Pulmonary:     Effort: Pulmonary effort is normal.     Breath sounds: Normal breath sounds.  Abdominal:     General: Bowel sounds are normal.     Palpations: Abdomen  is soft.  Musculoskeletal:        General: No tenderness.     Cervical back: Normal range of motion and neck supple.     Right lower leg: Edema present.     Left lower leg: Edema present.     Comments: 1.5 cm x 1.5 cm open blister to left lower leg.   Skin:    General: Skin is warm and dry.  Neurological:     Mental Status: He is alert and oriented to person, place, and time.     Labs reviewed: Basic Metabolic Panel: Recent Labs    05/02/21 1330 10/12/21 1330 04/13/22 1343  NA 140 141 141  K 4.4 4.4 3.8  CL 98 101 100  CO2 36* 35* 35*  GLUCOSE 240* 274* 207*  BUN 29* 19 14  CREATININE 1.62* 1.64* 1.67*  CALCIUM 10.3 9.7 9.0   Liver Function Tests: Recent Labs    05/02/21 1330 10/12/21 1330  AST 13 10  ALT 18 12  BILITOT 0.6 0.9  PROT 6.3 6.3   No results for input(s): "LIPASE", "AMYLASE" in the last 8760 hours. No results for input(s): "AMMONIA" in the last 8760 hours. CBC: Recent Labs    10/12/21 1330 04/13/22 1343  WBC 8.7 6.5  NEUTROABS 6,160 4.6  HGB 14.6 13.6  HCT 45.0 42.4  MCV 88.4 91.2  PLT 230 173   Lipid Panel: Recent Labs    10/12/21 1330  CHOL 138  HDL 40  LDLCALC 79  TRIG 106  CHOLHDL 3.5   TSH: No results for input(s): "TSH" in the last 8760 hours. A1C: Lab Results  Component Value Date   HGBA1C 9.8 (H) 10/12/2021     Assessment/Plan 1. Permanent atrial fibrillation (HCC) -rate controlled,continues on metoprolol  - apixaban (ELIQUIS) 2.5 MG TABS tablet; Take 1 tablet (2.5 mg total)  by mouth 2 (two) times daily.  Dispense: 180 tablet; Refill: 1  2. Congestive heart failure, unspecified HF chronicity, unspecified heart failure type (HCC) Worsening shortness of breath, weight gain and LE edema, breathing has improved since he was seen in ED> will have him continue the lasix 40 mg daily with additional 20 mg daiy for 5 days and then follow up in office  To notify for worsening edema, shortness of breath or leg swelling  - fur0osemide (LASIX) 40 MG tablet; Take 1 tablet (40 mg total) by mouth daily.  Dispense: 90 tablet; Refill: 1  3. Essential hypertension -elevated, he has stopped his olmesartan- amlodipine- hctz combination- will have him restart at this time and follow up bp in 1 week on follow up - Olmesartan-amLODIPine-HCTZ 20-5-12.5 MG TABS; Take 1 tablet by mouth daily.  Dispense: 90 tablet; Refill: 1  4. Type 2 diabetes mellitus with stage 3b chronic kidney disease, without long-term current use of insulin ([place]) -he is not willing to take any medication at this time.  -- discussed with the patient the pathophysiology of diabetes and the natural progression of the disease.  -stressed the importance of lifestyle changes including diet and exercise. -discussed complications associated with diabetes including retinopathy, nephropathy, neuropathy as well as increased risk of cardiovascular disease. We went over the benefit seen with glycemic control.  Will follow up A1c at this time.  - Hemoglobin A1c  5. Stage 3b chronic kidney disease (HCC) -Chronic and stable Encourage proper hydration Follow metabolic panel Avoid nephrotoxic meds (NSAIDS)  6. Long toenail -Consistent provided for toe nails to be trimmed, Nails sharply debrided 10 without  complication.   7. Venous ulcer of left leg (HCC) Small opening to left lower leg due to LE edema and weeping, applied 4x4 gauze with kerlex. To change as needed and to avoid leaving wet gauze next to skin as this will cause  breakdown to worsen. No signs of infection at this time, area with serous drainage due to edema. Strict return precautions discuss- worsening of wound, redness, or pain to LE.   Return in about 1 week (around 04/21/2022) for fluid check., wound check, follow up on diabetes/a1c Cyndie Woodbeck K. [place], [facility] [phone]

## 2022-04-14 NOTE — ED Notes (Signed)
ED provider at bedside. Pulse ox after ambulating 93%

## 2022-04-14 NOTE — Discharge Instructions (Addendum)
Increase your Lasix to 40 mg in the morning and 20 in the evening for 5 days if your PCP agrees during your visit today.

## 2022-04-14 NOTE — ED Notes (Signed)
Ambulated to restroom without assistance 

## 2022-05-03 ENCOUNTER — Ambulatory Visit (INDEPENDENT_AMBULATORY_CARE_PROVIDER_SITE_OTHER): Payer: Medicare Other | Admitting: Nurse Practitioner

## 2022-05-03 ENCOUNTER — Encounter: Payer: Self-pay | Admitting: Nurse Practitioner

## 2022-05-03 VITALS — BP 132/80 | HR 110 | Temp 97.3°F | Ht 68.0 in | Wt 233.0 lb

## 2022-05-03 DIAGNOSIS — I83029 Varicose veins of left lower extremity with ulcer of unspecified site: Secondary | ICD-10-CM | POA: Diagnosis not present

## 2022-05-03 DIAGNOSIS — N1832 Chronic kidney disease, stage 3b: Secondary | ICD-10-CM

## 2022-05-03 DIAGNOSIS — E1122 Type 2 diabetes mellitus with diabetic chronic kidney disease: Secondary | ICD-10-CM

## 2022-05-03 DIAGNOSIS — L97929 Non-pressure chronic ulcer of unspecified part of left lower leg with unspecified severity: Secondary | ICD-10-CM

## 2022-05-03 DIAGNOSIS — I509 Heart failure, unspecified: Secondary | ICD-10-CM | POA: Diagnosis not present

## 2022-05-03 DIAGNOSIS — I4821 Permanent atrial fibrillation: Secondary | ICD-10-CM | POA: Diagnosis not present

## 2022-05-03 DIAGNOSIS — I10 Essential (primary) hypertension: Secondary | ICD-10-CM | POA: Diagnosis not present

## 2022-05-03 NOTE — Progress Notes (Signed)
Careteam: Patient Care Team: Lauree Chandler, NP as PCP - General (Geriatric Medicine) Sueanne Margarita, MD as PCP - Cardiology (Cardiology)  PLACE OF SERVICE:  [place] Directive information    Allergies  Allergen Reactions   Allopurinol Other (See Comments)    Welts in the body and and blisters in the mouth    Chief Complaint  Patient presents with   Follow-up    1 week follow-up on blood pressure and left leg. Patient has all pill bottles present EXCEPT Colchicine and Januvia.      HPI: Patient is a 82 y.o. male here for follow-up visit.   His legs are still very swollen. Wound on left lower leg is bleeding today. He does not feel short of breath anymore and says he is peeing a lot. He says the swelling has improved. He does his dressing changes daily.  He has been taking his medications.   Review of Systems:  Review of Systems  Constitutional:  Negative for chills, fever, malaise/fatigue and weight loss.  HENT:  Negative for congestion and sore throat.   Eyes:  Negative for blurred vision.  Respiratory:  Negative for cough, shortness of breath and wheezing.   Cardiovascular:  Positive for leg swelling. Negative for chest pain, palpitations and orthopnea.  Gastrointestinal:  Negative for abdominal pain, blood in stool, constipation, diarrhea, heartburn, nausea and vomiting.  Genitourinary:  Negative for dysuria, frequency, hematuria and urgency.  Musculoskeletal:  Negative for falls and joint pain.  Skin:  Negative for rash.  Neurological:  Negative for dizziness, tingling and headaches.  Endo/Heme/Allergies:  Negative for polydipsia.  Psychiatric/Behavioral:  Negative for depression. The patient is not nervous/anxious.     Past Medical History:  Diagnosis Date   Cholelithiasis 70/2637   uncomplicated.     Chronic kidney disease, stage II (mild)    Depression    Diabetes mellitus without complication ([place])    Essential hypertension, malignant     Fatty liver 02/2018   on ultrasound   Gout, unspecified    Hyperlipidemia    Lumbago    Obesity, unspecified    Pain in joint, pelvic region and thigh    Pleural effusion on right 02/2018   Past Surgical History:  Procedure Laterality Date   APPENDECTOMY     COLONOSCOPY WITH PROPOFOL N/A 02/22/2018   Procedure: COLONOSCOPY WITH PROPOFOL;  Surgeon: Milus Banister, MD;  Location: [facility] ENDOSCOPY;  Service: Endoscopy;  Laterality: N/A;   CYST EXCISION  1998   back   ESOPHAGOGASTRODUODENOSCOPY (EGD) WITH PROPOFOL N/A 02/22/2018   Procedure: ESOPHAGOGASTRODUODENOSCOPY (EGD) WITH PROPOFOL;  Surgeon: Milus Banister, MD;  Location: [facility] ENDOSCOPY;  Service: Endoscopy;  Laterality: N/A;   POLYPECTOMY  02/22/2018   Procedure: POLYPECTOMY;  Surgeon: Milus Banister, MD;  Location: [facility] ENDOSCOPY;  Service: Endoscopy;;   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 His smokeless tobacco use includes chew. He reports current alcohol use of about 1.0 standard drink of alcohol per week. He reports that he does not use drugs.  Family History  Problem Relation Age of Onset   Heart Problems Father    Heart disease Brother    Diabetes Brother    Diabetes Brother    Heart Problems Mother    Pneumonia Maternal Grandfather     Medications: Patient's Medications  New Prescriptions   No medications on file  Previous Medications  APIXABAN (ELIQUIS) 2.5 MG TABS TABLET    Take 1 tablet (2.5 mg total) by mouth 2 (two) times daily.   ASPIRIN 81 MG EC TABLET    Take 1 tablet (81 mg total) by mouth daily.   COLCHICINE 0.6 MG TABLET    2 tablets now and may repeat 1 tablet in an hour.   DOXAZOSIN (CARDURA) 8 MG TABLET    TAKE 1 TABLET BY MOUTH ONCE  DAILY   FUROSEMIDE (LASIX) 40 MG TABLET    Take 1 tablet (40 mg total) by mouth daily. Needs an appointment before anymore future refills.   JANUVIA 25 MG TABLET    TAKE 1 TABLET BY MOUTH  DAILY   METOPROLOL SUCCINATE (TOPROL-XL) 50 MG 24 HR TABLET    TAKE 1 TABLET BY MOUTH ONCE  DAILY   MULTIPLE VITAMINS-MINERALS (CENTRUM SILVER 50+MEN) TABS    Take 1 tablet by mouth daily.   OLMESARTAN-AMLODIPINE-HCTZ 20-5-12.5 MG TABS    Take 1 tablet by mouth daily.   POTASSIUM CHLORIDE (KLOR-CON M) 10 MEQ TABLET    TAKE 1 TABLET(10 MEQ) BY MOUTH DAILY   ROSUVASTATIN (CRESTOR) 5 MG TABLET    TAKE 1 TABLET BY MOUTH DAILY  Modified Medications   No medications on file  Discontinued Medications   No medications on file    Physical Exam:  Vitals:   05/03/22 1153  BP: 132/80  Pulse: (!) 110  Temp: (!) 97.3 F (36.3 C)  TempSrc: Temporal  SpO2: 92%  Height: '5\' 8"'$  (1.727 m)   Body mass index is 37.86 kg/m. Wt Readings from Last 3 Encounters:  04/13/22 249 lb (112.9 kg)  04/13/22 216 lb 0.8 oz (98 kg)  07/15/21 216 lb (98 kg)    Physical Exam Vitals and nursing note reviewed. Exam conducted with a chaperone present.  Cardiovascular:     Rate and Rhythm: Normal rate. Rhythm irregular.     Heart sounds: Normal heart sounds. No murmur heard. Pulmonary:     Breath sounds: Normal breath sounds. No wheezing or rales.  Abdominal:     General: Bowel sounds are normal.     Palpations: Abdomen is soft. There is no mass.     Tenderness: There is no abdominal tenderness. There is no guarding.  Musculoskeletal:        General: No swelling or tenderness.     Right lower leg: 4+ Pitting Edema present.     Left lower leg: 4+ Pitting Edema present.  Skin:    General: Skin is cool and dry.     Findings: Wound present.          Comments: 4x4cm circular wound with beefy red wound bed, small amount of serous drainage on anterior left tibial area. Surrounding skin is discolored. 2x2cm circular wound on left posterior tibial area with scant serious drainage, more superficial.   Neurological:     Mental Status: He is alert and oriented to person, place, and time. Mental status is at baseline.   Psychiatric:        Mood and Affect: Mood normal.        Behavior: Behavior normal.        Judgment: Judgment normal.     Labs reviewed: Basic Metabolic Panel: Recent Labs    10/12/21 1330 04/13/22 1343  NA 141 141  K 4.4 3.8  CL 101 100  CO2 35* 35*  GLUCOSE 274* 207*  BUN 19 14  CREATININE 1.64* 1.67*  CALCIUM 9.7 9.0  Liver Function Tests: Recent Labs    10/12/21 1330  AST 10  ALT 12  BILITOT 0.9  PROT 6.3   No results for input(s): "LIPASE", "AMYLASE" in the last 8760 hours. No results for input(s): "AMMONIA" in the last 8760 hours. CBC: Recent Labs    10/12/21 1330 04/13/22 1343  WBC 8.7 6.5  NEUTROABS 6,160 4.6  HGB 14.6 13.6  HCT 45.0 42.4  MCV 88.4 91.2  PLT 230 173   Lipid Panel: Recent Labs    10/12/21 1330  CHOL 138  HDL 40  LDLCALC 79  TRIG 106  CHOLHDL 3.5   TSH: No results for input(s): "TSH" in the last 8760 hours. A1C: Lab Results  Component Value Date   HGBA1C 9.8 (H) 10/12/2021     Assessment/Plan 1. Type 2 diabetes mellitus with stage 3b chronic kidney disease, without long-term current use of insulin ([place]) Not on Januvia. Check labs today (left before labs taken at last visit) Encouraged dietary compliance, routine foot care/monitoring and to keep up with diabetic eye exams through ophthalmology  - Hemoglobin A1c -consider farxiga when labs result.  Need for better diabetic control for wound healing.   2. Essential hypertension BP better on olmesartan-amlodipine-hctz. Continue with metoprolol.  - COMPLETE METABOLIC PANEL WITH GFR - CBC with Differential/Platelet  3. Permanent atrial fibrillation (HCC) HR irregular today but rate controlled. Continue metoprolol. With elquis. Follow up labs.   4. Congestive heart failure, unspecified HF chronicity, unspecified heart failure type (HCC) Increase lasix to twice daily for 3 days with potassium then resume lasix and potassium daily.  -encouraged cardiology referral for  further management however pt declines.  - COMPLETE METABOLIC PANEL WITH GFR  5. Venous ulcer of left leg (HCC) Use xeroform with gauze and kerlex. Educated patient on s/s of infection or worsening wound, such as the skin turning red, hot, increasing pain, or increasing swelling, and to call the office should these symptoms occur. He will get more xeroform and compression stockings from the medical supply store. Encouraged patient to elevate legs while in dependent position. - AMB referral to wound care center which he was agreeable to.    Return in about 4 weeks (around 05/31/2022).  Student- Archer Asa O'Berry ACPCNP-S  I personally was present during the history, physical exam and medical decision-making activities of this service and have verified that the service and findings are accurately documented in the student's note [place] Janus K. [place], [facility] [phone]

## 2022-05-03 NOTE — Patient Instructions (Addendum)
Increase lasix to twice daily (also take potassium twice daily) for 3 days then resume lasix and potassium daily.   To start artifical tears as needed for itchy/watery eye.   To call office if left leg starts getting red, warm/hot, painful.  To use xeroform drainage and gauze daily and wrap.

## 2022-05-04 ENCOUNTER — Other Ambulatory Visit: Payer: Self-pay | Admitting: Nurse Practitioner

## 2022-05-04 DIAGNOSIS — N1832 Chronic kidney disease, stage 3b: Secondary | ICD-10-CM

## 2022-05-04 LAB — COMPLETE METABOLIC PANEL WITH GFR
AG Ratio: 1.2 (calc) (ref 1.0–2.5)
ALT: 10 U/L (ref 9–46)
AST: 11 U/L (ref 10–35)
Albumin: 3.6 g/dL (ref 3.6–5.1)
Alkaline phosphatase (APISO): 108 U/L (ref 35–144)
BUN/Creatinine Ratio: 17 (calc) (ref 6–22)
BUN: 30 mg/dL — ABNORMAL HIGH (ref 7–25)
CO2: 39 mmol/L — ABNORMAL HIGH (ref 20–32)
Calcium: 9.4 mg/dL (ref 8.6–10.3)
Chloride: 92 mmol/L — ABNORMAL LOW (ref 98–110)
Creat: 1.8 mg/dL — ABNORMAL HIGH (ref 0.70–1.22)
Globulin: 3.1 g/dL (calc) (ref 1.9–3.7)
Glucose, Bld: 247 mg/dL — ABNORMAL HIGH (ref 65–99)
Potassium: 3.5 mmol/L (ref 3.5–5.3)
Sodium: 140 mmol/L (ref 135–146)
Total Bilirubin: 1.2 mg/dL (ref 0.2–1.2)
Total Protein: 6.7 g/dL (ref 6.1–8.1)
eGFR: 38 mL/min/{1.73_m2} — ABNORMAL LOW (ref >=60)

## 2022-05-04 LAB — CBC WITH DIFFERENTIAL/PLATELET
Absolute Monocytes: 757 cells/uL (ref 200–950)
Basophils Absolute: 52 cells/uL (ref 0–200)
Basophils Relative: 0.6 %
Eosinophils Absolute: 278 cells/uL (ref 15–500)
Eosinophils Relative: 3.2 %
HCT: 42 % (ref 38.5–50.0)
Hemoglobin: 13.8 g/dL (ref 13.2–17.1)
Lymphs Abs: 1235 cells/uL (ref 850–3900)
MCH: 28.8 pg (ref 27.0–33.0)
MCHC: 32.9 g/dL (ref 32.0–36.0)
MCV: 87.7 fL (ref 80.0–100.0)
MPV: 10.1 fL (ref 7.5–12.5)
Monocytes Relative: 8.7 %
Neutro Abs: 6377 cells/uL (ref 1500–7800)
Neutrophils Relative %: 73.3 %
Platelets: 266 10*3/uL (ref 140–400)
RBC: 4.79 10*6/uL (ref 4.20–5.80)
RDW: 12.8 % (ref 11.0–15.0)
Total Lymphocyte: 14.2 %
WBC: 8.7 10*3/uL (ref 3.8–10.8)

## 2022-05-04 LAB — HEMOGLOBIN A1C
Hgb A1c MFr Bld: 10.1 % of total Hgb — ABNORMAL HIGH (ref <5.7)
Mean Plasma Glucose: 243 mg/dL
eAG (mmol/L): 13.5 mmol/L

## 2022-05-04 MED ORDER — SITAGLIPTIN PHOSPHATE 25 MG PO TABS: 25.0000 mg | ORAL_TABLET | Freq: Every day | ORAL | 3 refills | Status: DC

## 2022-05-05 ENCOUNTER — Telehealth: Payer: Self-pay

## 2022-05-05 NOTE — Telephone Encounter (Signed)
Lauree Chandler, NP called to say that she would like for an outgoing call to be placed to patient to recommend home health referral for wound care (wound on left lower leg), since patient has refused appointment with the wound center (see referral).  Spoke with patient and he stated he does not want anyone in his home. Patient states the wound is looking a lot better today and Janett Billow needs to stop worrying.

## 2022-05-10 ENCOUNTER — Other Ambulatory Visit: Payer: Self-pay | Admitting: Nurse Practitioner

## 2022-05-10 DIAGNOSIS — E7800 Pure hypercholesterolemia, unspecified: Secondary | ICD-10-CM

## 2022-05-15 NOTE — Telephone Encounter (Signed)
Noted, thank you

## 2022-06-05 ENCOUNTER — Ambulatory Visit (INDEPENDENT_AMBULATORY_CARE_PROVIDER_SITE_OTHER): Payer: Medicare Other | Admitting: Nurse Practitioner

## 2022-06-05 ENCOUNTER — Encounter: Payer: Self-pay | Admitting: Nurse Practitioner

## 2022-06-05 VITALS — BP 150/90 | HR 83 | Ht 68.0 in | Wt 229.6 lb

## 2022-06-05 DIAGNOSIS — E785 Hyperlipidemia, unspecified: Secondary | ICD-10-CM | POA: Diagnosis not present

## 2022-06-05 DIAGNOSIS — N1832 Chronic kidney disease, stage 3b: Secondary | ICD-10-CM

## 2022-06-05 DIAGNOSIS — I83029 Varicose veins of left lower extremity with ulcer of unspecified site: Secondary | ICD-10-CM

## 2022-06-05 DIAGNOSIS — I509 Heart failure, unspecified: Secondary | ICD-10-CM | POA: Diagnosis not present

## 2022-06-05 DIAGNOSIS — L97929 Non-pressure chronic ulcer of unspecified part of left lower leg with unspecified severity: Secondary | ICD-10-CM

## 2022-06-05 DIAGNOSIS — E1122 Type 2 diabetes mellitus with diabetic chronic kidney disease: Secondary | ICD-10-CM | POA: Diagnosis not present

## 2022-06-05 MED ORDER — DAPAGLIFLOZIN PROPANEDIOL 5 MG PO TABS: 5.0000 mg | ORAL_TABLET | Freq: Every day | ORAL | 1 refills | Status: DC

## 2022-06-05 MED ORDER — BLOOD GLUCOSE METER KIT: PACK | 0 refills | Status: DC

## 2022-06-05 MED ORDER — BLOOD GLUCOSE MONITOR KIT: PACK | 0 refills | Status: DC

## 2022-06-05 NOTE — Patient Instructions (Addendum)
Start FARXIGA 5 MG DAILY for diabetes Continue all other medication as prescribed  Avoid sweets, sweet drinks (any juices, sweet tea, sodas) -diet soda okay

## 2022-06-05 NOTE — Progress Notes (Signed)
Careteam: Patient Care Team: Lauree Chandler, NP as PCP - General (Geriatric Medicine) Sueanne Margarita, MD as PCP - Cardiology (Cardiology)  PLACE OF SERVICE:  [facility]  Advanced Directive information    Allergies  Allergen Reactions  . Allopurinol Other (See Comments)    Welts in the body and and blisters in the mouth    Chief Complaint  Patient presents with  . Medical Management of Chronic Issues    Patient is being seen for  a F/U for DMII (uncontrolled)     HPI: Patient is a 82 y.o. male for follow up on wound and diabetes.   He started [place] but started to get dizzy, changed medication to dinner time and now tolerating better.   He states he is going to have a hard time giving up sugar  He is willing to check blood sugar at home but does not know how.    Review of Systems:  Review of Systems  Constitutional:  Negative for chills, fever and weight loss.  HENT:  Negative for tinnitus.   Respiratory:  Negative for cough, sputum production and shortness of breath.   Cardiovascular:  Negative for chest pain, palpitations and leg swelling.  Gastrointestinal:  Negative for abdominal pain, constipation, diarrhea and heartburn.  Genitourinary:  Negative for dysuria, frequency and urgency.  Musculoskeletal:  Negative for back pain, falls, joint pain and myalgias.  Skin: Negative.   Neurological:  Negative for dizziness and headaches.  Psychiatric/Behavioral:  Negative for depression and memory loss. The patient does not have insomnia.   ***  Past Medical History:  Diagnosis Date  . Cholelithiasis [id number]   uncomplicated.    . Chronic kidney disease, stage II (mild)   . Depression   . Diabetes mellitus without complication ([place])   . Essential hypertension, malignant   . Fatty liver 02/2018   on ultrasound  . Gout, unspecified   . Hyperlipidemia   . Lumbago   . Obesity, unspecified   . Pain in joint, pelvic region and thigh   . Pleural effusion on  right 02/2018   Past Surgical History:  Procedure Laterality Date  . APPENDECTOMY    . COLONOSCOPY WITH PROPOFOL N/A 02/22/2018   Procedure: COLONOSCOPY WITH PROPOFOL;  Surgeon: Milus Banister, MD;  Location: [facility] ENDOSCOPY;  Service: Endoscopy;  Laterality: N/A;  . CYST EXCISION  1998   back  . ESOPHAGOGASTRODUODENOSCOPY (EGD) WITH PROPOFOL N/A 02/22/2018   Procedure: ESOPHAGOGASTRODUODENOSCOPY (EGD) WITH PROPOFOL;  Surgeon: Milus Banister, MD;  Location: [facility] ENDOSCOPY;  Service: Endoscopy;  Laterality: N/A;  . POLYPECTOMY  02/22/2018   Procedure: POLYPECTOMY;  Surgeon: Milus Banister, MD;  Location: [facility] ENDOSCOPY;  Service: Endoscopy;;  . 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 His smokeless tobacco use includes chew. He reports current alcohol use of about 1.0 standard drink of alcohol per week. He reports that he does not use drugs.  Family History  Problem Relation Age of Onset  . Heart Problems Father   . Heart disease Brother   . Diabetes Brother   . Diabetes Brother   . Heart Problems Mother   . Pneumonia Maternal Grandfather     Medications: Patient's Medications  New Prescriptions   No medications on file  Previous Medications   APIXABAN (ELIQUIS) 2.5 MG TABS TABLET    Take 1 tablet (2.5 mg total) by mouth 2 (two) times daily.  ASPIRIN 81 MG EC TABLET    Take 1 tablet (81 mg total) by mouth daily.   COLCHICINE 0.6 MG TABLET    2 tablets now and may repeat 1 tablet in an hour.   DOXAZOSIN (CARDURA) 8 MG TABLET    TAKE 1 TABLET BY MOUTH ONCE  DAILY   FUROSEMIDE (LASIX) 40 MG TABLET    Take 1 tablet (40 mg total) by mouth daily. Needs an appointment before anymore future refills.   METOPROLOL SUCCINATE (TOPROL-XL) 50 MG 24 HR TABLET    TAKE 1 TABLET BY MOUTH ONCE  DAILY   MULTIPLE VITAMINS-MINERALS (CENTRUM SILVER 50+MEN) TABS    Take 1 tablet by mouth daily.   OLMESARTAN-AMLODIPINE-HCTZ 20-5-12.5  MG TABS    Take 1 tablet by mouth daily.   POTASSIUM CHLORIDE (KLOR-CON M) 10 MEQ TABLET    TAKE 1 TABLET(10 MEQ) BY MOUTH DAILY   ROSUVASTATIN (CRESTOR) 5 MG TABLET    TAKE 1 TABLET BY MOUTH DAILY   SITAGLIPTIN (JANUVIA) 25 MG TABLET    Take 1 tablet (25 mg total) by mouth daily.  Modified Medications   No medications on file  Discontinued Medications   No medications on file    Physical Exam:  Vitals:   06/05/22 1248 06/05/22 1319  BP: (!) 154/98 (!) 150/90  Pulse: 83   SpO2: 96%   Weight: 229 lb 9.6 oz (104.1 kg)   Height: 5' 8"$  (1.727 m)    Body mass index is 34.91 kg/m. Wt Readings from Last 3 Encounters:  06/05/22 229 lb 9.6 oz (104.1 kg)  05/03/22 233 lb (105.7 kg)  04/13/22 249 lb (112.9 kg)    Physical Exam Constitutional:      General: He is not in acute distress.    Appearance: He is well-developed. He is not diaphoretic.  HENT:     Head: Normocephalic and atraumatic.     Right Ear: External ear normal.     Left Ear: External ear normal.     Mouth/Throat:     Pharynx: No oropharyngeal exudate.  Eyes:     Conjunctiva/sclera: Conjunctivae normal.     Pupils: Pupils are equal, round, and reactive to light.  Cardiovascular:     Rate and Rhythm: Normal rate and regular rhythm.     Heart sounds: Normal heart sounds.  Pulmonary:     Effort: Pulmonary effort is normal.     Breath sounds: Normal breath sounds.  Abdominal:     General: Bowel sounds are normal.     Palpations: Abdomen is soft.  Musculoskeletal:        General: No tenderness.     Cervical back: Normal range of motion and neck supple.     Right lower leg: Edema (1+) present.     Left lower leg: Edema (1+) present.  Skin:    General: Skin is warm and dry.  Neurological:     Mental Status: He is alert and oriented to person, place, and time.  ***   Labs reviewed: Basic Metabolic Panel: Recent Labs    10/12/21 1330 04/13/22 1343 05/03/22 1504  NA 141 141 140  K 4.4 3.8 3.5  CL 101 100  92*  CO2 35* 35* 39*  GLUCOSE 274* 207* 247*  BUN 19 14 30*  CREATININE 1.64* 1.67* 1.80*  CALCIUM 9.7 9.0 9.4   Liver Function Tests: Recent Labs    10/12/21 1330 05/03/22 1504  AST 10 11  ALT 12 10  BILITOT 0.9 1.2  PROT 6.3 6.7   No results for input(s): "LIPASE", "AMYLASE" in the last 8760 hours. No results for input(s): "AMMONIA" in the last 8760 hours. CBC: Recent Labs    10/12/21 1330 04/13/22 1343 05/03/22 1504  WBC 8.7 6.5 8.7  NEUTROABS 6,160 4.6 6,377  HGB 14.6 13.6 13.8  HCT 45.0 42.4 42.0  MCV 88.4 91.2 87.7  PLT 230 173 266   Lipid Panel: Recent Labs    10/12/21 1330  CHOL 138  HDL 40  LDLCALC 79  TRIG 106  CHOLHDL 3.5   TSH: No results for input(s): "TSH" in the last 8760 hours. A1C: Lab Results  Component Value Date   HGBA1C 10.1 (H) 05/03/2022     Assessment/Plan 1. Type 2 diabetes mellitus with stage 3b chronic kidney disease, without long-term current use of insulin (HCC) *** - dapagliflozin propanediol (FARXIGA) 5 MG TABS tablet; Take 1 tablet (5 mg total) by mouth daily before breakfast.  Dispense: 90 tablet; Refill: 1 - CBC with Differential/Platelet; Future - Hemoglobin A1c; Future  2. Stage 3b chronic kidney disease (HCC) *** - dapagliflozin propanediol (FARXIGA) 5 MG TABS tablet; Take 1 tablet (5 mg total) by mouth daily before breakfast.  Dispense: 90 tablet; Refill: 1 - CBC with Differential/Platelet; Future - Hemoglobin A1c; Future  3. Congestive heart failure, unspecified HF chronicity, unspecified heart failure type (HCC) *** - dapagliflozin propanediol (FARXIGA) 5 MG TABS tablet; Take 1 tablet (5 mg total) by mouth daily before breakfast.  Dispense: 90 tablet; Refill: 1  4. Venous ulcer of left leg (HCC) ***  5. Hyperlipidemia, unspecified hyperlipidemia type *** - Lipid panel; Future - COMPLETE METABOLIC PANEL WITH GFR; Future   No follow-ups on file.: ***  Emoni Whitworth K. [place], [place]  [facility] [phone]

## 2022-06-20 ENCOUNTER — Telehealth: Payer: Self-pay

## 2022-06-20 NOTE — Patient Outreach (Signed)
  Care Coordination   06/20/2022 Name: Douglas Soto MRN: [id number] DOB: Nov 19, 1940   Care Coordination Outreach Attempts:  An unsuccessful telephone outreach was attempted today to offer the patient information about available care coordination services as a benefit of their health plan.   Follow Up Plan:  Additional outreach attempts will be made to offer the patient care coordination information and services.   Encounter Outcome:  No Answer   Care Coordination Interventions:  No, not indicated    Daneen Schick, BSW, CDP Social Worker, Certified Dementia Practitioner [place] [facility]  Care Coordination [phone]

## 2022-06-23 ENCOUNTER — Ambulatory Visit: Payer: Self-pay

## 2022-06-23 ENCOUNTER — Other Ambulatory Visit: Payer: Self-pay | Admitting: Nurse Practitioner

## 2022-06-23 DIAGNOSIS — I509 Heart failure, unspecified: Secondary | ICD-10-CM

## 2022-06-23 DIAGNOSIS — E1122 Type 2 diabetes mellitus with diabetic chronic kidney disease: Secondary | ICD-10-CM

## 2022-06-23 DIAGNOSIS — N1832 Chronic kidney disease, stage 3b: Secondary | ICD-10-CM

## 2022-06-23 MED ORDER — EMPAGLIFLOZIN 10 MG PO TABS: 10.0000 mg | ORAL_TABLET | Freq: Every day | ORAL | 1 refills | Status: DC

## 2022-06-23 NOTE — Patient Outreach (Signed)
  Care Coordination   Initial Visit Note   06/23/2022 Name: Douglas Soto MRN: [id number] DOB: 04/19/1940  Douglas Soto is a 82 y.o. year old male who sees Eubanks, Carlos American, NP for primary care. I spoke with  Douglas Soto by phone today.  What matters to the patients health and wellness today?  Assistance with medication costs    Goals Addressed             This Visit's Progress    COMPLETED: Care Coordination Activities       Care Coordination Interventions: SDoH screening performed - no acute resource needs identified at this time Discussed patient was recently prescribed Wilder Glade last month which had a very high copay. Patient reports it costs $100 for 1 month supply, over $300 for 3 month supply Education provided on role of pharmacy team to assist with patient assistance applications. Patient agreeable to work with pharmacist; referral placed Discussed role of care coordination team - patient declines further engagement at this time Encouraged the patient to contact his primary care provider as needed         SDOH assessments and interventions completed:  No  SDOH Interventions Today    Flowsheet Row Most Recent Value  SDOH Interventions   Food Insecurity Interventions Intervention Not Indicated  Housing Interventions Intervention Not Indicated  Transportation Interventions Intervention Not Indicated  Utilities Interventions Intervention Not Indicated        Care Coordination Interventions:  Yes, provided  . Interventions Today    Flowsheet Row Most Recent Value  Chronic Disease   Chronic disease during today's visit Diabetes  General Interventions   General Interventions Discussed/Reviewed General Interventions Discussed  Pharmacy Interventions   Pharmacy Dicussed/Reviewed Referral to Pharmacist  Referral to Pharmacist Cannot afford medications        Follow up plan: Referral made to pharmacy team    Encounter Outcome:  Pt. Visit Completed    Daneen Schick, Arita Miss, CDP Social Worker, Certified Dementia Practitioner [facility] Care Management  Care Coordination [phone]

## 2022-06-23 NOTE — Patient Instructions (Signed)
Visit Information  Thank you for taking time to visit with me today. Please don't hesitate to contact me if I can be of assistance to you.   Following are the goals we discussed today:   Goals Addressed             This Visit's Progress    COMPLETED: Care Coordination Activities       Care Coordination Interventions: SDoH screening performed - no acute resource needs identified at this time Discussed patient was recently prescribed Wilder Glade last month which had a very high copay. Patient reports it costs $100 for 1 month supply, over $300 for 3 month supply Education provided on role of pharmacy team to assist with patient assistance applications. Patient agreeable to work with pharmacist; referral placed Discussed role of care coordination team - patient declines further engagement at this time Encouraged the patient to contact his primary care provider as needed         If you are experiencing a Mental Health or [place] or need someone to talk to, please call 911  The patient verbalized understanding of instructions, educational materials, and care plan provided today and DECLINED offer to receive copy of patient instructions, educational materials, and care plan.   [facility] team will follow up with the patient and will provide direct communication to the PCP for this patient.   Daneen Schick, BSW, CDP Social Worker, Certified Dementia Practitioner [place] [facility]  Care Coordination [phone]

## 2022-06-23 NOTE — Progress Notes (Signed)
Drug coverage denial sent for farxiga Will send new Rx for jardiance at this time and have him stop farxiga

## 2022-06-26 ENCOUNTER — Telehealth: Payer: Self-pay | Admitting: Pharmacist

## 2022-06-26 NOTE — Progress Notes (Signed)
[facility])  [place] Team   06/26/2022  Douglas Soto 19-Aug-1940 [id number]  Reason for referral: Medication assistance with Januvia and Jardiance   Referral source: [facility] Social Worker Current insurance: [employer]  Outreach:  Successful telephone call with Douglas Soto.  HIPAA identifiers verified.   Medication Assistance Findings:  Medication assistance needs identified: Januvia and Jardiance  Extra Help:  Not eligible for Extra Help Low Income Subsidy based on reported income and assets  Additional medication assistance options reviewed with patient as warranted:  Insurance OTC catalogue  Plan: I will route patient assistance letter to [place] technician who will coordinate patient assistance program application process for medications listed above.  [facility] pharmacy technician will assist with obtaining all required documents from both patient and provider(s) and submit application(s) once completed.  Thank you for allowing [facility] pharmacy to be a part of this patient's care.    Loretha Brasil, PharmD [place] Clinical Pharmacist Direct Dial: [phone]

## 2022-06-28 ENCOUNTER — Telehealth: Payer: Self-pay | Admitting: Pharmacy Technician

## 2022-06-28 DIAGNOSIS — Z596 Low income: Secondary | ICD-10-CM

## 2022-06-28 NOTE — Progress Notes (Signed)
[facility])                                            [place] Team    06/28/2022  Douglas Soto April 01, 1941 [id number]                                      Medication Assistance Referral  Referral From: [place]  Medication/Company: Celesta Gentile / [employer] Patient application portion:  Education officer, museum portion:  Mailed to Sherrie Mustache, NP Provider address/fax verified via: Office website  Medication/Company: Vania Rea / [employer] Patient application portion:  Mailed Provider application portion: Faxed  to [employer] address/fax verified via: [employer]. Sheral Pfahler, [place]  [phone]

## 2022-07-13 ENCOUNTER — Telehealth: Payer: Self-pay | Admitting: Pharmacy Technician

## 2022-07-13 DIAGNOSIS — Z596 Low income: Secondary | ICD-10-CM

## 2022-07-13 NOTE — Progress Notes (Signed)
[facility])                                            [place] Team    07/13/2022  JUDDSON MASCARENAS 1940/12/05 [id number]  Received both patient and provider portion(s) of patient assistance application(s) for Jardiance and Januvia. Faxed completed application and required documents into BI and Mailed completed application & required documents into [employer].Sharee Pimple P. Shital Crayton, [place]  [phone]

## 2022-07-22 ENCOUNTER — Other Ambulatory Visit: Payer: Self-pay | Admitting: Nurse Practitioner

## 2022-07-22 DIAGNOSIS — E7800 Pure hypercholesterolemia, unspecified: Secondary | ICD-10-CM

## 2022-07-26 ENCOUNTER — Telehealth: Payer: Self-pay

## 2022-07-26 DIAGNOSIS — I509 Heart failure, unspecified: Secondary | ICD-10-CM

## 2022-07-26 DIAGNOSIS — N1832 Chronic kidney disease, stage 3b: Secondary | ICD-10-CM

## 2022-07-26 DIAGNOSIS — E1122 Type 2 diabetes mellitus with diabetic chronic kidney disease: Secondary | ICD-10-CM

## 2022-07-26 NOTE — Telephone Encounter (Signed)
Patient called to say that he has stopped jardiance and flushed 300 dollars down the toilet. Patient states medication had him itching all over and caused severe joint pain. Patient stated " I can not take that medication."   Medication was removed from active medication list and added to allergy list   Please advise

## 2022-07-28 NOTE — Telephone Encounter (Signed)
Is he taking the [place]?

## 2022-07-28 NOTE — Telephone Encounter (Signed)
Left message on voicemail for patient to return call when available   

## 2022-07-31 ENCOUNTER — Other Ambulatory Visit: Payer: Self-pay | Admitting: Nurse Practitioner

## 2022-07-31 DIAGNOSIS — I509 Heart failure, unspecified: Secondary | ICD-10-CM

## 2022-07-31 NOTE — Telephone Encounter (Signed)
Patient returned call and he is taking Januvia. He just started back taking it because he had stopped taking it when he stated itching.

## 2022-07-31 NOTE — Telephone Encounter (Signed)
Will get pharmacy team involve to help manage diabetes since he is having such a time with medication.

## 2022-08-01 ENCOUNTER — Telehealth: Payer: Self-pay | Admitting: Pharmacy Technician

## 2022-08-01 DIAGNOSIS — Z5986 Financial insecurity: Secondary | ICD-10-CM

## 2022-08-01 NOTE — Progress Notes (Addendum)
[facility])                                            [facility] Quality Pharmacy Team    08/01/2022  Douglas Soto [birth year] [id number]  Care coordination call placed to [facility] in regard to [facility] application.  Spoke to Raven who informs patient is APPROVED 08/01/22-04/17/23. Initial shipment of medication will process automatically and ship to the patient's home. Subsequent refills will have to be re ordered by the patient (when patient has 14 days supply remaining) by calling [employer] at [phone].   ADDENDUM  Successful outreach to patient. Patient developed itching to [facility] and has stopped the medication. Provider is aware per EPIC encounter. Patient was provided phone number to call BI and try to stop any shipments.  Bessie Boyte P. Tiberius Loftus, CPhT [facility] [employer]  [phone]

## 2022-08-03 ENCOUNTER — Other Ambulatory Visit: Payer: Medicare Other | Admitting: Pharmacist

## 2022-08-03 NOTE — Progress Notes (Signed)
08/03/2022 Name: Douglas Soto MRN: [id number] DOB: [birth year]  Chief Complaint  Patient presents with   Diabetes   Medication Assistance    Douglas Soto is a 82 y.o. year old male who presented for a telephone visit.   They were referred to the pharmacist by their PCP for assistance in managing diabetes and medication access.    Subjective:  Care Team: Primary Care Provider: Sharon Seller, NP ; Next Scheduled Visit: 08/28/22   Medication Access/Adherence  Current Pharmacy:  [facility] [phone] - [place], [place] - [address] AT [facility] [address] [place] [address] Phone: [phone] Fax: [phone]  [facility] - [place], [place] - [address] [place]  [address] Phone: [phone] Fax: [phone]  [id number] - [place], [place] - [address] AT [facility] OF [address] [place] [address] Phone: [phone] Fax: [phone]   Patient reports affordability concerns with their medications: Yes , Douglas Soto, eliquis Patient reports access/transportation concerns to their pharmacy:  assess at future visit Patient reports adherence concerns with their medications:  assess at future visit   Diabetes:  Current medications: januvia  daily. "Just started this medicine" Medications tried in the past:  - metformin in distant past, "made him feel like crap, his whole body" but did not report specific side effects - jardiance recently: stopped due to rash, itching, hives  Current glucose readings: not currently checking and not interested at this time  Patient denies hypoglycemic s/sx including dizziness, shakiness, sweating. Patient denies hyperglycemic symptoms including polyuria, polydipsia, polyphagia, nocturia, neuropathy, blurred vision.  Current meal patterns:  - Breakfast:  scrambled eggs & cheese, occasionally chex cereal + milk with fruit - Lunch: chicken sandwich on whole wheat bread - Supper: broccoli, peas. (Cornbread occasionally, does not eat much meat at dinner) - Snacks: bananas maybe, does not  - Drinks: water, and zero sugar [place] dry  Patient reports he lost 49lb over 6 months. 249 to now 199lb   Current physical activity: limited by heart failure and "runs out of steam"  Current medication access support: none at present, was set up with jardiance PAP but discontinued after side effects   Objective:  Lab Results  Component Value Date   HGBA1C 10.1 (H) 05/03/2022    Lab Results  Component Value Date   CREATININE 1.80 (H) 05/03/2022   BUN 30 (H) 05/03/2022   NA 140 05/03/2022   K 3.5 05/03/2022   CL 92 (L) 05/03/2022   CO2 39 (H) 05/03/2022    Lab Results  Component Value Date   CHOL 138 10/12/2021   HDL 40 10/12/2021   LDLCALC 79 10/12/2021   TRIG 106 10/12/2021   CHOLHDL 3.5 10/12/2021    Medications Reviewed Today     Reviewed by Gabriel Carina, RPH (Pharmacist) on 08/03/22 at [phone]  Med List Status: <None>   Medication Order Taking? Sig Documenting Provider Last Dose Status Informant  apixaban (ELIQUIS) 2.5 MG TABS tablet [id number]  Take 1 tablet (2.5 mg total) by mouth 2 (two) times daily. Sharon Seller, NP  Active   aspirin 81 MG EC tablet [id number]  Take 1 tablet (81 mg total) by mouth daily. Dimple Nanas, MD  Active   blood glucose meter kit and supplies [id number]  Dispense based on patient and insurance preference. Use up to four times daily  as directed. (FOR ICD-10 E10.9, E11.9). Sharon Seller, NP  Active   colchicine 0.6 MG tablet [id number]  2 tablets now and may repeat 1 tablet in an hour. Sharon Seller, NP  Active   doxazosin (CARDURA) 8 MG tablet [id number]  TAKE 1 TABLET BY MOUTH ONCE  DAILY Sharon Seller, NP  Active   furosemide (LASIX) 40 MG tablet [id number]  TAKE 1 TABLET(40 MG) BY  MOUTH DAILY Sharon Seller, NP  Active   metoprolol succinate (TOPROL-XL) 50 MG 24 hr tablet [id number]  TAKE 1 TABLET BY MOUTH ONCE  DAILY Sharon Seller, NP  Active   Multiple Vitamins-Minerals (CENTRUM SILVER 50+MEN) TABS [id number]  Take 1 tablet by mouth daily. [provider]  Active Self  Olmesartan-amLODIPine-HCTZ 20-5-12.5 MG TABS [id number]  Take 1 tablet by mouth daily. Sharon Seller, NP  Active   potassium chloride (KLOR-CON M) 10 MEQ tablet [id number]  TAKE 1 TABLET(10 MEQ) BY MOUTH DAILY Sharon Seller, NP  Active   rosuvastatin (CRESTOR) 5 MG tablet [id number]  TAKE 1 TABLET BY MOUTH DAILY Sharon Seller, NP  Active   sitaGLIPtin (JANUVIA) 25 MG tablet [id number] Yes Take 1 tablet (25 mg total) by mouth daily. Sharon Seller, NP Taking Active              Assessment/Plan:   Diabetes: - Currently uncontrolled - Reviewed long term cardiovascular and renal outcomes of uncontrolled blood sugar - Reviewed goal A1c, goal fasting, and goal 2 hour post prandial glucose - Reviewed dietary modifications including lean meats/nuts for proteins, greens, and doing carbs/sweets in moderation - Recommend to continue current regimen as he is resistant to more changes at this time. Would be an ideal candidate for the following optimization: 1) GLP1 agonist - ozempic weekly (using PAP for cost burden) for CV risk reduction, weight management, & most optimal A1c lowering 2) AFTER implementation of GLP1 agonist - likely consider farxiga (using PAP for cost burden) for HF and renal benefit. Completed drug review, farxiga is not associated with rash/itching that patient experienced with jardiance.  - Recommend to check glucose in future if amenable, patient wants to discuss with PCP before implementing any glucose checks whether traditional or CGM - (of note, patient does not qualify for free cost of continuous glucose monitoring, could arrange sample if patient  desires) - Likely meets financial criteria for farxiga, ozempic patient assistance program through [employer], novonordisk. Will collaborate in future with provider, CPhT, and patient to pursue assistance if patient is amenable.     Follow Up Plan: 2-3 weeks after collaboration with PCP and giving patient time to digest information/make decisions  Lynnda Shields, PharmD, BCPS Clinical Pharmacist [facility]

## 2022-08-21 ENCOUNTER — Other Ambulatory Visit: Payer: Self-pay | Admitting: Nurse Practitioner

## 2022-08-21 DIAGNOSIS — I10 Essential (primary) hypertension: Secondary | ICD-10-CM

## 2022-08-22 ENCOUNTER — Other Ambulatory Visit: Payer: Self-pay

## 2022-08-22 DIAGNOSIS — I10 Essential (primary) hypertension: Secondary | ICD-10-CM

## 2022-08-22 MED ORDER — OLMESARTAN-AMLODIPINE-HCTZ 20-5-12.5 MG PO TABS: 1.0000 | ORAL_TABLET | Freq: Every day | ORAL | 2 refills | Status: DC

## 2022-08-22 NOTE — Telephone Encounter (Signed)
Patient called requesting refill of olmesartan be sent to [facility] because [facility] doesn't have it.  Medication sent to [facility]

## 2022-08-25 ENCOUNTER — Other Ambulatory Visit: Payer: Self-pay

## 2022-08-25 DIAGNOSIS — I4821 Permanent atrial fibrillation: Secondary | ICD-10-CM

## 2022-08-25 DIAGNOSIS — I509 Heart failure, unspecified: Secondary | ICD-10-CM

## 2022-08-25 DIAGNOSIS — N1832 Chronic kidney disease, stage 3b: Secondary | ICD-10-CM

## 2022-08-25 MED ORDER — SITAGLIPTIN PHOSPHATE 25 MG PO TABS
25.0000 mg | ORAL_TABLET | Freq: Every day | ORAL | 1 refills | Status: DC
Start: 2022-08-25 — End: 2022-08-30

## 2022-08-25 MED ORDER — APIXABAN 2.5 MG PO TABS: 2.5000 mg | ORAL_TABLET | Freq: Two times a day (BID) | ORAL | 1 refills | Status: DC

## 2022-08-25 MED ORDER — FUROSEMIDE 40 MG PO TABS: ORAL_TABLET | ORAL | 1 refills | Status: DC

## 2022-08-25 NOTE — Telephone Encounter (Signed)
Patient request that all three medications be sent to [facility]. Patient medications have High Risk Warnings. Please approve.

## 2022-08-28 ENCOUNTER — Other Ambulatory Visit: Payer: Medicare Other

## 2022-08-28 DIAGNOSIS — N1832 Chronic kidney disease, stage 3b: Secondary | ICD-10-CM

## 2022-08-28 DIAGNOSIS — E1122 Type 2 diabetes mellitus with diabetic chronic kidney disease: Secondary | ICD-10-CM | POA: Diagnosis not present

## 2022-08-28 DIAGNOSIS — E785 Hyperlipidemia, unspecified: Secondary | ICD-10-CM | POA: Diagnosis not present

## 2022-08-29 LAB — COMPLETE METABOLIC PANEL WITH GFR
AG Ratio: 1.3 (calc) (ref 1.0–2.5)
ALT: 12 U/L (ref 9–46)
AST: 13 U/L (ref 10–35)
Albumin: 3.5 g/dL — ABNORMAL LOW (ref 3.6–5.1)
Alkaline phosphatase (APISO): 84 U/L (ref 35–144)
BUN/Creatinine Ratio: 22 (calc) (ref 6–22)
BUN: 51 mg/dL — ABNORMAL HIGH (ref 7–25)
CO2: 34 mmol/L — ABNORMAL HIGH (ref 20–32)
Calcium: 9.2 mg/dL (ref 8.6–10.3)
Chloride: 99 mmol/L (ref 98–110)
Creat: 2.3 mg/dL — ABNORMAL HIGH (ref 0.70–1.22)
Globulin: 2.8 g/dL (calc) (ref 1.9–3.7)
Glucose, Bld: 133 mg/dL (ref 65–139)
Potassium: 3.8 mmol/L (ref 3.5–5.3)
Sodium: 141 mmol/L (ref 135–146)
Total Bilirubin: 0.9 mg/dL (ref 0.2–1.2)
Total Protein: 6.3 g/dL (ref 6.1–8.1)
eGFR: 28 mL/min/{1.73_m2} — ABNORMAL LOW (ref >=60)

## 2022-08-29 LAB — CBC WITH DIFFERENTIAL/PLATELET
Absolute Monocytes: 707 cells/uL (ref 200–950)
Basophils Absolute: 38 cells/uL (ref 0–200)
Basophils Relative: 0.5 %
Eosinophils Absolute: 319 cells/uL (ref 15–500)
Eosinophils Relative: 4.2 %
HCT: 38.7 % (ref 38.5–50.0)
Hemoglobin: 12.8 g/dL — ABNORMAL LOW (ref 13.2–17.1)
Lymphs Abs: 1824 cells/uL (ref 850–3900)
MCH: 29.4 pg (ref 27.0–33.0)
MCHC: 33.1 g/dL (ref 32.0–36.0)
MCV: 89 fL (ref 80.0–100.0)
MPV: 9.5 fL (ref 7.5–12.5)
Monocytes Relative: 9.3 %
Neutro Abs: 4712 cells/uL (ref 1500–7800)
Neutrophils Relative %: 62 %
Platelets: 228 10*3/uL (ref 140–400)
RBC: 4.35 10*6/uL (ref 4.20–5.80)
RDW: 13.9 % (ref 11.0–15.0)
Total Lymphocyte: 24 %
WBC: 7.6 10*3/uL (ref 3.8–10.8)

## 2022-08-29 LAB — HEMOGLOBIN A1C
Hgb A1c MFr Bld: 6.9 % of total Hgb — ABNORMAL HIGH (ref <5.7)
Mean Plasma Glucose: 151 mg/dL
eAG (mmol/L): 8.4 mmol/L

## 2022-08-29 LAB — LIPID PANEL
Cholesterol: 112 mg/dL (ref <200)
HDL: 52 mg/dL (ref >=40)
LDL Cholesterol (Calc): 46 mg/dL (calc)
Non-HDL Cholesterol (Calc): 60 mg/dL (calc) (ref <130)
Total CHOL/HDL Ratio: 2.2 (calc) (ref <5.0)
Triglycerides: 62 mg/dL (ref <150)

## 2022-08-30 ENCOUNTER — Telehealth: Payer: Self-pay

## 2022-08-30 ENCOUNTER — Encounter: Payer: Self-pay | Admitting: Nurse Practitioner

## 2022-08-30 ENCOUNTER — Ambulatory Visit (INDEPENDENT_AMBULATORY_CARE_PROVIDER_SITE_OTHER): Payer: Medicare Other | Admitting: Nurse Practitioner

## 2022-08-30 VITALS — BP 140/80 | HR 71 | Temp 97.7°F | Resp 17 | Ht 68.0 in | Wt 203.2 lb

## 2022-08-30 DIAGNOSIS — I83029 Varicose veins of left lower extremity with ulcer of unspecified site: Secondary | ICD-10-CM | POA: Diagnosis not present

## 2022-08-30 DIAGNOSIS — E6609 Other obesity due to excess calories: Secondary | ICD-10-CM

## 2022-08-30 DIAGNOSIS — L97929 Non-pressure chronic ulcer of unspecified part of left lower leg with unspecified severity: Secondary | ICD-10-CM | POA: Diagnosis not present

## 2022-08-30 DIAGNOSIS — I10 Essential (primary) hypertension: Secondary | ICD-10-CM

## 2022-08-30 DIAGNOSIS — Z6830 Body mass index (BMI) 30.0-30.9, adult: Secondary | ICD-10-CM

## 2022-08-30 DIAGNOSIS — Z7189 Other specified counseling: Secondary | ICD-10-CM | POA: Diagnosis not present

## 2022-08-30 DIAGNOSIS — M1A9XX0 Chronic gout, unspecified, without tophus (tophi): Secondary | ICD-10-CM

## 2022-08-30 DIAGNOSIS — E7800 Pure hypercholesterolemia, unspecified: Secondary | ICD-10-CM | POA: Diagnosis not present

## 2022-08-30 DIAGNOSIS — E1122 Type 2 diabetes mellitus with diabetic chronic kidney disease: Secondary | ICD-10-CM | POA: Diagnosis not present

## 2022-08-30 DIAGNOSIS — I509 Heart failure, unspecified: Secondary | ICD-10-CM

## 2022-08-30 DIAGNOSIS — I4821 Permanent atrial fibrillation: Secondary | ICD-10-CM | POA: Diagnosis not present

## 2022-08-30 DIAGNOSIS — N1832 Chronic kidney disease, stage 3b: Secondary | ICD-10-CM

## 2022-08-30 MED ORDER — ROSUVASTATIN CALCIUM 5 MG PO TABS
5.0000 mg | ORAL_TABLET | Freq: Every day | ORAL | 1 refills | Status: DC
Start: 2022-08-30 — End: 2022-09-05

## 2022-08-30 MED ORDER — COLCHICINE 0.6 MG PO TABS: ORAL_TABLET | ORAL | 0 refills | Status: DC

## 2022-08-30 MED ORDER — APIXABAN 2.5 MG PO TABS
2.5000 mg | ORAL_TABLET | Freq: Two times a day (BID) | ORAL | 1 refills | Status: DC
Start: 2022-08-30 — End: 2022-09-05

## 2022-08-30 MED ORDER — OLMESARTAN-AMLODIPINE-HCTZ 20-5-12.5 MG PO TABS: 1.0000 | ORAL_TABLET | Freq: Every day | ORAL | 2 refills | Status: DC

## 2022-08-30 MED ORDER — FUROSEMIDE 40 MG PO TABS
ORAL_TABLET | ORAL | 1 refills | Status: DC
Start: 2022-08-30 — End: 2022-09-05

## 2022-08-30 MED ORDER — METOPROLOL SUCCINATE ER 50 MG PO TB24
50.0000 mg | ORAL_TABLET | Freq: Every day | ORAL | 1 refills | Status: DC
Start: 2022-08-30 — End: 2022-09-05

## 2022-08-30 MED ORDER — SITAGLIPTIN PHOSPHATE 25 MG PO TABS
25.0000 mg | ORAL_TABLET | Freq: Every day | ORAL | 1 refills | Status: DC
Start: 2022-08-30 — End: 2022-09-05

## 2022-08-30 MED ORDER — POTASSIUM CHLORIDE CRYS ER 10 MEQ PO TBCR
EXTENDED_RELEASE_TABLET | ORAL | 1 refills | Status: DC
Start: 2022-08-30 — End: 2022-09-05

## 2022-08-30 MED ORDER — DOXAZOSIN MESYLATE 8 MG PO TABS
ORAL_TABLET | ORAL | 1 refills | Status: DC
Start: 2022-08-30 — End: 2022-09-05

## 2022-08-30 NOTE — Telephone Encounter (Signed)
Patient was here for a follow-up with Shanda Bumps and refused to schedule a 4 week lab follow-up as indicated on AVS. Shanda Bumps was present when patient refused.

## 2022-08-30 NOTE — Progress Notes (Signed)
Careteam: Patient Care Team: Sharon Seller, NP as PCP - General (Geriatric Medicine) Quintella Reichert, MD as PCP - Cardiology (Cardiology)  PLACE OF SERVICE:  [facility]  Advanced Directive information Does Patient Have a Medical Advance Directive?: No (Patient was extremly hostile when asked about advanced directive. Stating " Thats none of your business", "You dont need to know that", "There too much in that D** comupter".), Would patient like information on creating a medical advance directive?: No - Patient declined  Allergies  Allergen Reactions   Allopurinol Other (See Comments)    Welts in the body and and blisters in the mouth   Jardiance [Empagliflozin] Itching    Itching and joint pain     Chief Complaint  Patient presents with   Medical Management of Chronic Issues    3 month follow-up, discuss labs (copy printed and given to patient), and foot exam. Discuss need for eye exam, diabetic kidney evaluation, shingrix, pneumonia, annual wellness visit.      HPI: Patient is a 82 y.o. male for routine follow up.  His insurance does not want to accept checks and wants a card on file.  They did not send him his blood pressure medication in 5 days.  He got a recording when he tried to called and got recording.   DM- A1c at goal, he is trying to eat better- generally does not eat like he should.  He has been taking janvuia 25 mg by mouth daily -- working with medication assistance program and mailed off paperwork today.  No side effects from medication.   Only taking eliquis once daily- afraid he will bleed to death if he takes it twice daily   CKD- does not want to see a nephrologist.  He is not drinking as much water as he used to.   He does not want to go to any more offices or to see any more providers- feels like 1 is too much.   Legs looking much better. No swelling,   Review of Systems:  Review of Systems  Constitutional:  Negative for chills, fever and  weight loss.  HENT:  Negative for tinnitus.   Respiratory:  Negative for cough, sputum production and shortness of breath.   Cardiovascular:  Negative for chest pain, palpitations and leg swelling.  Gastrointestinal:  Negative for abdominal pain, constipation, diarrhea and heartburn.  Genitourinary:  Negative for dysuria, frequency and urgency.  Musculoskeletal:  Negative for back pain, falls, joint pain and myalgias.  Skin: Negative.   Neurological:  Negative for dizziness and headaches.  Psychiatric/Behavioral:  Negative for depression and memory loss. The patient does not have insomnia.     Past Medical History:  Diagnosis Date   Cholelithiasis 02/2018   uncomplicated.     Chronic kidney disease, stage II (mild)    Depression    Diabetes mellitus without complication (HCC)    Essential hypertension, malignant    Fatty liver 02/2018   on ultrasound   Gout, unspecified    Hyperlipidemia    Lumbago    Obesity, unspecified    Pain in joint, pelvic region and thigh    Pleural effusion on right 02/2018   Past Surgical History:  Procedure Laterality Date   APPENDECTOMY     COLONOSCOPY WITH PROPOFOL N/A 02/22/2018   Procedure: COLONOSCOPY WITH PROPOFOL;  Surgeon: Rachael Fee, MD;  Location: [facility] ENDOSCOPY;  Service: Endoscopy;  Laterality: N/A;   CYST EXCISION  1998   back  ESOPHAGOGASTRODUODENOSCOPY (EGD) WITH PROPOFOL N/A 02/22/2018   Procedure: ESOPHAGOGASTRODUODENOSCOPY (EGD) WITH PROPOFOL;  Surgeon: Rachael Fee, MD;  Location: [facility] ENDOSCOPY;  Service: Endoscopy;  Laterality: N/A;   POLYPECTOMY  02/22/2018   Procedure: POLYPECTOMY;  Surgeon: Rachael Fee, MD;  Location: [facility] ENDOSCOPY;  Service: Endoscopy;;   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 His smokeless tobacco use includes chew. He reports current alcohol use of about 1.0 standard drink of alcohol per week. He reports that he does not  use drugs.  Family History  Problem Relation Age of Onset   Heart Problems Father    Heart disease Brother    Diabetes Brother    Diabetes Brother    Heart Problems Mother    Pneumonia Maternal Grandfather     Medications: Patient's Medications  New Prescriptions   No medications on file  Previous Medications   BLOOD GLUCOSE METER KIT AND SUPPLIES    Dispense based on patient and insurance preference. Use up to four times daily as directed. (FOR ICD-10 E10.9, E11.9).   MULTIPLE VITAMINS-MINERALS (CENTRUM SILVER 50+MEN) TABS    Take 1 tablet by mouth daily.  Modified Medications   Modified Medication Previous Medication   APIXABAN (ELIQUIS) 2.5 MG TABS TABLET apixaban (ELIQUIS) 2.5 MG TABS tablet      Take 1 tablet (2.5 mg total) by mouth 2 (two) times daily.    Take 1 tablet (2.5 mg total) by mouth 2 (two) times daily.   COLCHICINE 0.6 MG TABLET colchicine 0.6 MG tablet      2 tablets now and may repeat 1 tablet in an hour.    2 tablets now and may repeat 1 tablet in an hour.   DOXAZOSIN (CARDURA) 8 MG TABLET doxazosin (CARDURA) 8 MG tablet      TAKE 1 TABLET BY MOUTH ONCE  DAILY    TAKE 1 TABLET BY MOUTH ONCE  DAILY   FUROSEMIDE (LASIX) 40 MG TABLET furosemide (LASIX) 40 MG tablet      TAKE 1 TABLET(40 MG) BY MOUTH DAILY    TAKE 1 TABLET(40 MG) BY MOUTH DAILY   METOPROLOL SUCCINATE (TOPROL-XL) 50 MG 24 HR TABLET metoprolol succinate (TOPROL-XL) 50 MG 24 hr tablet      Take 1 tablet (50 mg total) by mouth daily.    TAKE 1 TABLET BY MOUTH ONCE  DAILY   OLMESARTAN-AMLODIPINE-HCTZ 20-5-12.5 MG TABS Olmesartan-amLODIPine-HCTZ 20-5-12.5 MG TABS      Take 1 tablet by mouth daily.    Take 1 tablet by mouth daily.   POTASSIUM CHLORIDE (KLOR-CON M) 10 MEQ TABLET potassium chloride (KLOR-CON M) 10 MEQ tablet      TAKE 1 TABLET(10 MEQ) BY MOUTH DAILY    TAKE 1 TABLET(10 MEQ) BY MOUTH DAILY   ROSUVASTATIN (CRESTOR) 5 MG TABLET rosuvastatin (CRESTOR) 5 MG tablet      Take 1 tablet (5 mg total)  by mouth daily.    TAKE 1 TABLET BY MOUTH DAILY   SITAGLIPTIN (JANUVIA) 25 MG TABLET sitaGLIPtin (JANUVIA) 25 MG tablet      Take 1 tablet (25 mg total) by mouth daily.    Take 1 tablet (25 mg total) by mouth daily.  Discontinued Medications   ASPIRIN 81 MG EC TABLET    Take 1 tablet (81 mg total) by mouth daily.    Physical Exam:  Vitals:   08/30/22 1009 08/30/22 1331  BP: (!) 150/80 Marland Kitchen)  140/80  Pulse: 71   Resp: 17   Temp: 97.7 F (36.5 C)   SpO2: 92%   Weight: 203 lb 3.2 oz (92.2 kg)   Height: 5\' 8"  (1.727 m)    Body mass index is 30.9 kg/m. Wt Readings from Last 3 Encounters:  08/30/22 203 lb 3.2 oz (92.2 kg)  06/05/22 229 lb 9.6 oz (104.1 kg)  05/03/22 233 lb (105.7 kg)    Physical Exam Constitutional:      General: He is not in acute distress.    Appearance: He is well-developed. He is not diaphoretic.  HENT:     Head: Normocephalic and atraumatic.     Right Ear: External ear normal.     Left Ear: External ear normal.     Mouth/Throat:     Pharynx: No oropharyngeal exudate.  Eyes:     Conjunctiva/sclera: Conjunctivae normal.     Pupils: Pupils are equal, round, and reactive to light.  Cardiovascular:     Rate and Rhythm: Normal rate and regular rhythm.     Heart sounds: Normal heart sounds.  Pulmonary:     Effort: Pulmonary effort is normal.     Breath sounds: Normal breath sounds.  Abdominal:     General: Bowel sounds are normal.     Palpations: Abdomen is soft.  Musculoskeletal:        General: No tenderness.     Cervical back: Normal range of motion and neck supple.     Right lower leg: No edema.     Left lower leg: No edema.  Skin:    General: Skin is warm and dry.  Neurological:     Mental Status: He is alert and oriented to person, place, and time.     Labs reviewed: Basic Metabolic Panel: Recent Labs    04/13/22 1343 05/03/22 1504 08/28/22 1110  NA 141 140 141  K 3.8 3.5 3.8  CL 100 92* 99  CO2 35* 39* 34*  GLUCOSE 207* 247* 133   BUN 14 30* 51*  CREATININE 1.67* 1.80* 2.30*  CALCIUM 9.0 9.4 9.2   Liver Function Tests: Recent Labs    10/12/21 1330 05/03/22 1504 08/28/22 1110  AST 10 11 13   ALT 12 10 12   BILITOT 0.9 1.2 0.9  PROT 6.3 6.7 6.3   No results for input(s): "LIPASE", "AMYLASE" in the last 8760 hours. No results for input(s): "AMMONIA" in the last 8760 hours. CBC: Recent Labs    04/13/22 1343 05/03/22 1504 08/28/22 1110  WBC 6.5 8.7 7.6  NEUTROABS 4.6 6,377 4,712  HGB 13.6 13.8 12.8*  HCT 42.4 42.0 38.7  MCV 91.2 87.7 89.0  PLT 173 266 228   Lipid Panel: Recent Labs    10/12/21 1330 08/28/22 1110  CHOL 138 112  HDL 40 52  LDLCALC 79 46  TRIG 106 62  CHOLHDL 3.5 2.2   TSH: No results for input(s): "TSH" in the last 8760 hours. A1C: Lab Results  Component Value Date   HGBA1C 6.9 (H) 08/28/2022     Assessment/Plan 1. Venous ulcer of left leg (HCC) Has resolved   2. Type 2 diabetes mellitus with stage 3b chronic kidney disease, without long-term current use of insulin (HCC) -A1c now at goal -Encouraged dietary compliance, routine foot care/monitoring and to keep up with diabetic eye exams through ophthalmology  - sitaGLIPtin (JANUVIA) 25 MG tablet; Take 1 tablet (25 mg total) by mouth daily.  Dispense: 90 tablet; Refill: 1 - Urine microalbumin-creatinine with uACR  3. Stage 3b chronic kidney disease (HCC) -CKD slightly worse, he is not drinking a lot of water- will increase hydration and follow up bmp in 1 month.  Encourage proper hydration Follow metabolic panel Avoid nephrotoxic meds (NSAIDS) - BASIC METABOLIC PANEL WITH GFR; Future  4. Congestive heart failure, unspecified HF chronicity, unspecified heart failure type (HCC) -euvolemic at this time.  - furosemide (LASIX) 40 MG tablet; TAKE 1 TABLET(40 MG) BY MOUTH DAILY  Dispense: 90 tablet; Refill: 1 - metoprolol succinate (TOPROL-XL) 50 MG 24 hr tablet; Take 1 tablet (50 mg total) by mouth daily.  Dispense: 90  tablet; Refill: 1 - potassium chloride (KLOR-CON M) 10 MEQ tablet; TAKE 1 TABLET(10 MEQ) BY MOUTH DAILY  Dispense: 90 tablet; Refill: 1  5. Essential hypertension Elevated today because he has not gotten his medication in from pharmacy.  - doxazosin (CARDURA) 8 MG tablet; TAKE 1 TABLET BY MOUTH ONCE  DAILY  Dispense: 90 tablet; Refill: 1 - Olmesartan-amLODIPine-HCTZ 20-5-12.5 MG TABS; Take 1 tablet by mouth daily.  Dispense: 90 tablet; Refill: 2  6. Hyperlipidemia, unspecified hyperlipidemia type - rosuvastatin (CRESTOR) 5 MG tablet; Take 1 tablet (5 mg total) by mouth daily.  Dispense: 90 tablet; Refill: 1 -LDL at goal on crestor  7. Permanent atrial fibrillation (HCC) -rate controlled, continues on metoprolol  -educated to use eliquis twice daily as prescribed to get benefit from medications -discussed risk of not taking as prescribed.  - apixaban (ELIQUIS) 2.5 MG TABS tablet; Take 1 tablet (2.5 mg total) by mouth 2 (two) times daily.  Dispense: 180 tablet; Refill: 1  9. Chronic gout without tophus, unspecified cause, unspecified site No recent flare  10. Class 1 obesity due to excess calories with serious comorbidity and body mass index (BMI) of 30.0 to 30.9 in adult --education provided on healthy weight loss through increase in physical activity and proper nutrition   11. Advance care planning - Do not attempt resuscitation (DNR) -most form completed in office together.    Return in about 4 months (around 12/31/2022) for routine follow up .   Janene Harvey. Biagio Borg [facility] [phone]

## 2022-08-30 NOTE — Patient Instructions (Signed)
Follow up in 4 weeks for blood work Follow up 4 months for routine follow up

## 2022-08-31 LAB — MICROALBUMIN / CREATININE URINE RATIO
Creatinine, Urine: 46 mg/dL (ref 20–320)
Microalb Creat Ratio: 400 mg/g creat — ABNORMAL HIGH (ref <30)
Microalb, Ur: 18.4 mg/dL

## 2022-09-05 ENCOUNTER — Other Ambulatory Visit: Payer: Medicare Other | Admitting: Pharmacist

## 2022-09-05 ENCOUNTER — Other Ambulatory Visit: Payer: Self-pay

## 2022-09-05 ENCOUNTER — Other Ambulatory Visit (HOSPITAL_COMMUNITY): Payer: Self-pay

## 2022-09-05 MED ORDER — SITAGLIPTIN PHOSPHATE 25 MG PO TABS
25.0000 mg | ORAL_TABLET | Freq: Every day | ORAL | 1 refills | Status: DC
Start: 2022-09-05 — End: 2023-04-09
  Filled 2022-09-05: qty 90, 90d supply, fill #0

## 2022-09-05 MED ORDER — APIXABAN 2.5 MG PO TABS
2.5000 mg | ORAL_TABLET | Freq: Two times a day (BID) | ORAL | 1 refills | Status: DC
Start: 2022-09-05 — End: 2023-09-11
  Filled 2022-09-05: qty 180, 90d supply, fill #0
  Filled 2022-12-01 – 2022-12-05 (×2): qty 60, 30d supply, fill #0
  Filled 2023-02-08: qty 60, 30d supply, fill #1
  Filled 2023-04-02: qty 60, 30d supply, fill #2
  Filled 2023-06-04 – 2023-06-14 (×2): qty 60, 30d supply, fill #3

## 2022-09-05 MED ORDER — POTASSIUM CHLORIDE CRYS ER 10 MEQ PO TBCR
10.0000 meq | EXTENDED_RELEASE_TABLET | Freq: Every day | ORAL | 1 refills | Status: DC
Start: 2022-09-05 — End: 2023-08-01
  Filled 2022-09-05 – 2022-12-26 (×2): qty 90, 90d supply, fill #0
  Filled 2023-04-02: qty 90, 90d supply, fill #1

## 2022-09-05 MED ORDER — OLMESARTAN-AMLODIPINE-HCTZ 20-5-12.5 MG PO TABS: 1.0000 | ORAL_TABLET | Freq: Every day | ORAL | 2 refills | Status: DC

## 2022-09-05 MED ORDER — FUROSEMIDE 40 MG PO TABS
40.0000 mg | ORAL_TABLET | Freq: Every day | ORAL | 1 refills | Status: DC
Start: 2022-09-05 — End: 2023-03-19
  Filled 2022-09-05 – 2022-10-02 (×2): qty 90, 90d supply, fill #0
  Filled 2022-12-26: qty 90, 90d supply, fill #1

## 2022-09-05 MED ORDER — APIXABAN 2.5 MG PO TABS
2.5000 mg | ORAL_TABLET | Freq: Two times a day (BID) | ORAL | 1 refills | Status: DC
  Filled 2022-09-05: qty 60, 30d supply, fill #0

## 2022-09-05 MED ORDER — ROSUVASTATIN CALCIUM 5 MG PO TABS
5.0000 mg | ORAL_TABLET | Freq: Every day | ORAL | 1 refills | Status: DC
Start: 2022-09-05 — End: 2024-01-31
  Filled 2022-09-05 – 2023-06-26 (×2): qty 90, 90d supply, fill #0

## 2022-09-05 MED ORDER — FUROSEMIDE 40 MG PO TABS
40.0000 mg | ORAL_TABLET | Freq: Every day | ORAL | 1 refills | Status: DC
  Filled 2022-09-05: qty 90, 90d supply, fill #0

## 2022-09-05 MED ORDER — METOPROLOL SUCCINATE ER 50 MG PO TB24
50.0000 mg | ORAL_TABLET | Freq: Every day | ORAL | 1 refills | Status: DC
Start: 2022-09-05 — End: 2023-06-04
  Filled 2022-09-05 – 2022-12-05 (×2): qty 90, 90d supply, fill #0
  Filled 2023-03-05: qty 90, 90d supply, fill #1

## 2022-09-05 MED ORDER — DAPAGLIFLOZIN PROPANEDIOL 5 MG PO TABS: 5.0000 mg | ORAL_TABLET | Freq: Every day | ORAL | 3 refills | Status: DC

## 2022-09-05 MED ORDER — JANUVIA 25 MG PO TABS
25.0000 mg | ORAL_TABLET | Freq: Every day | ORAL | 2 refills | Status: DC
  Filled 2022-09-05: qty 30, 30d supply, fill #0

## 2022-09-05 MED ORDER — DOXAZOSIN MESYLATE 8 MG PO TABS
8.0000 mg | ORAL_TABLET | Freq: Every day | ORAL | 1 refills | Status: DC
Start: 2022-09-05 — End: 2023-06-26
  Filled 2022-09-05 – 2023-01-03 (×2): qty 90, 90d supply, fill #0
  Filled 2023-04-02: qty 90, 90d supply, fill #1

## 2022-09-05 MED ORDER — COLCHICINE 0.6 MG PO TABS
ORAL_TABLET | ORAL | 0 refills | Status: DC
  Filled 2022-09-05: qty 12, 4d supply, fill #0
  Filled 2022-11-24 (×2): qty 12, 5d supply, fill #0

## 2022-09-05 MED ORDER — POTASSIUM CHLORIDE ER 10 MEQ PO TBCR: 10.0000 meq | EXTENDED_RELEASE_TABLET | Freq: Every day | ORAL | 2 refills | Status: DC

## 2022-09-05 MED ORDER — OLMESARTAN-AMLODIPINE-HCTZ 20-5-12.5 MG PO TABS
1.0000 | ORAL_TABLET | Freq: Every day | ORAL | 2 refills | Status: DC
Start: 2022-09-05 — End: 2023-06-26
  Filled 2022-09-05 – 2022-10-02 (×2): qty 90, 90d supply, fill #0
  Filled 2022-12-27: qty 90, 90d supply, fill #1
  Filled 2023-04-02: qty 90, 90d supply, fill #2

## 2022-09-05 NOTE — Telephone Encounter (Signed)
Prescriptions refilled by PCP Janyth Contes Janene Harvey, NP

## 2022-09-05 NOTE — Progress Notes (Signed)
09/05/2022 Name: Douglas Soto MRN: [id number] DOB: February 05, 1941  Chief Complaint  Patient presents with   Diabetes   Medication Assistance    Douglas Soto is a 82 y.o. year old male who presented for a telephone visit.   They were referred to the pharmacist by their PCP for assistance in managing diabetes and medication access.    Subjective:  Care Team: Primary Care Provider: Sharon Seller, NP ; Next Scheduled Visit: 08/28/22   Medication Access/Adherence  Current Pharmacy:  [facility] [id number] [place], [place] - [address] AT [facility] OF [address] [place] [address] Phone: [phone] Fax: [phone]  [facility] - [place], [place] - [address] [place] [address] Phone: [phone] Fax: [phone]  [id number] [place], [place] - [address] AT [facility] OF [address] [place] [address] Phone: [phone] Fax: [phone]  [facility] - [address], [place] - [address] [place] [address] Phone: [phone] Fax: [phone]  [place] - [facility] [address] [place] [address] Phone: [phone] Fax: [phone]   Patient reports affordability concerns with their medications: Yes , Alma Friendly, eliquis Patient reports access/transportation concerns to their pharmacy: drives to pharmacy, but having issues with [facility] and wanting to use a different pharmacy Patient reports adherence concerns with their medications:  patient is out of eliquis and [place], having trouble with [facility] and wants to use a different pharmacy.   Diabetes:  Current medications: januvia 25mg  daily. "Just started this medicine" $300 for 30DS  Medications tried in the past:  - metformin in distant past, "made him feel like crap, his whole body" but  did not report specific side effects - jardiance recently: stopped due to rash, itching, hives  Current glucose readings: not currently checking and not interested at this time  Patient denies hypoglycemic s/sx including dizziness, shakiness, sweating. Patient denies hyperglycemic symptoms including polyuria, polydipsia, polyphagia, nocturia, neuropathy, blurred vision.  Current meal patterns:  - Breakfast: scrambled eggs & cheese, occasionally chex cereal + milk with fruit - Lunch: chicken sandwich on whole wheat bread - Supper: broccoli, peas. (Cornbread occasionally, does not eat much meat at dinner) - Snacks: bananas maybe, does not  - Drinks: water, and zero sugar [place] dry  Patient reports he lost 49lb over 6 months. 249 to now 199lb   Current physical activity: limited by heart failure and "runs out of steam"  Current medication access support: none at present, was set up with jardiance PAP but discontinued after side effects   Objective:  Lab Results  Component Value Date   HGBA1C 6.9 (H) 08/28/2022    Lab Results  Component Value Date   CREATININE 2.30 (H) 08/28/2022   BUN 51 (H) 08/28/2022   NA 141 08/28/2022   K 3.8 08/28/2022   CL 99 08/28/2022   CO2 34 (H) 08/28/2022    Lab Results  Component Value Date   CHOL 112 08/28/2022   HDL 52 08/28/2022   LDLCALC 46 08/28/2022   TRIG 62 08/28/2022   CHOLHDL 2.2 08/28/2022    Medications Reviewed Today     Reviewed by Douglas Soto, RPH (Pharmacist) on 09/05/22 at 1331  Med List Status: <None>   Medication Order Taking? Sig Documenting Provider Last Dose Status Informant  apixaban (  ELIQUIS) 2.5 MG TABS tablet [id number]  Take 1 tablet (2.5 mg total) by mouth 2 (two) times daily. Douglas Seller, NP  Active   blood glucose meter kit and supplies [id number] No Dispense based on patient and insurance preference. Use up to four times daily as directed. (FOR ICD-10 E10.9, E11.9). Douglas Seller, NP Taking  Active   colchicine 0.6 MG tablet [id number]  2 tablets now and may repeat 1 tablet in an hour. Douglas Seller, NP  Active   dapagliflozin propanediol (FARXIGA) 5 MG TABS tablet [id number] Yes Take 1 tablet (5 mg total) by mouth daily before breakfast. Douglas Seller, NP  Active   doxazosin (CARDURA) 8 MG tablet [id number]  TAKE 1 TABLET BY MOUTH ONCE  DAILY Douglas Seller, NP  Active   furosemide (LASIX) 40 MG tablet [id number]  TAKE 1 TABLET(40 MG) BY MOUTH DAILY Douglas Seller, NP  Active   metoprolol succinate (TOPROL-XL) 50 MG 24 hr tablet [id number]  Take 1 tablet (50 mg total) by mouth daily. Douglas Seller, NP  Active   Multiple Vitamins-Minerals (CENTRUM SILVER 50+MEN) TABS [id number] No Take 1 tablet by mouth daily. [provider] Taking Active Self  Olmesartan-amLODIPine-HCTZ 20-5-12.5 MG TABS [id number]  Take 1 tablet by mouth daily. Douglas Seller, NP  Active   potassium chloride (KLOR-CON M) 10 MEQ tablet [id number]  TAKE 1 TABLET(10 MEQ) BY MOUTH DAILY Douglas Seller, NP  Active   rosuvastatin (CRESTOR) 5 MG tablet [id number]  Take 1 tablet (5 mg total) by mouth daily. Douglas Seller, NP  Active   sitaGLIPtin (JANUVIA) 25 MG tablet [id number]  Take 1 tablet (25 mg total) by mouth daily. Douglas Seller, NP  Active              Assessment/Plan:   Diabetes: - Currently controlled - Reviewed long term cardiovascular and renal outcomes of uncontrolled blood sugar - Reviewed goal A1c, goal fasting, and goal 2 hour post prandial glucose - Reviewed dietary modifications including lean meats/nuts for proteins, greens, and doing carbs/sweets in moderation - Recommend to continue current regimen, today arranged for the following to resolve medication access barriers with both logistics, transportation, and costs: --> transferred medications to [facility] for mail order, patient very excited about this option ---> Enrolled into patient  assistance via [employer] to start [place] 5mg  daily for blood glucose, CV and renal benefits (collaborated with provider as well as counseled patient on medication). Completed drug review, farxiga is not associated with rash/itching that patient experienced with jardiance.  ---> Enrolled into healthwell grant for type 2 diabetes to cover copay costs of januvia 25mg  daily   - Recommend to check glucose in future if amenable, patient wants to discuss with PCP before implementing any glucose checks whether traditional or CGM - (of note, patient does not qualify for free cost of continuous glucose monitoring, could arrange sample if patient desires) - Successfully enrolled into farxiga patient assistance program through [employer]. Will collaborate in future with provider, CPhT, and patient to ensure application renewal will be set up for 2025 later this year. - Also due for healthwell renewal 08/05/2023  Follow Up Plan: 2-3 weeks to ensure all medications were successfully transferred to [facility] for mail order & patient receiving them smoothly  Lynnda Shields, PharmD, BCPS Clinical Pharmacist [facility]

## 2022-09-06 ENCOUNTER — Other Ambulatory Visit (HOSPITAL_COMMUNITY): Payer: Self-pay

## 2022-09-06 ENCOUNTER — Other Ambulatory Visit: Payer: Self-pay

## 2022-09-07 ENCOUNTER — Other Ambulatory Visit: Payer: Self-pay

## 2022-09-07 ENCOUNTER — Other Ambulatory Visit (HOSPITAL_COMMUNITY): Payer: Self-pay

## 2022-09-08 ENCOUNTER — Other Ambulatory Visit: Payer: Self-pay

## 2022-09-12 ENCOUNTER — Other Ambulatory Visit (HOSPITAL_COMMUNITY): Payer: Self-pay

## 2022-09-18 ENCOUNTER — Telehealth: Payer: Self-pay

## 2022-09-18 NOTE — Progress Notes (Signed)
   09/18/2022  Patient ID: Douglas Soto, male   DOB: February 07, 1941, 82 y.o.   MRN: [id number]  Patient outreach to check in on medication transfers to [facility] for mail order.  Patient has received Januvia and Eliquis- all other active prescriptions have been sent but are not due to be filled at this time.  Instructed patient to call [facility] when he starts getting low to request refills.  Informed him that Farxiga PAP has been approved, and he should receive the medication at his home in the next week or so.  He questioned if Marcelline Deist would cause similar sensitivity reaction he had to Jardiance (rash/itching/whelps).  Per Thurston Hole Kline's last note, Marcelline Deist should not lead to skin irritation.  Informed patient to contact office or me if this did occur.    Provided my direct number in case there are any further medication needs while Thurston Hole out of the office.  Lenna Gilford, PharmD, DPLA

## 2022-09-19 ENCOUNTER — Other Ambulatory Visit: Payer: Self-pay | Admitting: Nurse Practitioner

## 2022-09-27 ENCOUNTER — Telehealth: Payer: Self-pay | Admitting: Pharmacy Technician

## 2022-09-27 ENCOUNTER — Telehealth: Payer: Self-pay

## 2022-09-27 DIAGNOSIS — Z5986 Financial insecurity: Secondary | ICD-10-CM

## 2022-09-27 NOTE — Progress Notes (Signed)
   09/27/2022  Patient ID: Lavell Islam, male   DOB: Jun 30, 1940, 82 y.o.   MRN: [id number]  Received notification from [facility], CPhT that patient has been approved for Januvia PAP through [employer].  Approval 09/20/22-04/17/23. His 1st 90 day shipment is to arrive this week. He has informed to call [employer] in September for his next refill at [phone] or their pharmacy, [facility], at [phone]. No longer fill this medication through [facility].  Contacted [employer] to check on shipment status of Farxiga, and they stated they were waiting on prescription from provider.  This was already sent to the PAP pharmacy, so I was transferred to [facility].  Confirmed prescription had been received, and they put into process.  Will call [employer] in 24-48 hours to verify medication is shipping.  Lenna Gilford, PharmD, DPLA

## 2022-09-27 NOTE — Progress Notes (Signed)
[facility])                                            [facility] Quality Pharmacy Team    09/27/2022  DOSSIE SWOR 1940/09/09 [id number]  Care coordination call placed to [employer] in regard to [place] application.  Spoke to Rubin Payor who informs patient is APPROVED 09/20/22-04/17/23. Initial shipment has already been processed with delivery to the patient's home address on file. Subsequent refills will have to be phoned into [employer] or their pharmacy, [facility], by the patient. The phone numbers to call are as follows: [employer] at [phone], [facility] at [phone].  Patient needs to allow them 10-14 business days for processing and delivery and therefore should call in refills about 2 weeks before running out of medication.  Joriel Streety P. Roniesha Hollingshead, CPhT [facility]  [phone]

## 2022-09-29 ENCOUNTER — Telehealth: Payer: Self-pay

## 2022-09-29 NOTE — Progress Notes (Unsigned)
   09/29/2022  Patient ID: Douglas Soto, male   DOB: [birth year], 82 y.o.   MRN: [id number]  Called Farxiga PAP to check on fill/ship status of Douglas Soto  5mg  prescription.  It is processing now, but may still take another 10-14 days for patient to receive.  Called patient to inform about Marcelline Deist status and verify he is aware Januvia will now be received through PAP; and he does not need to fill this at the pharmacy.  Douglas Soto inquired about any medication interactions or contraindications to these therapies.  Informed him that there are no medication interactions based on his current regimen.  Told him to call me if any needs/concerns arise.  Lenna Gilford, PharmD, DPLA

## 2022-10-02 ENCOUNTER — Other Ambulatory Visit (HOSPITAL_COMMUNITY): Payer: Self-pay

## 2022-10-24 ENCOUNTER — Other Ambulatory Visit (HOSPITAL_COMMUNITY): Payer: Self-pay

## 2022-10-24 ENCOUNTER — Other Ambulatory Visit: Payer: Self-pay | Admitting: Nurse Practitioner

## 2022-10-25 ENCOUNTER — Other Ambulatory Visit (HOSPITAL_COMMUNITY): Payer: Self-pay

## 2022-11-24 ENCOUNTER — Other Ambulatory Visit (HOSPITAL_COMMUNITY): Payer: Self-pay

## 2022-11-24 ENCOUNTER — Telehealth: Payer: Self-pay | Admitting: *Deleted

## 2022-11-24 NOTE — Telephone Encounter (Signed)
Patient called and stated that he was having a Gout Flare and it was very Painful.  Patient has NOT taken his Colchicine for this.   Refills are in the Current medication list. He stated that he will call and get it filled and see if that will give him some relief.   I offered patient an appointment for today but he refused.

## 2022-11-24 NOTE — Telephone Encounter (Signed)
Agree with using the colchine

## 2022-12-01 ENCOUNTER — Other Ambulatory Visit (HOSPITAL_COMMUNITY): Payer: Self-pay

## 2022-12-05 ENCOUNTER — Other Ambulatory Visit (HOSPITAL_COMMUNITY): Payer: Self-pay

## 2022-12-05 ENCOUNTER — Other Ambulatory Visit: Payer: Self-pay

## 2022-12-07 ENCOUNTER — Other Ambulatory Visit (HOSPITAL_COMMUNITY): Payer: Self-pay

## 2022-12-26 ENCOUNTER — Other Ambulatory Visit (HOSPITAL_COMMUNITY): Payer: Self-pay

## 2022-12-27 ENCOUNTER — Other Ambulatory Visit (HOSPITAL_COMMUNITY): Payer: Self-pay

## 2023-01-03 ENCOUNTER — Other Ambulatory Visit: Payer: Self-pay | Admitting: Nurse Practitioner

## 2023-01-03 ENCOUNTER — Other Ambulatory Visit (HOSPITAL_COMMUNITY): Payer: Self-pay

## 2023-01-03 ENCOUNTER — Other Ambulatory Visit: Payer: Self-pay

## 2023-01-03 DIAGNOSIS — M109 Gout, unspecified: Secondary | ICD-10-CM

## 2023-01-03 MED ORDER — COLCHICINE 0.6 MG PO TABS
ORAL_TABLET | ORAL | 1 refills | Status: DC
  Filled 2023-01-03: qty 12, 4d supply, fill #0

## 2023-01-04 ENCOUNTER — Other Ambulatory Visit: Payer: Self-pay

## 2023-01-08 ENCOUNTER — Other Ambulatory Visit: Payer: Self-pay

## 2023-02-08 ENCOUNTER — Other Ambulatory Visit (HOSPITAL_COMMUNITY): Payer: Self-pay

## 2023-03-05 ENCOUNTER — Other Ambulatory Visit (HOSPITAL_COMMUNITY): Payer: Self-pay

## 2023-03-06 ENCOUNTER — Other Ambulatory Visit (HOSPITAL_COMMUNITY): Payer: Self-pay

## 2023-03-09 ENCOUNTER — Ambulatory Visit: Payer: Medicare Other | Admitting: Nurse Practitioner

## 2023-03-19 ENCOUNTER — Other Ambulatory Visit: Payer: Self-pay | Admitting: Nurse Practitioner

## 2023-03-19 ENCOUNTER — Other Ambulatory Visit: Payer: Self-pay

## 2023-03-19 ENCOUNTER — Other Ambulatory Visit (HOSPITAL_COMMUNITY): Payer: Self-pay

## 2023-03-19 DIAGNOSIS — I509 Heart failure, unspecified: Secondary | ICD-10-CM

## 2023-03-19 MED ORDER — FUROSEMIDE 40 MG PO TABS
40.0000 mg | ORAL_TABLET | Freq: Every day | ORAL | 0 refills | Status: DC
  Filled 2023-03-19: qty 30, 30d supply, fill #0

## 2023-03-22 ENCOUNTER — Other Ambulatory Visit: Payer: Self-pay

## 2023-03-22 MED ORDER — DAPAGLIFLOZIN PROPANEDIOL 5 MG PO TABS: 5.0000 mg | ORAL_TABLET | Freq: Every day | ORAL | 0 refills | Status: DC

## 2023-03-22 NOTE — Telephone Encounter (Signed)
E-Prescription for medication came through [facility] from [facility]

## 2023-04-02 ENCOUNTER — Other Ambulatory Visit: Payer: Self-pay

## 2023-04-02 ENCOUNTER — Other Ambulatory Visit (HOSPITAL_COMMUNITY): Payer: Self-pay

## 2023-04-09 ENCOUNTER — Encounter: Payer: Self-pay | Admitting: Nurse Practitioner

## 2023-04-09 ENCOUNTER — Ambulatory Visit (INDEPENDENT_AMBULATORY_CARE_PROVIDER_SITE_OTHER): Payer: Medicare Other | Admitting: Nurse Practitioner

## 2023-04-09 VITALS — BP 128/84 | HR 90 | Temp 97.1°F | Ht 68.0 in | Wt 206.0 lb

## 2023-04-09 DIAGNOSIS — I509 Heart failure, unspecified: Secondary | ICD-10-CM | POA: Diagnosis not present

## 2023-04-09 DIAGNOSIS — Z6830 Body mass index (BMI) 30.0-30.9, adult: Secondary | ICD-10-CM

## 2023-04-09 DIAGNOSIS — E1122 Type 2 diabetes mellitus with diabetic chronic kidney disease: Secondary | ICD-10-CM

## 2023-04-09 DIAGNOSIS — E66811 Obesity, class 1: Secondary | ICD-10-CM

## 2023-04-09 DIAGNOSIS — I10 Essential (primary) hypertension: Secondary | ICD-10-CM

## 2023-04-09 DIAGNOSIS — Z23 Encounter for immunization: Secondary | ICD-10-CM

## 2023-04-09 DIAGNOSIS — I4821 Permanent atrial fibrillation: Secondary | ICD-10-CM | POA: Diagnosis not present

## 2023-04-09 DIAGNOSIS — E7800 Pure hypercholesterolemia, unspecified: Secondary | ICD-10-CM | POA: Diagnosis not present

## 2023-04-09 DIAGNOSIS — M1A9XX0 Chronic gout, unspecified, without tophus (tophi): Secondary | ICD-10-CM

## 2023-04-09 DIAGNOSIS — E6609 Other obesity due to excess calories: Secondary | ICD-10-CM

## 2023-04-09 DIAGNOSIS — N1832 Chronic kidney disease, stage 3b: Secondary | ICD-10-CM | POA: Diagnosis not present

## 2023-04-09 NOTE — Progress Notes (Signed)
Careteam: Patient Care Team: Sharon Seller, NP as PCP - General (Geriatric Medicine) Quintella Reichert, MD as PCP - Cardiology (Cardiology)  PLACE OF SERVICE:  [facility]  Advanced Directive information Does Patient Have a Medical Advance Directive?: Yes, Type of Advance Directive: Out of facility DNR (pink MOST or yellow form), Pre-existing out of facility DNR order (yellow form or pink MOST form): Pink MOST form placed in chart (order not valid for inpatient use), Does patient want to make changes to medical advance directive?: No - Patient declined  Allergies  Allergen Reactions   Allopurinol Other (See Comments)    Welts in the body and and blisters in the mouth   Jardiance [Empagliflozin] Itching    Itching and joint pain     Chief Complaint  Patient presents with   Medical Management of Chronic Issues    7 month follow-up. Discuss need for shingrix, pneumonia vaccine, AWV, flu vaccine, A1c and eye exam. NCIR verified.      HPI: Patient is a 82 y.o. male for routine follow up.   He did not take the [place] and was afraid to take the farxiga but now he is taking and reports doing well Was having pain in his feet but this has improved. Does not check blood sugars at home He is only taking faxiga- started this 6 weeks ago and has plenty of this medication No numbness or tingling to feet.  The DMV wants him to get his eye check.   Unsure if he had a gout flare or not in August- stopped eating meat.   Hyperlipidemia- on crestor.   No LE edema  No wounds.   No falls.   He is unsure about his vaccines, he feels like should  He does not see an eye doctor.    Review of Systems:  Review of Systems  Constitutional:  Negative for chills, fever and weight loss.  HENT:  Negative for tinnitus.   Respiratory:  Negative for cough, sputum production and shortness of breath.   Cardiovascular:  Negative for chest pain, palpitations and leg swelling.  Gastrointestinal:   Negative for abdominal pain, constipation, diarrhea and heartburn.  Genitourinary:  Negative for dysuria, frequency and urgency.  Musculoskeletal:  Negative for back pain, falls, joint pain and myalgias.  Skin: Negative.   Neurological:  Negative for dizziness and headaches.  Psychiatric/Behavioral:  Negative for depression and memory loss. The patient does not have insomnia.     Past Medical History:  Diagnosis Date   Cholelithiasis 02/2018   uncomplicated.     Chronic kidney disease, stage II (mild)    Depression    Diabetes mellitus without complication (HCC)    Essential hypertension, malignant    Fatty liver 02/2018   on ultrasound   Gout, unspecified    Hyperlipidemia    Lumbago    Obesity, unspecified    Pain in joint, pelvic region and thigh    Pleural effusion on right 02/2018   Past Surgical History:  Procedure Laterality Date   APPENDECTOMY     COLONOSCOPY WITH PROPOFOL N/A 02/22/2018   Procedure: COLONOSCOPY WITH PROPOFOL;  Surgeon: Rachael Fee, MD;  Location: [facility] ENDOSCOPY;  Service: Endoscopy;  Laterality: N/A;   CYST EXCISION  1998   back   ESOPHAGOGASTRODUODENOSCOPY (EGD) WITH PROPOFOL N/A 02/22/2018   Procedure: ESOPHAGOGASTRODUODENOSCOPY (EGD) WITH PROPOFOL;  Surgeon: Rachael Fee, MD;  Location: [facility] ENDOSCOPY;  Service: Endoscopy;  Laterality: N/A;   POLYPECTOMY  02/22/2018  Procedure: POLYPECTOMY;  Surgeon: Rachael Fee, MD;  Location: [facility] ENDOSCOPY;  Service: Endoscopy;;   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 His smokeless tobacco use includes chew. He reports current alcohol use of about 1.0 standard drink of alcohol per week. He reports that he does not use drugs.  Family History  Problem Relation Age of Onset   Heart Problems Father    Heart disease Brother    Diabetes Brother    Diabetes Brother    Heart Problems Mother    Pneumonia Maternal Grandfather      Medications: Patient's Medications  New Prescriptions   No medications on file  Previous Medications   APIXABAN (ELIQUIS) 2.5 MG TABS TABLET    Take 1 tablet (2.5 mg total) by mouth 2 (two) times daily.   BLOOD GLUCOSE METER KIT AND SUPPLIES    Dispense based on patient and insurance preference. Use up to four times daily as directed. (FOR ICD-10 E10.9, E11.9).   COLCHICINE 0.6 MG TABLET    TAKE 2 TABLETS BY MOUTH NOW AND  MAY REPEAT 1 TABLET IN AN HOUR  AS DIRECTED   DAPAGLIFLOZIN PROPANEDIOL (FARXIGA) 5 MG TABS TABLET    Take 1 tablet (5 mg total) by mouth daily before breakfast.   DOXAZOSIN (CARDURA) 8 MG TABLET    Take 1 tablet (8 mg total) by mouth daily.   FUROSEMIDE (LASIX) 40 MG TABLET    Take 1 tablet (40 mg total) by mouth daily. APPOINTMENT DUE   METOPROLOL SUCCINATE (TOPROL-XL) 50 MG 24 HR TABLET    Take 1 tablet (50 mg total) by mouth daily.   MULTIPLE VITAMINS-MINERALS (CENTRUM SILVER 50+MEN) TABS    Take 1 tablet by mouth daily.   OLMESARTAN-AMLODIPINE-HCTZ 20-5-12.5 MG TABS    Take 1 tablet by mouth daily.   POTASSIUM CHLORIDE (KLOR-CON M) 10 MEQ TABLET    Take 1 tablet (10 mEq total) by mouth daily.   ROSUVASTATIN (CRESTOR) 5 MG TABLET    Take 1 tablet (5 mg total) by mouth daily.  Modified Medications   No medications on file  Discontinued Medications   COLCHICINE 0.6 MG TABLET    Take 2 tablets by mouth now and may repeat 1 tablet in an hour.   SITAGLIPTIN (JANUVIA) 25 MG TABLET    Take 1 tablet (25 mg total) by mouth daily.    Physical Exam:  Vitals:   04/09/23 1056  BP: 128/84  Pulse: 90  Temp: (!) 97.1 F (36.2 C)  TempSrc: Temporal  SpO2: 96%  Weight: 206 lb (93.4 kg)  Height: 5\' 8"  (1.727 m)   Body mass index is 31.32 kg/m. Wt Readings from Last 3 Encounters:  04/09/23 206 lb (93.4 kg)  08/30/22 203 lb 3.2 oz (92.2 kg)  06/05/22 229 lb 9.6 oz (104.1 kg)    Physical Exam Constitutional:      General: He is not in acute distress.     Appearance: He is well-developed. He is not diaphoretic.  HENT:     Head: Normocephalic and atraumatic.     Right Ear: External ear normal.     Left Ear: External ear normal.     Mouth/Throat:     Pharynx: No oropharyngeal exudate.  Eyes:     Conjunctiva/sclera: Conjunctivae normal.     Pupils: Pupils are equal, round, and reactive to light.  Cardiovascular:     Rate and Rhythm: Normal  rate and regular rhythm.     Heart sounds: Normal heart sounds.  Pulmonary:     Effort: Pulmonary effort is normal.     Breath sounds: Normal breath sounds.  Abdominal:     General: Bowel sounds are normal.     Palpations: Abdomen is soft.  Musculoskeletal:        General: No tenderness.     Cervical back: Normal range of motion and neck supple.     Right lower leg: No edema.     Left lower leg: No edema.  Skin:    General: Skin is warm and dry.  Neurological:     Mental Status: He is alert and oriented to person, place, and time.     Labs reviewed: Basic Metabolic Panel: Recent Labs    04/13/22 1343 05/03/22 1504 08/28/22 1110  NA 141 140 141  K 3.8 3.5 3.8  CL 100 92* 99  CO2 35* 39* 34*  GLUCOSE 207* 247* 133  BUN 14 30* 51*  CREATININE 1.67* 1.80* 2.30*  CALCIUM 9.0 9.4 9.2   Liver Function Tests: Recent Labs    05/03/22 1504 08/28/22 1110  AST 11 13  ALT 10 12  BILITOT 1.2 0.9  PROT 6.7 6.3   No results for input(s): "LIPASE", "AMYLASE" in the last 8760 hours. No results for input(s): "AMMONIA" in the last 8760 hours. CBC: Recent Labs    04/13/22 1343 05/03/22 1504 08/28/22 1110  WBC 6.5 8.7 7.6  NEUTROABS 4.6 6,377 4,712  HGB 13.6 13.8 12.8*  HCT 42.4 42.0 38.7  MCV 91.2 87.7 89.0  PLT 173 266 228   Lipid Panel: Recent Labs    08/28/22 1110  CHOL 112  HDL 52  LDLCALC 46  TRIG 62  CHOLHDL 2.2   TSH: No results for input(s): "TSH" in the last 8760 hours. A1C: Lab Results  Component Value Date   HGBA1C 6.9 (H) 08/28/2022      Assessment/Plan 1. Type 2 diabetes mellitus with stage 3b chronic kidney disease, without long-term current use of insulin (HCC) (Primary) -had been off medication but now on farxiga for the last 6 weeks.  Encouraged dietary compliance, routine foot care/monitoring and to keep up with diabetic eye exams through ophthalmology  - Hemoglobin A1c - Ambulatory referral to Ophthalmology  2. Stage 3b chronic kidney disease (HCC) -Chronic and stable Encourage proper hydration Follow metabolic panel Avoid nephrotoxic meds (NSAIDS)   3. Congestive heart failure, unspecified HF chronicity, unspecified heart failure type (HCC) Euvolemic. Does not wish to follow up with cardiologist. No chest pains, le edema Continues on farxiga, lasix and metoprolol  4. Essential hypertension -Blood pressure well controlled, goal bp <140/90 Continue current medications and dietary modifications follow metabolic panel - COMPLETE METABOLIC PANEL WITH GFR - CBC with Differential/Platelet  5. Permanent atrial fibrillation (HCC) -rate controlled , continues on eliquis for anticoagulation  - COMPLETE METABOLIC PANEL WITH GFR - CBC with Differential/Platelet  6. Pure hypercholesterolemia Continues on crestor 5 mg daily - Lipid panel  7. Chronic gout without tophus, unspecified cause, unspecified site -he does not want to take colchicine, states the flare is better than the medication.  Will follow up uric acid level at this time - Uric Acid  8. Class 1 obesity due to excess calories with serious comorbidity and body mass index (BMI) of 30.0 to 30.9 in adult -he is eating better, education provided on healthy weight loss through increase in physical activity and proper nutrition   9.  Flu vaccine need  - Flu Vaccine Trivalent High Dose (Fluad)  10. Need for pneumococcal vaccine  - Pneumococcal conjugate vaccine 20-valent (Prevnar 20)  Return in about 6 months (around 10/08/2023) for follow up.  Sooner if needed.   Janene Harvey. [facility] [phone]

## 2023-04-10 LAB — COMPLETE METABOLIC PANEL WITH GFR
AG Ratio: 1.3 (calc) (ref 1.0–2.5)
ALT: 9 U/L (ref 9–46)
AST: 10 U/L (ref 10–35)
Albumin: 3.8 g/dL (ref 3.6–5.1)
Alkaline phosphatase (APISO): 91 U/L (ref 35–144)
BUN/Creatinine Ratio: 19 (calc) (ref 6–22)
BUN: 42 mg/dL — ABNORMAL HIGH (ref 7–25)
CO2: 35 mmol/L — ABNORMAL HIGH (ref 20–32)
Calcium: 10.1 mg/dL (ref 8.6–10.3)
Chloride: 97 mmol/L — ABNORMAL LOW (ref 98–110)
Creat: 2.21 mg/dL — ABNORMAL HIGH (ref 0.70–1.22)
Globulin: 2.9 g/dL (ref 1.9–3.7)
Glucose, Bld: 209 mg/dL — ABNORMAL HIGH (ref 65–139)
Potassium: 3.9 mmol/L (ref 3.5–5.3)
Sodium: 140 mmol/L (ref 135–146)
Total Bilirubin: 0.6 mg/dL (ref 0.2–1.2)
Total Protein: 6.7 g/dL (ref 6.1–8.1)
eGFR: 29 mL/min/{1.73_m2} — ABNORMAL LOW (ref >=60)

## 2023-04-10 LAB — CBC WITH DIFFERENTIAL/PLATELET
Absolute Lymphocytes: 1806 {cells}/uL (ref 850–3900)
Absolute Monocytes: 632 {cells}/uL (ref 200–950)
Basophils Absolute: 57 {cells}/uL (ref 0–200)
Basophils Relative: 0.7 %
Eosinophils Absolute: 259 {cells}/uL (ref 15–500)
Eosinophils Relative: 3.2 %
HCT: 38.9 % (ref 38.5–50.0)
Hemoglobin: 12.9 g/dL — ABNORMAL LOW (ref 13.2–17.1)
MCH: 29.5 pg (ref 27.0–33.0)
MCHC: 33.2 g/dL (ref 32.0–36.0)
MCV: 88.8 fL (ref 80.0–100.0)
MPV: 9.6 fL (ref 7.5–12.5)
Monocytes Relative: 7.8 %
Neutro Abs: 5346 {cells}/uL (ref 1500–7800)
Neutrophils Relative %: 66 %
Platelets: 244 10*3/uL (ref 140–400)
RBC: 4.38 10*6/uL (ref 4.20–5.80)
RDW: 13.1 % (ref 11.0–15.0)
Total Lymphocyte: 22.3 %
WBC: 8.1 10*3/uL (ref 3.8–10.8)

## 2023-04-10 LAB — LIPID PANEL
Cholesterol: 118 mg/dL (ref <200)
HDL: 36 mg/dL — ABNORMAL LOW (ref >=40)
LDL Cholesterol (Calc): 64 mg/dL
Non-HDL Cholesterol (Calc): 82 mg/dL (ref <130)
Total CHOL/HDL Ratio: 3.3 (calc) (ref <5.0)
Triglycerides: 98 mg/dL (ref <150)

## 2023-04-10 LAB — HEMOGLOBIN A1C
Hgb A1c MFr Bld: 8.2 %{Hb} — ABNORMAL HIGH (ref <5.7)
Mean Plasma Glucose: 189 mg/dL
eAG (mmol/L): 10.4 mmol/L

## 2023-04-10 LAB — URIC ACID: Uric Acid, Serum: 10 mg/dL — ABNORMAL HIGH (ref 4.0–8.0)

## 2023-04-12 ENCOUNTER — Telehealth: Payer: Self-pay

## 2023-04-12 NOTE — Telephone Encounter (Signed)
Patient called to check on his referral. I gave him the number for [facility].

## 2023-04-23 ENCOUNTER — Other Ambulatory Visit: Payer: Self-pay | Admitting: Nurse Practitioner

## 2023-04-23 ENCOUNTER — Other Ambulatory Visit (HOSPITAL_COMMUNITY): Payer: Self-pay

## 2023-04-23 DIAGNOSIS — I509 Heart failure, unspecified: Secondary | ICD-10-CM

## 2023-04-23 NOTE — Telephone Encounter (Signed)
 Patient was given 30 day supply in December 2024.

## 2023-04-24 ENCOUNTER — Other Ambulatory Visit (HOSPITAL_COMMUNITY): Payer: Self-pay

## 2023-04-24 MED ORDER — FUROSEMIDE 40 MG PO TABS
40.0000 mg | ORAL_TABLET | Freq: Every day | ORAL | 0 refills | Status: DC
  Filled 2023-04-24: qty 90, 90d supply, fill #0

## 2023-04-26 ENCOUNTER — Telehealth: Payer: Self-pay

## 2023-04-26 NOTE — Telephone Encounter (Signed)
 Call pt regarding PAP Januvia & Jardiance left a HIPAA VM,pt is due for re-enrollment for 2025.

## 2023-05-07 ENCOUNTER — Ambulatory Visit: Payer: Medicare Other | Admitting: Nurse Practitioner

## 2023-05-18 NOTE — Telephone Encounter (Signed)
Following up on pap [employer] pt a call pt explain he is no longer taking Januvia or Jardiance pt was having side effects and was switch to [place] and does not need any assistance at this time Douglas Soto was $0.

## 2023-06-04 ENCOUNTER — Other Ambulatory Visit: Payer: Self-pay

## 2023-06-04 ENCOUNTER — Telehealth: Payer: Self-pay

## 2023-06-04 ENCOUNTER — Other Ambulatory Visit: Payer: Self-pay | Admitting: Nurse Practitioner

## 2023-06-04 ENCOUNTER — Other Ambulatory Visit (HOSPITAL_COMMUNITY): Payer: Self-pay

## 2023-06-04 DIAGNOSIS — N1832 Chronic kidney disease, stage 3b: Secondary | ICD-10-CM

## 2023-06-04 DIAGNOSIS — I509 Heart failure, unspecified: Secondary | ICD-10-CM

## 2023-06-04 DIAGNOSIS — E1122 Type 2 diabetes mellitus with diabetic chronic kidney disease: Secondary | ICD-10-CM

## 2023-06-04 DIAGNOSIS — I4821 Permanent atrial fibrillation: Secondary | ICD-10-CM

## 2023-06-04 MED ORDER — METOPROLOL SUCCINATE ER 50 MG PO TB24
50.0000 mg | ORAL_TABLET | Freq: Every day | ORAL | 1 refills | Status: DC
  Filled 2023-06-04: qty 90, 90d supply, fill #0
  Filled 2023-08-29: qty 90, 90d supply, fill #1

## 2023-06-04 NOTE — Telephone Encounter (Signed)
 Patient called and stated that he could not afford his blood thinner medication (600 dollars). Patient is requesting an alternative. Please advise

## 2023-06-06 ENCOUNTER — Other Ambulatory Visit (HOSPITAL_COMMUNITY): Payer: Self-pay

## 2023-06-06 NOTE — Telephone Encounter (Signed)
 Patient aware.

## 2023-06-06 NOTE — Telephone Encounter (Signed)
 Referral has been made to pharmacy to help.

## 2023-06-14 ENCOUNTER — Other Ambulatory Visit: Payer: Self-pay

## 2023-06-14 ENCOUNTER — Other Ambulatory Visit (HOSPITAL_COMMUNITY): Payer: Self-pay

## 2023-06-15 ENCOUNTER — Other Ambulatory Visit (HOSPITAL_COMMUNITY): Payer: Self-pay

## 2023-06-19 ENCOUNTER — Other Ambulatory Visit (HOSPITAL_COMMUNITY): Payer: Self-pay

## 2023-06-19 ENCOUNTER — Telehealth: Payer: Self-pay | Admitting: Pharmacist

## 2023-06-19 DIAGNOSIS — E1122 Type 2 diabetes mellitus with diabetic chronic kidney disease: Secondary | ICD-10-CM

## 2023-06-19 NOTE — Progress Notes (Signed)
 06/19/2023 Name: JAMIESON HETLAND MRN: [id number] DOB: 03/03/1941  Chief Complaint  Patient presents with   Medication Assistance    Eliquis, Reubin Bushnell is a 83 y.o. year old male who presented for a telephone visit.   They were referred to the pharmacist by their PCP for assistance in managing medication access. (Eliquis).  Patient is already receiving assistance with Marcelline Deist through [employer].   Subjective:  Care Team: Primary Care Provider: Sharon Seller, NP ; Next Scheduled Visit: 10/08/23  Medication Access/Adherence  Current Pharmacy:  [facility] - [place], [place] - [address] [place] [address] Phone: [phone] Fax: [phone]  [id number] [place], [place] - [address] AT [facility] OF [address] [place] [address] Phone: [phone] Fax: [phone]  [facility] - [address] [place] - [address] [place] [address] Phone: [phone] Fax: [phone]  [place] - [facility] [address] [place] [address] Phone: [phone] Fax: [phone]   Patient reports affordability concerns with their medications: Yes --Patient is receiving Farxiga through [employer].  He reported paying $600 for a one month supply of Eliquis last week.  Patient reports access/transportation concerns to their pharmacy: No  Patient reports adherence concerns with their medications:  No      Diabetes: Current medications:  Farxiga 5 mg 1 tablet daily  A1c-8.2% (controlled for age)   Current medication access support: Music therapist Patient Assistance Does not check home blood sugars  Cardiovascular Meds: Doxazosin 8mg  1 tablet daily Metoprolol Xl 50mg  Olemsartan-amlodipine-HCTZ20-5-12.5 1 tablet daily Rosuvastatin 5 mg 1 tablet daily Eliquis 2.5 mg 1 tablet twice  daily Furosemide 40 mg 1 tablet daily   Objective:  Lab Results  Component Value Date   HGBA1C 8.2 (H) 04/09/2023    Lab Results  Component Value Date   CREATININE 2.21 (H) 04/09/2023   BUN 42 (H) 04/09/2023   NA 140 04/09/2023   K 3.9 04/09/2023   CL 97 (L) 04/09/2023   CO2 35 (H) 04/09/2023    Lab Results  Component Value Date   CHOL 118 04/09/2023   HDL 36 (L) 04/09/2023   LDLCALC 64 04/09/2023   TRIG 98 04/09/2023   CHOLHDL 3.3 04/09/2023    Medications Reviewed Today     Reviewed by Beecher Mcardle, RPH (Pharmacist) on 06/19/23 at 1555  Med List Status: <None>   Medication Order Taking? Sig Documenting Provider Last Dose Status Informant  apixaban (ELIQUIS) 2.5 MG TABS tablet [id number] Yes Take 1 tablet (2.5 mg total) by mouth 2 (two) times daily. Sharon Seller, NP Taking Active            Med Note Littie Deeds, CHERYL A   Thu Sep 28, 2022  9:33 AM)    blood glucose meter kit and supplies [id number] No Dispense based on patient and insurance preference. Use up to four times daily as directed. (FOR ICD-10 E10.9, E11.9).  Patient not taking: Reported on 09/18/2022   Sharon Seller, NP Not Taking Active   colchicine 0.6 MG tablet [id number] Yes TAKE 2 TABLETS BY MOUTH NOW AND  MAY REPEAT 1 TABLET IN AN HOUR  AS DIRECTED Sharon Seller, NP Taking Active   dapagliflozin propanediol (FARXIGA) 5 MG TABS tablet [id number] Yes Take 1 tablet (5 mg  total) by mouth daily before breakfast. Sharon Seller, NP Taking Active   doxazosin (CARDURA) 8 MG tablet [id number] Yes Take 1 tablet (8 mg total) by mouth daily. Sharon Seller, NP Taking Active   furosemide (LASIX) 40 MG tablet [id number] Yes Take 1 tablet (40 mg total) by mouth daily. APPOINTMENT DUE Sharon Seller, NP Taking Active   metoprolol succinate (TOPROL-XL) 50 MG 24 hr tablet [id number] Yes Take 1 tablet (50 mg total) by mouth daily. Sharon Seller, NP Taking Active   Multiple Vitamins-Minerals (CENTRUM  SILVER 50+MEN) TABS [id number] Yes Take 1 tablet by mouth daily. [provider] Taking Active Self  Olmesartan-amLODIPine-HCTZ 20-5-12.5 MG TABS [id number]  Take 1 tablet by mouth daily. Sharon Seller, NP  Active   potassium chloride (KLOR-CON M) 10 MEQ tablet [id number] Yes Take 1 tablet (10 mEq total) by mouth daily. Sharon Seller, NP Taking Active   rosuvastatin (CRESTOR) 5 MG tablet [id number] Yes Take 1 tablet (5 mg total) by mouth daily. Sharon Seller, NP Taking Active               Assessment/Plan:   Assessed Patient for medication assistance programs:  Follow Up Plan:   He is over income for LIS/Extra Help (through [facility]) He is already receiving Farxiga through [employer] He has not met the required out-of-pocket expenditure to get Eliquis through [employer]'s program.  (Has about $300 left) He has a $590 deductible for his insurance. [facility] reported his TROOP (true out of pocket spend) through today as $603.57 Since he has met his deductible, Patient should only have to pay the copay for a tier 3 medication from this point on.   Patient was encouraged to call me it his Eliquis copay was still elevated next month. . IF his copay goes back to $45-$47 it will take six months for his to spend the required 3% of his household income-- or the remaining $275   I will follow up with the Patient in 5 months --August 2025  Beecher Mcardle, PharmD, [facility] Clinical Pharmacist [phone]

## 2023-06-26 ENCOUNTER — Other Ambulatory Visit (HOSPITAL_COMMUNITY): Payer: Self-pay

## 2023-06-26 ENCOUNTER — Other Ambulatory Visit: Payer: Self-pay | Admitting: Nurse Practitioner

## 2023-06-26 ENCOUNTER — Other Ambulatory Visit: Payer: Self-pay

## 2023-06-26 DIAGNOSIS — I10 Essential (primary) hypertension: Secondary | ICD-10-CM

## 2023-06-26 MED ORDER — DOXAZOSIN MESYLATE 8 MG PO TABS
8.0000 mg | ORAL_TABLET | Freq: Every day | ORAL | 1 refills | Status: DC
  Filled 2023-06-26: qty 90, 90d supply, fill #0
  Filled 2023-10-02: qty 90, 90d supply, fill #1

## 2023-06-26 MED ORDER — OLMESARTAN-AMLODIPINE-HCTZ 20-5-12.5 MG PO TABS
1.0000 | ORAL_TABLET | Freq: Every day | ORAL | 2 refills | Status: DC
  Filled 2023-06-26: qty 90, 90d supply, fill #0
  Filled 2023-09-11: qty 90, 90d supply, fill #1
  Filled 2023-12-18: qty 90, 90d supply, fill #2

## 2023-08-01 ENCOUNTER — Other Ambulatory Visit: Payer: Self-pay | Admitting: Nurse Practitioner

## 2023-08-01 ENCOUNTER — Other Ambulatory Visit: Payer: Self-pay

## 2023-08-01 ENCOUNTER — Other Ambulatory Visit (HOSPITAL_COMMUNITY): Payer: Self-pay

## 2023-08-01 DIAGNOSIS — I509 Heart failure, unspecified: Secondary | ICD-10-CM

## 2023-08-01 MED ORDER — POTASSIUM CHLORIDE CRYS ER 10 MEQ PO TBCR
10.0000 meq | EXTENDED_RELEASE_TABLET | Freq: Every day | ORAL | 0 refills | Status: DC
  Filled 2023-08-01: qty 90, 90d supply, fill #0

## 2023-08-01 MED ORDER — FUROSEMIDE 40 MG PO TABS
40.0000 mg | ORAL_TABLET | Freq: Every day | ORAL | 0 refills | Status: DC
  Filled 2023-08-01: qty 90, 90d supply, fill #0

## 2023-08-29 ENCOUNTER — Other Ambulatory Visit (HOSPITAL_COMMUNITY): Payer: Self-pay

## 2023-09-11 ENCOUNTER — Other Ambulatory Visit: Payer: Self-pay

## 2023-09-11 ENCOUNTER — Other Ambulatory Visit: Payer: Self-pay | Admitting: Nurse Practitioner

## 2023-09-11 ENCOUNTER — Other Ambulatory Visit (HOSPITAL_COMMUNITY): Payer: Self-pay

## 2023-09-11 DIAGNOSIS — I4821 Permanent atrial fibrillation: Secondary | ICD-10-CM

## 2023-09-11 MED ORDER — APIXABAN 2.5 MG PO TABS
2.5000 mg | ORAL_TABLET | Freq: Two times a day (BID) | ORAL | 1 refills | Status: DC
  Filled 2023-09-11: qty 180, 90d supply, fill #0

## 2023-10-02 ENCOUNTER — Other Ambulatory Visit (HOSPITAL_COMMUNITY): Payer: Self-pay

## 2023-10-08 ENCOUNTER — Ambulatory Visit (INDEPENDENT_AMBULATORY_CARE_PROVIDER_SITE_OTHER): Payer: Medicare Other | Admitting: Nurse Practitioner

## 2023-10-08 ENCOUNTER — Encounter: Payer: Self-pay | Admitting: Nurse Practitioner

## 2023-10-08 VITALS — BP 138/78 | HR 70 | Temp 97.9°F | Ht 68.0 in | Wt 220.2 lb

## 2023-10-08 DIAGNOSIS — I4821 Permanent atrial fibrillation: Secondary | ICD-10-CM | POA: Diagnosis not present

## 2023-10-08 DIAGNOSIS — I509 Heart failure, unspecified: Secondary | ICD-10-CM | POA: Diagnosis not present

## 2023-10-08 DIAGNOSIS — M1A9XX0 Chronic gout, unspecified, without tophus (tophi): Secondary | ICD-10-CM | POA: Diagnosis not present

## 2023-10-08 DIAGNOSIS — I10 Essential (primary) hypertension: Secondary | ICD-10-CM | POA: Diagnosis not present

## 2023-10-08 DIAGNOSIS — Z6833 Body mass index (BMI) 33.0-33.9, adult: Secondary | ICD-10-CM | POA: Insufficient documentation

## 2023-10-08 DIAGNOSIS — E1122 Type 2 diabetes mellitus with diabetic chronic kidney disease: Secondary | ICD-10-CM | POA: Diagnosis not present

## 2023-10-08 DIAGNOSIS — E7800 Pure hypercholesterolemia, unspecified: Secondary | ICD-10-CM | POA: Diagnosis not present

## 2023-10-08 DIAGNOSIS — E6609 Other obesity due to excess calories: Secondary | ICD-10-CM

## 2023-10-08 DIAGNOSIS — N1832 Chronic kidney disease, stage 3b: Secondary | ICD-10-CM | POA: Diagnosis not present

## 2023-10-08 DIAGNOSIS — E66811 Obesity, class 1: Secondary | ICD-10-CM

## 2023-10-08 NOTE — Assessment & Plan Note (Signed)
 Reports he has not had a flare, uric acid was 10 on last check but has not taken anything routinely for preventative.

## 2023-10-08 NOTE — Assessment & Plan Note (Signed)
 Not controlled, against multiple medication Will follow up A1c today and have him back to discuss medication options as he is not available by phone and reports he does not answer- after last labs unable to leave a VM either - discussed with the patient the pathophysiology of diabetes and the natural progression of the disease.  -stressed the importance of lifestyle changes including diet and exercise. -discussed complications associated with diabetes including retinopathy, nephropathy, neuropathy as well as increased risk of cardiovascular disease. We went over the benefit seen with glycemic control.  He understand the progression of dementia but would rather not have side effects from medication at this time.

## 2023-10-08 NOTE — Assessment & Plan Note (Signed)
-  education provided on healthy weight loss through increase in physical activity and proper nutrition

## 2023-10-08 NOTE — Progress Notes (Signed)
 Careteam: Patient Care Team: Caro Harlene POUR, NP as PCP - General (Geriatric Medicine) Shlomo Wilbert SAUNDERS, MD as PCP - Cardiology (Cardiology)  PLACE OF SERVICE:  [facility]  Advanced Directive information    Allergies  Allergen Reactions   Allopurinol  Other (See Comments)    Welts in the body and and blisters in the mouth   Jardiance  [Empagliflozin ] Itching    Itching and joint pain     Chief Complaint  Patient presents with   Follow-up    6 month follow up    HPI:  Discussed the use of AI scribe software for clinical note transcription with the patient, who gave verbal consent to proceed.  History of Present Illness Douglas Soto is an 83 year old male with diabetes and stage four kidney disease who presents for a six-month follow-up and blood work.  He was last seen in December, with a previously noted high A1c of 8.2. He is not currently taking any diabetes medications, including Farxiga , due to adverse effects such as burning sensations and skin reactions. He has tried multiple medications including Jardiance  and Januvia , but discontinued them due to similar side effects. He has not made significant dietary changes and has gained 20 pounds since his last visit.  He is currently taking medications for blood pressure, cholesterol, and blood thinning, specifically Eliquis  twice a day. He also takes potassium occasionally. He reports no current medication for gout, although his uric acid level was high at the last check. He has not experienced any recent gout flares.  His kidney function has worsened according to recent lab results does not want to go to another doctor due to this.   No shortness of breath, chest pain, rashes, constipation, changes in urination, numbness, tingling, palpitations, or irregular heartbeats.  He prefers a diet high in meat and low in grains, acknowledging that dietary changes could help his condition but expressing reluctance to eat 'rabbit  food'. He is not interested in frequent doctor visits, emphasizing a desire for quality of life over extensive medical intervention.   Review of Systems:  Review of Systems  Constitutional:  Negative for chills, fever and weight loss.  HENT:  Negative for tinnitus.   Respiratory:  Negative for cough, sputum production and shortness of breath.   Cardiovascular:  Negative for chest pain, palpitations and leg swelling.  Gastrointestinal:  Negative for abdominal pain, constipation, diarrhea and heartburn.  Genitourinary:  Negative for dysuria, frequency and urgency.  Musculoskeletal:  Negative for back pain, falls, joint pain and myalgias.  Skin: Negative.   Neurological:  Negative for dizziness and headaches.  Psychiatric/Behavioral:  Negative for depression and memory loss. The patient does not have insomnia.     Past Medical History:  Diagnosis Date   Cholelithiasis 02/2018   uncomplicated.     Chronic kidney disease, stage II (mild)    Depression    Diabetes mellitus without complication (HCC)    Essential hypertension, malignant    Fatty liver 02/2018   on ultrasound   Gout, unspecified    Hyperlipidemia    Lumbago    Obesity, unspecified    Pain in joint, pelvic region and thigh    Pleural effusion on right 02/2018   Past Surgical History:  Procedure Laterality Date   APPENDECTOMY     COLONOSCOPY WITH PROPOFOL  N/A 02/22/2018   Procedure: COLONOSCOPY WITH PROPOFOL ;  Surgeon: Teressa Toribio SQUIBB, MD;  Location: [facility] ENDOSCOPY;  Service: Endoscopy;  Laterality: N/A;   CYST  EXCISION  1998   back   ESOPHAGOGASTRODUODENOSCOPY (EGD) WITH PROPOFOL  N/A 02/22/2018   Procedure: ESOPHAGOGASTRODUODENOSCOPY (EGD) WITH PROPOFOL ;  Surgeon: Teressa Toribio SQUIBB, MD;  Location: [facility] ENDOSCOPY;  Service: Endoscopy;  Laterality: N/A;   POLYPECTOMY  02/22/2018   Procedure: POLYPECTOMY;  Surgeon: Teressa Toribio SQUIBB, MD;  Location[facility] ENDOSCOPY;  Service: Endoscopy;;   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 His smokeless tobacco use includes chew. He reports current alcohol use of about 1.0 standard drink of alcohol per week. He reports that he does not use drugs.  Family History  Problem Relation Age of Onset   Heart Problems Father    Heart disease Brother    Diabetes Brother    Diabetes Brother    Heart Problems Mother    Pneumonia Maternal Grandfather     Medications: Patient's Medications  New Prescriptions   No medications on file  Previous Medications   APIXABAN  (ELIQUIS ) 2.5 MG TABS TABLET    Take 1 tablet (2.5 mg total) by mouth 2 (two) times daily.   BLOOD GLUCOSE METER KIT AND SUPPLIES    Dispense based on patient and insurance preference. Use up to four times daily as directed. (FOR ICD-10 E10.9, E11.9).   COLCHICINE  0.6 MG TABLET    TAKE 2 TABLETS BY MOUTH NOW AND  MAY REPEAT 1 TABLET IN AN HOUR  AS DIRECTED   DAPAGLIFLOZIN  PROPANEDIOL (FARXIGA ) 5 MG TABS TABLET    Take 1 tablet (5 mg total) by mouth daily before breakfast.   DOXAZOSIN  (CARDURA ) 8 MG TABLET    Take 1 tablet (8 mg total) by mouth daily.   FUROSEMIDE  (LASIX ) 40 MG TABLET    Take 1 tablet (40 mg total) by mouth daily. APPOINTMENT DUE   METOPROLOL  SUCCINATE (TOPROL -XL) 50 MG 24 HR TABLET    Take 1 tablet (50 mg total) by mouth daily.   MULTIPLE VITAMINS-MINERALS (CENTRUM SILVER 50+MEN) TABS    Take 1 tablet by mouth daily.   OLMESARTAN -AMLODIPINE -HCTZ 20-5-12.5 MG TABS    Take 1 tablet by mouth daily.   POTASSIUM CHLORIDE  (KLOR-CON  M) 10 MEQ TABLET    Take 1 tablet (10 mEq total) by mouth daily.   ROSUVASTATIN  (CRESTOR ) 5 MG TABLET    Take 1 tablet (5 mg total) by mouth daily.  Modified Medications   No medications on file  Discontinued Medications   No medications on file    Physical Exam:  Vitals:   10/08/23 1254 10/08/23 1342  BP: (!) 140/80 138/78  Pulse: 70   Temp: 97.9 F (36.6 C)   SpO2: 97%   Weight: 220 lb 3.2  oz (99.9 kg)   Height: 5' 8 (1.727 m)    Body mass index is 33.48 kg/m. Wt Readings from Last 3 Encounters:  10/08/23 220 lb 3.2 oz (99.9 kg)  04/09/23 206 lb (93.4 kg)  08/30/22 203 lb 3.2 oz (92.2 kg)    Physical Exam Constitutional:      General: He is not in acute distress.    Appearance: He is well-developed. He is not diaphoretic.  HENT:     Head: Normocephalic and atraumatic.     Right Ear: External ear normal.     Left Ear: External ear normal.     Mouth/Throat:     Pharynx: No oropharyngeal exudate.   Eyes:     Conjunctiva/sclera: Conjunctivae normal.     Pupils: Pupils are  equal, round, and reactive to light.    Cardiovascular:     Rate and Rhythm: Normal rate and regular rhythm.     Heart sounds: Normal heart sounds.  Pulmonary:     Effort: Pulmonary effort is normal.     Breath sounds: Normal breath sounds.  Abdominal:     General: Bowel sounds are normal.     Palpations: Abdomen is soft.   Musculoskeletal:        General: No tenderness.     Cervical back: Normal range of motion and neck supple.     Right lower leg: No edema.     Left lower leg: No edema.   Skin:    General: Skin is warm and dry.   Neurological:     Mental Status: He is alert and oriented to person, place, and time.     Labs reviewed: Basic Metabolic Panel: Recent Labs    04/09/23 1126  NA 140  K 3.9  CL 97*  CO2 35*  GLUCOSE 209*  BUN 42*  CREATININE 2.21*  CALCIUM  10.1   Liver Function Tests: Recent Labs    04/09/23 1126  AST 10  ALT 9  BILITOT 0.6  PROT 6.7   No results for input(s): LIPASE, AMYLASE in the last 8760 hours. No results for input(s): AMMONIA in the last 8760 hours. CBC: Recent Labs    04/09/23 1126  WBC 8.1  NEUTROABS 5,346  HGB 12.9*  HCT 38.9  MCV 88.8  PLT 244   Lipid Panel: Recent Labs    04/09/23 1126  CHOL 118  HDL 36*  LDLCALC 64  TRIG 98  CHOLHDL 3.3   TSH: No results for input(s): TSH in the last 8760  hours. A1C: Lab Results  Component Value Date   HGBA1C 8.2 (H) 04/09/2023     Assessment/Plan Congestive heart failure, unspecified HF chronicity, unspecified heart failure type [facility]) Assessment & Plan: Euvolemic, continues on olmesaratan-amlodipine -hydrochlorothiazide     Type 2 diabetes mellitus with stage 3b chronic kidney disease, without long-term current use of insulin  (HCC) Assessment & Plan: Not controlled, against multiple medication Will follow up A1c today and have him back to discuss medication options as he is not available by phone and reports he does not answer- after last labs unable to leave a VM either - discussed with the patient the pathophysiology of diabetes and the natural progression of the disease.  -stressed the importance of lifestyle changes including diet and exercise. -discussed complications associated with diabetes including retinopathy, nephropathy, neuropathy as well as increased risk of cardiovascular disease. We went over the benefit seen with glycemic control.  He understand the progression of dementia but would rather not have side effects from medication at this time.    Orders: -     Microalbumin / creatinine urine ratio -     Hemoglobin A1c -     COMPLETE METABOLIC PANEL WITHOUT GFR  Stage 3b chronic kidney disease (HCC) Assessment & Plan: Elevated Cr on last labs, will follow up today but does not wish to go to nephrologist.   Orders: -     COMPLETE METABOLIC PANEL WITHOUT GFR  Permanent atrial fibrillation (HCC) Assessment & Plan: Rate controlled on metoprolol   Continues on eliquis  for anticoagulation   Orders: -     CBC with Differential/Platelet  Essential hypertension Assessment & Plan: Blood pressure well controlled, goal bp <140/90 Continue current medications and dietary modifications follow metabolic panel   Orders: -     COMPLETE  METABOLIC PANEL WITHOUT GFR  Pure hypercholesterolemia Assessment & Plan: On  crestor .   Orders: -     Lipid panel  Chronic gout without tophus, unspecified cause, unspecified site Assessment & Plan: Reports he has not had a flare, uric acid was 10 on last check but has not taken anything routinely for preventative.   Orders: -     Uric acid  Class 1 obesity due to excess calories with serious comorbidity and body mass index (BMI) of 33.0 to 33.9 in adult Assessment & Plan: -education provided on healthy weight loss through increase in physical activity and proper nutrition       Follow up in 4 days to discuss labs  Vanecia Limpert K. Caro BODILY [facility] [phone]

## 2023-10-08 NOTE — Assessment & Plan Note (Signed)
 Elevated Cr on last labs, will follow up today but does not wish to go to nephrologist.

## 2023-10-08 NOTE — Assessment & Plan Note (Signed)
 On crestor.

## 2023-10-08 NOTE — Assessment & Plan Note (Signed)
 Blood pressure well controlled, goal bp <140/90 Continue current medications and dietary modifications follow metabolic panel

## 2023-10-08 NOTE — Assessment & Plan Note (Signed)
 Rate controlled on metoprolol  Continues on eliquis for anticoagulation

## 2023-10-08 NOTE — Assessment & Plan Note (Signed)
 Euvolemic, continues on olmesaratan-amlodipine -hydrochlorothiazide 

## 2023-10-09 ENCOUNTER — Ambulatory Visit: Payer: Self-pay | Admitting: Nurse Practitioner

## 2023-10-09 DIAGNOSIS — E1122 Type 2 diabetes mellitus with diabetic chronic kidney disease: Secondary | ICD-10-CM

## 2023-10-09 LAB — COMPLETE METABOLIC PANEL WITHOUT GFR
AG Ratio: 1.5 (calc) (ref 1.0–2.5)
ALT: 6 U/L — ABNORMAL LOW (ref 9–46)
AST: 11 U/L (ref 10–35)
Albumin: 3.8 g/dL (ref 3.6–5.1)
Alkaline phosphatase (APISO): 82 U/L (ref 35–144)
BUN/Creatinine Ratio: 20 (calc) (ref 6–22)
BUN: 44 mg/dL — ABNORMAL HIGH (ref 7–25)
CO2: 31 mmol/L (ref 20–32)
Calcium: 9.7 mg/dL (ref 8.6–10.3)
Chloride: 98 mmol/L (ref 98–110)
Creat: 2.18 mg/dL — ABNORMAL HIGH (ref 0.70–1.22)
Globulin: 2.5 g/dL (ref 1.9–3.7)
Glucose, Bld: 242 mg/dL — ABNORMAL HIGH (ref 65–99)
Potassium: 3.5 mmol/L (ref 3.5–5.3)
Sodium: 138 mmol/L (ref 135–146)
Total Bilirubin: 0.6 mg/dL (ref 0.2–1.2)
Total Protein: 6.3 g/dL (ref 6.1–8.1)

## 2023-10-09 LAB — URIC ACID: Uric Acid, Serum: 12.3 mg/dL — ABNORMAL HIGH (ref 4.0–8.0)

## 2023-10-11 LAB — CBC WITH DIFFERENTIAL/PLATELET
Absolute Lymphocytes: 1427 {cells}/uL (ref 850–3900)
Absolute Monocytes: 585 {cells}/uL (ref 200–950)
Basophils Absolute: 47 {cells}/uL (ref 0–200)
Basophils Relative: 0.6 %
Eosinophils Absolute: 218 {cells}/uL (ref 15–500)
Eosinophils Relative: 2.8 %
HCT: 39.1 % (ref 38.5–50.0)
Hemoglobin: 12.4 g/dL — ABNORMAL LOW (ref 13.2–17.1)
MCH: 28.4 pg (ref 27.0–33.0)
MCHC: 31.7 g/dL — ABNORMAL LOW (ref 32.0–36.0)
MCV: 89.7 fL (ref 80.0–100.0)
MPV: 9.8 fL (ref 7.5–12.5)
Monocytes Relative: 7.5 %
Neutro Abs: 5522 {cells}/uL (ref 1500–7800)
Neutrophils Relative %: 70.8 %
Platelets: 190 10*3/uL (ref 140–400)
RBC: 4.36 10*6/uL (ref 4.20–5.80)
RDW: 13.2 % (ref 11.0–15.0)
Total Lymphocyte: 18.3 %
WBC: 7.8 10*3/uL (ref 3.8–10.8)

## 2023-10-11 LAB — TEST AUTHORIZATION

## 2023-10-11 LAB — COMPREHENSIVE METABOLIC PANEL WITH GFR
AG Ratio: 1.5 (calc) (ref 1.0–2.5)
ALT: 6 U/L — ABNORMAL LOW (ref 9–46)
AST: 11 U/L (ref 10–35)
Albumin: 3.8 g/dL (ref 3.6–5.1)
Alkaline phosphatase (APISO): 82 U/L (ref 35–144)
BUN/Creatinine Ratio: 20 (calc) (ref 6–22)
BUN: 44 mg/dL — ABNORMAL HIGH (ref 7–25)
CO2: 31 mmol/L (ref 20–32)
Calcium: 9.7 mg/dL (ref 8.6–10.3)
Chloride: 98 mmol/L (ref 98–110)
Creat: 2.18 mg/dL — ABNORMAL HIGH (ref 0.70–1.22)
Globulin: 2.5 g/dL (ref 1.9–3.7)
Glucose, Bld: 242 mg/dL — ABNORMAL HIGH (ref 65–99)
Potassium: 3.5 mmol/L (ref 3.5–5.3)
Sodium: 138 mmol/L (ref 135–146)
Total Bilirubin: 0.6 mg/dL (ref 0.2–1.2)
Total Protein: 6.3 g/dL (ref 6.1–8.1)
eGFR: 30 mL/min/{1.73_m2} — ABNORMAL LOW (ref >=60)

## 2023-10-11 LAB — LIPID PANEL
Cholesterol: 122 mg/dL (ref <200)
HDL: 39 mg/dL — ABNORMAL LOW (ref >=40)
LDL Cholesterol (Calc): 63 mg/dL
Non-HDL Cholesterol (Calc): 83 mg/dL (ref <130)
Total CHOL/HDL Ratio: 3.1 (calc) (ref <5.0)
Triglycerides: 114 mg/dL (ref <150)

## 2023-10-11 LAB — HEMOGLOBIN A1C
Hgb A1c MFr Bld: 9.7 % — ABNORMAL HIGH (ref <5.7)
Mean Plasma Glucose: 232 mg/dL
eAG (mmol/L): 12.8 mmol/L

## 2023-10-12 ENCOUNTER — Encounter: Admitting: Nurse Practitioner

## 2023-10-12 NOTE — Progress Notes (Signed)
 This encounter was created in error - please disregard.

## 2023-10-18 ENCOUNTER — Telehealth: Payer: Self-pay | Admitting: Pharmacist

## 2023-10-18 DIAGNOSIS — E1165 Type 2 diabetes mellitus with hyperglycemia: Secondary | ICD-10-CM

## 2023-10-18 NOTE — Telephone Encounter (Signed)
 Patient returning call, transferred to [place].

## 2023-10-18 NOTE — Progress Notes (Addendum)
   10/18/2023  Patient ID: Douglas Soto, male   DOB: May 28, 1940, 83 y.o.   MRN: [id number]  Called Patient per referral for medication adherence. Unfortunately, he did not answer his phone. HIPAA compliant message was left on his voicemail.  Plan: Call patient back in 3-4 business days.   Douglas Soto, PharmD, BCACP Clinical Pharmacist [phone]   ADDENDUM-  Patient called me back. HIPAA identifiers were obtained.    Medications Reviewed Today     Reviewed by Soto Douglas PARAS, [facility] (Pharmacist) on 10/18/23 at 1653  Med List Status: <None>   Medication Order Taking? Sig Documenting Provider Last Dose Status Informant  apixaban  (ELIQUIS ) 2.5 MG TABS tablet [id number] Yes Take 1 tablet (2.5 mg total) by mouth 2 (two) times daily. Caro Harlene POUR, NP  Active   blood glucose meter kit and supplies [id number]  Dispense based on patient and insurance preference. Use up to four times daily as directed. (FOR ICD-10 E10.9, E11.9).  Patient not taking: Reported on 10/08/2023   Caro Harlene POUR, NP  Active   colchicine  0.6 MG tablet [id number] Yes TAKE 2 TABLETS BY MOUTH NOW AND  MAY REPEAT 1 TABLET IN AN HOUR  AS DIRECTED Eubanks, Jessica K, NP  Active   dapagliflozin  propanediol (FARXIGA ) 5 MG TABS tablet [id number]  Take 1 tablet (5 mg total) by mouth daily before breakfast.  Patient not taking: Reported on 10/08/2023   Eubanks, Jessica K, NP  Active   doxazosin  (CARDURA ) 8 MG tablet [id number] Yes Take 1 tablet (8 mg total) by mouth daily. Caro Harlene POUR, NP  Active   furosemide  (LASIX ) 40 MG tablet [id number] Yes Take 1 tablet (40 mg total) by mouth daily. APPOINTMENT DUE Eubanks, Jessica K, NP  Active   metoprolol  succinate (TOPROL -XL) 50 MG 24 hr tablet [id number] Yes Take 1 tablet (50 mg total) by mouth daily. Caro Harlene POUR, NP  Active   Multiple Vitamins-Minerals (CENTRUM SILVER 50+MEN) TABS [id number] Yes Take 1 tablet by mouth daily. [provider]  Active Self   Olmesartan -amLODIPine -HCTZ 20-5-12.5 MG TABS [id number] Yes Take 1 tablet by mouth daily. Caro Harlene POUR, NP  Active   potassium chloride  (KLOR-CON  M) 10 MEQ tablet [id number] Yes Take 1 tablet (10 mEq total) by mouth daily. Caro Harlene POUR, NP  Active   rosuvastatin  (CRESTOR ) 5 MG tablet [id number] Yes Take 1 tablet (5 mg total) by mouth daily. Caro Harlene POUR, NP  Active

## 2023-10-18 NOTE — Progress Notes (Incomplete Revision)
   10/18/2023  Patient ID: Douglas Soto [place], male   DOB: 07-25-1940, 83 y.o.   MRN: [id number]  Called Patient per referral for medication adherence. Unfortunately, he did not answer his phone. HIPAA compliant message was left on his voicemail.  Plan: Call patient back in 3-4 business days.   [name], PharmD, BCACP Clinical Pharmacist [phone]   ADDENDUM-

## 2023-10-26 ENCOUNTER — Telehealth: Payer: Self-pay

## 2023-10-26 ENCOUNTER — Telehealth: Payer: Self-pay | Admitting: Pharmacist

## 2023-10-26 DIAGNOSIS — E1122 Type 2 diabetes mellitus with diabetic chronic kidney disease: Secondary | ICD-10-CM

## 2023-10-26 NOTE — Telephone Encounter (Signed)
 Copied from CRM [id number]. Topic: Clinical - Lab/Test Results >> Oct 25, 2023  1:17 PM Carmell SAUNDERS wrote: Reason for CRM: Pt and daughter Laneta called back to speak with Laneta about results and appt. Follow up appt was scheduled, but pt says he wants blood work done beforehand. Please contact [name]. [phone] or [phone].

## 2023-10-26 NOTE — Telephone Encounter (Signed)
 Message routed to [facility].B/CMA to follow up.

## 2023-10-26 NOTE — Telephone Encounter (Signed)
 Left message on voicemail for patient to return call when available

## 2023-10-26 NOTE — Progress Notes (Signed)
   10/26/2023  Patient ID: Douglas Soto, male   DOB: 01/22/1941, 83 y.o.   MRN: [id number]  Patient was called to follow up on diabetes management. Unfortunately, he did not answer his phone. HIPAA compliant message was left on his voicemail with a request for a call back.  Plan: Call Patient back in 3-5 business days  HgA1c-9.7% Patient reported not taking Farxiga  due to a neck rash.  After further conversation, he may be a candidate for Ozempic.  Medication Adherence Measure review:  On Rosuvastatin  5 mg filled 06/26/23 for a 90 day supply-needs a refill On Olmesartan -amlodipine -Hydrochlorothiazide  20-5-12.5 filled 09/11/23 for a 90 day supply (compliant)  Douglas Soto, PharmD, BCACP Clinical Pharmacist [phone]

## 2023-10-29 ENCOUNTER — Telehealth: Payer: Self-pay | Admitting: Pharmacist

## 2023-10-29 DIAGNOSIS — E1165 Type 2 diabetes mellitus with hyperglycemia: Secondary | ICD-10-CM

## 2023-10-29 NOTE — Progress Notes (Signed)
   10/29/2023  Patient ID: Douglas Soto, male   DOB: Jun 19, 1940, 83 y.o.   MRN: [id number]  Called Patient to follow up on medication management of diabetes. Unfortunately, he did not answer his phone HIPAA compliant message was left on his voicemail.  Plan: Call Patient back in 3-5 business days.   [name], PharmD, BCACP Clinical Pharmacist [phone]

## 2023-11-05 ENCOUNTER — Telehealth: Payer: Self-pay | Admitting: Pharmacist

## 2023-11-05 NOTE — Progress Notes (Signed)
   11/05/2023  Patient ID: Douglas Soto, male   DOB: 10-Mar-1941, 83 y.o.   MRN: [id number]   Patient was called for diabetes management per referral. Unfortunately, He did not answer his phone. HIPAA compliant message was left on his voicemail.    Plan: Call Patient back in 2 weeks.   [name], PharmD, BCACP Clinical Pharmacist [phone]

## 2023-11-16 ENCOUNTER — Telehealth: Payer: Self-pay | Admitting: Pharmacist

## 2023-11-16 DIAGNOSIS — E1165 Type 2 diabetes mellitus with hyperglycemia: Secondary | ICD-10-CM

## 2023-11-16 NOTE — Progress Notes (Signed)
   11/16/2023  Patient ID: Douglas Soto, male   DOB: 01/09/1941, 83 y.o.   MRN: [id number]  Patient was called to follow up on diabetes management. Unfortunately, He did not answer the phone. HIPAA compliant message was leftt on his voicemail.  Plan: Call Patient back in 2-3 weeks.   [name], PharmD, BCACP Clinical Pharmacist [phone]

## 2023-11-19 ENCOUNTER — Other Ambulatory Visit: Payer: Self-pay | Admitting: Nurse Practitioner

## 2023-11-19 ENCOUNTER — Other Ambulatory Visit (HOSPITAL_COMMUNITY): Payer: Self-pay

## 2023-11-19 ENCOUNTER — Other Ambulatory Visit: Payer: Self-pay

## 2023-11-19 DIAGNOSIS — I509 Heart failure, unspecified: Secondary | ICD-10-CM

## 2023-11-19 MED ORDER — POTASSIUM CHLORIDE CRYS ER 10 MEQ PO TBCR
10.0000 meq | EXTENDED_RELEASE_TABLET | Freq: Every day | ORAL | 0 refills | Status: DC
  Filled 2023-11-19: qty 90, 90d supply, fill #0

## 2023-11-19 MED ORDER — FUROSEMIDE 40 MG PO TABS
40.0000 mg | ORAL_TABLET | Freq: Every day | ORAL | 0 refills | Status: DC
  Filled 2023-11-19: qty 90, 90d supply, fill #0

## 2023-11-19 MED ORDER — METOPROLOL SUCCINATE ER 50 MG PO TB24
50.0000 mg | ORAL_TABLET | Freq: Every day | ORAL | 0 refills | Status: DC
  Filled 2023-11-19: qty 90, 90d supply, fill #0

## 2023-11-19 NOTE — Telephone Encounter (Signed)
 Patient last contacted by [facility].B/CMA to follow up. Upcoming appointment with PCP Caro Harlene POUR, NP 01/14/2024.

## 2023-11-21 ENCOUNTER — Telehealth: Payer: Self-pay | Admitting: Pharmacist

## 2023-11-21 NOTE — Progress Notes (Signed)
   11/21/2023  Patient ID: Douglas Soto, male   DOB: [birth year], 83 y.o.   MRN: [id number]  Patient was called to follow up on blood sugars and get his out-of-pocket expenditure to assess for Eliquis  Patient Assistance. Unfortunately, He did not answer the phone. HIPAA compliant message was left on his voicemail.  Plan: Call Patient back in 1-2 weeks.   [name], PharmD, BCACP Clinical Pharmacist [phone]

## 2023-12-05 ENCOUNTER — Other Ambulatory Visit (HOSPITAL_COMMUNITY): Payer: Self-pay

## 2023-12-06 ENCOUNTER — Telehealth: Payer: Self-pay | Admitting: Pharmacist

## 2023-12-06 DIAGNOSIS — Z79899 Other long term (current) drug therapy: Secondary | ICD-10-CM

## 2023-12-06 NOTE — Progress Notes (Signed)
   12/06/2023  Patient ID: Douglas Soto, male   DOB: 1940-09-11, 83 y.o.   MRN: [id number]  Patient was called regarding medication assistance with Eliquis  and diabetes mangaement.  Patient did not answer the phone. HIPAA compliant message was left on his voicemail.  I spoke  with the patient a few weeks ago and he was supposed to be getting his TROOP or true out of pocket expenditure for medications from his estimation of benefits for me to review as the program for Eliquis  requires Patients to spend at least 3% of their income on medication expenses.  HgA1c-9.7%  Plan:  Call patient back in 2-3 weeks as I have been unsuccessful at reaching him.   [name], PharmD, BCACP Clinical Pharmacist [phone]

## 2023-12-18 ENCOUNTER — Other Ambulatory Visit: Payer: Self-pay

## 2023-12-18 ENCOUNTER — Other Ambulatory Visit (HOSPITAL_COMMUNITY): Payer: Self-pay

## 2024-01-01 ENCOUNTER — Telehealth: Payer: Self-pay | Admitting: Pharmacy Technician

## 2024-01-01 NOTE — Progress Notes (Signed)
   01/01/2024 Name: Douglas Soto MRN: [id number] DOB: 07/09/1940  Patient engaged with clinical pharmacist for management of diabetes on 10/18/2023. Outreach by [employer] technician was requested.   Outreached patient to discuss diabetes medication management. Left voicemail for patient to return my call at their convenience.    Jovita Persing, CPhT [facility] Office: [phone] Email: [email or web address]

## 2024-01-02 ENCOUNTER — Other Ambulatory Visit: Payer: Self-pay

## 2024-01-02 ENCOUNTER — Other Ambulatory Visit: Payer: Self-pay | Admitting: Nurse Practitioner

## 2024-01-02 ENCOUNTER — Other Ambulatory Visit (HOSPITAL_COMMUNITY): Payer: Self-pay

## 2024-01-02 DIAGNOSIS — I10 Essential (primary) hypertension: Secondary | ICD-10-CM

## 2024-01-02 MED ORDER — DOXAZOSIN MESYLATE 8 MG PO TABS
8.0000 mg | ORAL_TABLET | Freq: Every day | ORAL | 1 refills | Status: DC
  Filled 2024-01-02: qty 90, 90d supply, fill #0
  Filled 2024-03-27: qty 90, 90d supply, fill #1

## 2024-01-03 ENCOUNTER — Telehealth: Payer: Self-pay | Admitting: Pharmacy Technician

## 2024-01-03 NOTE — Progress Notes (Signed)
   01/03/2024  Patient ID: Douglas Soto, male   DOB: 1940/10/25, 83 y.o.   MRN: [id number]  Patient engaged with clinical pharmacist for management of diabetes on 10/18/2023. Outreach by [employer] technician was requested.   Outreached patient to discuss diabetes medication management. Left voicemail for patient to return my call at their convenience.    Jarom Govan, CPhT [facility] Office: [phone] Email: [email or web address]

## 2024-01-10 ENCOUNTER — Other Ambulatory Visit

## 2024-01-14 ENCOUNTER — Ambulatory Visit: Admitting: Nurse Practitioner

## 2024-01-31 ENCOUNTER — Other Ambulatory Visit (HOSPITAL_COMMUNITY): Payer: Self-pay

## 2024-01-31 ENCOUNTER — Other Ambulatory Visit: Payer: Self-pay

## 2024-01-31 ENCOUNTER — Other Ambulatory Visit: Payer: Self-pay | Admitting: Nurse Practitioner

## 2024-01-31 DIAGNOSIS — E7800 Pure hypercholesterolemia, unspecified: Secondary | ICD-10-CM

## 2024-01-31 MED ORDER — ROSUVASTATIN CALCIUM 5 MG PO TABS
5.0000 mg | ORAL_TABLET | Freq: Every day | ORAL | 1 refills | Status: DC
  Filled 2024-01-31: qty 90, 90d supply, fill #0

## 2024-02-12 NOTE — Progress Notes (Signed)
 Douglas Soto                                          MRN: [id number]   02/12/2024   The VBCI Quality Team Specialist reviewed this patient medical record for the purposes of chart review for care gap closure. The following were reviewed: chart review for care gap closure-kidney health evaluation for diabetes:eGFR  and uACR.    VBCI Quality Team

## 2024-02-27 ENCOUNTER — Other Ambulatory Visit: Payer: Self-pay

## 2024-02-27 ENCOUNTER — Other Ambulatory Visit (HOSPITAL_COMMUNITY): Payer: Self-pay

## 2024-02-27 ENCOUNTER — Other Ambulatory Visit: Payer: Self-pay | Admitting: Nurse Practitioner

## 2024-02-27 DIAGNOSIS — I10 Essential (primary) hypertension: Secondary | ICD-10-CM

## 2024-02-27 DIAGNOSIS — I509 Heart failure, unspecified: Secondary | ICD-10-CM

## 2024-02-27 MED ORDER — OLMESARTAN-AMLODIPINE-HCTZ 20-5-12.5 MG PO TABS
1.0000 | ORAL_TABLET | Freq: Every day | ORAL | 2 refills | Status: DC
  Filled 2024-02-27: qty 90, 90d supply, fill #0

## 2024-02-27 MED ORDER — FUROSEMIDE 40 MG PO TABS
40.0000 mg | ORAL_TABLET | Freq: Every day | ORAL | 0 refills | Status: DC
  Filled 2024-02-27: qty 90, 90d supply, fill #0

## 2024-02-27 MED ORDER — METOPROLOL SUCCINATE ER 50 MG PO TB24
50.0000 mg | ORAL_TABLET | Freq: Every day | ORAL | 0 refills | Status: DC
  Filled 2024-02-27: qty 90, 90d supply, fill #0

## 2024-03-27 ENCOUNTER — Other Ambulatory Visit (HOSPITAL_COMMUNITY): Payer: Self-pay

## 2024-03-27 ENCOUNTER — Other Ambulatory Visit: Payer: Self-pay

## 2024-04-14 ENCOUNTER — Emergency Department (HOSPITAL_COMMUNITY)

## 2024-04-14 ENCOUNTER — Inpatient Hospital Stay (HOSPITAL_COMMUNITY): Admission: EM | Admit: 2024-04-14 | Discharge: 2024-05-18 | DRG: 291 | Disposition: E

## 2024-04-14 DIAGNOSIS — E1122 Type 2 diabetes mellitus with diabetic chronic kidney disease: Secondary | ICD-10-CM | POA: Diagnosis present

## 2024-04-14 DIAGNOSIS — E440 Moderate protein-calorie malnutrition: Secondary | ICD-10-CM | POA: Diagnosis present

## 2024-04-14 DIAGNOSIS — J9601 Acute respiratory failure with hypoxia: Principal | ICD-10-CM | POA: Diagnosis present

## 2024-04-14 DIAGNOSIS — N189 Chronic kidney disease, unspecified: Secondary | ICD-10-CM | POA: Diagnosis not present

## 2024-04-14 DIAGNOSIS — N179 Acute kidney failure, unspecified: Secondary | ICD-10-CM | POA: Diagnosis present

## 2024-04-14 DIAGNOSIS — Z8249 Family history of ischemic heart disease and other diseases of the circulatory system: Secondary | ICD-10-CM | POA: Diagnosis not present

## 2024-04-14 DIAGNOSIS — I272 Pulmonary hypertension, unspecified: Secondary | ICD-10-CM | POA: Diagnosis present

## 2024-04-14 DIAGNOSIS — Z9911 Dependence on respirator [ventilator] status: Secondary | ICD-10-CM

## 2024-04-14 DIAGNOSIS — N19 Unspecified kidney failure: Secondary | ICD-10-CM

## 2024-04-14 DIAGNOSIS — Z6836 Body mass index (BMI) 36.0-36.9, adult: Secondary | ICD-10-CM

## 2024-04-14 DIAGNOSIS — E785 Hyperlipidemia, unspecified: Secondary | ICD-10-CM | POA: Diagnosis present

## 2024-04-14 DIAGNOSIS — R4182 Altered mental status, unspecified: Secondary | ICD-10-CM

## 2024-04-14 DIAGNOSIS — K7290 Hepatic failure, unspecified without coma: Secondary | ICD-10-CM | POA: Diagnosis present

## 2024-04-14 DIAGNOSIS — Z79899 Other long term (current) drug therapy: Secondary | ICD-10-CM

## 2024-04-14 DIAGNOSIS — N1832 Chronic kidney disease, stage 3b: Secondary | ICD-10-CM | POA: Diagnosis present

## 2024-04-14 DIAGNOSIS — Z7901 Long term (current) use of anticoagulants: Secondary | ICD-10-CM | POA: Diagnosis not present

## 2024-04-14 DIAGNOSIS — M109 Gout, unspecified: Secondary | ICD-10-CM | POA: Diagnosis present

## 2024-04-14 DIAGNOSIS — J9819 Other pulmonary collapse: Secondary | ICD-10-CM | POA: Diagnosis present

## 2024-04-14 DIAGNOSIS — Z7189 Other specified counseling: Secondary | ICD-10-CM

## 2024-04-14 DIAGNOSIS — G9341 Metabolic encephalopathy: Secondary | ICD-10-CM | POA: Diagnosis present

## 2024-04-14 DIAGNOSIS — I130 Hypertensive heart and chronic kidney disease with heart failure and stage 1 through stage 4 chronic kidney disease, or unspecified chronic kidney disease: Principal | ICD-10-CM | POA: Diagnosis present

## 2024-04-14 DIAGNOSIS — E669 Obesity, unspecified: Secondary | ICD-10-CM | POA: Diagnosis present

## 2024-04-14 DIAGNOSIS — K760 Fatty (change of) liver, not elsewhere classified: Secondary | ICD-10-CM | POA: Diagnosis present

## 2024-04-14 DIAGNOSIS — J810 Acute pulmonary edema: Secondary | ICD-10-CM | POA: Diagnosis present

## 2024-04-14 DIAGNOSIS — W19XXXA Unspecified fall, initial encounter: Secondary | ICD-10-CM | POA: Diagnosis present

## 2024-04-14 DIAGNOSIS — I482 Chronic atrial fibrillation, unspecified: Secondary | ICD-10-CM | POA: Diagnosis present

## 2024-04-14 DIAGNOSIS — R609 Edema, unspecified: Secondary | ICD-10-CM | POA: Diagnosis not present

## 2024-04-14 DIAGNOSIS — Z833 Family history of diabetes mellitus: Secondary | ICD-10-CM

## 2024-04-14 DIAGNOSIS — J90 Pleural effusion, not elsewhere classified: Secondary | ICD-10-CM | POA: Diagnosis not present

## 2024-04-14 DIAGNOSIS — I5030 Unspecified diastolic (congestive) heart failure: Secondary | ICD-10-CM | POA: Diagnosis not present

## 2024-04-14 DIAGNOSIS — I5033 Acute on chronic diastolic (congestive) heart failure: Secondary | ICD-10-CM | POA: Diagnosis present

## 2024-04-14 DIAGNOSIS — J189 Pneumonia, unspecified organism: Secondary | ICD-10-CM

## 2024-04-14 DIAGNOSIS — Z66 Do not resuscitate: Secondary | ICD-10-CM | POA: Diagnosis present

## 2024-04-14 DIAGNOSIS — Z720 Tobacco use: Secondary | ICD-10-CM

## 2024-04-14 DIAGNOSIS — I4891 Unspecified atrial fibrillation: Secondary | ICD-10-CM | POA: Diagnosis not present

## 2024-04-14 DIAGNOSIS — Z993 Dependence on wheelchair: Secondary | ICD-10-CM

## 2024-04-14 DIAGNOSIS — N182 Chronic kidney disease, stage 2 (mild): Secondary | ICD-10-CM | POA: Diagnosis not present

## 2024-04-14 DIAGNOSIS — Z888 Allergy status to other drugs, medicaments and biological substances status: Secondary | ICD-10-CM

## 2024-04-14 DIAGNOSIS — Z905 Acquired absence of kidney: Secondary | ICD-10-CM

## 2024-04-14 DIAGNOSIS — I509 Heart failure, unspecified: Secondary | ICD-10-CM

## 2024-04-14 DIAGNOSIS — T8110XA Postprocedural shock unspecified, initial encounter: Secondary | ICD-10-CM | POA: Diagnosis not present

## 2024-04-14 DIAGNOSIS — Z515 Encounter for palliative care: Secondary | ICD-10-CM | POA: Diagnosis not present

## 2024-04-14 LAB — BASIC METABOLIC PANEL WITH GFR
Anion gap: 13 (ref 5–15)
Anion gap: 15 (ref 5–15)
BUN: 111 mg/dL — ABNORMAL HIGH (ref 8–23)
BUN: 115 mg/dL — ABNORMAL HIGH (ref 8–23)
CO2: 29 mmol/L (ref 22–32)
CO2: 27 mmol/L (ref 22–32)
Calcium: 9 mg/dL (ref 8.9–10.3)
Calcium: 9.2 mg/dL (ref 8.9–10.3)
Chloride: 101 mmol/L (ref 98–111)
Chloride: 100 mmol/L (ref 98–111)
Creatinine, Ser: 4.82 mg/dL — ABNORMAL HIGH (ref 0.61–1.24)
Creatinine, Ser: 4.86 mg/dL — ABNORMAL HIGH (ref 0.61–1.24)
GFR, Estimated: 11 mL/min — ABNORMAL LOW
GFR, Estimated: 11 mL/min — ABNORMAL LOW
Glucose, Bld: 165 mg/dL — ABNORMAL HIGH (ref 70–99)
Glucose, Bld: 80 mg/dL (ref 70–99)
Potassium: 4.4 mmol/L (ref 3.5–5.1)
Potassium: 3.7 mmol/L (ref 3.5–5.1)
Sodium: 143 mmol/L (ref 135–145)
Sodium: 143 mmol/L (ref 135–145)

## 2024-04-14 LAB — CBC WITH DIFFERENTIAL/PLATELET
Abs Immature Granulocytes: 0.13 K/uL — ABNORMAL HIGH (ref 0.00–0.07)
Basophils Absolute: 0.1 K/uL (ref 0.0–0.1)
Basophils Relative: 1 %
Eosinophils Absolute: 0.2 K/uL (ref 0.0–0.5)
Eosinophils Relative: 2 %
HCT: 42.6 % (ref 39.0–52.0)
Hemoglobin: 13.1 g/dL (ref 13.0–17.0)
Immature Granulocytes: 2 %
Lymphocytes Relative: 11 %
Lymphs Abs: 0.9 K/uL (ref 0.7–4.0)
MCH: 28.6 pg (ref 26.0–34.0)
MCHC: 30.8 g/dL (ref 30.0–36.0)
MCV: 93 fL (ref 80.0–100.0)
Monocytes Absolute: 0.7 K/uL (ref 0.1–1.0)
Monocytes Relative: 9 %
Neutro Abs: 5.8 K/uL (ref 1.7–7.7)
Neutrophils Relative %: 75 %
Platelets: 166 K/uL (ref 150–400)
RBC: 4.58 MIL/uL (ref 4.22–5.81)
RDW: 17.2 % — ABNORMAL HIGH (ref 11.5–15.5)
WBC: 7.7 K/uL (ref 4.0–10.5)
nRBC: 0 % (ref 0.0–0.2)

## 2024-04-14 LAB — CBC
HCT: 40.3 % (ref 39.0–52.0)
Hemoglobin: 12.1 g/dL — ABNORMAL LOW (ref 13.0–17.0)
MCH: 28.1 pg (ref 26.0–34.0)
MCHC: 30 g/dL (ref 30.0–36.0)
MCV: 93.7 fL (ref 80.0–100.0)
Platelets: 158 K/uL (ref 150–400)
RBC: 4.3 MIL/uL (ref 4.22–5.81)
RDW: 16.9 % — ABNORMAL HIGH (ref 11.5–15.5)
WBC: 8.6 K/uL (ref 4.0–10.5)
nRBC: 0 % (ref 0.0–0.2)

## 2024-04-14 LAB — POCT I-STAT 7, (LYTES, BLD GAS, ICA,H+H)
Acid-Base Excess: 1 mmol/L (ref 0.0–2.0)
Acid-Base Excess: 3 mmol/L — ABNORMAL HIGH (ref 0.0–2.0)
Bicarbonate: 28.6 mmol/L — ABNORMAL HIGH (ref 20.0–28.0)
Bicarbonate: 27.9 mmol/L (ref 20.0–28.0)
Calcium, Ion: 1.2 mmol/L (ref 1.15–1.40)
Calcium, Ion: 1.2 mmol/L (ref 1.15–1.40)
HCT: 35 % — ABNORMAL LOW (ref 39.0–52.0)
HCT: 36 % — ABNORMAL LOW (ref 39.0–52.0)
Hemoglobin: 11.9 g/dL — ABNORMAL LOW (ref 13.0–17.0)
Hemoglobin: 12.2 g/dL — ABNORMAL LOW (ref 13.0–17.0)
O2 Saturation: 77 %
O2 Saturation: 91 %
Patient temperature: 96.1
Patient temperature: 36.6
Potassium: 3.9 mmol/L (ref 3.5–5.1)
Potassium: 3.9 mmol/L (ref 3.5–5.1)
Sodium: 137 mmol/L (ref 135–145)
Sodium: 142 mmol/L (ref 135–145)
TCO2: 30 mmol/L (ref 22–32)
TCO2: 29 mmol/L (ref 22–32)
pCO2 arterial: 53.3 mmHg — ABNORMAL HIGH (ref 32–48)
pCO2 arterial: 41.5 mmHg (ref 32–48)
pH, Arterial: 7.331 — ABNORMAL LOW (ref 7.35–7.45)
pH, Arterial: 7.433 (ref 7.35–7.45)
pO2, Arterial: 42 mmHg — ABNORMAL LOW (ref 83–108)
pO2, Arterial: 59 mmHg — ABNORMAL LOW (ref 83–108)

## 2024-04-14 LAB — POCT I-STAT, CHEM 8
BUN: 127 mg/dL — ABNORMAL HIGH (ref 8–23)
BUN: 129 mg/dL — ABNORMAL HIGH (ref 8–23)
Calcium, Ion: 1.16 mmol/L (ref 1.15–1.40)
Calcium, Ion: 1.18 mmol/L (ref 1.15–1.40)
Chloride: 100 mmol/L (ref 98–111)
Chloride: 100 mmol/L (ref 98–111)
Creatinine, Ser: 4.7 mg/dL — ABNORMAL HIGH (ref 0.61–1.24)
Creatinine, Ser: 4.9 mg/dL — ABNORMAL HIGH (ref 0.61–1.24)
Glucose, Bld: 144 mg/dL — ABNORMAL HIGH (ref 70–99)
Glucose, Bld: 147 mg/dL — ABNORMAL HIGH (ref 70–99)
HCT: 40 % (ref 39.0–52.0)
HCT: 40 % (ref 39.0–52.0)
Hemoglobin: 13.6 g/dL (ref 13.0–17.0)
Hemoglobin: 13.6 g/dL (ref 13.0–17.0)
Potassium: 3.9 mmol/L (ref 3.5–5.1)
Potassium: 3.9 mmol/L (ref 3.5–5.1)
Sodium: 142 mmol/L (ref 135–145)
Sodium: 142 mmol/L (ref 135–145)
TCO2: 28 mmol/L (ref 22–32)
TCO2: 29 mmol/L (ref 22–32)

## 2024-04-14 LAB — BLOOD CULTURE ID PANEL (REFLEXED) - BCID2

## 2024-04-14 LAB — PROTIME-INR
INR: 1.2 (ref 0.8–1.2)
Prothrombin Time: 15.6 s — ABNORMAL HIGH (ref 11.4–15.2)

## 2024-04-14 LAB — LIPASE, BLOOD: Lipase: 32 U/L (ref 11–51)

## 2024-04-14 LAB — URINALYSIS, W/ REFLEX TO CULTURE (INFECTION SUSPECTED)
Bilirubin Urine: NEGATIVE
Glucose, UA: NEGATIVE mg/dL
Ketones, ur: NEGATIVE mg/dL
Nitrite: NEGATIVE
Protein, ur: NEGATIVE mg/dL
Specific Gravity, Urine: 1.013 (ref 1.005–1.030)
pH: 5 (ref 5.0–8.0)

## 2024-04-14 LAB — COMPREHENSIVE METABOLIC PANEL WITH GFR
ALT: 6 U/L (ref 0–44)
AST: 11 U/L — ABNORMAL LOW (ref 15–41)
Albumin: 3.7 g/dL (ref 3.5–5.0)
Alkaline Phosphatase: 80 U/L (ref 38–126)
Anion gap: 12 (ref 5–15)
BUN: 112 mg/dL — ABNORMAL HIGH (ref 8–23)
CO2: 31 mmol/L (ref 22–32)
Calcium: 9.2 mg/dL (ref 8.9–10.3)
Chloride: 102 mmol/L (ref 98–111)
Creatinine, Ser: 4.8 mg/dL — ABNORMAL HIGH (ref 0.61–1.24)
GFR, Estimated: 11 mL/min — ABNORMAL LOW
Glucose, Bld: 156 mg/dL — ABNORMAL HIGH (ref 70–99)
Potassium: 4.1 mmol/L (ref 3.5–5.1)
Sodium: 144 mmol/L (ref 135–145)
Total Bilirubin: 0.7 mg/dL (ref 0.0–1.2)
Total Protein: 6.4 g/dL — ABNORMAL LOW (ref 6.5–8.1)

## 2024-04-14 LAB — GLUCOSE, CAPILLARY
Glucose-Capillary: 101 mg/dL — ABNORMAL HIGH (ref 70–99)
Glucose-Capillary: 101 mg/dL — ABNORMAL HIGH (ref 70–99)
Glucose-Capillary: 154 mg/dL — ABNORMAL HIGH (ref 70–99)
Glucose-Capillary: 83 mg/dL (ref 70–99)
Glucose-Capillary: 84 mg/dL (ref 70–99)
Glucose-Capillary: 86 mg/dL (ref 70–99)
Glucose-Capillary: 88 mg/dL (ref 70–99)

## 2024-04-14 LAB — CG4 I-STAT (LACTIC ACID)
Lactic Acid, Venous: 1 mmol/L (ref 0.5–1.9)
Lactic Acid, Venous: 1.1 mmol/L (ref 0.5–1.9)

## 2024-04-14 LAB — HEMOGLOBIN A1C
Hgb A1c MFr Bld: 8 % — ABNORMAL HIGH (ref 4.8–5.6)
Mean Plasma Glucose: 182.9 mg/dL

## 2024-04-14 LAB — MAGNESIUM: Magnesium: 2.3 mg/dL (ref 1.7–2.4)

## 2024-04-14 LAB — PROCALCITONIN: Procalcitonin: 0.21 ng/mL

## 2024-04-14 LAB — PRO BRAIN NATRIURETIC PEPTIDE: Pro Brain Natriuretic Peptide: 4020 pg/mL — ABNORMAL HIGH

## 2024-04-14 LAB — PHOSPHORUS: Phosphorus: 7 mg/dL — ABNORMAL HIGH (ref 2.5–4.6)

## 2024-04-14 LAB — MRSA NEXT GEN BY PCR, NASAL: MRSA by PCR Next Gen: NOT DETECTED

## 2024-04-14 MED ORDER — SUCCINYLCHOLINE CHLORIDE 200 MG/10ML IV SOSY
PREFILLED_SYRINGE | INTRAVENOUS | Status: AC
  Administered 2024-04-14: 150 mg
  Filled 2024-04-14: qty 10

## 2024-04-14 MED ORDER — PROPOFOL 1000 MG/100ML IV EMUL
INTRAVENOUS | Status: AC
  Filled 2024-04-14: qty 100

## 2024-04-14 MED ORDER — APIXABAN 2.5 MG PO TABS
2.5000 mg | ORAL_TABLET | Freq: Two times a day (BID) | ORAL | Status: DC
  Administered 2024-04-14: 2.5 mg via NASOGASTRIC
  Filled 2024-04-14: qty 1

## 2024-04-14 MED ORDER — ETOMIDATE 2 MG/ML IV SOLN
INTRAVENOUS | Status: AC
  Administered 2024-04-14: 20 mg
  Filled 2024-04-14: qty 10

## 2024-04-14 MED ORDER — FUROSEMIDE 10 MG/ML IJ SOLN
80.0000 mg | Freq: Once | INTRAMUSCULAR | Status: AC
  Administered 2024-04-14: 80 mg via INTRAVENOUS
  Filled 2024-04-14: qty 8

## 2024-04-14 MED ORDER — INSULIN ASPART 100 UNIT/ML IJ SOLN
0.0000 [IU] | INTRAMUSCULAR | Status: DC
  Administered 2024-04-14: 2 [IU] via SUBCUTANEOUS
  Administered 2024-04-15 (×2): 1 [IU] via SUBCUTANEOUS
  Administered 2024-04-15 – 2024-04-16 (×3): 2 [IU] via SUBCUTANEOUS
  Filled 2024-04-14: qty 2
  Filled 2024-04-14: qty 5
  Filled 2024-04-14: qty 1
  Filled 2024-04-14 (×2): qty 2
  Filled 2024-04-14: qty 1

## 2024-04-14 MED ORDER — FAMOTIDINE 20 MG PO TABS
20.0000 mg | ORAL_TABLET | Freq: Two times a day (BID) | ORAL | Status: DC
  Administered 2024-04-14: 20 mg
  Filled 2024-04-14: qty 1

## 2024-04-14 MED ORDER — PROPOFOL 1000 MG/100ML IV EMUL
0.0000 ug/kg/min | INTRAVENOUS | Status: DC
  Administered 2024-04-14: 25 ug/kg/min via INTRAVENOUS
  Administered 2024-04-14: 10 ug/kg/min via INTRAVENOUS
  Administered 2024-04-14: 25 ug/kg/min via INTRAVENOUS
  Administered 2024-04-15: 30 ug/kg/min via INTRAVENOUS
  Administered 2024-04-15: 20 ug/kg/min via INTRAVENOUS
  Administered 2024-04-15 – 2024-04-16 (×2): 30 ug/kg/min via INTRAVENOUS
  Administered 2024-04-16: 20 ug/kg/min via INTRAVENOUS
  Administered 2024-04-16: 30 ug/kg/min via INTRAVENOUS
  Administered 2024-04-16 – 2024-04-17 (×4): 35 ug/kg/min via INTRAVENOUS
  Filled 2024-04-14 (×5): qty 100
  Filled 2024-04-14: qty 200
  Filled 2024-04-14 (×7): qty 100

## 2024-04-14 MED ORDER — APIXABAN 5 MG PO TABS
5.0000 mg | ORAL_TABLET | Freq: Two times a day (BID) | ORAL | Status: DC
  Administered 2024-04-14: 5 mg via NASOGASTRIC
  Filled 2024-04-14: qty 1

## 2024-04-14 MED ORDER — LORAZEPAM 2 MG/ML IJ SOLN
0.5000 mg | Freq: Once | INTRAMUSCULAR | Status: AC
  Administered 2024-04-14: 0.5 mg via INTRAVENOUS
  Filled 2024-04-14: qty 1

## 2024-04-14 MED ORDER — SODIUM CHLORIDE 0.9 % IV SOLN
1.0000 g | Freq: Every day | INTRAVENOUS | Status: DC
  Administered 2024-04-14 – 2024-04-16 (×3): 1 g via INTRAVENOUS
  Filled 2024-04-14 (×3): qty 10

## 2024-04-14 MED ORDER — FAMOTIDINE 20 MG PO TABS
20.0000 mg | ORAL_TABLET | Freq: Every day | ORAL | Status: DC
  Administered 2024-04-15 – 2024-04-16 (×2): 20 mg
  Filled 2024-04-14 (×2): qty 1

## 2024-04-14 MED ORDER — PANTOPRAZOLE SODIUM 40 MG IV SOLR
40.0000 mg | Freq: Every day | INTRAVENOUS | Status: DC
  Administered 2024-04-14: 40 mg via INTRAVENOUS
  Filled 2024-04-14: qty 10

## 2024-04-14 MED ORDER — FENTANYL CITRATE (PF) 50 MCG/ML IJ SOSY
50.0000 ug | PREFILLED_SYRINGE | INTRAMUSCULAR | Status: DC | PRN
  Administered 2024-04-16 (×2): 50 ug via INTRAVENOUS
  Filled 2024-04-14 (×2): qty 1

## 2024-04-14 MED ORDER — PROPOFOL 1000 MG/100ML IV EMUL
0.0000 ug/kg/min | INTRAVENOUS | Status: DC
  Administered 2024-04-14: 5 ug/kg/min via INTRAVENOUS

## 2024-04-14 MED ORDER — CHLORHEXIDINE GLUCONATE CLOTH 2 % EX PADS
6.0000 | MEDICATED_PAD | Freq: Every day | CUTANEOUS | Status: DC
  Administered 2024-04-14 – 2024-04-16 (×3): 6 via TOPICAL

## 2024-04-14 MED ORDER — FENTANYL CITRATE (PF) 50 MCG/ML IJ SOSY
50.0000 ug | PREFILLED_SYRINGE | INTRAMUSCULAR | Status: DC | PRN
  Administered 2024-04-16: 100 ug via INTRAVENOUS
  Filled 2024-04-14: qty 2

## 2024-04-14 MED ORDER — FUROSEMIDE 10 MG/ML IJ SOLN
120.0000 mg | Freq: Once | INTRAVENOUS | Status: AC
  Administered 2024-04-14: 120 mg via INTRAVENOUS
  Filled 2024-04-14: qty 10

## 2024-04-14 MED ORDER — POLYETHYLENE GLYCOL 3350 17 G PO PACK
17.0000 g | PACK | Freq: Every day | ORAL | Status: DC
  Administered 2024-04-14 – 2024-04-15 (×2): 17 g
  Filled 2024-04-14 (×3): qty 1

## 2024-04-14 MED ORDER — DOCUSATE SODIUM 50 MG/5ML PO LIQD
100.0000 mg | Freq: Two times a day (BID) | ORAL | Status: DC
  Administered 2024-04-14 – 2024-04-15 (×2): 100 mg
  Filled 2024-04-14 (×3): qty 10

## 2024-04-14 MED ORDER — ONDANSETRON HCL 4 MG/2ML IJ SOLN: 4.0000 mg | Freq: Four times a day (QID) | INTRAMUSCULAR | Status: DC | PRN

## 2024-04-14 MED ORDER — SODIUM CHLORIDE 0.9 % IV SOLN
500.0000 mg | Freq: Every day | INTRAVENOUS | Status: AC
  Administered 2024-04-14 – 2024-04-16 (×3): 500 mg via INTRAVENOUS
  Filled 2024-04-14 (×3): qty 5

## 2024-04-14 NOTE — Consult Note (Signed)
 This remote consultation was conducted using EMR wound digital images downloaded by the staff member. Pertinent information was reviewed in order to determine recommendations. Will ask primary nurse to place images of MASD in chart.   WOC Nurse Consult Note: Reason for Consult:Skin Tears, MASD skin folds Wound type: Intertrigo and dermal tears Pressure Injury POA: No Wound bed:  Skin Tear image partial-thickness skin loss Drainage appears moist from image Periwound:erythema  Dressing procedure/placement/frequency:   Skin Folds for Rash  Measure and cut length of InterDry to fit in skin folds that have skin breakdown Tuck InterDry fabric into skin folds in a single layer, allow for 2 inches of overhang from skin edges to allow for wicking to occur May remove to bathe; dry area thoroughly and then tuck into affected areas again Do not apply any creams, AF powders, or ointments when using InterDry DO NOT THROW AWAY FOR 5 DAYS unless soiled with stool Cut Interdry Ag fabric and place along all rash skin fold sites. DO NOT [facility] product, this will inactivate the silver in the material. New sheet of Interdry should be applied after 5 days of use if patient continues to have skin breakdown  All Skin Tears: cleanse wound with normal saline or Vashe solution #[id number], pat dry with gauze, do not rinse, and apply 2 layers of Xeroform yellow gauze over wound, cover with 4x4 Mepilex foam dressing or rolled Kerlix gauze, secure in place with tape.   WOC will not follow and will remove patient from census task list.  Please reconsult if wound worsens in condition and notify provider.   Sherrilyn Hals MSN RN CWOCN WOC [facility]  [phone] (Available from 7-3 pm Mon-Friday)

## 2024-04-14 NOTE — ED Provider Notes (Signed)
 " [facility] Provider Note   CSN: [id number] Arrival date & time: 04/14/24  0121     Patient presents with: Douglas Soto is a 83 y.o. male.   HPI    This is an 83 year old male with a history of atrial fibrillation, chronic heart failure and liver failure who presents with fall.  Reportedly fell at home.  Reports that his knees gave out for him.  Recently has had some shortness of breath.  Noted him to have O2 sats of 60%.  No congestion, cough or fevers noted.  Has had increasing lower extremity edema and abdominal distention.  History of heart failure and atrial fibrillation.  Daughter reports that he does not follow-up and is not compliant with his doctor visits or medications.   Prior to Admission medications  Medication Sig Start Date End Date Taking? Authorizing Provider  apixaban  (ELIQUIS ) 2.5 MG TABS tablet Take 1 tablet (2.5 mg total) by mouth 2 (two) times daily. 09/11/23   Caro Harlene POUR, NP  blood glucose meter kit and supplies Dispense based on patient and insurance preference. Use up to four times daily as directed. (FOR ICD-10 E10.9, E11.9). Patient not taking: Reported on 10/08/2023 06/05/22   Caro Harlene POUR, NP  colchicine  0.6 MG tablet TAKE 2 TABLETS BY MOUTH NOW AND  MAY REPEAT 1 TABLET IN AN HOUR  AS DIRECTED 01/03/23   Eubanks, Jessica K, NP  dapagliflozin  propanediol (FARXIGA ) 5 MG TABS tablet Take 1 tablet (5 mg total) by mouth daily before breakfast. Patient not taking: Reported on 10/08/2023 03/22/23   Caro Harlene POUR, NP  doxazosin  (CARDURA ) 8 MG tablet Take 1 tablet (8 mg total) by mouth daily. 01/02/24   Caro Harlene POUR, NP  furosemide  (LASIX ) 40 MG tablet Take 1 tablet (40 mg total) by mouth daily. APPOINTMENT DUE 02/27/24   Caro Harlene POUR, NP  metoprolol  succinate (TOPROL -XL) 50 MG 24 hr tablet Take 1 tablet (50 mg total) by mouth daily. 02/27/24   Caro Harlene POUR, NP  Multiple  Vitamins-Minerals (CENTRUM SILVER 50+MEN) TABS Take 1 tablet by mouth daily.    [provider]  Olmesartan -amLODIPine -HCTZ 20-5-12.5 MG TABS Take 1 tablet by mouth daily. 02/27/24   Caro Harlene POUR, NP  potassium chloride  (KLOR-CON  M) 10 MEQ tablet Take 1 tablet (10 mEq total) by mouth daily. 11/19/23   Caro Harlene POUR, NP  rosuvastatin  (CRESTOR ) 5 MG tablet Take 1 tablet (5 mg total) by mouth daily. 01/31/24   Caro Harlene POUR, NP    Allergies: Allopurinol  and Jardiance  [empagliflozin ]    Review of Systems  Constitutional:  Negative for fever.  Respiratory:  Positive for cough and shortness of breath.   Cardiovascular:  Positive for leg swelling.  Gastrointestinal:  Positive for abdominal distention. Negative for abdominal pain.  All other systems reviewed and are negative.   Updated Vital Signs BP (!) 83/60   Pulse 76   Temp (!) 96.1 F (35.6 C) (Axillary)   Resp 15   Ht 1.727 m (5' 8)   SpO2 100%   BMI 33.48 kg/m   Physical Exam Vitals and nursing note reviewed.  Constitutional:      Appearance: He is well-developed.     Comments: Ill-appearing, obese  HENT:     Head: Normocephalic and atraumatic.     Nose: Nose normal.     Mouth/Throat:     Mouth: Mucous membranes are dry.  Eyes:  Pupils: Pupils are equal, round, and reactive to light.  Cardiovascular:     Rate and Rhythm: Normal rate. Rhythm irregular.     Heart sounds: Normal heart sounds. No murmur heard. Pulmonary:     Effort: Pulmonary effort is normal. No respiratory distress.     Breath sounds: Rhonchi present. No wheezing.     Comments: Diminished breath sounds on the right Abdominal:     General: Bowel sounds are normal. There is distension.     Palpations: Abdomen is soft.     Tenderness: There is no abdominal tenderness. There is no guarding or rebound.  Musculoskeletal:     Cervical back: Neck supple.     Right lower leg: Edema present.     Left lower leg: Edema present.   Lymphadenopathy:     Cervical: No cervical adenopathy.  Skin:    General: Skin is warm and dry.  Neurological:     Mental Status: He is alert and oriented to person, place, and time.     (all labs ordered are listed, but only abnormal results are displayed) Labs Reviewed  COMPREHENSIVE METABOLIC PANEL WITH GFR - Abnormal; Notable for the following components:      Result Value   Glucose, Bld 156 (*)    BUN 112 (*)    Creatinine, Ser 4.80 (*)    Total Protein 6.4 (*)    AST 11 (*)    GFR, Estimated 11 (*)    All other components within normal limits  CBC WITH DIFFERENTIAL/PLATELET - Abnormal; Notable for the following components:   RDW 17.2 (*)    Abs Immature Granulocytes 0.13 (*)    All other components within normal limits  PROTIME-INR - Abnormal; Notable for the following components:   Prothrombin Time 15.6 (*)    All other components within normal limits  PRO BRAIN NATRIURETIC PEPTIDE - Abnormal; Notable for the following components:   Pro Brain Natriuretic Peptide 4,020.0 (*)    All other components within normal limits  URINALYSIS, W/ REFLEX TO CULTURE (INFECTION SUSPECTED) - Abnormal; Notable for the following components:   APPearance HAZY (*)    Hgb urine dipstick SMALL (*)    Leukocytes,Ua TRACE (*)    Bacteria, UA RARE (*)    All other components within normal limits  CULTURE, BLOOD (ROUTINE X 2)  CULTURE, BLOOD (ROUTINE X 2)  MRSA NEXT GEN BY PCR, NASAL  CULTURE, RESPIRATORY W GRAM STAIN  CULTURE, BLOOD (ROUTINE X 2)  CULTURE, BLOOD (ROUTINE X 2)  LIPASE, BLOOD  URINALYSIS, W/ REFLEX TO CULTURE (INFECTION SUSPECTED)  CBC  BASIC METABOLIC PANEL WITH GFR  BLOOD GAS, ARTERIAL  MAGNESIUM   PHOSPHORUS  HEMOGLOBIN A1C  PROCALCITONIN  I-STAT CG4 LACTIC ACID, ED  I-STAT CHEM 8, ED  I-STAT CG4 LACTIC ACID, ED    EKG: EKG Interpretation Date/Time:  Monday April 14 2024 01:40:06 EST Ventricular Rate:  88 PR Interval:    QRS Duration:  106 QT  Interval:  357 QTC Calculation: 432 R Axis:   83  Text Interpretation: Atrial fibrillation Borderline right axis deviation Low voltage, extremity leads Nonspecific T abnormalities, lateral leads Confirmed by Bari Pfeiffer ([id number]) on 04/14/2024 2:15:40 AM  Radiology: CT Head Wo Contrast Result Date: 04/14/2024 EXAM: CT HEAD WITHOUT CONTRAST 04/14/2024 03:26:32 AM TECHNIQUE: CT of the head was performed without the administration of intravenous contrast. Automated exposure control, iterative reconstruction, and/or weight based adjustment of the mA/kV was utilized to reduce the radiation dose to as low as  reasonably achievable. COMPARISON: None available. CLINICAL HISTORY: Head trauma, minor (Age >= 65y) FINDINGS: BRAIN AND VENTRICLES: No acute hemorrhage. No evidence of acute infarct. No hydrocephalus. No extra-axial collection. No mass effect or midline shift. Moderate patchy white matter hyperintensities, compatible with chronic microvascular ischemic change. ORBITS: No acute abnormality. SINUSES: Right sphenoid sinus retention cyst. SOFT TISSUES AND SKULL: Right mastoid effusion. No skull fracture. IMPRESSION: 1. No acute intracranial abnormality. Electronically signed by: Gilmore Molt 04/14/2024 03:36 AM EST RP Workstation: [id number]   DG Chest Port 1 View Result Date: 04/14/2024 EXAM: 1 VIEW(S) XRAY OF THE CHEST 04/14/2024 02:07:00 AM COMPARISON: None available. CLINICAL HISTORY: Questionable sepsis - evaluate for abnormality FINDINGS: LUNGS AND PLEURA: Enlarging, Moderate right pleural effusion with compressive atelectasis in the right lung base. New mild pulmonary edema. No pneumothorax. HEART AND MEDIASTINUM: Rightward mediastinal shift. Mild cardiomegaly. Aortic atherosclerosis. BONES AND SOFT TISSUES: No acute osseous abnormality. IMPRESSION: 1. Enlarging moderate right pleural effusion with compressive atelectasis in the right lung base and associated rightward mediastinal shift. 2. Mild  cardiomegaly with new mild pulmonary edema. 3. Aortic atherosclerosis. Electronically signed by: Dorethia Molt MD 04/14/2024 02:27 AM EST RP Workstation: [id number]     .Critical Care  Performed by: Bari Charmaine FALCON, MD Authorized by: Bari Charmaine FALCON, MD   Critical care provider statement:    Critical care time (minutes):  30 (65)   Critical care was necessary to treat or prevent imminent or life-threatening deterioration of the following conditions:  Respiratory failure and renal failure   Critical care was time spent personally by me on the following activities:  Development of treatment plan with patient or surrogate, discussions with consultants, evaluation of patient's response to treatment, examination of patient, ordering and review of laboratory studies, ordering and review of radiographic studies, ordering and performing treatments and interventions, pulse oximetry, re-evaluation of patient's condition and review of old charts    Medications Ordered in the ED  propofol  (DIPRIVAN ) 1000 MG/100ML infusion (5 mcg/kg/min Intravenous New Bag/Given 04/14/24 0418)  Chlorhexidine  Gluconate Cloth 2 % PADS 6 each (has no administration in time range)  famotidine  (PEPCID ) tablet 20 mg (has no administration in time range)  pantoprazole  (PROTONIX ) injection 40 mg (has no administration in time range)  ondansetron  (ZOFRAN ) injection 4 mg (has no administration in time range)  insulin  aspart (novoLOG ) injection 0-9 Units (has no administration in time range)  apixaban  (ELIQUIS ) tablet 5 mg (has no administration in time range)  cefTRIAXone  (ROCEPHIN ) 1 g in sodium chloride  0.9 % 100 mL IVPB (has no administration in time range)  azithromycin  (ZITHROMAX ) 500 mg in sodium chloride  0.9 % 250 mL IVPB (has no administration in time range)  LORazepam  (ATIVAN ) injection 0.5 mg (0.5 mg Intravenous Given 04/14/24 0310)  furosemide  (LASIX ) injection 80 mg (80 mg Intravenous Given 04/14/24 0339)   succinylcholine  (ANECTINE ) 200 MG/10ML syringe (150 mg  Given 04/14/24 0349)  etomidate  (AMIDATE ) 2 MG/ML injection (20 mg  Given 04/14/24 0349)    Clinical Course as of 04/14/24 0455  Mon Apr 14, 2024  0305 Agitated, uremic.  Will give a small dose of Ativan . [CH]  0345 Had long discussion with the patient family regarding CODE STATUS.  Last hospitalization he was full code.  They were under the understanding that he may have previously been DNR.  Discussed with him the ramifications of DNR/DNI status.  Patient is acutely hypoxic and encephalopathic.  Would likely not do well with noninvasive respiratory interventions.  Also do not think he  would do well if he coded and required chest compressions.  That would likely be a nonsurvivable event for him.  However, given his progressive hypoxia and agitation, this will limit intervention without intubation.  Family elected to intubate but no chest compressions. [CH]    Clinical Course User Index [CH] Zori Benbrook, Charmaine FALCON, MD                                 Medical Decision Making Amount and/or Complexity of Data Reviewed Labs: ordered. Radiology: ordered.  Risk Prescription drug management. Decision regarding hospitalization.   This patient presents to the ED for concern of fall, shortness of breath, this involves an extensive number of treatment options, and is a complaint that carries with it a high risk of complications and morbidity.  I considered the following differential and admission for this acute, potentially life threatening condition.  The differential diagnosis includes toxic respiratory failure, volume overload, encephalopathy, acute injury  MDM:    This is a 83 year old male who presents with shortness of breath and fall.  He is overall ill-appearing.  Hypoxic.  Encephalopathic and agitated.  Appears volume overloaded.  He is on a nonrebreather on my initial evaluation.  His abdomen is distended and he has lower extremity  swelling.  Initial temperature is 96.1.  Labs are notable for creatinine of 4.8.  Chest x-ray is concerning for pleural effusion and pulmonary edema.  Patient was given a dose of IV Lasix .  Lower suspicion for infection.  See clinical discussion about above.  Patient was intubated and will plan for diuresis.  Will admit to the ICU.  (Labs, imaging, consults)  Labs: I Ordered, and personally interpreted labs.  The pertinent results include: CBC, BMP, BNP  Imaging Studies ordered: I ordered imaging studies including chest x-ray I independently visualized and interpreted imaging. I agree with the radiologist interpretation  Additional history obtained from chart review.  External records from outside source obtained and reviewed including prior dilations and admissions  Cardiac Monitoring: The patient was maintained on a cardiac monitor.  If on the cardiac monitor, I personally viewed and interpreted the cardiac monitored which showed an underlying rhythm of: Atrial fibrillation  Reevaluation: After the interventions noted above, I reevaluated the patient and found that they have :worsened  Social Determinants of Health:  lives independently  Disposition: Admit  Co morbidities that complicate the patient evaluation  Past Medical History:  Diagnosis Date   Cholelithiasis 02/2018   uncomplicated.     Chronic kidney disease, stage II (mild)    Depression    Diabetes mellitus without complication (HCC)    Essential hypertension, malignant    Fatty liver 02/2018   on ultrasound   Gout, unspecified    Hyperlipidemia    Lumbago    Obesity, unspecified    Pain in joint, pelvic region and thigh    Pleural effusion on right 02/2018     Medicines Meds ordered this encounter  Medications   LORazepam  (ATIVAN ) injection 0.5 mg   furosemide  (LASIX ) injection 80 mg   succinylcholine  (ANECTINE ) 200 MG/10ML syringe    Mayhew, Abbigail E: cabinet override   etomidate  (AMIDATE ) 2 MG/ML  injection    Mayhew, Abbigail E: cabinet override   propofol  (DIPRIVAN ) 1000 MG/100ML infusion   propofol  (DIPRIVAN ) 1000 MG/100ML infusion    Harl Knee R: cabinet override   Chlorhexidine  Gluconate Cloth 2 % PADS 6 each   famotidine  (  PEPCID ) tablet 20 mg   pantoprazole  (PROTONIX ) injection 40 mg   ondansetron  (ZOFRAN ) injection 4 mg   insulin  aspart (novoLOG ) injection 0-9 Units    Correction coverage::   Sensitive (thin, NPO, renal)    CBG < 70::   Implement Hypoglycemia Standing Orders and refer to Hypoglycemia Standing Orders sidebar report    CBG 70 - 120::   0 units    CBG 121 - 150::   1 unit    CBG 151 - 200::   2 units    CBG 201 - 250::   3 units    CBG 251 - 300::   5 units    CBG 301 - 350::   7 units    CBG 351 - 400:   9 units    CBG > 400:   call MD and obtain STAT lab verification   apixaban  (ELIQUIS ) tablet 5 mg   cefTRIAXone  (ROCEPHIN ) 1 g in sodium chloride  0.9 % 100 mL IVPB    Antibiotic Indication::   CAP   azithromycin  (ZITHROMAX ) 500 mg in sodium chloride  0.9 % 250 mL IVPB    Antibiotic Indication::   CAP    I have reviewed the patients home medicines and have made adjustments as needed  Problem List / ED Course: Problem List Items Addressed This Visit   None Visit Diagnoses       Acute respiratory failure with hypoxia (HCC)    -  Primary     AKI (acute kidney injury)         Uremia         Acute pulmonary edema (HCC)                    Final diagnoses:  Acute respiratory failure with hypoxia (HCC)  AKI (acute kidney injury)  Uremia  Acute pulmonary edema [facility])    ED Discharge Orders     None          Bari Charmaine FALCON, MD 04/14/24 0501  "

## 2024-04-14 NOTE — Progress Notes (Signed)
 [facility] progress note:  Patient seen and examined serially throughout the day by this clinical research associate.  Very volume overloaded.  Warm extremities.  Oxygen saturation tolerable.  Arterial blood gas okay on 60% FiO2.  Review of serial chest x-rays demonstrates chronic right sided effusion and changes.  Do not think current effusion is particularly acute.  While pneumonia is possible, infection does not seem to be the biggest issue.  He is in florid heart failure.  Fortunately normotensive.  Normal lactic acid.  Review of TTE in 2022 reveals RV dysfunction, severely dilated left atrium, severely dilated right atrium.  In 2019 dilated cavity but normal RV function.  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  Unclear if new or worsening left-sided failure.  Valves in the past were okay.  I suspect diastolic dysfunction given his habitus but this was not commented on.  Will repeat TTE.  Dose with 120 mg IV Lasix  x 1 with mild urine output.  Repeat dose this afternoon.  Check electrolytes this evening.  Discussed with family dialysis is a bad idea in this man given his significant heart failure and age, comorbidities.  He is a DNR.

## 2024-04-14 NOTE — Progress Notes (Signed)
 RT assisted with patient transport on vent from ED to 3M07 without complications.

## 2024-04-14 NOTE — H&P (Signed)
 "  NAME:  Douglas Soto, MRN:  [id number], DOB:  Feb 14, 1941, LOS: 0 ADMISSION DATE:  04/14/2024, CONSULTATION DATE:  04/14/2024 REFERRING MD:  Charmaine Bough, MD, CHIEF COMPLAINT:  SOB   History of Present Illness:  83 y/o male with h/o A fib on Eliquis , HTN, DMT2, CKD stage 3b, CHF, fatty Liver, Gout, Obesity, HLD, Right pleural effusion, Total nephrectomy who was BIBEMS for fall and found to be 60% on RA per EMS, he was on 12 liters on arrival to ED.  He was intubated in ED.  His Cr 4.8, BNP 4020, WBC 7.7, HgB 13.1.  Pertinent  Medical History  A fib on Eliquis , HTN, DMT2, CKD stage 3b, CHF, fatty Liver, Gout, Obesity, HLD, Right pleural effusion, Total nephrectomy   Significant Hospital Events: Including procedures, antibiotic start and stop dates in addition to other pertinent events   12/29: admit to ICU  Interim History / Subjective:  N/a  Objective    Blood pressure (!) 102/45, pulse (!) 53, temperature (!) 96.1 F (35.6 C), temperature source Axillary, resp. rate 17, height 5' 8 (1.727 m), SpO2 (!) 83%.    Vent Mode: PRVC FiO2 (%):  [100 %] 100 % Set Rate:  [18 bmp] 18 bmp Vt Set:  [550 mL] 550 mL PEEP:  [5 cmH20] 5 cmH20 Plateau Pressure:  [19 cmH20] 19 cmH20  No intake or output data in the 24 hours ending 04/14/24 0416 There were no vitals filed for this visit.  Examination: General: Intubated and sedated, NAD HENT: pupils reactive, no icterus, neck supple Lungs: Course bs, no wheezes no rales Cardiovascular: reg s1s2 Abdomen: soft obese, nt nd bs pos Extremities: 1+_ pitting LE edema Neuro: sedated and intubated GU: external cath  Resolved problem list   Assessment and Plan  Acute hypoxic respiratory failure Vent management SAT/SBT when medically appropriate Follow ABGs/VBGs/O2 sats Right large pleural effusion Likely will need thoracentesis Now intubated Pneumonia Broad spectrum antibiotics Tracheal aspirates for cultures AMS Likely metabolic  encephalopathy CT head negative acute pathology CHF, EF unknown Get echo diuresis A fib on Eliquis  Continue with Eliquis  Not in RVR AKI on CKD May require CRRT/dialysis Monitor Urine output Monitor serum Cr HTN Monitor BP post intubation On vent and sedation may decrease his BP HLD Statins who able to tolerate po Obesity Supportive care   Labs   CBC: Recent Labs  Lab 04/14/24 0138  WBC 7.7  NEUTROABS 5.8  HGB 13.1  HCT 42.6  MCV 93.0  PLT 166    Basic Metabolic Panel: Recent Labs  Lab 04/14/24 0138  NA 144  K 4.1  CL 102  CO2 31  GLUCOSE 156*  BUN 112*  CREATININE 4.80*  CALCIUM  9.2   GFR: CrCl cannot be calculated (Unknown ideal weight.). Recent Labs  Lab 04/14/24 0138  WBC 7.7    Liver Function Tests: Recent Labs  Lab 04/14/24 0138  AST 11*  ALT 6  ALKPHOS 80  BILITOT 0.7  PROT 6.4*  ALBUMIN 3.7   Recent Labs  Lab 04/14/24 0138  LIPASE 32   No results for input(s): AMMONIA in the last 168 hours.  ABG No results found for: PHART, PCO2ART, PO2ART, HCO3, TCO2, ACIDBASEDEF, O2SAT   Coagulation Profile: Recent Labs  Lab 04/14/24 0138  INR 1.2    Cardiac Enzymes: No results for input(s): CKTOTAL, CKMB, CKMBINDEX, TROPONINI in the last 168 hours.  HbA1C: Hemoglobin A1C  Date/Time Value Ref Range Status  04/02/2018 12:00 AM 6.4  Final  Hgb A1c MFr Bld  Date/Time Value Ref Range Status  10/08/2023 02:15 PM 9.7 (H) <5.7 % Final    Comment:    For someone without known diabetes, a hemoglobin A1c value of 6.5% or greater indicates that they may have  diabetes and this should be confirmed with a follow-up  test. . For someone with known diabetes, a value <7% indicates  that their diabetes is well controlled and a value  greater than or equal to 7% indicates suboptimal  control. A1c targets should be individualized based on  duration of diabetes, age, comorbid conditions, and  other  considerations. . Currently, no consensus exists regarding use of hemoglobin A1c for diagnosis of diabetes for children. .   04/09/2023 11:26 AM 8.2 (H) <5.7 % of total Hgb Final    Comment:    For someone without known diabetes, a hemoglobin A1c value of 6.5% or greater indicates that they may have  diabetes and this should be confirmed with a follow-up  test. . For someone with known diabetes, a value <7% indicates  that their diabetes is well controlled and a value  greater than or equal to 7% indicates suboptimal  control. A1c targets should be individualized based on  duration of diabetes, age, comorbid conditions, and  other considerations. . Currently, no consensus exists regarding use of hemoglobin A1c for diagnosis of diabetes for children. .     CBG: No results for input(s): GLUCAP in the last 168 hours.  Review of Systems:   Intubated and sedated  Past Medical History:  He,  has a past medical history of Cholelithiasis (02/2018), Chronic kidney disease, stage II (mild), Depression, Diabetes mellitus without complication (HCC), Essential hypertension, malignant, Fatty liver (02/2018), Gout, unspecified, Hyperlipidemia, Lumbago, Obesity, unspecified, Pain in joint, pelvic region and thigh, and Pleural effusion on right (02/2018).   Surgical History:   Past Surgical History:  Procedure Laterality Date   APPENDECTOMY     COLONOSCOPY WITH PROPOFOL  N/A 02/22/2018   Procedure: COLONOSCOPY WITH PROPOFOL ;  Surgeon: Teressa Toribio SQUIBB, MD;  Location: [facility] ENDOSCOPY;  Service: Endoscopy;  Laterality: N/A;   CYST EXCISION  1998   back   ESOPHAGOGASTRODUODENOSCOPY (EGD) WITH PROPOFOL  N/A 02/22/2018   Procedure: ESOPHAGOGASTRODUODENOSCOPY (EGD) WITH PROPOFOL ;  Surgeon: Teressa Toribio SQUIBB, MD;  Location: [facility] ENDOSCOPY;  Service: Endoscopy;  Laterality: N/A;   POLYPECTOMY  02/22/2018   Procedure: POLYPECTOMY;  Surgeon: Teressa Toribio SQUIBB, MD;  Location: [facility] ENDOSCOPY;  Service:  Endoscopy;;   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 His smokeless tobacco use includes chew. He reports current alcohol use of about 1.0 standard drink of alcohol per week. He reports that he does not use drugs.   Family History:  His family history includes Diabetes in his brother and brother; Heart Problems in his father and mother; Heart disease in his brother; Pneumonia in his maternal grandfather.   Allergies Allergies[1]   Home Medications  Prior to Admission medications  Medication Sig Start Date End Date Taking? Authorizing Provider  apixaban  (ELIQUIS ) 2.5 MG TABS tablet Take 1 tablet (2.5 mg total) by mouth 2 (two) times daily. 09/11/23   Caro Harlene POUR, NP  blood glucose meter kit and supplies Dispense based on patient and insurance preference. Use up to four times daily as directed. (FOR ICD-10 E10.9, E11.9). Patient not taking: Reported on 10/08/2023 06/05/22   Caro Harlene POUR, NP  colchicine   0.6 MG tablet TAKE 2 TABLETS BY MOUTH NOW AND  MAY REPEAT 1 TABLET IN AN HOUR  AS DIRECTED 01/03/23   Eubanks, Jessica K, NP  dapagliflozin  propanediol (FARXIGA ) 5 MG TABS tablet Take 1 tablet (5 mg total) by mouth daily before breakfast. Patient not taking: Reported on 10/08/2023 03/22/23   Caro Harlene POUR, NP  doxazosin  (CARDURA ) 8 MG tablet Take 1 tablet (8 mg total) by mouth daily. 01/02/24   Caro Harlene POUR, NP  furosemide  (LASIX ) 40 MG tablet Take 1 tablet (40 mg total) by mouth daily. APPOINTMENT DUE 02/27/24   Eubanks, Jessica K, NP  metoprolol  succinate (TOPROL -XL) 50 MG 24 hr tablet Take 1 tablet (50 mg total) by mouth daily. 02/27/24   Caro Harlene POUR, NP  Multiple Vitamins-Minerals (CENTRUM SILVER 50+MEN) TABS Take 1 tablet by mouth daily.    [provider]  Olmesartan -amLODIPine -HCTZ 20-5-12.5 MG TABS Take 1 tablet by mouth daily. 02/27/24   Caro Harlene POUR, NP  potassium chloride   (KLOR-CON  M) 10 MEQ tablet Take 1 tablet (10 mEq total) by mouth daily. 11/19/23   Caro Harlene POUR, NP  rosuvastatin  (CRESTOR ) 5 MG tablet Take 1 tablet (5 mg total) by mouth daily. 01/31/24   Caro Harlene POUR, NP     Critical care time: 24   The patient is critically ill with multiple organ system failure and requires high complexity decision making for assessment and support, frequent evaluation and titration of therapies, advanced monitoring, review of radiographic studies and interpretation of complex data.   Critical Care Time devoted to patient care services, exclusive of separately billable procedures, described in this note is 35 minutes.   Orlin Fairly, MD [facility] See Amion for pager  If no response to pager , please call [phone] until 7pm After 7:00 pm call Elink  [phone] 04/14/2024, 4:16 AM            [1]  Allergies Allergen Reactions   Allopurinol  Other (See Comments)    Welts in the body and and blisters in the mouth   Jardiance  [Empagliflozin ] Itching    Itching and joint pain    "

## 2024-04-14 NOTE — Progress Notes (Signed)
 RT assisted with intubation. PT was intubated by ED provider with 7.5 tube, 26 @ lip. No complications noted. RT will continue to monitor.   Vent Settings: PRVC  Vt 550 R 18 Peep 5 Fi02 100%

## 2024-04-14 NOTE — Progress Notes (Signed)
 PHARMACY - PHYSICIAN COMMUNICATION CRITICAL VALUE ALERT - BLOOD CULTURE IDENTIFICATION (BCID)  Douglas Soto is an 83 y.o. male who presented to [facility] on 04/14/2024 with a chief complaint of respiratory failure.  Assessment:  1 of 4 bottles staph species, likely contaminant  Name of physician (or Provider) Contacted: Dr. Jude  Current antibiotics: ceftriaxone  and azithromycin  for CAP  Changes to prescribed antibiotics recommended:  Recommendations accepted by provider - no change at this time  Results for orders placed or performed during the hospital encounter of 04/14/24  Blood Culture ID Panel (Reflexed) (Collected: 04/14/2024  1:43 AM)  Result Value Ref Range   Enterococcus faecalis NOT DETECTED NOT DETECTED   Enterococcus Faecium NOT DETECTED NOT DETECTED   Listeria monocytogenes NOT DETECTED NOT DETECTED   Staphylococcus species DETECTED (A) NOT DETECTED   Staphylococcus aureus (BCID) NOT DETECTED NOT DETECTED   Staphylococcus epidermidis NOT DETECTED NOT DETECTED   Staphylococcus lugdunensis NOT DETECTED NOT DETECTED   Streptococcus species NOT DETECTED NOT DETECTED   Streptococcus agalactiae NOT DETECTED NOT DETECTED   Streptococcus pneumoniae NOT DETECTED NOT DETECTED   Streptococcus pyogenes NOT DETECTED NOT DETECTED   A.calcoaceticus-baumannii NOT DETECTED NOT DETECTED   Bacteroides fragilis NOT DETECTED NOT DETECTED   Enterobacterales NOT DETECTED NOT DETECTED   Enterobacter cloacae complex NOT DETECTED NOT DETECTED   Escherichia coli NOT DETECTED NOT DETECTED   Klebsiella aerogenes NOT DETECTED NOT DETECTED   Klebsiella oxytoca NOT DETECTED NOT DETECTED   Klebsiella pneumoniae NOT DETECTED NOT DETECTED   Proteus species NOT DETECTED NOT DETECTED   Salmonella species NOT DETECTED NOT DETECTED   Serratia marcescens NOT DETECTED NOT DETECTED   Haemophilus influenzae NOT DETECTED NOT DETECTED   Neisseria meningitidis NOT DETECTED NOT DETECTED   Pseudomonas  aeruginosa NOT DETECTED NOT DETECTED   Stenotrophomonas maltophilia NOT DETECTED NOT DETECTED   Candida albicans NOT DETECTED NOT DETECTED   Candida auris NOT DETECTED NOT DETECTED   Candida glabrata NOT DETECTED NOT DETECTED   Candida krusei NOT DETECTED NOT DETECTED   Candida parapsilosis NOT DETECTED NOT DETECTED   Candida tropicalis NOT DETECTED NOT DETECTED   Cryptococcus neoformans/gattii NOT DETECTED NOT DETECTED    Rocky Slade, PharmD, BCPS 04/14/2024  8:46 PM

## 2024-04-14 NOTE — ED Notes (Signed)
"  Xray at bedside.   "

## 2024-04-14 NOTE — ED Provider Notes (Signed)
 INTUBATION Performed by: Lonni JULIANNA Lites  Required items: required blood products, implants, devices, and special equipment available Patient identity confirmed: provided demographic data and hospital-assigned identification number Time out: Immediately prior to procedure a time out was called to verify the correct patient, procedure, equipment, support staff and site/side marked as required.  Indications: Worsening respiratory failure  Intubation method: Glidescope Laryngoscopy   Preoxygenation: BVM  Sedatives: Etomidate  Paralytic: Succinylcholine   Tube Size:  cuffed  Post-procedure assessment: chest rise and ETCO2 monitor Breath sounds: equal and absent over the epigastrium Tube secured with: ETT holder Chest x-ray interpreted by radiologist and me.  Chest x-ray findings: endotracheal tube in appropriate position  Patient tolerated the procedure well with no immediate complications.     Lites Lonni JULIANNA, PA-C 04/14/24 0444    Bari Charmaine JULIANNA, MD 04/14/24 [phone]

## 2024-04-14 NOTE — ED Triage Notes (Signed)
 Pt BIB [facility] from home d/t fall and he was 60% O2  on RA when EMS arrived. Pt is on 12L O2 and now 97%.   Son said ABD was distended which is abnormal and also has Edema BLEs.  Pt does not know if he passed out but his knees gave out is why he fell.    BP 150/83 HR 76 RR 20 O2 60%

## 2024-04-15 ENCOUNTER — Inpatient Hospital Stay (HOSPITAL_COMMUNITY)

## 2024-04-15 DIAGNOSIS — J90 Pleural effusion, not elsewhere classified: Secondary | ICD-10-CM

## 2024-04-15 DIAGNOSIS — I509 Heart failure, unspecified: Secondary | ICD-10-CM

## 2024-04-15 DIAGNOSIS — E785 Hyperlipidemia, unspecified: Secondary | ICD-10-CM

## 2024-04-15 DIAGNOSIS — E1122 Type 2 diabetes mellitus with diabetic chronic kidney disease: Secondary | ICD-10-CM | POA: Diagnosis not present

## 2024-04-15 DIAGNOSIS — N179 Acute kidney failure, unspecified: Secondary | ICD-10-CM

## 2024-04-15 DIAGNOSIS — I130 Hypertensive heart and chronic kidney disease with heart failure and stage 1 through stage 4 chronic kidney disease, or unspecified chronic kidney disease: Principal | ICD-10-CM

## 2024-04-15 DIAGNOSIS — J9601 Acute respiratory failure with hypoxia: Secondary | ICD-10-CM

## 2024-04-15 DIAGNOSIS — I4891 Unspecified atrial fibrillation: Secondary | ICD-10-CM | POA: Diagnosis not present

## 2024-04-15 DIAGNOSIS — J9819 Other pulmonary collapse: Secondary | ICD-10-CM | POA: Diagnosis not present

## 2024-04-15 DIAGNOSIS — N1832 Chronic kidney disease, stage 3b: Secondary | ICD-10-CM

## 2024-04-15 DIAGNOSIS — I5030 Unspecified diastolic (congestive) heart failure: Secondary | ICD-10-CM

## 2024-04-15 DIAGNOSIS — J810 Acute pulmonary edema: Secondary | ICD-10-CM

## 2024-04-15 DIAGNOSIS — G9341 Metabolic encephalopathy: Secondary | ICD-10-CM

## 2024-04-15 DIAGNOSIS — N19 Unspecified kidney failure: Secondary | ICD-10-CM

## 2024-04-15 LAB — POCT I-STAT 7, (LYTES, BLD GAS, ICA,H+H)
Acid-Base Excess: 3 mmol/L — ABNORMAL HIGH (ref 0.0–2.0)
Bicarbonate: 27.3 mmol/L (ref 20.0–28.0)
Calcium, Ion: 1.18 mmol/L (ref 1.15–1.40)
HCT: 38 % — ABNORMAL LOW (ref 39.0–52.0)
Hemoglobin: 12.9 g/dL — ABNORMAL LOW (ref 13.0–17.0)
O2 Saturation: 88 %
Patient temperature: 98.7
Potassium: 3.5 mmol/L (ref 3.5–5.1)
Sodium: 142 mmol/L (ref 135–145)
TCO2: 28 mmol/L (ref 22–32)
pCO2 arterial: 39.5 mmHg (ref 32–48)
pH, Arterial: 7.448 (ref 7.35–7.45)
pO2, Arterial: 52 mmHg — ABNORMAL LOW (ref 83–108)

## 2024-04-15 LAB — GLUCOSE, CAPILLARY
Glucose-Capillary: 109 mg/dL — ABNORMAL HIGH (ref 70–99)
Glucose-Capillary: 94 mg/dL (ref 70–99)
Glucose-Capillary: 100 mg/dL — ABNORMAL HIGH (ref 70–99)
Glucose-Capillary: 121 mg/dL — ABNORMAL HIGH (ref 70–99)
Glucose-Capillary: 140 mg/dL — ABNORMAL HIGH (ref 70–99)
Glucose-Capillary: 154 mg/dL — ABNORMAL HIGH (ref 70–99)

## 2024-04-15 LAB — ECHOCARDIOGRAM COMPLETE
AR max vel: 2.95 cm2
AV Area VTI: 3.02 cm2
AV Area mean vel: 2.9 cm2
AV Mean grad: 6 mmHg
AV Peak grad: 10.6 mmHg
Ao pk vel: 1.63 m/s
Area-P 1/2: 4.21 cm2
Calc EF: 61.8 %
Height: 68 in
MV VTI: 3.11 cm2
S' Lateral: 3.1 cm
Single Plane A2C EF: 61.6 %
Single Plane A4C EF: 58.5 %
Weight: 3936.53 [oz_av]

## 2024-04-15 LAB — CBC
HCT: 38.5 % — ABNORMAL LOW (ref 39.0–52.0)
Hemoglobin: 12.7 g/dL — ABNORMAL LOW (ref 13.0–17.0)
MCH: 28.2 pg (ref 26.0–34.0)
MCHC: 33 g/dL (ref 30.0–36.0)
MCV: 85.4 fL (ref 80.0–100.0)
Platelets: 182 K/uL (ref 150–400)
RBC: 4.51 MIL/uL (ref 4.22–5.81)
RDW: 17.2 % — ABNORMAL HIGH (ref 11.5–15.5)
WBC: 11.3 K/uL — ABNORMAL HIGH (ref 4.0–10.5)
nRBC: 0 % (ref 0.0–0.2)

## 2024-04-15 LAB — URINALYSIS, W/ REFLEX TO CULTURE (INFECTION SUSPECTED)
Bilirubin Urine: NEGATIVE
Glucose, UA: NEGATIVE mg/dL
Ketones, ur: NEGATIVE mg/dL
Nitrite: NEGATIVE
Protein, ur: 100 mg/dL — AB
RBC / HPF: >50 RBC/hpf (ref 0–5)
Specific Gravity, Urine: 1.009 (ref 1.005–1.030)
pH: 5 (ref 5.0–8.0)

## 2024-04-15 LAB — BASIC METABOLIC PANEL WITH GFR
Anion gap: 15 (ref 5–15)
Anion gap: 16 — ABNORMAL HIGH (ref 5–15)
BUN: 113 mg/dL — ABNORMAL HIGH (ref 8–23)
BUN: 119 mg/dL — ABNORMAL HIGH (ref 8–23)
CO2: 27 mmol/L (ref 22–32)
CO2: 28 mmol/L (ref 22–32)
Calcium: 9.4 mg/dL (ref 8.9–10.3)
Calcium: 9.4 mg/dL (ref 8.9–10.3)
Chloride: 100 mmol/L (ref 98–111)
Chloride: 100 mmol/L (ref 98–111)
Creatinine, Ser: 5.07 mg/dL — ABNORMAL HIGH (ref 0.61–1.24)
Creatinine, Ser: 5.27 mg/dL — ABNORMAL HIGH (ref 0.61–1.24)
GFR, Estimated: 11 mL/min — ABNORMAL LOW
GFR, Estimated: 10 mL/min — ABNORMAL LOW
Glucose, Bld: 90 mg/dL (ref 70–99)
Glucose, Bld: 111 mg/dL — ABNORMAL HIGH (ref 70–99)
Potassium: 3.7 mmol/L (ref 3.5–5.1)
Potassium: 3.8 mmol/L (ref 3.5–5.1)
Sodium: 142 mmol/L (ref 135–145)
Sodium: 145 mmol/L (ref 135–145)

## 2024-04-15 LAB — MAGNESIUM: Magnesium: 2.2 mg/dL (ref 1.7–2.4)

## 2024-04-15 LAB — TSH: TSH: 2.21 u[IU]/mL (ref 0.350–4.500)

## 2024-04-15 LAB — LACTIC ACID, PLASMA: Lactic Acid, Venous: 1.8 mmol/L (ref 0.5–1.9)

## 2024-04-15 LAB — AMMONIA: Ammonia: 53 umol/L — ABNORMAL HIGH (ref 9–35)

## 2024-04-15 MED ORDER — SODIUM CHLORIDE 3 % IN NEBU
4.0000 mL | INHALATION_SOLUTION | Freq: Four times a day (QID) | RESPIRATORY_TRACT | Status: DC
  Administered 2024-04-15 – 2024-04-17 (×8): 4 mL via RESPIRATORY_TRACT
  Filled 2024-04-15 (×4): qty 15
  Filled 2024-04-15: qty 4
  Filled 2024-04-15 (×2): qty 15
  Filled 2024-04-15: qty 4

## 2024-04-15 MED ORDER — SODIUM CHLORIDE 3 % IN NEBU: 4.0000 mL | INHALATION_SOLUTION | Freq: Four times a day (QID) | RESPIRATORY_TRACT | Status: DC

## 2024-04-15 MED ORDER — ORAL CARE MOUTH RINSE: 15.0000 mL | OROMUCOSAL | Status: DC | PRN

## 2024-04-15 MED ORDER — PROSOURCE TF20 ENFIT COMPATIBL EN LIQD
60.0000 mL | Freq: Every day | ENTERAL | Status: DC
  Administered 2024-04-15 – 2024-04-16 (×2): 60 mL
  Filled 2024-04-15 (×2): qty 60

## 2024-04-15 MED ORDER — ROSUVASTATIN CALCIUM 5 MG PO TABS
5.0000 mg | ORAL_TABLET | Freq: Every day | ORAL | Status: DC
  Administered 2024-04-15 – 2024-04-16 (×2): 5 mg
  Filled 2024-04-15 (×2): qty 1

## 2024-04-15 MED ORDER — FUROSEMIDE 10 MG/ML IJ SOLN
80.0000 mg | Freq: Four times a day (QID) | INTRAMUSCULAR | Status: AC
  Administered 2024-04-15 (×2): 80 mg via INTRAVENOUS
  Filled 2024-04-15 (×2): qty 8

## 2024-04-15 MED ORDER — DOXAZOSIN MESYLATE 8 MG PO TABS
8.0000 mg | ORAL_TABLET | Freq: Every day | ORAL | Status: DC
  Administered 2024-04-15: 8 mg
  Filled 2024-04-15 (×2): qty 1

## 2024-04-15 MED ORDER — ALBUTEROL SULFATE (2.5 MG/3ML) 0.083% IN NEBU
2.5000 mg | INHALATION_SOLUTION | Freq: Four times a day (QID) | RESPIRATORY_TRACT | Status: DC
  Administered 2024-04-15 – 2024-04-17 (×8): 2.5 mg via RESPIRATORY_TRACT
  Filled 2024-04-15 (×9): qty 3

## 2024-04-15 MED ORDER — APIXABAN 2.5 MG PO TABS
2.5000 mg | ORAL_TABLET | Freq: Two times a day (BID) | ORAL | Status: DC
  Administered 2024-04-15 – 2024-04-17 (×5): 2.5 mg
  Filled 2024-04-15 (×5): qty 1

## 2024-04-15 MED ORDER — ALBUTEROL SULFATE (2.5 MG/3ML) 0.083% IN NEBU
2.5000 mg | INHALATION_SOLUTION | Freq: Four times a day (QID) | RESPIRATORY_TRACT | Status: DC
  Administered 2024-04-15: 2.5 mg via RESPIRATORY_TRACT
  Filled 2024-04-15: qty 3

## 2024-04-15 MED ORDER — METOPROLOL TARTRATE 25 MG PO TABS
25.0000 mg | ORAL_TABLET | Freq: Two times a day (BID) | ORAL | Status: DC
  Administered 2024-04-15 – 2024-04-16 (×3): 25 mg
  Filled 2024-04-15 (×3): qty 1

## 2024-04-15 MED ORDER — NICOTINE 14 MG/24HR TD PT24
14.0000 mg | MEDICATED_PATCH | Freq: Every day | TRANSDERMAL | Status: DC
  Administered 2024-04-15 – 2024-04-16 (×2): 14 mg via TRANSDERMAL
  Filled 2024-04-15 (×2): qty 1

## 2024-04-15 MED ORDER — ORAL CARE MOUTH RINSE
15.0000 mL | OROMUCOSAL | Status: DC
  Administered 2024-04-15 – 2024-04-17 (×25): 15 mL via OROMUCOSAL

## 2024-04-15 MED ORDER — VITAL HP 1.0 CAL PO LIQD
1000.0000 mL | ORAL | Status: DC
  Administered 2024-04-15: 1000 mL

## 2024-04-15 MED ORDER — LACTULOSE 10 GM/15ML PO SOLN
20.0000 g | Freq: Two times a day (BID) | ORAL | Status: DC
  Administered 2024-04-15: 20 g
  Filled 2024-04-15: qty 30

## 2024-04-15 MED ORDER — METOLAZONE 5 MG PO TABS
5.0000 mg | ORAL_TABLET | Freq: Once | ORAL | Status: AC
  Administered 2024-04-15: 5 mg
  Filled 2024-04-15: qty 1

## 2024-04-15 MED ORDER — AMLODIPINE BESYLATE 5 MG PO TABS
5.0000 mg | ORAL_TABLET | Freq: Every day | ORAL | Status: DC
  Filled 2024-04-15: qty 1

## 2024-04-15 NOTE — Progress Notes (Addendum)
 "  NAME:  Douglas Soto, MRN:  [id number], DOB:  [birth year], LOS: 1 ADMISSION DATE:  04/14/2024, CONSULTATION DATE:  04/14/2024 REFERRING MD:  Charmaine Bough, MD, CHIEF COMPLAINT:  SOB   History of Present Illness:  83 y/o male with h/o A fib on Eliquis , HTN, DMT2, CKD stage 3b, CHF, fatty Liver, Gout, Obesity, HLD, Right pleural effusion, Total nephrectomy who was BIBEMS for fall and found to be 60% on RA per EMS, he was on 12 liters on arrival to ED.  He was intubated in ED.  His Cr 4.8, BNP 4020, WBC 7.7, HgB 13.1.  Pertinent  Medical History  A fib on Eliquis , HTN, DMT2, CKD stage 3b, CHF, fatty Liver, Gout, Obesity, HLD, Right pleural effusion, Total nephrectomy   Significant Hospital Events: Including procedures, antibiotic start and stop dates in addition to other pertinent events   12/29: admit to ICU  Interim History / Subjective:  Remains intubated and sedated. Weaning FiO2.   Objective    Blood pressure 122/63, pulse 71, temperature 97.9 F (36.6 C), temperature source Axillary, resp. rate 18, height 5' 8 (1.727 m), weight 111.6 kg, SpO2 99%.    Vent Mode: PRVC FiO2 (%):  [60 %] 60 % Set Rate:  [18 bmp] 18 bmp Vt Set:  [550 mL] 550 mL PEEP:  [5 cmH20] 5 cmH20 Plateau Pressure:  [17 cmH20-23 cmH20] 17 cmH20   Intake/Output Summary (Last 24 hours) at 04/15/2024 0807 Last data filed at 04/15/2024 0800 Gross per 24 hour  Intake 449.8 ml  Output 2800 ml  Net -2350.2 ml   Filed Weights   04/14/24 0548 04/15/24 0500  Weight: 117 kg 111.6 kg    Examination: General: acute on chronically ill elderly male, resting in bed, intubated and sedated HENT: [place]/AT, sclera anicteric, PERRL Lungs: on mechanical ventilation, synchronous, coarse bilaterally, diminished bilateral bases  Cardiovascular: regular rate and rhythm, normal S1 and S2, no m/r/g  Abdomen: obese, soft, non-distended, +BS Extremities: 1-2+ pitting edema BLE, warm, palpable pedal pulses  Neuro: intubated and  sedated, PERRL GU: deferred  Resolved problem list   Assessment and Plan   Acute hypoxic respiratory failure Right pleural effusion Mucous plugging; seen on CXR last evening with total white out of right hemithorax, now improved on this morning's CXR  Possible CAP  Metabolic encephalopathy, likely due to uremia. CTH with NAICP.  - Plan for SAT/SBT today - Continue LTVV with goal DP<15 and Pplat<30 - Wean FiO2 as able to maintain SpO2>92%  - VAP bundle - PRN CXR/ABG - Right effusion is likely chronic in etiology, has been present on previous imaging, and unlikely to be contributing to current decompensation. Hold off on thoracentesis for now especially given total body volume overload - Continue IV diuresis - Schedule 3% nebs, albuterol  and chest PT - Continue ceftriaxone  and azithromycin  - Continue PAD protocol with propofol  and fentanyl    CHF; Echo 07/2020 with EF 60-65%, mild LVH, low-normal RV  Afib (on Eliquis ) HTN HLD - Follow-up Echo  - Resume fractionated home metoprolol  - Holding home olmesartan -amlodipine -hydrochlorothiazide  to allow permissive HTN for renal perfusion  - Continue home Eliquis  - Continue IV diuresis  AKI on CKD  - Continue to trend daily BMP - IV diuresis with 80 mg IV lasix  x 2  - Renally dose medications, ensure adequate renal perfusion and avoid nephrotoxins  - Not a good candidate for dialysis given heart failure and comorbidities, currently with no acute indication  Labs   CBC: Recent Labs  Lab 04/14/24 0138 04/14/24 0208 04/14/24 0220 04/14/24 0500 04/14/24 0553 04/14/24 0908 04/15/24 0640  WBC 7.7  --   --  8.6  --   --  11.3*  NEUTROABS 5.8  --   --   --   --   --   --   HGB 13.1   < > 13.6 12.1* 11.9* 12.2* 12.7*  HCT 42.6   < > 40.0 40.3 35.0* 36.0* 38.5*  MCV 93.0  --   --  93.7  --   --  85.4  PLT 166  --   --  158  --   --  182   < > = values in this interval not displayed.    Basic Metabolic Panel: Recent Labs  Lab  04/14/24 0138 04/14/24 0208 04/14/24 0220 04/14/24 0500 04/14/24 0553 04/14/24 0908 04/14/24 2001 04/15/24 0640  NA 144 142 142 143 137 142 143 142  K 4.1 3.9 3.9 4.4 3.9 3.9 3.7 3.7  CL 102 100 100 101  --   --  100 100  CO2 31  --   --  29  --   --  27 27  GLUCOSE 156* 147* 144* 165*  --   --  80 90  BUN 112* 129* 127* 111*  --   --  115* 113*  CREATININE 4.80* 4.70* 4.90* 4.82*  --   --  4.86* 5.07*  CALCIUM  9.2  --   --  9.0  --   --  9.2 9.4  MG  --   --   --  2.3  --   --   --  2.2  PHOS  --   --   --  7.0*  --   --   --   --    GFR: Estimated Creatinine Clearance: 13.6 mL/min (A) (by C-G formula based on SCr of 5.07 mg/dL (H)). Recent Labs  Lab 04/14/24 0138 04/14/24 0209 04/14/24 0500 04/14/24 0514 04/15/24 0640  PROCALCITON  --   --  0.21  --   --   WBC 7.7  --  8.6  --  11.3*  LATICACIDVEN  --  1.0  --  1.1  --     Liver Function Tests: Recent Labs  Lab 04/14/24 0138  AST 11*  ALT 6  ALKPHOS 80  BILITOT 0.7  PROT 6.4*  ALBUMIN 3.7   Recent Labs  Lab 04/14/24 0138  LIPASE 32   No results for input(s): AMMONIA in the last 168 hours.  ABG    Component Value Date/Time   PHART 7.433 04/14/2024 0908   PCO2ART 41.5 04/14/2024 0908   PO2ART 59 (L) 04/14/2024 0908   HCO3 27.9 04/14/2024 0908   TCO2 29 04/14/2024 0908   O2SAT 91 04/14/2024 0908     Coagulation Profile: Recent Labs  Lab 04/14/24 0138  INR 1.2    Cardiac Enzymes: No results for input(s): CKTOTAL, CKMB, CKMBINDEX, TROPONINI in the last 168 hours.  HbA1C: Hemoglobin A1C  Date/Time Value Ref Range Status  04/02/2018 12:00 AM 6.4  Final   Hgb A1c MFr Bld  Date/Time Value Ref Range Status  04/14/2024 12:00 PM 8.0 (H) 4.8 - 5.6 % Final    Comment:    (NOTE) Diagnosis of Diabetes The following HbA1c ranges recommended by the American Diabetes Association (ADA) may be used as an aid in the diagnosis of diabetes mellitus.  Hemoglobin             Suggested A1C  NGSP%              Diagnosis  <5.7                   Non Diabetic  5.7-6.4                Pre-Diabetic  >6.4                   Diabetic  <7.0                   Glycemic control for                       adults with diabetes.    10/08/2023 02:15 PM 9.7 (H) <5.7 % Final    Comment:    For someone without known diabetes, a hemoglobin A1c value of 6.5% or greater indicates that they may have  diabetes and this should be confirmed with a follow-up  test. . For someone with known diabetes, a value <7% indicates  that their diabetes is well controlled and a value  greater than or equal to 7% indicates suboptimal  control. A1c targets should be individualized based on  duration of diabetes, age, comorbid conditions, and  other considerations. . Currently, no consensus exists regarding use of hemoglobin A1c for diagnosis of diabetes for children. .     CBG: Recent Labs  Lab 04/14/24 1548 04/14/24 2036 04/14/24 2332 04/15/24 0402 04/15/24 0758  GLUCAP 86 83 101* 109* 94    Critical care time: 35 minutes    The patient is critically ill with multiple organ system failure and requires high complexity decision making for assessment and support, frequent evaluation and titration of therapies, advanced monitoring, review of radiographic studies and interpretation of complex data.   Critical Care Time devoted to patient care services, exclusive of separately billable procedures, described in this note is 35 minutes.  Rexene LOISE Blush, PA-C [facility] 04/15/2024 8:07 AM  Please see [email or web address] for pager details.  From 7A-7P if no response, please call [phone] After hours, please call ELink [phone]         "

## 2024-04-15 NOTE — Plan of Care (Signed)
" °  Problem: Education: Goal: Ability to describe self-care measures that may prevent or decrease complications (Diabetes Survival Skills Education) will improve Outcome: Not Progressing Goal: Individualized Educational Video(s) Outcome: Not Progressing   Problem: Coping: Goal: Ability to adjust to condition or change in health will improve Outcome: Not Progressing   Problem: Fluid Volume: Goal: Ability to maintain a balanced intake and output will improve Outcome: Not Progressing   Problem: Health Behavior/Discharge Planning: Goal: Ability to identify and utilize available resources and services will improve Outcome: Not Progressing Goal: Ability to manage health-related needs will improve Outcome: Not Progressing   Problem: Metabolic: Goal: Ability to maintain appropriate glucose levels will improve Outcome: Not Progressing   Problem: Nutritional: Goal: Maintenance of adequate nutrition will improve Outcome: Not Progressing Goal: Progress toward achieving an optimal weight will improve Outcome: Not Progressing   Problem: Skin Integrity: Goal: Risk for impaired skin integrity will decrease Outcome: Not Progressing   Problem: Tissue Perfusion: Goal: Adequacy of tissue perfusion will improve Outcome: Not Progressing   Problem: Safety: Goal: Non-violent Restraint(s) Outcome: Not Progressing   Problem: Education: Goal: Knowledge of General Education information will improve Description: Including pain rating scale, medication(s)/side effects and non-pharmacologic comfort measures Outcome: Not Progressing   Problem: Health Behavior/Discharge Planning: Goal: Ability to manage health-related needs will improve Outcome: Not Progressing   Problem: Clinical Measurements: Goal: Ability to maintain clinical measurements within normal limits will improve Outcome: Not Progressing Goal: Will remain free from infection Outcome: Not Progressing Goal: Diagnostic test results will  improve Outcome: Not Progressing Goal: Respiratory complications will improve Outcome: Not Progressing Goal: Cardiovascular complication will be avoided Outcome: Not Progressing   Problem: Activity: Goal: Risk for activity intolerance will decrease Outcome: Not Progressing   Problem: Nutrition: Goal: Adequate nutrition will be maintained Outcome: Not Progressing   Problem: Coping: Goal: Level of anxiety will decrease Outcome: Not Progressing   Problem: Elimination: Goal: Will not experience complications related to bowel motility Outcome: Not Progressing Goal: Will not experience complications related to urinary retention Outcome: Not Progressing   Problem: Pain Managment: Goal: General experience of comfort will improve and/or be controlled Outcome: Not Progressing   Problem: Safety: Goal: Ability to remain free from injury will improve Outcome: Not Progressing   Problem: Skin Integrity: Goal: Risk for impaired skin integrity will decrease Outcome: Not Progressing   Problem: Activity: Goal: Ability to tolerate increased activity will improve Outcome: Not Progressing   Problem: Respiratory: Goal: Ability to maintain a clear airway and adequate ventilation will improve Outcome: Not Progressing   Problem: Role Relationship: Goal: Method of communication will improve Outcome: Not Progressing   "

## 2024-04-15 NOTE — Progress Notes (Signed)
" °  Echocardiogram 2D Echocardiogram has been performed.  Douglas Soto 04/15/2024, 10:43 AM "

## 2024-04-16 ENCOUNTER — Encounter (HOSPITAL_COMMUNITY): Payer: Self-pay

## 2024-04-16 ENCOUNTER — Inpatient Hospital Stay (HOSPITAL_COMMUNITY)

## 2024-04-16 DIAGNOSIS — J9601 Acute respiratory failure with hypoxia: Secondary | ICD-10-CM | POA: Diagnosis not present

## 2024-04-16 DIAGNOSIS — N179 Acute kidney failure, unspecified: Secondary | ICD-10-CM

## 2024-04-16 DIAGNOSIS — R609 Edema, unspecified: Secondary | ICD-10-CM | POA: Diagnosis not present

## 2024-04-16 DIAGNOSIS — Z7189 Other specified counseling: Secondary | ICD-10-CM

## 2024-04-16 DIAGNOSIS — N189 Chronic kidney disease, unspecified: Secondary | ICD-10-CM

## 2024-04-16 DIAGNOSIS — E440 Moderate protein-calorie malnutrition: Secondary | ICD-10-CM | POA: Insufficient documentation

## 2024-04-16 DIAGNOSIS — I5030 Unspecified diastolic (congestive) heart failure: Secondary | ICD-10-CM

## 2024-04-16 DIAGNOSIS — J90 Pleural effusion, not elsewhere classified: Secondary | ICD-10-CM | POA: Diagnosis not present

## 2024-04-16 LAB — BASIC METABOLIC PANEL WITH GFR
Anion gap: 16 — ABNORMAL HIGH (ref 5–15)
Anion gap: 15 (ref 5–15)
BUN: 124 mg/dL — ABNORMAL HIGH (ref 8–23)
BUN: 127 mg/dL — ABNORMAL HIGH (ref 8–23)
CO2: 27 mmol/L (ref 22–32)
CO2: 25 mmol/L (ref 22–32)
Calcium: 9.2 mg/dL (ref 8.9–10.3)
Calcium: 9.1 mg/dL (ref 8.9–10.3)
Chloride: 99 mmol/L (ref 98–111)
Chloride: 100 mmol/L (ref 98–111)
Creatinine, Ser: 5.4 mg/dL — ABNORMAL HIGH (ref 0.61–1.24)
Creatinine, Ser: 5.37 mg/dL — ABNORMAL HIGH (ref 0.61–1.24)
GFR, Estimated: 10 mL/min — ABNORMAL LOW
GFR, Estimated: 10 mL/min — ABNORMAL LOW
Glucose, Bld: 202 mg/dL — ABNORMAL HIGH (ref 70–99)
Glucose, Bld: 237 mg/dL — ABNORMAL HIGH (ref 70–99)
Potassium: 3.4 mmol/L — ABNORMAL LOW (ref 3.5–5.1)
Potassium: 4.1 mmol/L (ref 3.5–5.1)
Sodium: 143 mmol/L (ref 135–145)
Sodium: 140 mmol/L (ref 135–145)

## 2024-04-16 LAB — POCT I-STAT 7, (LYTES, BLD GAS, ICA,H+H)
Acid-Base Excess: 5 mmol/L — ABNORMAL HIGH (ref 0.0–2.0)
Bicarbonate: 28.6 mmol/L — ABNORMAL HIGH (ref 20.0–28.0)
Calcium, Ion: 1.15 mmol/L (ref 1.15–1.40)
HCT: 37 % — ABNORMAL LOW (ref 39.0–52.0)
Hemoglobin: 12.6 g/dL — ABNORMAL LOW (ref 13.0–17.0)
O2 Saturation: 94 %
Patient temperature: 97.6
Potassium: 4.8 mmol/L (ref 3.5–5.1)
Sodium: 140 mmol/L (ref 135–145)
TCO2: 30 mmol/L (ref 22–32)
pCO2 arterial: 38.2 mmHg (ref 32–48)
pH, Arterial: 7.481 — ABNORMAL HIGH (ref 7.35–7.45)
pO2, Arterial: 64 mmHg — ABNORMAL LOW (ref 83–108)

## 2024-04-16 LAB — GLUCOSE, CAPILLARY
Glucose-Capillary: 169 mg/dL — ABNORMAL HIGH (ref 70–99)
Glucose-Capillary: 193 mg/dL — ABNORMAL HIGH (ref 70–99)
Glucose-Capillary: 207 mg/dL — ABNORMAL HIGH (ref 70–99)
Glucose-Capillary: 198 mg/dL — ABNORMAL HIGH (ref 70–99)
Glucose-Capillary: 177 mg/dL — ABNORMAL HIGH (ref 70–99)
Glucose-Capillary: 209 mg/dL — ABNORMAL HIGH (ref 70–99)

## 2024-04-16 LAB — PROCALCITONIN: Procalcitonin: 0.8 ng/mL

## 2024-04-16 LAB — MAGNESIUM: Magnesium: 2.3 mg/dL (ref 1.7–2.4)

## 2024-04-16 LAB — URINE CULTURE: Culture: NO GROWTH

## 2024-04-16 LAB — CBC
HCT: 38.7 % — ABNORMAL LOW (ref 39.0–52.0)
Hemoglobin: 13 g/dL (ref 13.0–17.0)
MCH: 29.1 pg (ref 26.0–34.0)
MCHC: 33.6 g/dL (ref 30.0–36.0)
MCV: 86.8 fL (ref 80.0–100.0)
Platelets: 174 K/uL (ref 150–400)
RBC: 4.46 MIL/uL (ref 4.22–5.81)
RDW: 17.4 % — ABNORMAL HIGH (ref 11.5–15.5)
WBC: 12.5 K/uL — ABNORMAL HIGH (ref 4.0–10.5)
nRBC: 0 % (ref 0.0–0.2)

## 2024-04-16 LAB — CULTURE, BLOOD (ROUTINE X 2): Special Requests: ADEQUATE

## 2024-04-16 LAB — PHOSPHORUS: Phosphorus: 6.1 mg/dL — ABNORMAL HIGH (ref 2.5–4.6)

## 2024-04-16 LAB — TRIGLYCERIDES: Triglycerides: 116 mg/dL

## 2024-04-16 LAB — AMMONIA: Ammonia: 21 umol/L (ref 9–35)

## 2024-04-16 MED ORDER — VITAL 1.5 CAL PO LIQD
1000.0000 mL | ORAL | Status: DC
  Administered 2024-04-16: 1000 mL
  Filled 2024-04-16: qty 1000

## 2024-04-16 MED ORDER — INSULIN ASPART 100 UNIT/ML IJ SOLN
0.0000 [IU] | INTRAMUSCULAR | Status: DC
  Administered 2024-04-16: 3 [IU] via SUBCUTANEOUS
  Administered 2024-04-16: 5 [IU] via SUBCUTANEOUS
  Administered 2024-04-16 – 2024-04-17 (×3): 3 [IU] via SUBCUTANEOUS
  Administered 2024-04-17: 5 [IU] via SUBCUTANEOUS
  Filled 2024-04-16: qty 5
  Filled 2024-04-16: qty 3
  Filled 2024-04-16: qty 1
  Filled 2024-04-16 (×2): qty 3

## 2024-04-16 MED ORDER — GUAIFENESIN 100 MG/5ML PO LIQD
5.0000 mL | Freq: Four times a day (QID) | ORAL | Status: DC
  Administered 2024-04-16 – 2024-04-17 (×4): 5 mL
  Filled 2024-04-16 (×4): qty 10

## 2024-04-16 MED ORDER — EPINEPHRINE PF 1 MG/ML IJ SOLN
0.2000 mg | Freq: Once | INTRAMUSCULAR | Status: AC
  Administered 2024-04-16: 0.2 mg via INTRAVENOUS

## 2024-04-16 MED ORDER — NOREPINEPHRINE 4 MG/250ML-% IV SOLN: 0.0000 ug/min | INTRAVENOUS | Status: DC

## 2024-04-16 MED ORDER — NOREPINEPHRINE 4 MG/250ML-% IV SOLN
INTRAVENOUS | Status: AC
  Administered 2024-04-16: 5 ug/min via INTRAVENOUS
  Filled 2024-04-16: qty 250

## 2024-04-16 MED ORDER — FUROSEMIDE 10 MG/ML IJ SOLN
20.0000 mg/h | INTRAVENOUS | Status: DC
  Administered 2024-04-16: 20 mg/h via INTRAVENOUS
  Filled 2024-04-16: qty 20

## 2024-04-16 MED ORDER — NOREPINEPHRINE 4 MG/250ML-% IV SOLN
0.0000 ug/min | INTRAVENOUS | Status: DC
  Filled 2024-04-16: qty 250

## 2024-04-16 MED ORDER — SODIUM CHLORIDE 0.9 % IV SOLN
250.0000 mL | INTRAVENOUS | Status: DC
  Administered 2024-04-16: 250 mL via INTRAVENOUS

## 2024-04-16 MED ORDER — METOLAZONE 5 MG PO TABS
10.0000 mg | ORAL_TABLET | Freq: Once | ORAL | Status: AC
  Administered 2024-04-16: 10 mg
  Filled 2024-04-16: qty 2

## 2024-04-16 MED ORDER — INSULIN GLARGINE 100 UNIT/ML ~~LOC~~ SOLN
10.0000 [IU] | Freq: Every day | SUBCUTANEOUS | Status: DC
  Administered 2024-04-16 – 2024-04-17 (×2): 10 [IU] via SUBCUTANEOUS
  Filled 2024-04-16 (×2): qty 0.1

## 2024-04-16 MED ORDER — FUROSEMIDE 10 MG/ML IJ SOLN
120.0000 mg | Freq: Four times a day (QID) | INTRAVENOUS | Status: DC
  Administered 2024-04-16: 120 mg via INTRAVENOUS
  Filled 2024-04-16: qty 10
  Filled 2024-04-16: qty 12

## 2024-04-16 MED ORDER — GUAIFENESIN 100 MG/5ML PO LIQD: 5.0000 mL | Freq: Four times a day (QID) | ORAL | Status: DC

## 2024-04-16 MED ORDER — FUROSEMIDE 10 MG/ML IJ SOLN
120.0000 mg | Freq: Once | INTRAVENOUS | Status: AC
  Administered 2024-04-16: 120 mg via INTRAVENOUS
  Filled 2024-04-16: qty 120

## 2024-04-16 MED ORDER — GUAIFENESIN ER 600 MG PO TB12: 600.0000 mg | ORAL_TABLET | Freq: Two times a day (BID) | ORAL | Status: DC

## 2024-04-16 MED ORDER — POTASSIUM CHLORIDE 20 MEQ PO PACK
40.0000 meq | PACK | Freq: Once | ORAL | Status: AC
  Administered 2024-04-16: 40 meq
  Filled 2024-04-16: qty 2

## 2024-04-16 MED ORDER — EPINEPHRINE 0.1 MG/10ML (10 MCG/ML) SYRINGE FOR IV PUSH (FOR BLOOD PRESSURE SUPPORT): 2.0000 ug | PREFILLED_SYRINGE | Freq: Once | INTRAVENOUS | Status: DC | PRN

## 2024-04-16 MED ORDER — THIAMINE MONONITRATE 100 MG PO TABS
100.0000 mg | ORAL_TABLET | Freq: Every day | ORAL | Status: DC
  Administered 2024-04-16: 100 mg
  Filled 2024-04-16: qty 1

## 2024-04-16 NOTE — Consult Note (Signed)
 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  Patient seen in the ICU with 3 of his children at bedside.  He had been living with one of his sons who notes that he had had subacute decline over the past months, sleeping much of the day and much less active and physically engaged.  In the past 2 days the patient has been treated for acute exacerbation of HFpEF with increasing doses of bolus and now continuous furosemide  accompanied by metolazone .  He is now intubated requiring significant FiO2.  Blood pressures have been stable intermittently requiring low-dose norepinephrine.  Only 1.2 L urine output yesterday with this diuretic regimen, so far only net -1.5 L from admission.  Only 0.14 L urine output documented thus far today.  Currently on Lasix  drip at 20 mg an hour.  Baseline creatinine appears to be around 1.7-2.2.  It has progressively worsened since presentation to evaluate 5.4 today.  K of 4.1 most recently, BUN 127.  No imaging of the urinary system.  Urinalysis at presentation with 6-10 RBCs per high-powered field, 6-10 leukocytes as well.  On olmesartan  at home.   Creat (mg/dL)  Date Value  93/76/7974 2.18 (H)  10/08/2023 2.18 (H)  04/09/2023 2.21 (H)  08/28/2022 2.30 (H)  05/03/2022 1.80 (H)  10/12/2021 1.64 (H)  05/02/2021 1.62 (H)  03/03/2021 1.91 (H)  10/26/2020 1.80 (H)  08/06/2020 1.71 (H)   Creatinine, Ser (mg/dL)  Date Value  87/68/7974 5.37 (H)  04/16/2024 5.40 (H)  04/15/2024 5.27 (H)  04/15/2024 5.07 (H)  04/14/2024 4.86 (H)  04/14/2024 4.82 (H)  04/14/2024 4.90 (H)  04/14/2024  4.70 (H)  04/14/2024 4.80 (H)  04/13/2022 1.67 (H)  ] I/Os: I/O last 3 completed shifts: In: 1403.6 [I.V.:256.5; NG/GT:692.7; IV Piggyback:454.4] Out: 3150 [Urine:2750; Emesis/NG output:400]   ROS NSAIDS: no exposure IV Contrast no exposure TMP/SMX no exposure Hypotension not present Balance of 12 systems is negative w/ exceptions as above  PMH  Past Medical History:  Diagnosis Date   Cholelithiasis 02/2018   uncomplicated.     Chronic kidney disease, stage II (mild)    Depression    Diabetes mellitus without complication (HCC)    Essential hypertension, malignant    Fatty liver 02/2018   on ultrasound   Gout, unspecified    Hyperlipidemia    Lumbago    Obesity, unspecified    Pain in joint, pelvic region and thigh    Pleural effusion on right 02/2018   PSH  Past Surgical History:  Procedure Laterality Date   APPENDECTOMY     COLONOSCOPY WITH PROPOFOL  N/A 02/22/2018   Procedure: COLONOSCOPY WITH PROPOFOL ;  Surgeon: Teressa Toribio SQUIBB, MD;  Location: [facility] ENDOSCOPY;  Service: Endoscopy;  Laterality: N/A;   CYST EXCISION  1998   back   ESOPHAGOGASTRODUODENOSCOPY (EGD) WITH PROPOFOL  N/A 02/22/2018   Procedure: ESOPHAGOGASTRODUODENOSCOPY (EGD) WITH PROPOFOL ;  Surgeon: Teressa Toribio SQUIBB, MD;  Location: [facility] ENDOSCOPY;  Service: Endoscopy;  Laterality: N/A;   POLYPECTOMY  02/22/2018   Procedure: POLYPECTOMY;  Surgeon: Teressa Toribio SQUIBB, MD;  Location: [facility] ENDOSCOPY;  Service: Endoscopy;;   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 His smokeless tobacco use includes chew. He reports current alcohol use of about 1.0 standard drink of alcohol per week. He reports that he does not use drugs. Allergies Allergies[1] Home  medications Prior to Admission medications  Medication Sig Start Date End Date Taking? Authorizing Provider  apixaban  (ELIQUIS ) 2.5 MG TABS tablet Take 1 tablet (2.5 mg total) by mouth 2 (two) times daily. 09/11/23  Yes Caro Harlene POUR, NP  colchicine  0.6 MG tablet TAKE 2 TABLETS BY MOUTH NOW AND  MAY REPEAT 1 TABLET IN AN HOUR  AS DIRECTED 01/03/23  Yes Eubanks, Jessica K, NP  doxazosin  (CARDURA ) 8 MG tablet Take 1 tablet (8 mg total) by mouth daily. 01/02/24  Yes Caro Harlene POUR, NP  EPINEPHrine (PRIMATENE MIST) 0.125 MG/ACT AERO Inhale 1-2 puffs into the lungs every 6 (six) hours as needed (SOB/Wheezing).   Yes [provider]  furosemide  (LASIX ) 40 MG tablet Take 1 tablet (40 mg total) by mouth daily. APPOINTMENT DUE 02/27/24  Yes Eubanks, Jessica K, NP  metoprolol  succinate (TOPROL -XL) 50 MG 24 hr tablet Take 1 tablet (50 mg total) by mouth daily. 02/27/24  Yes Caro Harlene POUR, NP  Olmesartan -amLODIPine -HCTZ 20-5-12.5 MG TABS Take 1 tablet by mouth daily. 02/27/24  Yes Caro Harlene POUR, NP  potassium chloride  (KLOR-CON  M) 10 MEQ tablet Take 1 tablet (10 mEq total) by mouth daily. 11/19/23  Yes Caro Harlene POUR, NP  rosuvastatin  (CRESTOR ) 5 MG tablet Take 1 tablet (5 mg total) by mouth daily. 01/31/24  Yes Eubanks, Jessica K, NP    Current Medications Scheduled Meds:  albuterol   2.5 mg Nebulization Q6H   apixaban   2.5 mg Per Tube BID   Chlorhexidine  Gluconate Cloth  6 each Topical Daily   docusate  100 mg Per Tube BID   famotidine   20 mg Per Tube Daily   feeding supplement (PROSource TF20)  60 mL Per Tube Daily   guaiFENesin   5 mL Per Tube Q6H   insulin  aspart  0-15 Units Subcutaneous Q4H   insulin  glargine  10 Units Subcutaneous Daily   metolazone   10 mg Per Tube Once   metoprolol  tartrate  25 mg Per Tube BID   nicotine   14 mg Transdermal Daily   mouth rinse  15 mL Mouth Rinse Q2H   polyethylene glycol  17 g Per Tube Daily   rosuvastatin   5 mg Per Tube Daily    sodium chloride  HYPERTONIC  4 mL Nebulization Q6H   thiamine  100 mg Per Tube Daily   Continuous Infusions:  sodium chloride  Stopped (04/16/24 1245)   cefTRIAXone  (ROCEPHIN )  IV Stopped (04/16/24 1136)   feeding supplement (VITAL 1.5 CAL) 50 mL/hr at 04/16/24 1446   norepinephrine (LEVOPHED) Adult infusion 1 mcg/min (04/16/24 1301)   propofol  (DIPRIVAN ) infusion 30 mcg/kg/min (04/16/24 1425)   PRN Meds:.fentaNYL  (SUBLIMAZE ) injection, fentaNYL  (SUBLIMAZE ) injection, ondansetron  (ZOFRAN ) IV, mouth rinse  CBC Recent Labs  Lab 04/14/24 0138 04/14/24 0208 04/14/24 0500 04/14/24 0553 04/15/24 0640 04/15/24 1234 04/16/24 0414 04/16/24 1252  WBC 7.7  --  8.6  --  11.3*  --  12.5*  --   NEUTROABS 5.8  --   --   --   --   --   --   --  HGB 13.1   < > 12.1*   < > 12.7* 12.9* 13.0 12.6*  HCT 42.6   < > 40.3   < > 38.5* 38.0* 38.7* 37.0*  MCV 93.0  --  93.7  --  85.4  --  86.8  --   PLT 166  --  158  --  182  --  174  --    < > = values in this interval not displayed.   Basic Metabolic Panel Recent Labs  Lab 04/14/24 0138 04/14/24 0208 04/14/24 0220 04/14/24 0500 04/14/24 0553 04/14/24 2001 04/15/24 0640 04/15/24 1234 04/15/24 1439 04/16/24 0414 04/16/24 1252 04/16/24 1416  NA 144   < > 142 143   < > 143 142 142 145 143 140 140  K 4.1   < > 3.9 4.4   < > 3.7 3.7 3.5 3.8 3.4* 4.8 4.1  CL 102   < > 100 101  --  100 100  --  100 99  --  100  CO2 31  --   --  29  --  27 27  --  28 27  --  25  GLUCOSE 156*   < > 144* 165*  --  80 90  --  111* 202*  --  237*  BUN 112*   < > 127* 111*  --  115* 113*  --  119* 124*  --  127*  CREATININE 4.80*   < > 4.90* 4.82*  --  4.86* 5.07*  --  5.27* 5.40*  --  5.37*  CALCIUM  9.2  --   --  9.0  --  9.2 9.4  --  9.4 9.2  --  9.1  PHOS  --   --   --  7.0*  --   --   --   --   --  6.1*  --   --    < > = values in this interval not displayed.    Physical Exam  Blood pressure 115/62, pulse 75, temperature 97.6 F (36.4 C), temperature source  Oral, resp. rate 18, height 5' 8 (1.727 m), weight 108.8 kg, SpO2 95%. 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 Hypervolemia with acute on chronic HFpEF exacerbation and suboptimal diuresis despite escalating doses AHRF, ventilator dependent, Acute on chronic HFpEF exacerbation  Plan At length discussed current status in the context of his subacute decline and age with his comorbidities.  Discussed the potential value of short or long-term hemodialysis and that it is unlikely that he would be restored towards an acceptable level of health even with these aggressive measures.  I have recommended against dialysis which the family agrees to.  They also seem to be ready to transition to comfort measures.  Have discussed with CCM MD Dr. Theodoro who will follow-up with the family.  At this time would recommend continue diuretics and additional goals of care discussions.   Douglas Soto  [phone] pgr 04/16/2024, 3:53 PM        [1]  Allergies Allergen Reactions   Allopurinol  Other (See Comments)    Welts  in the body and and blisters in the mouth   Jardiance  [Empagliflozin ] Itching    Itching and joint pain

## 2024-04-16 NOTE — Progress Notes (Signed)
 "  NAME:  Douglas Soto, MRN:  [id number], DOB:  09-Mar-1941, LOS: 2 ADMISSION DATE:  04/14/2024, CONSULTATION DATE:  04/14/2024 REFERRING MD:  Charmaine Bough, MD, CHIEF COMPLAINT:  SOB   History of Present Illness:  83 y/o male with h/o A fib on Eliquis , HTN, DMT2, CKD stage 3b, CHF, fatty Liver, Gout, Obesity, HLD, Right pleural effusion, Total nephrectomy who was BIBEMS for fall and found to be 60% on RA per EMS, he was on 12 liters on arrival to ED.  He was intubated in ED.  His Cr 4.8, BNP 4020, WBC 7.7, HgB 13.1.  Pertinent  Medical History  A fib on Eliquis , HTN, DMT2, CKD stage 3b, CHF, fatty Liver, Gout, Obesity, HLD, Right pleural effusion, Total nephrectomy   Significant Hospital Events: Including procedures, antibiotic start and stop dates in addition to other pertinent events   12/29: admit to ICU 12/30: remained intubated, hypoxia on SBT with PaO2 in the 50's, poor response to diuresis with worsening BUN/Cr   Interim History / Subjective:  Remains intubated and sedated. PaO2 in 50's on FiO2 50% on SBT yesterday so decision was made to keep intubated.  Objective    Blood pressure (!) 149/95, pulse 74, temperature 97.6 F (36.4 C), temperature source Oral, resp. rate 11, height 5' 8 (1.727 m), weight 108.8 kg, SpO2 99%.    Vent Mode: PRVC FiO2 (%):  [40 %-50 %] 40 % Set Rate:  [18 bmp] 18 bmp Vt Set:  [550 mL] 550 mL PEEP:  [5 cmH20] 5 cmH20 Pressure Support:  [10 cmH20] 10 cmH20 Plateau Pressure:  [16 cmH20-17 cmH20] 16 cmH20   Intake/Output Summary (Last 24 hours) at 04/16/2024 0848 Last data filed at 04/16/2024 9364 Gross per 24 hour  Intake 1130.21 ml  Output 1030 ml  Net 100.21 ml   Filed Weights   04/14/24 0548 04/15/24 0500 04/16/24 0500  Weight: 117 kg 111.6 kg 108.8 kg    Examination: General: acute on chronically ill appearing male, intubated and sedation paused  HENT: [place]/AT, sclera anicteric, PERRL Lungs: on mechanical ventilation, coarse,  diminished bilateral bases Cardiovascular: irregularly irregular, normal S1 and S2 Abdomen: obese, soft, non-distended, +BS Extremities: 1-2+ pitting edema BLE, warm, dry  Neuro: with sedation paused, opens eyes spontaneously, follows commands x 4 GU: deferred  Resolved problem list   Assessment and Plan   Acute hypoxic respiratory failure, likely multifactorial  Right pleural effusion Mucous plugging with right lung collapse, resolved Possible CAP  Metabolic encephalopathy, likely due to uremia. CTH with NAICP.  - Plan for SAT/SBT today - Continue LTVV with goal DP<15 and Pplat<30 - Wean FiO2 as able to maintain SpO2>92%  - VAP bundle - PRN CXR/ABG - Right effusion is likely chronic in etiology, has been present on previous imaging, and unlikely to be contributing to current decompensation. Hold off on thoracentesis for now especially given total body volume overload - Continue IV diuresis - Schedule 3% nebs, albuterol  and chest PT - Continue ceftriaxone  and azithromycin  to complete 5 day course - Continue PAD protocol with propofol  and fentanyl    CHF; Echo 04/15/2024 EF 55-60%, normal RV, moderately elevated PASP, LA severely dilated, RA moderately dilated, moderate TR  Afib (on Eliquis ) HTN HLD - Continue fractionated home metoprolol  - Holding home olmesartan -amlodipine -hydrochlorothiazide  to allow permissive HTN for renal perfusion  - Continue home Eliquis  - Continue IV diuresis  AKI on CKD, worsening. Marginal UOP with 80 mg IV Lasix  x 2 yesterday and 5 metolazone . Remains volume up  on exam.  Uremia  - Continue to trend daily BMP - IV diuresis with 120 mg IV Lasix  and Lasix  gtt  - Renally dose medications, ensure adequate renal perfusion and avoid nephrotoxins  - Not a good candidate for dialysis given heart failure and comorbidities, currently with no acute indication. Palliative care has been consulted.   Leukocytosis 1/2 blood cultures staph hemolyticus; likely a  contaminant Afebrile, hemodynamically stable. Second set of blood cultures no growth to date. - continue antibiotics per above  - send pro-calcitonin   Labs   CBC: Recent Labs  Lab 04/14/24 0138 04/14/24 0208 04/14/24 0500 04/14/24 0553 04/14/24 0908 04/15/24 0640 04/15/24 1234 04/16/24 0414  WBC 7.7  --  8.6  --   --  11.3*  --  12.5*  NEUTROABS 5.8  --   --   --   --   --   --   --   HGB 13.1   < > 12.1* 11.9* 12.2* 12.7* 12.9* 13.0  HCT 42.6   < > 40.3 35.0* 36.0* 38.5* 38.0* 38.7*  MCV 93.0  --  93.7  --   --  85.4  --  86.8  PLT 166  --  158  --   --  182  --  174   < > = values in this interval not displayed.    Basic Metabolic Panel: Recent Labs  Lab 04/14/24 0500 04/14/24 0553 04/14/24 2001 04/15/24 0640 04/15/24 1234 04/15/24 1439 04/16/24 0414  NA 143   < > 143 142 142 145 143  K 4.4   < > 3.7 3.7 3.5 3.8 3.4*  CL 101  --  100 100  --  100 99  CO2 29  --  27 27  --  28 27  GLUCOSE 165*  --  80 90  --  111* 202*  BUN 111*  --  115* 113*  --  119* 124*  CREATININE 4.82*  --  4.86* 5.07*  --  5.27* 5.40*  CALCIUM  9.0  --  9.2 9.4  --  9.4 9.2  MG 2.3  --   --  2.2  --   --  2.3  PHOS 7.0*  --   --   --   --   --  6.1*   < > = values in this interval not displayed.   GFR: Estimated Creatinine Clearance: 12.6 mL/min (A) (by C-G formula based on SCr of 5.4 mg/dL (H)). Recent Labs  Lab 04/14/24 0138 04/14/24 0209 04/14/24 0500 04/14/24 0514 04/15/24 0640 04/15/24 1802 04/16/24 0414  PROCALCITON  --   --  0.21  --   --   --   --   WBC 7.7  --  8.6  --  11.3*  --  12.5*  LATICACIDVEN  --  1.0  --  1.1  --  1.8  --     Liver Function Tests: Recent Labs  Lab 04/14/24 0138  AST 11*  ALT 6  ALKPHOS 80  BILITOT 0.7  PROT 6.4*  ALBUMIN 3.7   Recent Labs  Lab 04/14/24 0138  LIPASE 32   Recent Labs  Lab 04/15/24 1103 04/16/24 0414  AMMONIA 53* 21    ABG    Component Value Date/Time   PHART 7.448 04/15/2024 1234   PCO2ART 39.5  04/15/2024 1234   PO2ART 52 (L) 04/15/2024 1234   HCO3 27.3 04/15/2024 1234   TCO2 28 04/15/2024 1234   O2SAT 88 04/15/2024 1234  Coagulation Profile: Recent Labs  Lab 04/14/24 0138  INR 1.2    Cardiac Enzymes: No results for input(s): CKTOTAL, CKMB, CKMBINDEX, TROPONINI in the last 168 hours.  HbA1C: Hemoglobin A1C  Date/Time Value Ref Range Status  04/02/2018 12:00 AM 6.4  Final   Hgb A1c MFr Bld  Date/Time Value Ref Range Status  04/14/2024 12:00 PM 8.0 (H) 4.8 - 5.6 % Final    Comment:    (NOTE) Diagnosis of Diabetes The following HbA1c ranges recommended by the American Diabetes Association (ADA) may be used as an aid in the diagnosis of diabetes mellitus.  Hemoglobin             Suggested A1C NGSP%              Diagnosis  <5.7                   Non Diabetic  5.7-6.4                Pre-Diabetic  >6.4                   Diabetic  <7.0                   Glycemic control for                       adults with diabetes.    10/08/2023 02:15 PM 9.7 (H) <5.7 % Final    Comment:    For someone without known diabetes, a hemoglobin A1c value of 6.5% or greater indicates that they may have  diabetes and this should be confirmed with a follow-up  test. . For someone with known diabetes, a value <7% indicates  that their diabetes is well controlled and a value  greater than or equal to 7% indicates suboptimal  control. A1c targets should be individualized based on  duration of diabetes, age, comorbid conditions, and  other considerations. . Currently, no consensus exists regarding use of hemoglobin A1c for diagnosis of diabetes for children. .     CBG: Recent Labs  Lab 04/15/24 1524 04/15/24 1945 04/15/24 2323 04/16/24 0314 04/16/24 0731  GLUCAP 121* 140* 154* 169* 193*    Critical care time: 38 minutes    The patient is critically ill with multiple organ system failure and requires high complexity decision making for assessment and  support, frequent evaluation and titration of therapies, advanced monitoring, review of radiographic studies and interpretation of complex data.   Critical Care Time devoted to patient care services, exclusive of separately billable procedures, described in this note is 38 minutes.  Rexene LOISE Blush, PA-C [facility] 04/16/2024 8:48 AM  Please see [email or web address] for pager details.  From 7A-7P if no response, please call [phone] After hours, please call ELink [phone]         "

## 2024-04-16 NOTE — Progress Notes (Addendum)
 eLink Physician-Brief Progress Note Patient Name: Douglas Soto DOB: [birth year] MRN: [id number]   Date of Service  04/16/2024  HPI/Events of Note  Minimal response to 80 mg of Lasix   eICU Interventions  Increase Lasix  dose to 120, may consider infusion if no response   0621 - kcl  Intervention Category Intermediate Interventions: Oliguria - evaluation and management  [name] 04/16/2024, 2:17 AM

## 2024-04-16 NOTE — IPAL (Signed)
" °  Interdisciplinary Goals of Care Family Meeting   Date carried out: 04/16/2024  Location of the meeting: Bedside  Member's involved: Physician and Family Member or next of kin daughter, [name] and son, Maximiliano Cromartie Power of Attorney or environmental health practitioner: daughter, Laneta and son, Marcey   Discussion: We discussed goals of care for [employer]. Patient's renal function is worsening and he has not been making good urine output despite IV diuresis. Family told me upon discussion with nephrology they decided they would not want long or short term dialysis. They tell me that if he were able to speak for himself he would say I do not want this. We discussed that he is currently unable to come off the ventilator and this is likely only to worsen as fluid accumulates with kidney failure. We discussed next steps of comfort care and extubation. For now they would like to continue current support and IV vasopressors just until brother can come tomorrow to say goodbye. Tomorrow they will plan to transition to comfort care when all family is gathered at bedside. If he were to worsen in the interim they would likely transition to comfort sooner.   Code status:   Code Status: Do not attempt resuscitation (DNR) PRE-ARREST INTERVENTIONS DESIRED   Disposition: Continue current acute care until tomorrow morning and then transition to comfort   Time spent for the meeting: 8 minutes     Rexene LOISE Blush, PA-C  04/16/2024, 4:41 PM   "

## 2024-04-16 NOTE — Plan of Care (Signed)
" °  Problem: Fluid Volume: Goal: Ability to maintain a balanced intake and output will improve Outcome: Progressing   Problem: Metabolic: Goal: Ability to maintain appropriate glucose levels will improve Outcome: Progressing   Problem: Nutritional: Goal: Maintenance of adequate nutrition will improve Outcome: Progressing   Problem: Skin Integrity: Goal: Risk for impaired skin integrity will decrease Outcome: Progressing   Problem: Clinical Measurements: Goal: Ability to maintain clinical measurements within normal limits will improve Outcome: Progressing Goal: Respiratory complications will improve Outcome: Progressing Goal: Cardiovascular complication will be avoided Outcome: Progressing   Problem: Elimination: Goal: Will not experience complications related to bowel motility Outcome: Progressing   Problem: Skin Integrity: Goal: Risk for impaired skin integrity will decrease Outcome: Progressing   "

## 2024-04-16 NOTE — Procedures (Signed)
 Intubation Procedure Note  Douglas Soto  [id number]  11-19-1940  Date:04/16/2024  Time:2:52 PM   Provider Performing:Shalaine Payson    Procedure: Intubation (31500)  Indication(s) Respiratory Failure  Consent Unable to obtain consent due to emergent nature of procedure.   Anesthesia None.    Time Out Verified patient identification, verified procedure, site/side was marked, verified correct patient position, special equipment/implants available, medications/allergies/relevant history reviewed, required imaging and test results available.   Sterile Technique Usual hand hygeine, masks, and gloves were used   Procedure Description Patient positioned in bed supine.  Patient's previous ET tube was clogged.  Please see notes from before.  Bronc was advanced to the previous ET tube to check the ET tube position.  It was appropriate.  Bronc was removed and a bougie was placed in the previous ET tube.  The old ET tube was subsequently removed and a new ET tube after checking the balloon and lubricating it was passed over the bougie.  The ET tube was placed to a ventilator with good respiratory waveforms.   Complications/Tolerance None; patient tolerated the procedure well. Chest x-ray ordered.   EBL Minimal   Specimen(s) None

## 2024-04-16 NOTE — Procedures (Signed)
 Arterial Line Insertion  Preanesthetic checklist: patient identified, IV checked, site marked, risks and benefits discussed, monitors and equipment checked and timeout performed Left, radial was placed Catheter size: 20 G Hand hygiene performed  and maximum sterile barriers used  Allen's test indicative of satisfactory collateral circulation Attempts: 1 Following insertion, dressing applied and Biopatch. Post procedure assessment: normal  Patient tolerated the procedure well with no immediate complications.

## 2024-04-16 NOTE — Progress Notes (Signed)
 Lower extremity venous duplex completed. Please see CV Procedures for preliminary results.  Garnette Rockers, RVT 04/16/2024 2:54 PM

## 2024-04-16 NOTE — Procedures (Signed)
 Bronchoscopy Procedure Note  Douglas Soto  [id number]  November 19, 1940  Date:04/16/2024  Time:2:35 PM   Provider Performing:Renezmae Canlas   Procedure(s):  Flexible Bronchoscopy (68377)  Indication(s) High peak pressures not receiving tidal volumes.  Consent Unable to obtain consent due to emergent nature of procedure.  Anesthesia None.   Time Out None.  Sterile Technique Usual hand hygiene, masks, gowns, and gloves were used   Procedure Description Bronchoscope advanced through endotracheal tube.  There was a lot of secretions on the inside of the tube.  After using saline flushes this was cleared and suction.  Bronc was subsequently advanced into the trachea.  The trachea was clear and there was no obstruction.  After confirming position of the ET tube the ET tube was exchanged using a bougie.  Please see a separate procedure note.  The bronc was removed.  Complications/Tolerance None; patient tolerated the procedure well. Chest X-ray is not needed post procedure.   EBL Minimal   Specimen(s) None.

## 2024-04-16 NOTE — TOC Initial Note (Signed)
 Transition of Care [name] - Initial/Assessment Note    Patient Details  Name: Douglas Soto MRN: [id number] Date of Birth: Jan 12, 1941  Transition of Care [name] CM/SW Contact:    Douglas Soto, LCSWA Phone Number: 04/16/2024, 8:55 AM  Clinical Narrative:  Pt is intubated. CSW spoke to Douglas Soto (daughter) via phone to complete assessment. CSW introduced self and role.  Pt is from home w/ son Douglas Soto, has the DME of a cane, has seen PCP in the last year, HCPOA is Douglas Soto, and needs assist w/ ADL's. Family provides transportation.  Douglas stated the pt has no SDOH concerns and is able to afford basic needs through income of retirement and SSI.  Douglas reports no hx of HH/STR or MH/SU. Douglas Soto stated that the pt has the need for a walker and railing by the front steps of their house but stated that the pt has hx of refusing the need for medical equipment and has a hx of falls.  No current TOC needs. Please place consult for any further needs.             Expected Discharge Plan: Home/Self Care Barriers to Discharge: Continued Medical Work up   Patient Goals and CMS Choice Patient states their goals for this hospitalization and ongoing recovery are:: pt has artificial airway   Choice offered to / list presented to : NA      Expected Discharge Plan and Services In-house Referral: Clinical Social Work     Living arrangements for the past 2 months: Single Family Home                                      Prior Living Arrangements/Services Living arrangements for the past 2 months: Single Family Home Lives with:: Adult Children Patient language and need for interpreter reviewed:: No        Need for Family Participation in Patient Care: Yes (Comment) Care giver support system in place?: Yes (comment) Current home services: DME Criminal Activity/Legal Involvement Pertinent to Current Situation/Hospitalization: No - Comment as needed  Activities of Daily Living       Permission Sought/Granted Permission sought to share information with : Family Supports Permission granted to share information with : No  Share Information with NAME: Douglas Soto     Permission granted to share info w Relationship: Daughter  Permission granted to share info w Contact Information: [phone]  Emotional Assessment Appearance:: Appears stated age Attitude/Demeanor/Rapport: Unable to Assess Affect (typically observed): Unable to Assess Orientation: :  (intubated/tach) Alcohol / Substance Use: Not Applicable Psych Involvement: No (comment)  Admission diagnosis:  Acute pulmonary edema (HCC) [J81.0] Uremia [N19] Acute respiratory failure with hypoxia (HCC) [J96.01] AKI (acute kidney injury) [N17.9] Acute hypoxic respiratory failure (HCC) [J96.01] Patient Active Problem List   Diagnosis Date Noted   Uremia 04/15/2024   Acute hypoxic respiratory failure (HCC) 04/14/2024   Class 1 obesity due to excess calories with serious comorbidity and body mass index (BMI) of 33.0 to 33.9 in adult 10/08/2023   Congestive heart failure (HCC) 07/30/2020   Polyp of descending colon    Essential hypertension 02/20/2018   CKD (chronic kidney disease) stage 3, GFR 30-59 ml/min (HCC)    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 [place] [address] Phone: [phone] Fax: [phone]  [id number] [place], [place] - [address] AT [facility] OF [address] [place] [address] Phone: [phone] Fax: [phone]  [facility] - [address], [place] - [address] [place] [address] Phone: [phone] Fax: [phone]  [place] [facility] [address] [place] [address] Phone: [phone] Fax: [phone]     Social Drivers of Health (SDOH) Social History: SDOH Screenings   Food Insecurity: No Food Insecurity (04/15/2024)  Housing: Low Risk (04/15/2024)  Transportation Needs: No Transportation Needs (04/15/2024)  Utilities: Not At Risk (04/15/2024)  Depression (PHQ2-9): Low Risk (04/09/2023)  Social Connections: Unknown (04/15/2024)  Tobacco Use: High Risk (10/08/2023)   SDOH Interventions:     Readmission Risk Interventions     No data to display

## 2024-04-16 NOTE — Progress Notes (Signed)
 Initial Nutrition Assessment  DOCUMENTATION CODES:   Non-severe (moderate) malnutrition in context of chronic illness  INTERVENTION:  Advance tube feeding via OGT: Transition to Vital 1.5 at 20 ml/h and advance by 10ml q12h to goal rate of 63ml/hr (1200 ml per day) Prosource TF20 60 ml once daily Provides 1800 kcal, 81 gm protein, 917 ml free water daily  Monitor magnesium , potassium, and phosphorus daily for at least 3 days, MD to replete as needed, as pt is at risk for refeeding syndrome given presence of malnutrition and unknown oral intake PTA.   Add Thiamine 100 mg daily for 7 days  NUTRITION DIAGNOSIS:   Moderate Malnutrition related to chronic illness (DM2, CKD 3b, CHF) as evidenced by moderate fat depletion, severe muscle depletion.  GOAL:   Patient will meet greater than or equal to 90% of their needs  MONITOR:   Vent status, Labs, Weight trends, TF tolerance, I & O's  REASON FOR ASSESSMENT:   Consult, Ventilator Enteral/tube feeding initiation and management  ASSESSMENT:   Pt admitted with SOB. PMH significant for afib on Eliquis , HTN, DM2, CKD stage 3b, CHF, fatty liver, HLD, right pleural effusion, total nephrectomy.  12/29: admitted to ICU, intubated 12/30: hypoxic on SBT; poor response to diuresis  Patient is currently intubated on ventilator support MV: 9.7 L/min Temp (24hrs), Avg:98.2 F (36.8 C), Min:97.5 F (36.4 C), Max:98.7 F (37.1 C)  SOB on admission likely r/t to pleural effusion, mucous plugging with right lung collapse (resolved) and possible CAP.  Metabolic encephalopathy r/t uremia.   Poor candidate for HD given heart failure and comorbidities.  Palliative Care consulted.   Checked in with patient at bedside. No family/visitors present at time of visit.  RN at bedside. Levo added this morning. Most recent MAP 58.   pt with bowel movement + diarrhea following.  Abdomen appears distended and taut.  Discussed with CCM who is suspects  a degree of edema impacting abdominal exam. Amenable to tube feed advancement.   Tube feeds infusing via OGT- Vital HP at 54ml/hr No reported emesis. Will adjust tube feeds to better meet nutrition needs and advance slowly to promote tolerance to goal nutrition and in the setting of addition of pressor support.   Limited weights on file to review within the last year.  Wt Readings from Last 3 Encounters:  04/16/24 108.8 kg  10/08/23 99.9 kg  04/09/23 93.4 kg  Based on above, pt's weight has been trending up though question whether this is r/t fluid retention/pleural effusion/edema versus true weight increase. Will monitor throughout admission.   Admit weight: 117 kg Current weight: 108.8 kg Nursing edema documentation: moderate generalize, BUE; deep pitting BLE  Medications: colace BID, pepcid  daily, SSI 0-15 units q4h, lantus  10 units daily, miralax  daily Drips: IV abx x2 Lasix   Levo @ 5 mcg/min propofol  @ 14.74ml/hr  Labs:  Potassium 3.4 BUN 124 Cr 5.40 Anion gap 16 Phosphorus 6.1 GFR 10 CBG's 100-193 x24 hours HgbA1c 8.0%  NUTRITION - FOCUSED PHYSICAL EXAM: Flowsheet Row Most Recent Value  Orbital Region Moderate depletion  Upper Arm Region Severe depletion  Thoracic and Lumbar Region Unable to assess  [edema]  Buccal Region Unable to assess  Temple Region Moderate depletion  Clavicle Bone Region Severe depletion  Clavicle and Acromion Bone Region Severe depletion  Scapular Bone Region Unable to assess  Dorsal Hand Moderate depletion  Patellar Region Severe depletion  Anterior Thigh Region Severe depletion  Posterior Calf Region Unable to assess  [edema moderate]  Edema (RD Assessment) Moderate  Hair Reviewed  Eyes Unable to assess  Mouth Unable to assess  Skin Reviewed  Nails Reviewed    Diet Order:   Diet Order             Diet NPO time specified  Diet effective now                   EDUCATION NEEDS:  No education needs have been identified at  this time  Skin:  Skin Assessment: Skin Integrity Issues: Skin Integrity Issues:: DTI DTI: right heel  Last BM:  12/31 type 7 large, distended, hypoactive  Height:  Ht Readings from Last 1 Encounters:  04/14/24 5' 8 (1.727 m)    Weight:  Wt Readings from Last 1 Encounters:  04/16/24 108.8 kg    Ideal Body Weight:  70 kg  BMI:  Body mass index is 36.47 kg/m.  Estimated Nutritional Needs:   Kcal:  1700-1900  Protein:  90-105g  Fluid:  >/=1.7L  Royce Maris, RDN, LDN Clinical Nutrition See AMiON for contact information.

## 2024-04-16 NOTE — Progress Notes (Signed)
 CSW attempted to contact pt's daughter Laneta via phone to complete initial assessment. No answer was given and the CSW left a VM.   CSW will re-attempt.

## 2024-04-17 DIAGNOSIS — J90 Pleural effusion, not elsewhere classified: Secondary | ICD-10-CM | POA: Diagnosis not present

## 2024-04-17 DIAGNOSIS — N179 Acute kidney failure, unspecified: Secondary | ICD-10-CM | POA: Diagnosis not present

## 2024-04-17 DIAGNOSIS — J9601 Acute respiratory failure with hypoxia: Secondary | ICD-10-CM | POA: Diagnosis not present

## 2024-04-17 DIAGNOSIS — I5030 Unspecified diastolic (congestive) heart failure: Secondary | ICD-10-CM | POA: Diagnosis not present

## 2024-04-17 LAB — GLUCOSE, CAPILLARY
Glucose-Capillary: 185 mg/dL — ABNORMAL HIGH (ref 70–99)
Glucose-Capillary: 205 mg/dL — ABNORMAL HIGH (ref 70–99)

## 2024-04-17 MED ORDER — ACETAMINOPHEN 325 MG PO TABS: 650.0000 mg | ORAL_TABLET | Freq: Four times a day (QID) | ORAL | Status: DC | PRN

## 2024-04-17 MED ORDER — LORAZEPAM 2 MG/ML IJ SOLN: 2.0000 mg | INTRAMUSCULAR | Status: DC | PRN

## 2024-04-17 MED ORDER — FENTANYL BOLUS VIA INFUSION
100.0000 ug | INTRAVENOUS | Status: DC | PRN
  Administered 2024-04-17: 100 ug via INTRAVENOUS

## 2024-04-17 MED ORDER — GLYCOPYRROLATE 0.2 MG/ML IJ SOLN
0.2000 mg | INTRAMUSCULAR | Status: DC | PRN
  Filled 2024-04-17: qty 1

## 2024-04-17 MED ORDER — FENTANYL 2500MCG IN NS 250ML (10MCG/ML) PREMIX INFUSION
0.0000 ug/h | INTRAVENOUS | Status: DC
  Administered 2024-04-17: 50 ug/h via INTRAVENOUS
  Filled 2024-04-17: qty 250

## 2024-04-17 MED ORDER — ACETAMINOPHEN 650 MG RE SUPP: 650.0000 mg | Freq: Four times a day (QID) | RECTAL | Status: DC | PRN

## 2024-04-17 MED ORDER — POLYVINYL ALCOHOL 1.4 % OP SOLN: 1.0000 [drp] | Freq: Four times a day (QID) | OPHTHALMIC | Status: DC | PRN

## 2024-04-17 MED ORDER — GLYCOPYRROLATE 1 MG PO TABS: 1.0000 mg | ORAL_TABLET | ORAL | Status: DC | PRN

## 2024-04-17 MED ORDER — SODIUM CHLORIDE 0.9 % IV SOLN: INTRAVENOUS | Status: DC

## 2024-04-17 MED ORDER — GLYCOPYRROLATE 0.2 MG/ML IJ SOLN: 0.2000 mg | INTRAMUSCULAR | Status: DC | PRN

## 2024-04-17 DEATH — deceased

## 2024-04-19 LAB — CULTURE, BLOOD (ROUTINE X 2)
Culture: NO GROWTH
Culture: NO GROWTH
Culture: NO GROWTH
Special Requests: ADEQUATE
Special Requests: ADEQUATE
Special Requests: ADEQUATE

## 2024-05-18 NOTE — Progress Notes (Signed)
Post mortuum care completed.

## 2024-05-18 NOTE — Progress Notes (Signed)
" °   05-02-24 1117  Attending Physican Contact  Attending Physician Notified Y  Attending Physician (First and Last Name) Delorise Fredericks  Post Mortem Checklist  Date of Death May 02, 2024  Time of Death 07-May-1115  Pronounced By Francina Pepper RN, Rankin Christians, MD  Next of kin notified Yes  Name of next of kin notified of death [name]  Contact Person's Relationship to Patient Daughter  Contact Person's Phone Number [phone]  Contact Person's address [address] [place], [place] [address]  Was the patient a No Code Blue or a Limited Code Blue? Yes  Did the patient die unattended? No  Patient restrained (physical/manual hold/chemical)? *See row information* Not applicable  [employer] (previously known as [facility])  Notification Date 2024/05/02  Notification Time 1136  [employer] Number [phone]  Is patient a potential donor? N  Autopsy  Autopsy requested by MD or Family ( Non ME Case) N/A  Patient and Hospital Property Returned  Patient is satisfied that all belongings have been returned? Not applicable  Dead on Arrival (Emergency Department)  Patient dead on arrival? No  Medical Examiner  Is this a medical examiner's case? N  Funeral Home  Funeral home name/address/phone # [facility] unknown/ undecided  Planned location of pickup [place]    "

## 2024-05-18 NOTE — Progress Notes (Signed)
 Brief nephrology progress note: Plan for transition to comfort measures here today.  Brother at bedside, I briefly said hello and answered all questions.  Nephrology will step back, please call for any questions or concerns.

## 2024-05-18 NOTE — Progress Notes (Signed)
" °   04/29/2024 1332  Notifications  Patient Placement notified that Post Mortem checklist is complete Yes  Patient Placement notified body transferred Transported to morgue     [facility] Notified.  "

## 2024-05-18 NOTE — Discharge Summary (Signed)
Physician Discharge Summary         Patient ID: Douglas Soto MRN: [id number] DOB/AGE: 83-Oct-1942 83 y.o.  Admit date: 04/14/2024 Discharge date: 04-20-2024  Discharge Diagnoses:    Active Hospital Problems   Diagnosis Date Noted   Acute hypoxic respiratory failure (HCC) 04/14/2024   Malnutrition of moderate degree 04/16/2024   Goals of care, counseling/discussion 04/16/2024   Acute respiratory failure with hypoxia (HCC) 04/16/2024   Uremia 04/15/2024   AKI (acute kidney injury)     Resolved Hospital Problems  No resolved problems to display.      Discharge summary   84 year old male with A-fib hypertension DM CKD CHF total nephrectomy presented with hypoxemia.  He was subsequently intubated.  He was treated for acute hypoxemic respiratory failure, right lung collapse secondary to mucous plug possible pneumonia.  He was treated with antibiotic, positive pressure ventilation and nebs to help with lung collapse.  He also had CHF and fluid overload and was treated with diuresis.  He had worsening renal function and was not a candidate for long-term dialysis.  After family discussion the patient was made comfort measures only and was palliatively extubated.  He passed away peacefully on 20-Apr-2024 at 11:17 AM. Please refer to my progress note from 1/1 for complete details.   Discharge Plan by Active Problems    Patient deceased.   Significant Hospital tests/ studies  Patient deceased.  Procedures   No significant procedure except for ICU procedures.  Culture data/antimicrobials   None.   Consults  Nephrology.  Discharge Exam: BP (!) 0/0   Pulse (!) 0   Temp 98.4 F (36.9 C)   Resp (!) 0   Ht 5' 8 (1.727 m)   Wt 108.8 kg   SpO2 (!) 0%   BMI 36.47 kg/m  General: Sedated and intubated. Lungs: Rales posteriorly. Heart: regular rate rhythm, no murmur appreciated.  Abdomen: non tender, non distended. Normal BS.  Neuro: Sedated and intubated.  Labs at discharge    Lab Results  Component Value Date   CREATININE 5.37 (H) 04/16/2024   BUN 127 (H) 04/16/2024   NA 140 04/16/2024   K 4.1 04/16/2024   CL 100 04/16/2024   CO2 25 04/16/2024   Lab Results  Component Value Date   WBC 12.5 (H) 04/16/2024   HGB 12.6 (L) 04/16/2024   HCT 37.0 (L) 04/16/2024   MCV 86.8 04/16/2024   PLT 174 04/16/2024   Lab Results  Component Value Date   ALT 6 04/14/2024   AST 11 (L) 04/14/2024   ALKPHOS 80 04/14/2024   BILITOT 0.7 04/14/2024   Lab Results  Component Value Date   INR 1.2 04/14/2024   INR 0.93 02/19/2018    Current radiological studies    DG CHEST PORT 1 VIEW Result Date: 20-Apr-2024 EXAM: 1 VIEW(S) XRAY OF THE CHEST 04/16/2024 03:04:00 PM COMPARISON: 04/16/2024 CLINICAL HISTORY: Endotracheally intubated FINDINGS: LINES, TUBES AND DEVICES: Endotracheal tube in place with tip 4.5 cm above the carina. Enteric tube in stomach, tip not included in field of view. LUNGS AND PLEURA: Moderate right pleural effusion. Complete collapse of right middle and lower lobes. Small left pleural effusion. Left basilar opacities. No pneumothorax. Similar overall appearance to the lungs as yesterday. HEART AND MEDIASTINUM: Aortic atherosclerosis. No acute abnormality of the cardiac and mediastinal silhouettes. BONES AND SOFT TISSUES: No acute osseous abnormality. IMPRESSION: 1. Endotracheal tube and enteric tube in appropriate positions. 2. Moderate right pleural effusion and small left pleural effusion. 3.  Complete collapse of right middle and lower lobes and left basilar opacities. Electronically signed by: Lonni Necessary MD 05/07/2024 08:47 AM EST RP Workstation: [id number]   VAS US  LOWER EXTREMITY VENOUS (DVT) Result Date: 04/16/2024  Lower Venous DVT Study Patient Name:  Douglas Soto  Date of Exam:   04/16/2024 Medical Rec #: [id number]       Accession #:    [id number] Date of Birth: [birth year]       Patient Gender: M Patient Age:   89 years Exam Location:   [address] Procedure:      VAS US  LOWER EXTREMITY VENOUS (DVT) Referring Phys: REXENE BLUSH --------------------------------------------------------------------------------  Indications: Edema.  Risk Factors: Immobility obesity. Anticoagulation: Eliquis . Comparison Study: None. Performing Technologist: Garnette Rockers  Examination Guidelines: A complete evaluation includes B-mode imaging, spectral Doppler, color Doppler, and power Doppler as needed of all accessible portions of each vessel. Bilateral testing is considered an integral part of a complete examination. Limited examinations for reoccurring indications may be performed as noted. The reflux portion of the exam is performed with the patient in reverse Trendelenburg.  +---------+---------------+---------+-----------+----------+--------------+ RIGHT    CompressibilityPhasicitySpontaneityPropertiesThrombus Aging +---------+---------------+---------+-----------+----------+--------------+ CFV      Full           Yes      Yes                                 +---------+---------------+---------+-----------+----------+--------------+ SFJ      Full                                                        +---------+---------------+---------+-----------+----------+--------------+ FV Prox  Full                                                        +---------+---------------+---------+-----------+----------+--------------+ FV Mid   Full           Yes      Yes                                 +---------+---------------+---------+-----------+----------+--------------+ FV DistalFull                                                        +---------+---------------+---------+-----------+----------+--------------+ PFV      Full                                                        +---------+---------------+---------+-----------+----------+--------------+ POP      Full           Yes      Yes                                  +---------+---------------+---------+-----------+----------+--------------+  PTV      Full                    Yes                                 +---------+---------------+---------+-----------+----------+--------------+ PERO     Full                    Yes                                 +---------+---------------+---------+-----------+----------+--------------+   +---------+---------------+---------+-----------+----------+--------------+ LEFT     CompressibilityPhasicitySpontaneityPropertiesThrombus Aging +---------+---------------+---------+-----------+----------+--------------+ CFV      Full           Yes      Yes                                 +---------+---------------+---------+-----------+----------+--------------+ SFJ      Full                                                        +---------+---------------+---------+-----------+----------+--------------+ FV Prox  Full                                                        +---------+---------------+---------+-----------+----------+--------------+ FV Mid   Full           Yes      Yes                                 +---------+---------------+---------+-----------+----------+--------------+ FV DistalFull                                                        +---------+---------------+---------+-----------+----------+--------------+ PFV      Full                                                        +---------+---------------+---------+-----------+----------+--------------+ POP      Full           Yes      Yes                                 +---------+---------------+---------+-----------+----------+--------------+ PTV      Full                    Yes                                 +---------+---------------+---------+-----------+----------+--------------+  PERO     Full                    Yes                                  +---------+---------------+---------+-----------+----------+--------------+     Summary: BILATERAL: - No evidence of deep vein thrombosis seen in the lower extremities, bilaterally. -No evidence of popliteal cyst, bilaterally.   *See table(s) above for measurements and observations. Electronically signed by Penne Colorado MD on 04/16/2024 at 5:02:41 PM.    Final    DG CHEST PORT 1 VIEW Result Date: 04/16/2024 CLINICAL DATA:  Acute hypoxic respiratory failure. EXAM: PORTABLE CHEST 1 VIEW COMPARISON:  Radiograph yesterday FINDINGS: Endotracheal tube is 2.2 cm from the carina. Enteric tube tip below the diaphragm not included in the field of view. The patient is rotated, allowing for this suspect progressive volume loss in the right hemithorax with increasing basilar opacity. Left pleural effusion and basilar opacity is similar. Mediastinal contours are obscured. No pneumothorax. IMPRESSION: 1. Endotracheal tube 2.2 cm from the carina. 2. Suspect progressive volume loss in the right hemithorax with increasing basilar opacity. 3. Left pleural effusion and basilar opacity is similar. Electronically Signed   By: Andrea Gasman M.D.   On: 04/16/2024 12:28    Disposition:  Deceased.   Signed: Parley Pidcock 04/22/2024, 3:30 PM

## 2024-05-18 NOTE — Progress Notes (Signed)
Per CCM order, RT extubated pt to comfort care/room air.

## 2024-05-18 NOTE — Progress Notes (Signed)
"  NAME:  Douglas Soto, MRN:  [id number], DOB:  [birth year], LOS: 3 ADMISSION DATE:  04/14/2024, CONSULTATION DATE:  04/14/2024 REFERRING MD:  Charmaine Bough, MD, CHIEF COMPLAINT:  SOB   History of Present Illness:  85 y/o male with h/o A fib on Eliquis , HTN, DMT2, CKD stage 3b, CHF, fatty Liver, Gout, Obesity, HLD, Right pleural effusion, Total nephrectomy who was BIBEMS for fall and found to be 60% on RA per EMS, he was on 12 liters on arrival to ED.  He was intubated in ED.  His Cr 4.8, BNP 4020, WBC 7.7, HgB 13.1.  Pertinent  Medical History  A fib on Eliquis , HTN, DMT2, CKD stage 3b, CHF, fatty Liver, Gout, Obesity, HLD, Right pleural effusion, Total nephrectomy   Significant Hospital Events: Including procedures, antibiotic start and stop dates in addition to other pertinent events   12/29: admit to ICU 12/30: remained intubated, hypoxia on SBT with PaO2 in the 50's, poor response to diuresis with worsening BUN/Cr  1/1: comfort measures. Extubate.   Interim History / Subjective:  Remains intubated and sedated. PaO2 in 50's on FiO2 50% on SBT yesterday so decision was made to keep intubated.  Objective    Blood pressure (!) 111/40, pulse 94, temperature 98.2 F (36.8 C), resp. rate 18, height 5' 8 (1.727 m), weight 108.8 kg, SpO2 99%.    Vent Mode: AC FiO2 (%):  [50 %-60 %] 50 % Set Rate:  [18 bmp] 18 bmp Vt Set:  [55 mL-550 mL] 550 mL PEEP:  [8 cmH20] 8 cmH20 Plateau Pressure:  [13 cmH20-17 cmH20] 15 cmH20   Intake/Output Summary (Last 24 hours) at 05/10/2024 1008 Last data filed at 04/24/2024 0537 Gross per 24 hour  Intake 1201.91 ml  Output 715 ml  Net 486.91 ml   Filed Weights   04/14/24 0548 04/15/24 0500 04/16/24 0500  Weight: 117 kg 111.6 kg 108.8 kg    Examination: General: Sedated and intubated. Lungs: Rales posteriorly. Heart: regular rate rhythm, no murmur appreciated.  Abdomen: non tender, non distended. Normal BS.  Neuro: Sedated and  intubated.  Resolved problem list   Assessment and Plan   Acute hypoxic respiratory failure, likely multifactorial  Right pleural effusion Mucous plugging with right lung collapse, resolved Possible CAP  Metabolic encephalopathy, likely due to uremia. CTH with NAICP.  - Currently on vent.  Patient has been made comfort care.  Will extubate the patient. -Will place on fentanyl  drip along with as needed boluses and as needed Versed. -Will stop IV antibiotics and diuresis.  CHF; Echo 04/15/2024 EF 55-60%, normal RV, moderately elevated PASP, LA severely dilated, RA moderately dilated, moderate TR  Afib (on Eliquis ) HTN HLD - Stop all meds and focus on comfort.  AKI on CKD, worsening. Marginal UOP with 80 mg IV Lasix  x 2 yesterday and 5 metolazone . Remains volume up on exam.  Uremia  - No more interventions.  Stop all meds and focus on comfort.  We have had prolonged discussion with multiple family members including both his sons and his daughter.  All the family members are in agreement that the patient would not want this quality of life.  This discussion has happened over the last 2 days and they were waiting on someone performing numbers department today.  The patient has been made comfort measures only and plan is to extubate the patient today.  Labs   CBC: Recent Labs  Lab 04/14/24 0138 04/14/24 0208 04/14/24 0500 04/14/24 0553 04/14/24 0908 04/15/24  [id number] 04/15/24 1234 04/16/24 0414 04/16/24 1252  WBC 7.7  --  8.6  --   --  11.3*  --  12.5*  --   NEUTROABS 5.8  --   --   --   --   --   --   --   --   HGB 13.1   < > 12.1*   < > 12.2* 12.7* 12.9* 13.0 12.6*  HCT 42.6   < > 40.3   < > 36.0* 38.5* 38.0* 38.7* 37.0*  MCV 93.0  --  93.7  --   --  85.4  --  86.8  --   PLT 166  --  158  --   --  182  --  174  --    < > = values in this interval not displayed.    Basic Metabolic Panel: Recent Labs  Lab 04/14/24 0500 04/14/24 0553 04/14/24 2001 04/15/24 0640  04/15/24 1234 04/15/24 1439 04/16/24 0414 04/16/24 1252 04/16/24 1416  NA 143   < > 143 142 142 145 143 140 140  K 4.4   < > 3.7 3.7 3.5 3.8 3.4* 4.8 4.1  CL 101  --  100 100  --  100 99  --  100  CO2 29  --  27 27  --  28 27  --  25  GLUCOSE 165*  --  80 90  --  111* 202*  --  237*  BUN 111*  --  115* 113*  --  119* 124*  --  127*  CREATININE 4.82*  --  4.86* 5.07*  --  5.27* 5.40*  --  5.37*  CALCIUM  9.0  --  9.2 9.4  --  9.4 9.2  --  9.1  MG 2.3  --   --  2.2  --   --  2.3  --   --   PHOS 7.0*  --   --   --   --   --  6.1*  --   --    < > = values in this interval not displayed.   GFR: Estimated Creatinine Clearance: 12.7 mL/min (A) (by C-G formula based on SCr of 5.37 mg/dL (H)). Recent Labs  Lab 04/14/24 0138 04/14/24 0209 04/14/24 0500 04/14/24 0514 04/15/24 0640 04/15/24 1802 04/16/24 0414  PROCALCITON  --   --  0.21  --   --   --  0.80  WBC 7.7  --  8.6  --  11.3*  --  12.5*  LATICACIDVEN  --  1.0  --  1.1  --  1.8  --     Liver Function Tests: Recent Labs  Lab 04/14/24 0138  AST 11*  ALT 6  ALKPHOS 80  BILITOT 0.7  PROT 6.4*  ALBUMIN 3.7   Recent Labs  Lab 04/14/24 0138  LIPASE 32   Recent Labs  Lab 04/15/24 1103 04/16/24 0414  AMMONIA 53* 21    ABG    Component Value Date/Time   PHART 7.481 (H) 04/16/2024 1252   PCO2ART 38.2 04/16/2024 1252   PO2ART 64 (L) 04/16/2024 1252   HCO3 28.6 (H) 04/16/2024 1252   TCO2 30 04/16/2024 1252   O2SAT 94 04/16/2024 1252     Coagulation Profile: Recent Labs  Lab 04/14/24 0138  INR 1.2    Cardiac Enzymes: No results for input(s): CKTOTAL, CKMB, CKMBINDEX, TROPONINI in the last 168 hours.  HbA1C: Hemoglobin A1C  Date/Time Value Ref Range Status  04/02/2018 12:00 AM 6.4  Final   Hgb A1c MFr Bld  Date/Time Value Ref Range Status  04/14/2024 12:00 PM 8.0 (H) 4.8 - 5.6 % Final    Comment:    (NOTE) Diagnosis of Diabetes The following HbA1c ranges recommended by the  American Diabetes Association (ADA) may be used as an aid in the diagnosis of diabetes mellitus.  Hemoglobin             Suggested A1C NGSP%              Diagnosis  <5.7                   Non Diabetic  5.7-6.4                Pre-Diabetic  >6.4                   Diabetic  <7.0                   Glycemic control for                       adults with diabetes.    10/08/2023 02:15 PM 9.7 (H) <5.7 % Final    Comment:    For someone without known diabetes, a hemoglobin A1c value of 6.5% or greater indicates that they may have  diabetes and this should be confirmed with a follow-up  test. . For someone with known diabetes, a value <7% indicates  that their diabetes is well controlled and a value  greater than or equal to 7% indicates suboptimal  control. A1c targets should be individualized based on  duration of diabetes, age, comorbid conditions, and  other considerations. . Currently, no consensus exists regarding use of hemoglobin A1c for diagnosis of diabetes for children. .     CBG: Recent Labs  Lab 04/16/24 1538 04/16/24 1926 04/16/24 2317 04/26/2024 0311 04/23/2024 0751  GLUCAP 198* 177* 209* 185* 205*    CRITICAL CARE Performed by: Sammi JONETTA Fredericks.   Total critical care time: 45 minutes   Critical care time was exclusive of separately billable procedures and treating other patients.   Critical care was necessary to treat or prevent imminent or life-threatening deterioration.   Critical care was time spent personally by me on the following activities: development of treatment plan with patient and/or surrogate as well as nursing, discussions with consultants, evaluation of patient's response to treatment, examination of patient, obtaining history from patient or surrogate, ordering and performing treatments and interventions, ordering and review of laboratory studies, ordering and review of radiographic studies, pulse oximetry, re-evaluation of patient's condition  and participation in multidisciplinary rounds.  Sammi JONETTA Fredericks, MD Pulmonary, Critical Care and Sleep Attending.   05/12/2024, 10:11 AM          "

## 2024-05-18 DEATH — deceased
